# Patient Record
Sex: Male | Born: 1940 | Race: Black or African American | Hispanic: No | Marital: Married | Smoking: Former smoker
Health system: Southern US, Community
[De-identification: ages and names within clinical notes are randomized; demographics above are authoritative.]

## PROBLEM LIST (undated history)

## (undated) DIAGNOSIS — M5137 Other intervertebral disc degeneration, lumbosacral region: Secondary | ICD-10-CM

## (undated) DIAGNOSIS — I509 Heart failure, unspecified: Secondary | ICD-10-CM

## (undated) DIAGNOSIS — Z8711 Personal history of peptic ulcer disease: Secondary | ICD-10-CM

## (undated) DIAGNOSIS — K5730 Diverticulosis of large intestine without perforation or abscess without bleeding: Secondary | ICD-10-CM

## (undated) DIAGNOSIS — R011 Cardiac murmur, unspecified: Secondary | ICD-10-CM

## (undated) DIAGNOSIS — Z8719 Personal history of other diseases of the digestive system: Secondary | ICD-10-CM

## (undated) DIAGNOSIS — C61 Malignant neoplasm of prostate: Secondary | ICD-10-CM

## (undated) DIAGNOSIS — D696 Thrombocytopenia, unspecified: Secondary | ICD-10-CM

## (undated) DIAGNOSIS — F411 Generalized anxiety disorder: Secondary | ICD-10-CM

## (undated) DIAGNOSIS — R0609 Other forms of dyspnea: Secondary | ICD-10-CM

## (undated) DIAGNOSIS — M1990 Unspecified osteoarthritis, unspecified site: Secondary | ICD-10-CM

## (undated) DIAGNOSIS — B1710 Acute hepatitis C without hepatic coma: Secondary | ICD-10-CM

## (undated) DIAGNOSIS — F329 Major depressive disorder, single episode, unspecified: Secondary | ICD-10-CM

## (undated) DIAGNOSIS — R5381 Other malaise: Secondary | ICD-10-CM

## (undated) DIAGNOSIS — R5383 Other fatigue: Secondary | ICD-10-CM

## (undated) DIAGNOSIS — J189 Pneumonia, unspecified organism: Secondary | ICD-10-CM

## (undated) DIAGNOSIS — D649 Anemia, unspecified: Secondary | ICD-10-CM

## (undated) DIAGNOSIS — F1021 Alcohol dependence, in remission: Secondary | ICD-10-CM

## (undated) DIAGNOSIS — I10 Essential (primary) hypertension: Secondary | ICD-10-CM

## (undated) DIAGNOSIS — I739 Peripheral vascular disease, unspecified: Secondary | ICD-10-CM

## (undated) DIAGNOSIS — R062 Wheezing: Secondary | ICD-10-CM

## (undated) DIAGNOSIS — J309 Allergic rhinitis, unspecified: Secondary | ICD-10-CM

## (undated) DIAGNOSIS — E785 Hyperlipidemia, unspecified: Secondary | ICD-10-CM

## (undated) DIAGNOSIS — K8020 Calculus of gallbladder without cholecystitis without obstruction: Secondary | ICD-10-CM

## (undated) DIAGNOSIS — M545 Low back pain: Secondary | ICD-10-CM

## (undated) DIAGNOSIS — N529 Male erectile dysfunction, unspecified: Secondary | ICD-10-CM

## (undated) DIAGNOSIS — R7302 Impaired glucose tolerance (oral): Principal | ICD-10-CM

## (undated) DIAGNOSIS — N183 Chronic kidney disease, stage 3 unspecified: Secondary | ICD-10-CM

## (undated) HISTORY — DX: Impaired glucose tolerance (oral): R73.02

## (undated) HISTORY — DX: Unspecified osteoarthritis, unspecified site: M19.90

## (undated) HISTORY — PX: CATARACT EXTRACTION W/ INTRAOCULAR LENS  IMPLANT, BILATERAL: SHX1307

## (undated) HISTORY — DX: Allergic rhinitis, unspecified: J30.9

## (undated) HISTORY — DX: Hyperlipidemia, unspecified: E78.5

## (undated) HISTORY — DX: Acute hepatitis C without hepatic coma: B17.10

## (undated) HISTORY — DX: Male erectile dysfunction, unspecified: N52.9

## (undated) HISTORY — DX: Generalized anxiety disorder: F41.1

## (undated) HISTORY — DX: Low back pain: M54.5

## (undated) HISTORY — PX: PROSTATE BIOPSY: SHX241

## (undated) HISTORY — DX: Other fatigue: R53.83

## (undated) HISTORY — DX: Peripheral vascular disease, unspecified: I73.9

## (undated) HISTORY — DX: Major depressive disorder, single episode, unspecified: F32.9

## (undated) HISTORY — DX: Calculus of gallbladder without cholecystitis without obstruction: K80.20

## (undated) HISTORY — DX: Thrombocytopenia, unspecified: D69.6

## (undated) HISTORY — DX: Essential (primary) hypertension: I10

## (undated) HISTORY — DX: Other malaise: R53.81

## (undated) HISTORY — DX: Alcohol dependence, in remission: F10.21

## (undated) HISTORY — DX: Diverticulosis of large intestine without perforation or abscess without bleeding: K57.30

## (undated) HISTORY — DX: Other intervertebral disc degeneration, lumbosacral region: M51.37

## (undated) HISTORY — DX: Wheezing: R06.2

---

## 2001-03-26 HISTORY — PX: SHOULDER ARTHROSCOPY W/ ROTATOR CUFF REPAIR: SHX2400

## 2001-05-19 ENCOUNTER — Ambulatory Visit (HOSPITAL_BASED_OUTPATIENT_CLINIC_OR_DEPARTMENT_OTHER): Admission: RE | Admit: 2001-05-19 | Discharge: 2001-05-19 | Payer: Self-pay | Admitting: Orthopedic Surgery

## 2002-04-29 ENCOUNTER — Observation Stay (HOSPITAL_COMMUNITY): Admission: EM | Admit: 2002-04-29 | Discharge: 2002-04-30 | Payer: Self-pay | Admitting: Emergency Medicine

## 2002-04-30 ENCOUNTER — Encounter: Payer: Self-pay | Admitting: Cardiology

## 2004-01-27 ENCOUNTER — Ambulatory Visit: Payer: Self-pay | Admitting: Gastroenterology

## 2004-01-31 ENCOUNTER — Ambulatory Visit: Payer: Self-pay | Admitting: Gastroenterology

## 2004-02-04 ENCOUNTER — Encounter: Admission: RE | Admit: 2004-02-04 | Discharge: 2004-02-04 | Payer: Self-pay | Admitting: Gastroenterology

## 2004-02-10 ENCOUNTER — Ambulatory Visit: Payer: Self-pay | Admitting: Gastroenterology

## 2004-05-16 ENCOUNTER — Ambulatory Visit: Payer: Self-pay | Admitting: Internal Medicine

## 2004-08-18 ENCOUNTER — Ambulatory Visit: Payer: Self-pay | Admitting: Internal Medicine

## 2008-11-15 ENCOUNTER — Ambulatory Visit: Payer: Self-pay | Admitting: Oncology

## 2008-11-23 LAB — CBC WITH DIFFERENTIAL/PLATELET
BASO%: 0.2 % (ref 0.0–2.0)
Basophils Absolute: 0 10*3/uL (ref 0.0–0.1)
EOS%: 2.9 % (ref 0.0–7.0)
Eosinophils Absolute: 0.2 10*3/uL (ref 0.0–0.5)
HCT: 43.1 % (ref 38.4–49.9)
HGB: 14.4 g/dL (ref 13.0–17.1)
LYMPH%: 47.5 % (ref 14.0–49.0)
MCH: 28.5 pg (ref 27.2–33.4)
MCHC: 33.4 g/dL (ref 32.0–36.0)
MCV: 85.3 fL (ref 79.3–98.0)
MONO#: 0.6 10*3/uL (ref 0.1–0.9)
MONO%: 12.2 % (ref 0.0–14.0)
NEUT#: 1.9 10*3/uL (ref 1.5–6.5)
NEUT%: 37.2 % — ABNORMAL LOW (ref 39.0–75.0)
Platelets: 73 10*3/uL — ABNORMAL LOW (ref 140–400)
RBC: 5.05 10*6/uL (ref 4.20–5.82)
RDW: 13.2 % (ref 11.0–14.6)
WBC: 5.1 10*3/uL (ref 4.0–10.3)
lymph#: 2.4 10*3/uL (ref 0.9–3.3)
nRBC: 0 % (ref 0–0)

## 2008-11-23 LAB — COMPREHENSIVE METABOLIC PANEL
ALT: 57 U/L — ABNORMAL HIGH (ref 0–53)
AST: 51 U/L — ABNORMAL HIGH (ref 0–37)
Albumin: 3.8 g/dL (ref 3.5–5.2)
Alkaline Phosphatase: 57 U/L (ref 39–117)
BUN: 26 mg/dL — ABNORMAL HIGH (ref 6–23)
CO2: 23 mEq/L (ref 19–32)
Calcium: 9.9 mg/dL (ref 8.4–10.5)
Chloride: 104 mEq/L (ref 96–112)
Creatinine, Ser: 1.62 mg/dL — ABNORMAL HIGH (ref 0.40–1.50)
Glucose, Bld: 107 mg/dL — ABNORMAL HIGH (ref 70–99)
Potassium: 3.9 mEq/L (ref 3.5–5.3)
Sodium: 137 mEq/L (ref 135–145)
Total Bilirubin: 0.4 mg/dL (ref 0.3–1.2)
Total Protein: 8 g/dL (ref 6.0–8.3)

## 2008-11-23 LAB — CHCC SMEAR

## 2008-11-23 LAB — LACTATE DEHYDROGENASE: LDH: 138 U/L (ref 94–250)

## 2008-12-02 ENCOUNTER — Ambulatory Visit (HOSPITAL_COMMUNITY): Admission: RE | Admit: 2008-12-02 | Discharge: 2008-12-02 | Payer: Self-pay | Admitting: Oncology

## 2008-12-22 ENCOUNTER — Ambulatory Visit: Payer: Self-pay | Admitting: Oncology

## 2008-12-24 LAB — PROTHROMBIN TIME
INR: 1.1 (ref 0.0–1.5)
Prothrombin Time: 14.2 seconds (ref 11.6–15.2)

## 2008-12-24 LAB — FIBRINOGEN: Fibrinogen: 338 mg/dL (ref 204–475)

## 2008-12-24 LAB — APTT: aPTT: 31 seconds (ref 24–37)

## 2009-01-13 LAB — HM COLONOSCOPY: HM Colonoscopy: ABNORMAL

## 2009-01-25 ENCOUNTER — Emergency Department (HOSPITAL_COMMUNITY): Admission: EM | Admit: 2009-01-25 | Discharge: 2009-01-25 | Payer: Self-pay | Admitting: Emergency Medicine

## 2009-03-24 ENCOUNTER — Ambulatory Visit: Payer: Self-pay | Admitting: Oncology

## 2009-03-28 ENCOUNTER — Ambulatory Visit: Payer: Self-pay | Admitting: Internal Medicine

## 2009-03-28 DIAGNOSIS — M1990 Unspecified osteoarthritis, unspecified site: Secondary | ICD-10-CM | POA: Insufficient documentation

## 2009-03-28 DIAGNOSIS — M5137 Other intervertebral disc degeneration, lumbosacral region: Secondary | ICD-10-CM

## 2009-03-28 DIAGNOSIS — F329 Major depressive disorder, single episode, unspecified: Secondary | ICD-10-CM

## 2009-03-28 DIAGNOSIS — B1710 Acute hepatitis C without hepatic coma: Secondary | ICD-10-CM

## 2009-03-28 DIAGNOSIS — M51379 Other intervertebral disc degeneration, lumbosacral region without mention of lumbar back pain or lower extremity pain: Secondary | ICD-10-CM

## 2009-03-28 DIAGNOSIS — E785 Hyperlipidemia, unspecified: Secondary | ICD-10-CM

## 2009-03-28 DIAGNOSIS — F1021 Alcohol dependence, in remission: Secondary | ICD-10-CM

## 2009-03-28 DIAGNOSIS — N183 Chronic kidney disease, stage 3 (moderate): Secondary | ICD-10-CM

## 2009-03-28 DIAGNOSIS — K5730 Diverticulosis of large intestine without perforation or abscess without bleeding: Secondary | ICD-10-CM | POA: Insufficient documentation

## 2009-03-28 DIAGNOSIS — R5381 Other malaise: Secondary | ICD-10-CM

## 2009-03-28 DIAGNOSIS — K8020 Calculus of gallbladder without cholecystitis without obstruction: Secondary | ICD-10-CM | POA: Insufficient documentation

## 2009-03-28 DIAGNOSIS — J309 Allergic rhinitis, unspecified: Secondary | ICD-10-CM

## 2009-03-28 DIAGNOSIS — I110 Hypertensive heart disease with heart failure: Secondary | ICD-10-CM

## 2009-03-28 DIAGNOSIS — M545 Low back pain, unspecified: Secondary | ICD-10-CM

## 2009-03-28 DIAGNOSIS — I739 Peripheral vascular disease, unspecified: Secondary | ICD-10-CM

## 2009-03-28 DIAGNOSIS — D696 Thrombocytopenia, unspecified: Secondary | ICD-10-CM | POA: Insufficient documentation

## 2009-03-28 DIAGNOSIS — F32A Depression, unspecified: Secondary | ICD-10-CM | POA: Insufficient documentation

## 2009-03-28 DIAGNOSIS — R5383 Other fatigue: Secondary | ICD-10-CM

## 2009-03-28 DIAGNOSIS — B1920 Unspecified viral hepatitis C without hepatic coma: Secondary | ICD-10-CM | POA: Insufficient documentation

## 2009-03-28 DIAGNOSIS — F411 Generalized anxiety disorder: Secondary | ICD-10-CM | POA: Insufficient documentation

## 2009-03-28 DIAGNOSIS — I10 Essential (primary) hypertension: Secondary | ICD-10-CM

## 2009-03-28 DIAGNOSIS — F3289 Other specified depressive episodes: Secondary | ICD-10-CM

## 2009-03-28 HISTORY — DX: Other intervertebral disc degeneration, lumbosacral region without mention of lumbar back pain or lower extremity pain: M51.379

## 2009-03-28 HISTORY — DX: Allergic rhinitis, unspecified: J30.9

## 2009-03-28 HISTORY — DX: Thrombocytopenia, unspecified: D69.6

## 2009-03-28 HISTORY — DX: Essential (primary) hypertension: I10

## 2009-03-28 HISTORY — DX: Acute hepatitis C without hepatic coma: B17.10

## 2009-03-28 HISTORY — DX: Peripheral vascular disease, unspecified: I73.9

## 2009-03-28 HISTORY — DX: Other specified depressive episodes: F32.89

## 2009-03-28 HISTORY — DX: Low back pain, unspecified: M54.50

## 2009-03-28 HISTORY — DX: Unspecified osteoarthritis, unspecified site: M19.90

## 2009-03-28 HISTORY — DX: Alcohol dependence, in remission: F10.21

## 2009-03-28 HISTORY — DX: Diverticulosis of large intestine without perforation or abscess without bleeding: K57.30

## 2009-03-28 HISTORY — DX: Major depressive disorder, single episode, unspecified: F32.9

## 2009-03-28 HISTORY — DX: Hyperlipidemia, unspecified: E78.5

## 2009-03-28 HISTORY — DX: Calculus of gallbladder without cholecystitis without obstruction: K80.20

## 2009-03-28 HISTORY — DX: Generalized anxiety disorder: F41.1

## 2009-03-28 HISTORY — DX: Other intervertebral disc degeneration, lumbosacral region: M51.37

## 2009-03-28 HISTORY — DX: Other malaise: R53.81

## 2009-03-28 LAB — CONVERTED CEMR LAB
ALT: 66 units/L — ABNORMAL HIGH (ref 0–53)
AST: 68 units/L — ABNORMAL HIGH (ref 0–37)
Albumin: 3.6 g/dL (ref 3.5–5.2)
Alkaline Phosphatase: 58 units/L (ref 39–117)
BUN: 30 mg/dL — ABNORMAL HIGH (ref 6–23)
Basophils Absolute: 0 10*3/uL (ref 0.0–0.1)
Basophils Relative: 0.1 % (ref 0.0–3.0)
Bilirubin Urine: NEGATIVE
Bilirubin, Direct: 0.2 mg/dL (ref 0.0–0.3)
CO2: 29 meq/L (ref 19–32)
Calcium: 10.5 mg/dL (ref 8.4–10.5)
Chloride: 102 meq/L (ref 96–112)
Cholesterol: 124 mg/dL (ref 0–200)
Creatinine, Ser: 1.9 mg/dL — ABNORMAL HIGH (ref 0.4–1.5)
Direct LDL: 54.1 mg/dL
Eosinophils Absolute: 0.2 10*3/uL (ref 0.0–0.7)
Eosinophils Relative: 2.6 % (ref 0.0–5.0)
Folate: 17.9 ng/mL
GFR calc non Af Amer: 45.33 mL/min (ref >60)
Glucose, Bld: 98 mg/dL (ref 70–99)
HCT: 43.9 % (ref 39.0–52.0)
HDL: 22.9 mg/dL — ABNORMAL LOW (ref >39.00)
Hemoglobin: 14.4 g/dL (ref 13.0–17.0)
Iron: 161 ug/dL (ref 42–165)
Ketones, ur: NEGATIVE mg/dL
Leukocytes, UA: NEGATIVE
Lymphocytes Relative: 38.3 % (ref 12.0–46.0)
Lymphs Abs: 2.3 10*3/uL (ref 0.7–4.0)
MCHC: 32.7 g/dL (ref 30.0–36.0)
MCV: 88.1 fL (ref 78.0–100.0)
Monocytes Absolute: 0.7 10*3/uL (ref 0.1–1.0)
Monocytes Relative: 11.7 % (ref 3.0–12.0)
Neutro Abs: 2.9 10*3/uL (ref 1.4–7.7)
Neutrophils Relative %: 47.3 % (ref 43.0–77.0)
Nitrite: NEGATIVE
Platelets: 99 10*3/uL — ABNORMAL LOW (ref 150.0–400.0)
Potassium: 4.1 meq/L (ref 3.5–5.1)
RBC: 4.99 M/uL (ref 4.22–5.81)
RDW: 13.1 % (ref 11.5–14.6)
Saturation Ratios: 37.9 % (ref 20.0–50.0)
Sed Rate: 43 mm/hr — ABNORMAL HIGH (ref 0–22)
Sodium: 139 meq/L (ref 135–145)
Specific Gravity, Urine: 1.025 (ref 1.000–1.030)
TSH: 1.3 microintl units/mL (ref 0.35–5.50)
Total Bilirubin: 0.7 mg/dL (ref 0.3–1.2)
Total CHOL/HDL Ratio: 5
Total Protein, Urine: NEGATIVE mg/dL
Total Protein: 8.9 g/dL — ABNORMAL HIGH (ref 6.0–8.3)
Transferrin: 303.3 mg/dL (ref 212.0–360.0)
Triglycerides: 217 mg/dL — ABNORMAL HIGH (ref 0.0–149.0)
Urine Glucose: NEGATIVE mg/dL
Urobilinogen, UA: 0.2 (ref 0.0–1.0)
VLDL: 43.4 mg/dL — ABNORMAL HIGH (ref 0.0–40.0)
Vitamin B-12: 635 pg/mL (ref 211–911)
WBC: 6.1 10*3/uL (ref 4.5–10.5)
pH: 5.5 (ref 5.0–8.0)

## 2009-03-29 LAB — CONVERTED CEMR LAB: Vit D, 25-Hydroxy: 32 ng/mL (ref 30–89)

## 2009-04-04 ENCOUNTER — Telehealth: Payer: Self-pay | Admitting: Internal Medicine

## 2009-04-08 ENCOUNTER — Telehealth: Payer: Self-pay | Admitting: Internal Medicine

## 2009-04-25 ENCOUNTER — Telehealth: Payer: Self-pay | Admitting: Internal Medicine

## 2009-07-18 ENCOUNTER — Telehealth: Payer: Self-pay | Admitting: Internal Medicine

## 2009-07-25 ENCOUNTER — Telehealth: Payer: Self-pay | Admitting: Internal Medicine

## 2009-07-25 ENCOUNTER — Encounter: Payer: Self-pay | Admitting: Internal Medicine

## 2009-09-28 ENCOUNTER — Ambulatory Visit: Payer: Self-pay | Admitting: Internal Medicine

## 2009-09-28 DIAGNOSIS — N529 Male erectile dysfunction, unspecified: Secondary | ICD-10-CM

## 2009-09-28 HISTORY — DX: Male erectile dysfunction, unspecified: N52.9

## 2009-09-28 LAB — CONVERTED CEMR LAB
BUN: 33 mg/dL — ABNORMAL HIGH (ref 6–23)
Basophils Absolute: 0 10*3/uL (ref 0.0–0.1)
Basophils Relative: 0.6 % (ref 0.0–3.0)
CO2: 29 meq/L (ref 19–32)
Calcium: 9.6 mg/dL (ref 8.4–10.5)
Chloride: 106 meq/L (ref 96–112)
Creatinine, Ser: 1.9 mg/dL — ABNORMAL HIGH (ref 0.4–1.5)
Eosinophils Absolute: 0.2 10*3/uL (ref 0.0–0.7)
Eosinophils Relative: 2.9 % (ref 0.0–5.0)
GFR calc non Af Amer: 46.39 mL/min (ref >60)
Glucose, Bld: 96 mg/dL (ref 70–99)
HCT: 44.3 % (ref 39.0–52.0)
Hemoglobin: 15.1 g/dL (ref 13.0–17.0)
Lymphocytes Relative: 46.9 % — ABNORMAL HIGH (ref 12.0–46.0)
Lymphs Abs: 2.5 10*3/uL (ref 0.7–4.0)
MCHC: 34 g/dL (ref 30.0–36.0)
MCV: 87.8 fL (ref 78.0–100.0)
Monocytes Absolute: 0.6 10*3/uL (ref 0.1–1.0)
Monocytes Relative: 11.9 % (ref 3.0–12.0)
Neutro Abs: 2 10*3/uL (ref 1.4–7.7)
Neutrophils Relative %: 37.7 % — ABNORMAL LOW (ref 43.0–77.0)
Platelets: 82 10*3/uL — ABNORMAL LOW (ref 150.0–400.0)
Potassium: 4.3 meq/L (ref 3.5–5.1)
RBC: 5.05 M/uL (ref 4.22–5.81)
RDW: 14.1 % (ref 11.5–14.6)
Sodium: 142 meq/L (ref 135–145)
WBC: 5.4 10*3/uL (ref 4.5–10.5)

## 2010-01-16 ENCOUNTER — Telehealth: Payer: Self-pay | Admitting: Internal Medicine

## 2010-02-06 ENCOUNTER — Telehealth: Payer: Self-pay | Admitting: Internal Medicine

## 2010-02-07 ENCOUNTER — Ambulatory Visit: Payer: Self-pay | Admitting: Internal Medicine

## 2010-02-07 DIAGNOSIS — R062 Wheezing: Secondary | ICD-10-CM

## 2010-02-07 HISTORY — DX: Wheezing: R06.2

## 2010-03-23 ENCOUNTER — Telehealth: Payer: Self-pay | Admitting: Internal Medicine

## 2010-04-04 ENCOUNTER — Ambulatory Visit
Admission: RE | Admit: 2010-04-04 | Discharge: 2010-04-04 | Payer: Self-pay | Source: Home / Self Care | Attending: Internal Medicine | Admitting: Internal Medicine

## 2010-04-04 ENCOUNTER — Encounter: Payer: Self-pay | Admitting: Internal Medicine

## 2010-04-04 ENCOUNTER — Other Ambulatory Visit: Payer: Self-pay | Admitting: Internal Medicine

## 2010-04-04 LAB — CBC WITH DIFFERENTIAL/PLATELET
Basophils Absolute: 0 10*3/uL (ref 0.0–0.1)
Basophils Relative: 0.5 % (ref 0.0–3.0)
Eosinophils Absolute: 0.2 10*3/uL (ref 0.0–0.7)
Eosinophils Relative: 4.3 % (ref 0.0–5.0)
HCT: 43.9 % (ref 39.0–52.0)
Hemoglobin: 14.6 g/dL (ref 13.0–17.0)
Lymphocytes Relative: 56.2 % — ABNORMAL HIGH (ref 12.0–46.0)
Lymphs Abs: 2.9 10*3/uL (ref 0.7–4.0)
MCHC: 33.3 g/dL (ref 30.0–36.0)
MCV: 89.3 fl (ref 78.0–100.0)
Monocytes Absolute: 0.7 10*3/uL (ref 0.1–1.0)
Monocytes Relative: 13.4 % — ABNORMAL HIGH (ref 3.0–12.0)
Neutro Abs: 1.3 10*3/uL — ABNORMAL LOW (ref 1.4–7.7)
Neutrophils Relative %: 25.6 % — ABNORMAL LOW (ref 43.0–77.0)
Platelets: 120 10*3/uL — ABNORMAL LOW (ref 150.0–400.0)
RBC: 4.91 Mil/uL (ref 4.22–5.81)
RDW: 14.2 % (ref 11.5–14.6)
WBC: 5.2 10*3/uL (ref 4.5–10.5)

## 2010-04-04 LAB — HEPATIC FUNCTION PANEL
ALT: 80 U/L — ABNORMAL HIGH (ref 0–53)
AST: 91 U/L — ABNORMAL HIGH (ref 0–37)
Albumin: 3.6 g/dL (ref 3.5–5.2)
Alkaline Phosphatase: 64 U/L (ref 39–117)
Bilirubin, Direct: 0.2 mg/dL (ref 0.0–0.3)
Total Bilirubin: 0.8 mg/dL (ref 0.3–1.2)
Total Protein: 8.2 g/dL (ref 6.0–8.3)

## 2010-04-04 LAB — URINALYSIS
Bilirubin Urine: NEGATIVE
Ketones, ur: NEGATIVE
Leukocytes, UA: NEGATIVE
Nitrite: NEGATIVE
Specific Gravity, Urine: 1.02 (ref 1.000–1.030)
Total Protein, Urine: NEGATIVE
Urine Glucose: NEGATIVE
Urobilinogen, UA: 0.2 (ref 0.0–1.0)
pH: 5.5 (ref 5.0–8.0)

## 2010-04-04 LAB — BASIC METABOLIC PANEL
BUN: 32 mg/dL — ABNORMAL HIGH (ref 6–23)
CO2: 33 mEq/L — ABNORMAL HIGH (ref 19–32)
Calcium: 10 mg/dL (ref 8.4–10.5)
Chloride: 104 mEq/L (ref 96–112)
Creatinine, Ser: 1.9 mg/dL — ABNORMAL HIGH (ref 0.4–1.5)
GFR: 45.75 mL/min — ABNORMAL LOW (ref >60.00)
Glucose, Bld: 88 mg/dL (ref 70–99)
Potassium: 4.2 mEq/L (ref 3.5–5.1)
Sodium: 143 mEq/L (ref 135–145)

## 2010-04-04 LAB — LIPID PANEL
Cholesterol: 117 mg/dL (ref 0–200)
HDL: 21.9 mg/dL — ABNORMAL LOW (ref >39.00)
Total CHOL/HDL Ratio: 5
Triglycerides: 240 mg/dL — ABNORMAL HIGH (ref 0.0–149.0)
VLDL: 48 mg/dL — ABNORMAL HIGH (ref 0.0–40.0)

## 2010-04-04 LAB — TSH: TSH: 2.52 u[IU]/mL (ref 0.35–5.50)

## 2010-04-04 LAB — PSA: PSA: 2.46 ng/mL (ref 0.10–4.00)

## 2010-04-04 LAB — LDL CHOLESTEROL, DIRECT: Direct LDL: 48.5 mg/dL

## 2010-04-25 NOTE — Progress Notes (Signed)
  Phone Note Refill Request  on Jul 25, 2009 10:39 AM  Refills Requested: Medication #1:  METOPROLOL SUCCINATE 100 MG XR24H-TAB 1/2 by mouth two times a day   Dosage confirmed as above?Dosage Confirmed   Notes: [facility] Initial call taken by: Scharlene Gloss,  Jul 25, 2009 10:39 AM    Prescriptions: METOPROLOL SUCCINATE 100 MG XR24H-TAB (METOPROLOL SUCCINATE) 1/2 by mouth two times a day  #100 x 3   Entered by:   Scharlene Gloss   Authorized by:   Corwin Levins MD   Signed by:   Scharlene Gloss on 07/25/2009   Method used:   Faxed to ...       [facility] (retail)       [address]       [place], [place]  [id number]       Ph: [phone]       Fax: [phone]   RxID:   [id number]

## 2010-04-25 NOTE — Medication Information (Signed)
Summary: Metoprolol/[facility]  Metoprolol/[facility]   Imported By: Sherian Rein 07/27/2009 10:30:27  _____________________________________________________________________  External Attachment:    Type:   Image     Comment:   External Document

## 2010-04-25 NOTE — Assessment & Plan Note (Signed)
Summary: COUGH   STC   Vital Signs:  Patient profile:   70 year old male Height:      72 inches Weight:      202.50 pounds BMI:     27.56 O2 Sat:      96 % on Room air Temp:     98.2 degrees F oral Pulse rate:   57 / minute BP sitting:   144 / 82  (left arm) Cuff size:   large  Vitals Entered By: Zella Ball Ewing CMA Duncan Dull) (February 07, 2010 1:57 PM)  O2 Flow:  Room air CC: Cough and congestion, flu shot/RE   CC:  Cough and congestion and flu shot/RE.  History of Present Illness: here with acute osnet 3 day fever, ST, headache, general weakness and malasie, wtih prod cough greenish sputum, as well as onset today with midl wheezing as well;  Pt denies CP, orthopnea, pnd, worsening LE edema, palps, dizziness or syncope Pt denies new neuro symptoms such as headache, facial or extremity weakness  Pt denies polydipsia, polyuria.    Overall good compliance with meds, trying to follow low chol diet, wt stable, little excercise however Due for flu shot today.  No recent wt loss, night sweats, loss of appetite or other constitutional symptoms .  Overall good compliance with meds, and good tolerability.   Preventive Screening-Counseling & Management      Drug Use:  no.    Problems Prior to Update: 1)  Wheezing  (ICD-786.07) 2)  Bronchitis-acute  (ICD-466.0) 3)  Erectile Dysfunction, Organic  (ICD-607.84) 4)  Fatigue  (ICD-780.79) 5)  Thrombocytopenia  (ICD-287.5) 6)  Peripheral Vascular Disease  (ICD-443.9) 7)  Diverticulosis, Colon  (ICD-562.10) 8)  Renal Insufficiency  (ICD-588.9) 9)  Cholelithiasis  (ICD-574.20) 10)  Hypertension  (ICD-401.9) 11)  Hyperlipidemia  (ICD-272.4) 12)  Hepatitis C  (ICD-070.51) 13)  Depression  (ICD-311) 14)  Degenerative Joint Disease  (ICD-715.90) 15)  Allergic Rhinitis  (ICD-477.9) 16)  Personal History of Alcoholism  (ICD-V11.3) 17)  Anxiety  (ICD-300.00) 18)  Low Back Pain, Chronic  (ICD-724.2) 19)  Disc Disease, Lumbar   (ICD-722.52)  Medications Prior to Update: 1)  Hydrochlorothiazide 25 Mg Tabs (Hydrochlorothiazide) .Marland Kitchen.. 1 By Mouth Once Daily 2)  Clorazepate Dipotassium 7.5 Mg Tabs (Clorazepate Dipotassium) .Marland Kitchen.. 1 By Mouth Three Times A Day As Needed 3)  Lisinopril 10 Mg Tabs (Lisinopril) .Marland Kitchen.. 1 By Mouth Once Daily 4)  Metoprolol Tartrate 100 Mg Tabs (Metoprolol Tartrate) .... 1/2 By Mouth Two Times A Day 5)  Ibuprofen 800 Mg Tabs (Ibuprofen) .... 2 By Mouth Daily As Needed 6)  Trazodone Hcl 50 Mg Tabs (Trazodone Hcl) .Marland Kitchen.. 1 By Mouth At Bedtime 7)  Tramadol Hcl 50 Mg Tabs (Tramadol Hcl) .Marland Kitchen.. 1 By Mouth Q 6 Hrs As Needed 8)  Cialis 20 Mg Tabs (Tadalafil) .Marland Kitchen.. 1 By Mouth Every Other Day As Needed 9)  Fexofenadine Hcl 180 Mg Tabs (Fexofenadine Hcl) .Marland Kitchen.. 1 By Mouth Once Daily As Needed 10)  Oxycodone-Acetaminophen 5-325 Mg Tabs (Oxycodone-Acetaminophen) .Marland Kitchen.. 1 By Mouth Once Daily As Needed Breakthrough Pain  Current Medications (verified): 1)  Hydrochlorothiazide 25 Mg Tabs (Hydrochlorothiazide) .Marland Kitchen.. 1 By Mouth Once Daily 2)  Clorazepate Dipotassium 7.5 Mg Tabs (Clorazepate Dipotassium) .Marland Kitchen.. 1 By Mouth Three Times A Day As Needed 3)  Lisinopril 10 Mg Tabs (Lisinopril) .Marland Kitchen.. 1 By Mouth Once Daily 4)  Metoprolol Tartrate 100 Mg Tabs (Metoprolol Tartrate) .... 1/2 By Mouth Two Times A Day 5)  Ibuprofen 800 Mg  Tabs (Ibuprofen) .... 2 By Mouth Daily As Needed 6)  Trazodone Hcl 50 Mg Tabs (Trazodone Hcl) .Marland Kitchen.. 1 By Mouth At Bedtime 7)  Tramadol Hcl 50 Mg Tabs (Tramadol Hcl) .Marland Kitchen.. 1 By Mouth Q 6 Hrs As Needed 8)  Cialis 20 Mg Tabs (Tadalafil) .Marland Kitchen.. 1 By Mouth Every Other Day As Needed 9)  Fexofenadine Hcl 180 Mg Tabs (Fexofenadine Hcl) .Marland Kitchen.. 1 By Mouth Once Daily As Needed 10)  Oxycodone-Acetaminophen 5-325 Mg Tabs (Oxycodone-Acetaminophen) .Marland Kitchen.. 1 By Mouth Once Daily As Needed Breakthrough Pain 11)  Azithromycin 250 Mg Tabs (Azithromycin) .... 2po Qd For 1 Day, Then 1po Qd For 4days, Then Stop 12)  Tessalon Perles 100  Mg Caps (Benzonatate) .Marland Kitchen.. 1-2 Tabs By Mouth Three Times A Day As Needed Cough 13)  Prednisone 10 Mg Tabs (Prednisone) .... 3po Qd For 3days, Then 2po Qd For 3days, Then 1po Qd For 3days, Then Stop  Allergies (verified): No Known Drug Allergies  Past History:  Past Medical History: Last updated: 09/28/2009 HEPATITIS C (ICD-070.51)  - saw [facility] but refused liver bx and declines to return DEPRESSION (ICD-311) DEGENERATIVE JOINT DISEASE (ICD-715.90) - left hip, bilat ankles, right shoudler ALLERGIC RHINITIS (ICD-477.9) PERSONAL HISTORY OF ALCOHOLISM (ICD-V11.3) ANXIETY (ICD-300.00) LOW BACK PAIN, CHRONIC (ICD-724.2) DISC DISEASE, LUMBAR (ICD-722.52)  - DDD Hyperlipidemia Hypertension gallstones Renal insufficiency - chronic Diverticulosis, colon Peripheral vascular disease  - Ca+2 vascular on CT abd 2005 thrombocytopenia - dr Clelia Croft but does not plan to return soon erectile dysfunction  Past Surgical History: Last updated: 09/28/2009 s/p right shoulder surgury 2003 [facility]  Social History: Last updated: 02/07/2010 Married 2 children 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  , mild ill  Head:  normocephalic and atraumatic.   Eyes:  vision grossly intact, pupils equal, and pupils round.   Ears:  bilat tm's red, sinus nontender Nose:  nasal dischargemucosal pallor and mucosal edema.   Mouth:  pharyngeal erythema and fair dentition.   Neck:  supple and no masses.   Lungs:  normal respiratory effort, R decreased breath sounds, R wheezes, L decreased  breath sounds, and L wheezes.   Heart:  normal rate and regular rhythm.   Extremities:  no edema, no erythema    Impression & Recommendations:  Problem # 1:  BRONCHITIS-ACUTE (ICD-466.0)  Orders: T-2 View CXR, Same Day (71020.5TC)  His updated medication list for this problem includes:    Azithromycin 250 Mg Tabs (Azithromycin) .Marland Kitchen... 2po qd for 1 day, then 1po qd for 4days, then stop    Tessalon Perles 100 Mg Caps (Benzonatate) .Marland Kitchen... 1-2 tabs by mouth three times a day as needed cough treat as above, f/u any worsening signs or symptoms , cant r/o pna - for cxr today as well   Problem # 2:  WHEEZING (ICD-786.07) mild , has hx of smoking, no dx of copd, for predpack for home - liekly due to above  Problem # 3:  HYPERTENSION (ICD-401.9)  His updated medication list for this problem includes:  Hydrochlorothiazide 25 Mg Tabs (Hydrochlorothiazide) .Marland Kitchen... 1 by mouth once daily    Lisinopril 10 Mg Tabs (Lisinopril) .Marland Kitchen... 1 by mouth once daily    Metoprolol Tartrate 100 Mg Tabs (Metoprolol tartrate) .Marland Kitchen... 1/2 by mouth two times a day mild elev today, likely situational, ok to follow, continue same treatment   BP today: 144/82 Prior BP: 122/80 (09/28/2009)  Labs Reviewed: K+: 4.3 (09/28/2009) Creat: : 1.9 (09/28/2009)   Chol: 124 (03/28/2009)   HDL: 22.90 (03/28/2009)   TG: 217.0 (03/28/2009)  Complete Medication List: 1)  Hydrochlorothiazide 25 Mg Tabs (Hydrochlorothiazide) .Marland Kitchen.. 1 by mouth once daily 2)  Clorazepate Dipotassium 7.5 Mg Tabs (Clorazepate dipotassium) .Marland Kitchen.. 1 by mouth three times a day as needed 3)  Lisinopril 10 Mg Tabs (Lisinopril) .Marland Kitchen.. 1 by mouth once daily 4)  Metoprolol Tartrate 100 Mg Tabs (Metoprolol tartrate) .... 1/2 by mouth two times a day 5)  Ibuprofen 800 Mg Tabs (Ibuprofen) .... 2 by mouth daily as needed 6)  Trazodone Hcl 50 Mg Tabs (Trazodone hcl) .Marland Kitchen.. 1 by mouth at bedtime 7)  Tramadol Hcl 50 Mg Tabs (Tramadol hcl) .Marland Kitchen.. 1 by mouth q 6 hrs as needed 8)   Cialis 20 Mg Tabs (Tadalafil) .Marland Kitchen.. 1 by mouth every other day as needed 9)  Fexofenadine Hcl 180 Mg Tabs (Fexofenadine hcl) .Marland Kitchen.. 1 by mouth once daily as needed 10)  Oxycodone-acetaminophen 5-325 Mg Tabs (Oxycodone-acetaminophen) .Marland Kitchen.. 1 by mouth once daily as needed breakthrough pain 11)  Azithromycin 250 Mg Tabs (Azithromycin) .... 2po qd for 1 day, then 1po qd for 4days, then stop 12)  Tessalon Perles 100 Mg Caps (Benzonatate) .Marland Kitchen.. 1-2 tabs by mouth three times a day as needed cough 13)  Prednisone 10 Mg Tabs (Prednisone) .... 3po qd for 3days, then 2po qd for 3days, then 1po qd for 3days, then stop  Other Orders: Flu Vaccine 66yrs + MEDICARE PATIENTS (Z6109) Administration Flu vaccine - MCR (U0454)  Patient Instructions: 1)  you had the flu shot today 2)  Please take all new medications as prescribed 3)  Continue all previous medications as before this visit  4)  Please go to Radiology in the basement level for your X-Ray today  5)  Please call the number on the [facility] Card for results of your testing  6)  Please schedule a follow-up appointment in 2 months. Prescriptions: PREDNISONE 10 MG TABS (PREDNISONE) 3po qd for 3days, then 2po qd for 3days, then 1po qd for 3days, then stop  #18 x 0   Entered and Authorized by:   Corwin Levins MD   Signed by:   Corwin Levins MD on 02/07/2010   Method used:   Print then Give to Patient   RxID:   [id number] TESSALON PERLES 100 MG CAPS (BENZONATATE) 1-2 tabs by mouth three times a day as needed cough  #60 x 1   Entered and Authorized by:   Corwin Levins MD   Signed by:   Corwin Levins MD on 02/07/2010   Method used:   Print then Give to Patient   RxID:   [id number] AZITHROMYCIN 250 MG TABS (AZITHROMYCIN) 2po qd for 1 day, then 1po qd for 4days, then stop  #6 x 1   Entered and Authorized by:   Corwin Levins MD   Signed by:   Corwin Levins MD on 02/07/2010   Method used:   Print then Give to Patient   RxID:   [id number]    Orders  Added: 1)  Flu Vaccine 56yrs + MEDICARE PATIENTS [Q2039] 2)  Administration Flu vaccine - MCR [G0008] 3)  T-2 View CXR, Same Day [71020.5TC] 4)  Est. Patient Level IV [04540]   Flu Vaccine Consent Questions     Do you have a history of severe allergic reactions to this vaccine? no    Any prior history of allergic reactions to egg and/or gelatin? no    Do you have a sensitivity to the preservative Thimersol? no    Do you have a past history of Guillan-Barre Syndrome? no    Do you currently have an acute febrile illness? no    Have you ever had a severe reaction to latex? no    Vaccine information given and explained to patient? yes    Are you currently pregnant? no    Lot Number:[id number]   Exp Date:09/23/2010   Site Given  Left Deltoid [id number]

## 2010-04-25 NOTE — Progress Notes (Signed)
  Phone Note Refill Request  on April 08, 2009 4:50 PM  Refills Requested: Medication #1:  HYDROCHLOROTHIAZIDE 25 MG TABS 1 by mouth once daily   Dosage confirmed as above?Dosage Confirmed   Notes: [facility] Initial call taken by: Scharlene Gloss,  April 08, 2009 4:50 PM    Prescriptions: HYDROCHLOROTHIAZIDE 25 MG TABS (HYDROCHLOROTHIAZIDE) 1 by mouth once daily  #100 x 2   Entered by:   Scharlene Gloss   Authorized by:   Corwin Levins MD   Signed by:   Scharlene Gloss on 04/08/2009   Method used:   Faxed to ...       [facility] (retail)       [address]       [place], [place]  [address]       Ph: [phone]       Fax: [phone]   RxID:   [id number]

## 2010-04-25 NOTE — Progress Notes (Signed)
  Phone Note Refill Request Message from:  Fax from Pharmacy on February 06, 2010 11:03 AM  Refills Requested: Medication #1:  HYDROCHLOROTHIAZIDE 25 MG TABS 1 by mouth once daily   Dosage confirmed as above?Dosage Confirmed   Last Refilled: 04/08/2009   Notes: [facility] Initial call taken by: [name] Ewing CMA (AAMA),  February 06, 2010 11:03 AM    Prescriptions: HYDROCHLOROTHIAZIDE 25 MG TABS (HYDROCHLOROTHIAZIDE) 1 by mouth once daily  #100 x 3   Entered by:   Scharlene Gloss CMA (AAMA)   Authorized by:   Corwin Levins MD   Signed by:   Scharlene Gloss CMA (AAMA) on 02/06/2010   Method used:   Faxed to ...       [facility] (retail)       [address]       [place], [place]  [id number]       Ph: [phone]       Fax: [phone]   RxID:   [id number]

## 2010-04-25 NOTE — Progress Notes (Signed)
Summary: med refill  Phone Note Refill Request  on April 08, 2009 4:54 PM  Refills Requested: Medication #1:  TRAZODONE HCL 50 MG TABS 1 by mouth at bedtime   Dosage confirmed as above?Dosage Confirmed   Notes: [facility] [phone] Initial call taken by: Scharlene Gloss,  April 08, 2009 4:55 PM  Follow-up for Phone Call        done hardcopy to [facility] side B - dahlia  Follow-up by: Corwin Levins MD,  April 08, 2009 5:47 PM  Additional Follow-up for Phone Call Additional follow up Details #1::        faxed to pharmacy Additional Follow-up by: Margaret Pyle, CMA,  April 11, 2009 7:58 AM    Prescriptions: TRAZODONE HCL 50 MG TABS (TRAZODONE HCL) 1 by mouth at bedtime  #90 x 1   Entered and Authorized by:   Corwin Levins MD   Signed by:   Corwin Levins MD on 04/08/2009   Method used:   Print then Give to Patient   RxID:   [id number]

## 2010-04-25 NOTE — Assessment & Plan Note (Signed)
Summary: NEW/ MEDICARE/ [employer] /NWS #   Vital Signs:  Patient profile:   70 year old male Height:      71 inches Weight:      191 pounds BMI:     26.74 O2 Sat:      97 % on Room air Temp:     97.9 degrees F oral Pulse rate:   69 / minute BP sitting:   134 / 90  (left arm) Cuff size:   regular  Vitals Entered ByZella Ball Ewing (March 28, 2009 1:15 PM)  O2 Flow:  Room air  Preventive Care Screening  Colonoscopy:    Date:  01/13/2009    Next Due:  01/2014    Results:  abnormal   Last Flu Shot:    Date:  12/24/2008    Results:  given   CC: New Patient/RE   CC:  New Patient/RE.  History of Present Illness: here to establish;  no overt bleeding, last plt check at the cancer center last fall 2010 and does not intend at this time to return since we can check it here and cant affort the copay;  also c/o constiopation he thinks from percoet 5/325 which did help;  does not want further narcotic at this time if can help it but ibuprofen does not help as much;  no change in lower bakc pain  - has chronic pain but no other bowel or bladder changes, fever, wt loss, night sweats,  and no LE pain, weakness or numbness;   Pt denies CP, sob, doe, wheezing, orthopnea, pnd, worsening LE edema, palps, dizziness or syncope    Pt denies new neuro symptoms such as headache, facial or extremity weakness  also states he had PSA done 2010 in the fall most likely but he is not sure; does not want checked at this time;     Preventive Screening-Counseling & Management  Alcohol-Tobacco     Smoking Status: current  Problems Prior to Update: None  Medications Prior to Update: 1)  None  Current Medications (verified): 1)  Hydrochlorothiazide 25 Mg Tabs (Hydrochlorothiazide) .Marland Kitchen.. 1 By Mouth Once Daily 2)  Clorazepate Dipotassium 7.5 Mg Tabs (Clorazepate Dipotassium) .... Two To Three By Mouth As Needed For Pain 3)  Lisinopril 10 Mg Tabs (Lisinopril) .Marland Kitchen.. 1 By Mouth Once Daily 4)  Metoprolol  Succinate 100 Mg Xr24h-Tab (Metoprolol Succinate) .... 1/2 By Mouth Two Times A Day 5)  Ibuprofen 800 Mg Tabs (Ibuprofen) .... 2 By Mouth Daily As Needed 6)  Trazodone Hcl 50 Mg Tabs (Trazodone Hcl) .Marland Kitchen.. 1 By Mouth At Bedtime 7)  Tramadol Hcl 50 Mg Tabs (Tramadol Hcl) .Marland Kitchen.. 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 - 1 -2 cig's per day Alcohol use-no  Risk Factors: Smoking Status: current (03/28/2009)  Past Medical History: HEPATITIS C (ICD-070.51)  - saw [facility] but refused liver bx and declines to return DEPRESSION (ICD-311) DEGENERATIVE JOINT DISEASE (ICD-715.90) ALLERGIC RHINITIS (ICD-477.9) PERSONAL HISTORY OF ALCOHOLISM (ICD-V11.3) ANXIETY (ICD-300.00) LOW BACK PAIN, CHRONIC (ICD-724.2) DISC DISEASE, LUMBAR (ICD-722.52) Hyperlipidemia Hypertension gallstones Renal insufficiency - chronic Diverticulosis, colon Peripheral vascular disease  - Ca+2 vascular on CT abd 2005 thrombocytopenia - dr Clelia Croft but does not plan to return  soon  Past Surgical History: s/p right shoudler surgury 2003  Family History: Reviewed history and no changes required. father with lung cancer daughter with breast cancer brother with stroke  Social History: Reviewed history and no changes required. 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   Head:  normocephalic and atraumatic.   Eyes:  vision grossly intact, pupils equal, and pupils round.     Ears:  R ear normal and L ear normal.   Nose:  no external deformity and no nasal discharge.   Mouth:  no gingival abnormalities and pharynx pink and moist.   Neck:  supple and no masses.   Lungs:  normal respiratory effort and normal breath sounds.   Heart:  normal rate and regular rhythm.   Abdomen:  soft, non-tender, and normal bowel sounds.   Msk:  no joint tenderness and no joint swelling.   Extremities:  no edema, no erythema  Neurologic:  cranial nerves II-XII intact and strength normal in all extremities.     Impression & Recommendations:  Problem # 1:  HYPERTENSION (ICD-401.9)  His updated medication list for this problem includes:    Hydrochlorothiazide 25 Mg Tabs (Hydrochlorothiazide) .Marland Kitchen... 1 by mouth once daily    Lisinopril 10 Mg Tabs (Lisinopril) .Marland Kitchen... 1 by mouth once daily    Metoprolol Succinate 100 Mg Xr24h-tab (Metoprolol succinate) .Marland Kitchen... 1/2 by mouth two times a day  BP today: 134/90 stable overall by hx and exam, ok to continue meds/tx as is   Problem # 2:  THROMBOCYTOPENIA (ICD-287.5) to re-check , no overt bleeding  Problem # 3:  HYPERLIPIDEMIA (ICD-272.4)  to check lipids - goal ldl less than 70 but likely should avoid statins due to unknown liver status  Orders: TLB-Lipid Panel (80061-LIPID)  Problem # 4:  FATIGUE (ICD-780.79)  most likely multifactorial - exam benign, to check labs below; follow with expectant management   Orders: TLB-BMP (Basic Metabolic Panel-BMET) (80048-METABOL) TLB-CBC Platelet - w/Differential (85025-CBCD) TLB-Hepatic/Liver Function Pnl (80076-HEPATIC) TLB-TSH (Thyroid Stimulating Hormone) (84443-TSH) TLB-Sedimentation Rate (ESR) (85652-ESR) TLB-IBC Pnl (Iron/FE;Transferrin) (83550-IBC) TLB-B12 + Folate Pnl (82746_82607-B12/FOL) T-Vitamin D (25-Hydroxy) ([address])  Complete Medication List: 1)  Hydrochlorothiazide 25 Mg Tabs (Hydrochlorothiazide) .Marland Kitchen.. 1 by mouth once daily 2)  Clorazepate Dipotassium 7.5 Mg Tabs  (Clorazepate dipotassium) .... Two to three by mouth as needed for pain 3)  Lisinopril 10 Mg Tabs (Lisinopril) .Marland Kitchen.. 1 by mouth once daily 4)  Metoprolol Succinate 100 Mg Xr24h-tab (Metoprolol succinate) .... 1/2 by mouth two times a day 5)  Ibuprofen 800 Mg Tabs (Ibuprofen) .... 2 by mouth daily as needed 6)  Trazodone Hcl 50 Mg Tabs (Trazodone hcl) .Marland Kitchen.. 1 by mouth at bedtime 7)  Tramadol Hcl 50 Mg Tabs (Tramadol hcl) .Marland Kitchen.. 1 by mouth q 6 hrs as needed  Other Orders: TLB-Udip ONLY (81003-UDIP)  Patient Instructions: 1)  you had the tetanus and pneumonia shots today 2)  Please go to the Lab in the basement for your blood and/or urine tests today 3)  Continue all previous medications as before this visit  4)  Please schedule a follow-up appointment in 6 months or sooner if needed Prescriptions: IBUPROFEN 800 MG TABS (IBUPROFEN) 2 by mouth daily as  needed  #180 x 3   Entered and Authorized by:   Corwin Levins MD   Signed by:   Corwin Levins MD on 03/28/2009   Method used:   Print then Give to Patient   RxID:   [id number]   Appended Document: Immunization Entry     Immunization History:  Tetanus/Td Immunization History:    Tetanus/Td:  td (03/28/2009)  Pneumovax Immunization History:    Pneumovax:  pneumovax (03/28/2009)

## 2010-04-25 NOTE — Assessment & Plan Note (Signed)
Summary: 6 mo rov /nws #   Vital Signs:  Patient profile:   70 year old male Height:      72 inches Weight:      195.50 pounds BMI:     26.61 O2 Sat:      97 % on Room air Temp:     98.1 degrees F oral Pulse rate:   69 / minute BP sitting:   122 / 80  (left arm) Cuff size:   regular  Vitals Entered By: Zella Ball Ewing CMA Duncan Dull) (September 28, 2009 1:37 PM)  O2 Flow:  Room air CC: 6 month ROV/RE   CC:  6 month ROV/RE.  History of Present Illness: here to f/u;  has "steady running out" of the nose mostly clearish fluid, worse outside and occasinal "dust in the eyes"  ;  has not tried OTC meds;  due soon for right cataract surg but not scheduled;  left getting close per pt;  Pt denies CP, sob, doe, wheezing, orthopnea, pnd, worsening LE edema, palps, dizziness or syncope  Pt denies new neuro symptoms such as headache, facial or extremity weakness  No overt bleeding or bruising. Overall goo dcomplaicne with meds, good tolerance.  c/o pain persistent to known left hip DJD and ankle DJD - still has 30 percocet left and tries to minimize taking;  does not want to see ortho at this time as he needs his cataract first, then some oral surgury with out of pocket cost, then would consider the ortho eval for left hip "bone on bone" per pt;  Also has right shoulder pain worse with cold weather and lower back and left ankle.    Problems Prior to Update: 1)  Fatigue  (ICD-780.79) 2)  Thrombocytopenia  (ICD-287.5) 3)  Peripheral Vascular Disease  (ICD-443.9) 4)  Diverticulosis, Colon  (ICD-562.10) 5)  Renal Insufficiency  (ICD-588.9) 6)  Cholelithiasis  (ICD-574.20) 7)  Hypertension  (ICD-401.9) 8)  Hyperlipidemia  (ICD-272.4) 9)  Hepatitis C  (ICD-070.51) 10)  Depression  (ICD-311) 11)  Degenerative Joint Disease  (ICD-715.90) 12)  Allergic Rhinitis  (ICD-477.9) 13)  Personal History of Alcoholism  (ICD-V11.3) 14)  Anxiety  (ICD-300.00) 15)  Low Back Pain, Chronic  (ICD-724.2) 16)  Disc Disease,  Lumbar  (ICD-722.52)  Medications Prior to Update: 1)  Hydrochlorothiazide 25 Mg Tabs (Hydrochlorothiazide) .Marland Kitchen.. 1 By Mouth Once Daily 2)  Clorazepate Dipotassium 7.5 Mg Tabs (Clorazepate Dipotassium) .Marland Kitchen.. 1 By Mouth Three Times A Day As Needed 3)  Lisinopril 10 Mg Tabs (Lisinopril) .Marland Kitchen.. 1 By Mouth Once Daily 4)  Metoprolol Tartrate 100 Mg Tabs (Metoprolol Tartrate) .... 1/2 By Mouth Two Times A Day 5)  Ibuprofen 800 Mg Tabs (Ibuprofen) .... 2 By Mouth Daily As Needed 6)  Trazodone Hcl 50 Mg Tabs (Trazodone Hcl) .Marland Kitchen.. 1 By Mouth At Bedtime 7)  Tramadol Hcl 50 Mg Tabs (Tramadol Hcl) .Marland Kitchen.. 1 By Mouth Q 6 Hrs As Needed  Current Medications (verified): 1)  Hydrochlorothiazide 25 Mg Tabs (Hydrochlorothiazide) .Marland Kitchen.. 1 By Mouth Once Daily 2)  Clorazepate Dipotassium 7.5 Mg Tabs (Clorazepate Dipotassium) .Marland Kitchen.. 1 By Mouth Three Times A Day As Needed 3)  Lisinopril 10 Mg Tabs (Lisinopril) .Marland Kitchen.. 1 By Mouth Once Daily 4)  Metoprolol Tartrate 100 Mg Tabs (Metoprolol Tartrate) .... 1/2 By Mouth Two Times A Day 5)  Ibuprofen 800 Mg Tabs (Ibuprofen) .... 2 By Mouth Daily As Needed 6)  Trazodone Hcl 50 Mg Tabs (Trazodone Hcl) .Marland Kitchen.. 1 By Mouth At Bedtime 7)  Tramadol Hcl 50 Mg Tabs (Tramadol Hcl) .Marland Kitchen.. 1 By Mouth Q 6 Hrs As Needed 8)  Cialis 20 Mg Tabs (Tadalafil) .Marland Kitchen.. 1 By Mouth Every Other Day As Needed 9)  Fexofenadine Hcl 180 Mg Tabs (Fexofenadine Hcl) .Marland Kitchen.. 1 By Mouth Once Daily As Needed 10)  Oxycodone-Acetaminophen 5-325 Mg Tabs (Oxycodone-Acetaminophen) .Marland Kitchen.. 1 By Mouth Once Daily As Needed Breakthrough Pain  Allergies (verified): No Known Drug Allergies  Past History:  Social History: Last updated: 03/28/2009 Married 2 children retired - Furniture conservator/restorer - truck Hospital doctor Current Smoker - 1 -2 cig's per day Alcohol use-no  Risk Factors: Smoking Status: current (03/28/2009)  Past Medical History: HEPATITIS C (ICD-070.51)  - saw [facility] but refused liver bx and declines to return DEPRESSION  (ICD-311) DEGENERATIVE JOINT DISEASE (ICD-715.90) - left hip, bilat ankles, right shoudler ALLERGIC RHINITIS (ICD-477.9) PERSONAL HISTORY OF ALCOHOLISM (ICD-V11.3) ANXIETY (ICD-300.00) LOW BACK PAIN, CHRONIC (ICD-724.2) DISC DISEASE, LUMBAR (ICD-722.52)  - DDD Hyperlipidemia Hypertension gallstones Renal insufficiency - chronic Diverticulosis, colon Peripheral vascular disease  - Ca+2 vascular on CT abd 2005 thrombocytopenia - dr Clelia Croft but does not plan to return soon erectile dysfunction  Past Surgical History: s/p right [facility] surgury 2003 [facility]  Review of Systems       all otherwise negative per pt -    Physical Exam  General:  alert and well-developed.   Head:  normocephalic and atraumatic.   Eyes:  vision grossly intact, pupils equal, and pupils round.   Ears:  R ear normal and L ear normal.   Nose:  no external deformity and no nasal discharge.   Mouth:  no gingival abnormalities and pharynx pink and moist.   Neck:  supple and no masses.   Lungs:  normal respiratory effort and normal breath sounds.   Heart:  normal rate and regular rhythm.   Msk:  no joint tenderness and no joint swelling.   Extremities:  no edema, no erythema  Skin:  no bruising Psych:  not depressed appearing and slightly anxious.     Impression & Recommendations:  Problem # 1:  ALLERGIC RHINITIS (ICD-477.9)  mild, for allegra as needed   His updated medication list for this problem includes:    Fexofenadine Hcl 180 Mg Tabs (Fexofenadine hcl) .Marland Kitchen... 1 by mouth once daily as needed  Problem # 2:  ERECTILE DYSFUNCTION, ORGANIC (ICD-607.84)  His updated medication list for this problem includes:    Cialis 20 Mg Tabs (Tadalafil) .Marland Kitchen... 1 by mouth every other day as needed treat as above, f/u any worsening signs or symptoms   Problem # 3:  DEGENERATIVE JOINT DISEASE (ICD-715.90)  His updated medication list for this problem includes:    Ibuprofen 800 Mg Tabs  (Ibuprofen) .Marland Kitchen... 2 by mouth daily as needed    Tramadol Hcl 50 Mg Tabs (Tramadol hcl) .Marland Kitchen... 1 by mouth q 6 hrs as needed    Oxycodone-acetaminophen 5-325 Mg Tabs (Oxycodone-acetaminophen) .Marland Kitchen... 1 by mouth once daily as needed breakthrough pain stable overall by hx and exam, ok to continue meds/tx as is , declines ortho referral at this time  Problem # 4:  RENAL INSUFFICIENCY (ICD-588.9)  for f/u labs today  Orders: TLB-BMP (Basic Metabolic Panel-BMET) (80048-METABOL)  Complete Medication List: 1)  Hydrochlorothiazide 25 Mg Tabs (Hydrochlorothiazide) .Marland Kitchen.. 1 by mouth once daily 2)  Clorazepate Dipotassium 7.5 Mg Tabs (Clorazepate dipotassium) .Marland Kitchen.. 1 by mouth three times a day as needed 3)  Lisinopril 10 Mg  Tabs (Lisinopril) .Marland Kitchen.. 1 by mouth once daily 4)  Metoprolol Tartrate 100 Mg Tabs (Metoprolol tartrate) .... 1/2 by mouth two times a day 5)  Ibuprofen 800 Mg Tabs (Ibuprofen) .... 2 by mouth daily as needed 6)  Trazodone Hcl 50 Mg Tabs (Trazodone hcl) .Marland Kitchen.. 1 by mouth at bedtime 7)  Tramadol Hcl 50 Mg Tabs (Tramadol hcl) .Marland Kitchen.. 1 by mouth q 6 hrs as needed 8)  Cialis 20 Mg Tabs (Tadalafil) .Marland Kitchen.. 1 by mouth every other day as needed 9)  Fexofenadine Hcl 180 Mg Tabs (Fexofenadine hcl) .Marland Kitchen.. 1 by mouth once daily as needed 10)  Oxycodone-acetaminophen 5-325 Mg Tabs (Oxycodone-acetaminophen) .Marland Kitchen.. 1 by mouth once daily as needed breakthrough pain  Other Orders: TLB-CBC Platelet - w/Differential (85025-CBCD)  Patient Instructions: 1)  Please take all new medications as prescribed 2)  Continue all previous medications as before this visit  3)  Please go to the Lab in the basement for your blood and/or urine tests today 4)  Please schedule a follow-up appointment in 6 months in Jan 2012 with for "yearly medicare exam" Prescriptions: FEXOFENADINE HCL 180 MG TABS (FEXOFENADINE HCL) 1 by mouth once daily as needed  #30 x 11   Entered and Authorized by:   Corwin Levins MD   Signed by:   Corwin Levins MD on 09/28/2009   Method used:   Print then Give to Patient   RxID:   [id number] CIALIS 20 MG TABS (TADALAFIL) 1 by mouth every other day as needed  #10 x 11   Entered and Authorized by:   Corwin Levins MD   Signed by:   Corwin Levins MD on 09/28/2009   Method used:   Print then Give to Patient   RxID:   [id number] TRAZODONE HCL 50 MG TABS (TRAZODONE HCL) 1 by mouth at bedtime  #100 x 3   Entered and Authorized by:   Corwin Levins MD   Signed by:   Corwin Levins MD on 09/28/2009   Method used:   Electronically to        [facility] (retail)       [address]       [place], [place]  [id number]       Ph: [phone]       Fax: [phone]   RxID:   [id number]

## 2010-04-25 NOTE — Progress Notes (Signed)
Summary: medication refill  Phone Note Refill Request Message from:  Fax from Pharmacy on January 16, 2010 8:46 AM  Refills Requested: Medication #1:  CLORAZEPATE DIPOTASSIUM 7.5 MG TABS 1 by mouth three times a day as needed   Dosage confirmed as above?Dosage Confirmed   Last Refilled: 04/25/2009   Notes: [facility], [phone] Initial call taken by: Zella Ball Ewing CMA Duncan Dull),  January 16, 2010 8:47 AM  Follow-up for Phone Call        faxed hardcopy to pharmacy Follow-up by: [name] Ewing CMA Duncan Dull),  January 16, 2010 9:34 AM    New/Updated Medications: CLORAZEPATE DIPOTASSIUM 7.5 MG TABS (CLORAZEPATE DIPOTASSIUM) 1 by mouth three times a day as needed Prescriptions: CLORAZEPATE DIPOTASSIUM 7.5 MG TABS (CLORAZEPATE DIPOTASSIUM) 1 by mouth three times a day as needed  #90 x 2   Entered and Authorized by:   Corwin Levins MD   Signed by:   Corwin Levins MD on 01/16/2010   Method used:   Print then Give to Patient   RxID:   [id number]  done hardcopy to [facility] side B - dahlia Corwin Levins MD  January 16, 2010 9:29 AM

## 2010-04-25 NOTE — Progress Notes (Signed)
Summary: med refill  Phone Note Refill Request  on April 25, 2009 11:47 AM  Refills Requested: Medication #1:  CLORAZEPATE DIPOTASSIUM 7.5 MG TABS two to three by mouth as needed for pain   Dosage confirmed as above?Dosage Confirmed   Notes: [facility] [phone] Initial call taken by: Scharlene Gloss,  April 25, 2009 11:47 AM  Follow-up for Phone Call        rx faxed to pharmacy Follow-up by: Margaret Pyle, CMA,  April 25, 2009 1:02 PM    New/Updated Medications: CLORAZEPATE DIPOTASSIUM 7.5 MG TABS (CLORAZEPATE DIPOTASSIUM) 1 by mouth three times a day as needed Prescriptions: CLORAZEPATE DIPOTASSIUM 7.5 MG TABS (CLORAZEPATE DIPOTASSIUM) 1 by mouth three times a day as needed  #90 x 2   Entered and Authorized by:   Corwin Levins MD   Signed by:   Corwin Levins MD on 04/25/2009   Method used:   Print then Give to Patient   RxID:   [id number]  done hardcopy to [facility] side B - dahlia  Corwin Levins MD  April 25, 2009 12:57 PM

## 2010-04-25 NOTE — Progress Notes (Signed)
  Phone Note Refill Request  on July 18, 2009 1:06 PM  Refills Requested: Medication #1:  LISINOPRIL 10 MG TABS 1 by mouth once daily   Dosage confirmed as above?Dosage Confirmed   Notes: [facility] Initial call taken by: Scharlene Gloss,  July 18, 2009 1:06 PM    Prescriptions: LISINOPRIL 10 MG TABS (LISINOPRIL) 1 by mouth once daily  #30 x 10   Entered by:   Scharlene Gloss   Authorized by:   Corwin Levins MD   Signed by:   Scharlene Gloss on 07/18/2009   Method used:   Faxed to ...       [facility] (retail)       [address]       [place], [place]  [id number]       Ph: [phone]       Fax: [phone]   RxID:   [id number]

## 2010-04-25 NOTE — Progress Notes (Signed)
Summary: lab results  Phone Note Call from Patient Call back at Home Phone [phone]   Caller: Patient Call For: Corwin Levins MD Summary of Call: Pt would like a call with lab results, she states results are not on phone tree. Initial call taken by: Verdell Face,  April 04, 2009 9:29 AM  Follow-up for Phone Call        labs normal, pt informed Follow-up by: Margaret Pyle, CMA,  April 04, 2009 9:37 AM

## 2010-04-27 NOTE — Progress Notes (Signed)
  Phone Note Refill Request Message from:  Fax from Pharmacy on March 23, 2010 8:16 AM  Refills Requested: Medication #1:  IBUPROFEN 800 MG TABS 2 by mouth daily as needed   Dosage confirmed as above?Dosage Confirmed   Notes: [facility] Initial call taken by: [name] Ewing CMA (AAMA),  March 23, 2010 8:16 AM    Prescriptions: IBUPROFEN 800 MG TABS (IBUPROFEN) 2 by mouth daily as needed  #180 x 3   Entered by:   Scharlene Gloss CMA (AAMA)   Authorized by:   Corwin Levins MD   Signed by:   Scharlene Gloss CMA (AAMA) on 03/23/2010   Method used:   Faxed to ...       [facility] (retail)       [address]       [place], [place]  [id number]       Ph: [phone]       Fax: [phone]   RxID:   [id number]

## 2010-04-27 NOTE — Assessment & Plan Note (Signed)
Summary: YEARLY MEDICARE FU/ LABS AFTER/NWS  #   Vital Signs:  Patient profile:   70 year old male Height:      72 inches Weight:      198.50 pounds BMI:     27.02 O2 Sat:      97 % on Room air Temp:     99.1 degrees F oral Pulse rate:   73 / minute BP sitting:   150 / 82  (left arm) Cuff size:   regular  Vitals Entered By: Zella Ball Ewing CMA (AAMA) (April 04, 2010 8:40 AM)  O2 Flow:  Room air  CC: yearly/RE   CC:  yearly/RE.  History of Present Illness: here for f/u; overall doing ok, but has ongoing fatigue without OSA symtpoms but has increased depressive symtpoms with fatigue, low mood , anhedonia, but no suicidal ideation, has mult social stressors mostly involving his daughter who takes crack adn leaves him with her 4 children;  Pt denies CP, worsening sob, doe, wheezing, orthopnea, pnd, worsening LE edema, palps, dizziness or syncope  Pt denies new neuro symptoms such as headache, facial or extremity weakness  Pt denies polydipsia, polyuria    Overall good compliance with meds, sometimes  trying to follow low chol diet, wt stable, little excercise however .   Has ongoing left sicatica symtpoms - needs pain med refills.  No worsening bowel or bladder changes, gait change or fall.    Problems Prior to Update: 1)  Special Screening Malig Neoplasms Other Sites  (ICD-V76.49) 2)  Fatigue  (ICD-780.79) 3)  Preventive Health Care  (ICD-V70.0) 4)  Wheezing  (ICD-786.07) 5)  Erectile Dysfunction, Organic  (ICD-607.84) 6)  Fatigue  (ICD-780.79) 7)  Thrombocytopenia  (ICD-287.5) 8)  Peripheral Vascular Disease  (ICD-443.9) 9)  Diverticulosis, Colon  (ICD-562.10) 10)  Renal Insufficiency  (ICD-588.9) 11)  Cholelithiasis  (ICD-574.20) 12)  Hypertension  (ICD-401.9) 13)  Hyperlipidemia  (ICD-272.4) 14)  Hepatitis C  (ICD-070.51) 15)  Depression  (ICD-311) 16)  Degenerative Joint Disease  (ICD-715.90) 17)  Allergic Rhinitis  (ICD-477.9) 18)  Personal History of Alcoholism   (ICD-V11.3) 19)  Anxiety  (ICD-300.00) 20)  Low Back Pain, Chronic  (ICD-724.2) 21)  Disc Disease, Lumbar  (ICD-722.52)  Medications Prior to Update: 1)  Hydrochlorothiazide 25 Mg Tabs (Hydrochlorothiazide) .Marland Kitchen.. 1 By Mouth Once Daily 2)  Clorazepate Dipotassium 7.5 Mg Tabs (Clorazepate Dipotassium) .Marland Kitchen.. 1 By Mouth Three Times A Day As Needed 3)  Lisinopril 10 Mg Tabs (Lisinopril) .Marland Kitchen.. 1 By Mouth Once Daily 4)  Metoprolol Tartrate 100 Mg Tabs (Metoprolol Tartrate) .... 1/2 By Mouth Two Times A Day 5)  Ibuprofen 800 Mg Tabs (Ibuprofen) .... 2 By Mouth Daily As Needed 6)  Trazodone Hcl 50 Mg Tabs (Trazodone Hcl) .Marland Kitchen.. 1 By Mouth At Bedtime 7)  Tramadol Hcl 50 Mg Tabs (Tramadol Hcl) .Marland Kitchen.. 1 By Mouth Q 6 Hrs As Needed 8)  Cialis 20 Mg Tabs (Tadalafil) .Marland Kitchen.. 1 By Mouth Every Other Day As Needed 9)  Fexofenadine Hcl 180 Mg Tabs (Fexofenadine Hcl) .Marland Kitchen.. 1 By Mouth Once Daily As Needed 10)  Oxycodone-Acetaminophen 5-325 Mg Tabs (Oxycodone-Acetaminophen) .Marland Kitchen.. 1 By Mouth Once Daily As Needed Breakthrough Pain 11)  Azithromycin 250 Mg Tabs (Azithromycin) .... 2po Qd For 1 Day, Then 1po Qd For 4days, Then Stop 12)  Tessalon Perles 100 Mg Caps (Benzonatate) .Marland Kitchen.. 1-2 Tabs By Mouth Three Times A Day As Needed Cough 13)  Prednisone 10 Mg Tabs (Prednisone) .... 3po Qd For 3days, Then 2po  Qd For 3days, Then 1po Qd For 3days, Then Stop  Current Medications (verified): 1)  Clorazepate Dipotassium 7.5 Mg Tabs (Clorazepate Dipotassium) .Marland Kitchen.. 1 By Mouth Three Times A Day As Needed 2)  Lisinopril-Hydrochlorothiazide 20-12.5 Mg Tabs (Lisinopril-Hydrochlorothiazide) .Marland Kitchen.. 1po Once Daily 3)  Metoprolol Tartrate 100 Mg Tabs (Metoprolol Tartrate) .... 1/2 By Mouth Two Times A Day 4)  Ibuprofen 800 Mg Tabs (Ibuprofen) .... 2 By Mouth Daily As Needed 5)  Trazodone Hcl 50 Mg Tabs (Trazodone Hcl) .Marland Kitchen.. 1 By Mouth At Bedtime 6)  Tramadol Hcl 50 Mg Tabs (Tramadol Hcl) .Marland Kitchen.. 1 By Mouth Q 6 Hrs As Needed 7)  Fexofenadine Hcl 180 Mg  Tabs (Fexofenadine Hcl) .Marland Kitchen.. 1 By Mouth Once Daily As Needed  Allergies (verified): No Known Drug Allergies  Past History:  Past Medical History: Last updated: 09/28/2009 HEPATITIS C (ICD-070.51)  - saw [facility] but refused liver bx and declines to return DEPRESSION (ICD-311) DEGENERATIVE JOINT DISEASE (ICD-715.90) - left hip, bilat ankles, right shoudler ALLERGIC RHINITIS (ICD-477.9) PERSONAL HISTORY OF ALCOHOLISM (ICD-V11.3) ANXIETY (ICD-300.00) LOW BACK PAIN, CHRONIC (ICD-724.2) DISC DISEASE, LUMBAR (ICD-722.52)  - DDD Hyperlipidemia Hypertension gallstones Renal insufficiency - chronic Diverticulosis, colon Peripheral vascular disease  - Ca+2 vascular on CT abd 2005 thrombocytopenia - dr Clelia Croft but does not plan to return soon erectile dysfunction  Past Surgical History: Last updated: 09/28/2009 s/p right shoulder surgury 2003 [facility]  Family History: Last updated: 03/28/2009 father with lung cancer daughter with breast cancer brother with stroke  Social History: Last updated: 02/07/2010 Married 2 children retired - Furniture conservator/restorer - truck Hospital doctor Current Smoker - 1 -2 cig's per day Alcohol use-no Drug use-no  Risk Factors: Smoking Status: current (03/28/2009)  Review of Systems       all otherwise negative per pt -    Physical Exam  General:  alert and overweight-appearing.   Head:  normocephalic and atraumatic.   Eyes:  vision grossly intact, pupils equal, and pupils round.   Ears:  R ear normal and L ear normal.   Nose:  no external deformity and no nasal discharge.   Mouth:  no gingival abnormalities and pharynx pink and moist.   Neck:  supple and no masses.   Lungs:  normal respiratory effort and normal breath sounds.   Heart:  normal rate and regular rhythm.   Abdomen:  soft, non-tender, and normal bowel sounds.   Msk:  no joint tenderness and no joint swelling.   Extremities:  no edema, no erythema    Neurologic:  walks with cane, strength normal in all extremities and gait normal.   Skin:  no rashes.   Psych:  depressed affect and slightly anxious.     Impression & Recommendations:  Problem # 1:  FATIGUE (ICD-780.79) exam benign, to check labs below; follow with expectant management  Orders: TLB-BMP (Basic Metabolic Panel-BMET) (80048-METABOL) TLB-CBC Platelet - w/Differential (85025-CBCD) TLB-Hepatic/Liver Function Pnl (80076-HEPATIC) TLB-TSH (Thyroid Stimulating Hormone) (84443-TSH)  Problem # 2:  HYPERTENSION (ICD-401.9)  The following medications were removed from the medication list:    Hydrochlorothiazide 25 Mg Tabs (Hydrochlorothiazide) .Marland Kitchen... 1 by mouth once daily His updated medication list for this problem includes:    Lisinopril-hydrochlorothiazide 20-12.5 Mg Tabs (Lisinopril-hydrochlorothiazide) .Marland Kitchen... 1po once daily    Metoprolol Tartrate 100 Mg Tabs (Metoprolol tartrate) .Marland Kitchen... 1/2 by mouth two times a day ok to change to lis/HCT with incr of the ACE today for uncontrolled BP  Orders: TLB-Udip ONLY (81003-UDIP)  BP today: 150/82 Prior BP: 144/82 (02/07/2010)  Labs Reviewed: K+: 4.3 (09/28/2009) Creat: : 1.9 (09/28/2009)   Chol: 124 (03/28/2009)   HDL: 22.90 (03/28/2009)   TG: 217.0 (03/28/2009)  Problem # 3:  HYPERLIPIDEMIA (ICD-272.4)  Orders: TLB-Lipid Panel (80061-LIPID)  Labs Reviewed: SGOT: 68 (03/28/2009)   SGPT: 66 (03/28/2009)   HDL:22.90 (03/28/2009)  Chol:124 (03/28/2009)  Trig:217.0 (03/28/2009) stable overall by hx and exam, ok to continue meds/tx as is  - consider statin though he has limited funds and is not inclined at this time  Problem # 4:  DEPRESSION (ICD-311)  His updated medication list for this problem includes:    Clorazepate Dipotassium 7.5 Mg Tabs (Clorazepate dipotassium) .Marland Kitchen... 1 by mouth three times a day as needed    Trazodone Hcl 50 Mg Tabs (Trazodone hcl) .Marland Kitchen... 1 by mouth at bedtime  - I suggested to pt to add citalopram  10 mg, but he defers due to $10/mo cost and had HA with "one of those pills" in the past  Discussed treatment options, including trial of antidpressant medication.Patient agrees to call if any worsening of symptoms or thoughts of doing harm arise. Verified that the patient has no suicidal ideation at this time.   Problem # 5:  DISC DISEASE, LUMBAR (ICD-722.52) with left sciatica symptoms, chronic recurrent  - for refill tramadol today. can also consider tylenol arthritis three times a day as needed   Complete Medication List: 1)  Clorazepate Dipotassium 7.5 Mg Tabs (Clorazepate dipotassium) .Marland Kitchen.. 1 by mouth three times a day as needed 2)  Lisinopril-hydrochlorothiazide 20-12.5 Mg Tabs (Lisinopril-hydrochlorothiazide) .Marland Kitchen.. 1po once daily 3)  Metoprolol Tartrate 100 Mg Tabs (Metoprolol tartrate) .... 1/2 by mouth two times a day 4)  Ibuprofen 800 Mg Tabs (Ibuprofen) .... 2 by mouth daily as needed 5)  Trazodone Hcl 50 Mg Tabs (Trazodone hcl) .Marland Kitchen.. 1 by mouth at bedtime 6)  Tramadol Hcl 50 Mg Tabs (Tramadol hcl) .Marland Kitchen.. 1 by mouth q 6 hrs as needed 7)  Fexofenadine Hcl 180 Mg Tabs (Fexofenadine hcl) .Marland Kitchen.. 1 by mouth once daily as needed  Other Orders: EKG w/ Interpretation (93000) TLB-PSA (Prostate Specific Antigen) (84153-PSA)  Patient Instructions: 1)  Please go to the Lab in the basement for your blood and/or urine tests today  2)  Please call the number on the [facility] Card for results of your testing 3)  stop the lisinopril 10 mg and the Hydrochlorothiazide (fluid pill) for blood pressure when the current bottles are done 4)  after that, start the combination pill called Lisinopril HCT as prescribed 5)  Check your Blood Pressure regularly. If it is above 140/90: you should make an appointment .  6)  Continue all previous medications as before this visit 7)  Please call if you would like to try Citalopram 10 mg per day , for irrtability, nerves and low mood 8)  Please schedule a follow-up  appointment in 6 months, or sooner if needed Prescriptions: TRAMADOL HCL 50 MG TABS (TRAMADOL HCL) 1 by mouth q 6 hrs as needed  #120 x 2   Entered and Authorized by:   Corwin Levins MD   Signed by:   Corwin Levins MD on 04/04/2010   Method used:   Print then Give to Patient   RxID:   [id number] TRAZODONE HCL 50 MG TABS (TRAZODONE HCL) 1 by mouth at bedtime  #100 x 1   Entered and Authorized by:   Corwin Levins MD   Signed by:  Corwin Levins MD on 04/04/2010   Method used:   Print then Give to Patient   RxID:   [id number] METOPROLOL TARTRATE 100 MG TABS (METOPROLOL TARTRATE) 1/2 by mouth two times a day  #100 x 3   Entered and Authorized by:   Corwin Levins MD   Signed by:   Corwin Levins MD on 04/04/2010   Method used:   Print then Give to Patient   RxID:   [id number] CLORAZEPATE DIPOTASSIUM 7.5 MG TABS (CLORAZEPATE DIPOTASSIUM) 1 by mouth three times a day as needed  #90 x 2   Entered and Authorized by:   Corwin Levins MD   Signed by:   Corwin Levins MD on 04/04/2010   Method used:   Print then Give to Patient   RxID:   [id number] LISINOPRIL-HYDROCHLOROTHIAZIDE 20-12.5 MG TABS (LISINOPRIL-HYDROCHLOROTHIAZIDE) 1po once daily  #90 x 3   Entered and Authorized by:   Corwin Levins MD   Signed by:   Corwin Levins MD on 04/04/2010   Method used:   Print then Give to Patient   RxID:   [id number]    Orders Added: 1)  EKG w/ Interpretation [93000] 2)  TLB-BMP (Basic Metabolic Panel-BMET) [80048-METABOL] 3)  TLB-CBC Platelet - w/Differential [85025-CBCD] 4)  TLB-Hepatic/Liver Function Pnl [80076-HEPATIC] 5)  TLB-TSH (Thyroid Stimulating Hormone) [84443-TSH] 6)  TLB-PSA (Prostate Specific Antigen) [84153-PSA] 7)  TLB-Lipid Panel [80061-LIPID] 8)  TLB-Udip ONLY [81003-UDIP] 9)  Est. Patient Level IV [33295]

## 2010-08-11 NOTE — Discharge Summary (Signed)
NAME:  Gerald, Gomez                          ACCOUNT NO.:  [id number]   MEDICAL RECORD NO.:  [id number]                   PATIENT TYPE:  INP   LOCATION:  3736                                 FACILITY:  [facility]   PHYSICIAN:  Casimiro Needle B. Sherene Sires, M.D. [facility]           DATE OF BIRTH:  11/26/1940   DATE OF ADMISSION:  04/29/2002  DATE OF DISCHARGE:                                 DISCHARGE SUMMARY   FINAL DIAGNOSES:  1. Syncope, likely adverse drug effect.  2. Chronic low back pain.  3. History of alcoholism and chronic anxiety.   HISTORY OF PRESENT ILLNESS:  Please see history and physical. This is a 70-  year-old black male who presented to my  office yesterday to establish for  primary care purposes because his wife is a longstanding patient of mine  with severe asthma. We had just begun the interview while he was  in a  sitting position and he appeared to have a syncopal spell. Initially it was  not immediately clear what was happening, but he became unresponsive and his  eyes rolled up and he began moving his mouth in a chewing maneuver and this  all resolved when he laid flat. When I asked him what happened he said  nothing happened. I knew what was happening the entire time. I just did not  feel like responding to you.   Thinking that he might have some kind of a psychiatric  issue, I sat him  back up and reproduced the exact same symptoms sitting. However, sitting I  could not obtain a blood pressure and put him back in the supine position.  Even in the supine position his blood pressure remained in the low 80s and I  had the paramedics therefore transport him to [facility] where he  remained relatively hypotensive and bradycardic until he was given over 1  liter of fluid.   His workup has included an acute EKG at the time of his episode which showed  nonspecific changes, an echocardiogram showing normal left ventricular  function and a normal laboratory profile except a  BUN of 25 and a creatinine  of 1.5, ALT 97, AST of 81 and an albumin of 3.2. His platelet count was  61,000 on admission and repeated was 54,000 today with a normal differential  and normal white blood cells and hematocrit. I had ordered a liver spleen  scan to work up the low platelets and inadvertently a CT scan of the abdomen  and pelvis was done instead and indicated normal liver function as reviewed  with Dr. Gerrianne Scale, so the liver spleen scan was cancelled.   After a liter of IV fluid, he had no more episodes of unusual mental status,  either sitting, standing or walking  with adequate orthostatic blood  pressures documented today. At no time did he have any chest pain, nausea or  dyspnea, and  therefore I suspect the problem was simply that he came to the  office having taken his medications(beta blockers) without eating anything  that morning.   What I recommended for now was for him to stop the beta blockers all  together and to return to the office in a week for a repeat CBC and BMET to  follow up the slight elevation of creatinine and BUN as well as the platelet  problem. I have asked him to avoid all nonsteroidals and aspirin, except for  Vioxx which he can take 25 mg 1 to 2 q.d. p.r.n. and Tranxene 1/2 to 1 q.8h.  p.r.n.   His condition at the time of discharge therefore is completely improved with  no further syncopal episodes or arrhythmias documented by EKG. He will be  seen in the office for a followup in one week.                                                 Gerald Gomez. Sherene Sires, M.D. [facility]    MBW/MEDQ  D:  04/30/2002  T:  05/01/2002  Job:  [id number]

## 2010-08-11 NOTE — Op Note (Signed)
[place]. [facility]  Patient:    Gerald Gomez, Gerald Gomez Visit Number: [id number] MRN: [id number]          Service Type: DSU Location: [facility] Attending Physician:  Milly Jakob Dictated by:   Harvie Junior, M.D. Proc. Date: 05/19/01 Admit Date:  05/19/2001                             Operative Report  PREOPERATIVE DIAGNOSIS:  Impingement with acromioclavicular joint arthritis and questionable rotator cuff tear.  POSTOPERATIVE DIAGNOSES: 1. Anterolateral impingement. 2. Acromioclavicular joint arthritis. 3. Significant labral and biceps tendon tear and partial thickness rotator    cuff tear.  PRINCIPAL PROCEDURE: 1. Shoulder arthroscopy with acromioplasty anterolateral. 2. Arthroscopic distal clavicle excision. 3. Significant debridement of the biceps tendon, anterior and    posterior labrum and partial thickness rotator cuff tear from within the    glenohumeral joint.  SURGEON:  Harvie Junior, M.D.  ASSISTANT:  Currie Paris. Thedore Mins.  ANESTHESIA:  General.  BRIEF HISTORY:  Mr. Hyneman is a 70 year old male with a long history of having significant rotator cuff and shoulder pain.  The patient ultimately had been followed long term and noted to have some cystic changes in the humeral head. He ultimately had an MRI obtained which showed no frank rotator cuff tearing but did show some rotator cuff thickening and the was some question about biceps tendon abnormality.  It was my feeling that the patient probably did have a rotator cuff and with a significant impingement and AC joint arthritis, it was felt that he needed surgery.  At any rate, I felt that we could examine these things at the time of his surgery.  He was brought to the operating room for this procedure.  DESCRIPTION OF PROCEDURE:  The patient was brought to the operating room and after adequate general endotracheal anesthesia was attained, the patient was placed supine on the operating  table.  He was then moved into the beach chair position.  All bony prominences were well padded and the right shoulder was then prepped and draped in the usual sterile fashion.  Following this, the right shoulder was addressed in the normal arthroscopic situation.  The glenohumeral joint was addressed.  There was noted to be quite significant fray and high-grade tearing of the biceps tendon.  In cleaning this up, it was clear that the biceps tendon had been completely torn and this was just a remaining stump.  The biceps tendon was then debrided from within the glenohumeral joint.  The anterior and posterior labrums were also torn and these were debrided from within the glenohumeral joint.  Attention was then turned up to the rotator cuff where some debridement was undertaken of the musculotendinous junction of the cuff and then following the cuff out to its insertion.  It was clear that there was an opening where the biceps tendon at one point existed and now more clearly, the supraspinatus had a high-grade partial thickness tearing at this insertion.  This was debrided from within the glenohumeral joint back to a smooth rim and then certainly it was my feeling that it was in fact very close to a full-thickness tear.  At this time attention was turned up into the subacromial space.  In the subacromial space, there was noted to be dramatic spur formation and impingement of the rotator cuff from both the distal clavicle as well as from the angled  acromion.  At this point an acromioplasty was performed from the lateral portal.  The bur was then moved into the posterior portal and with the camera from the lateral portal, the completion of the acromioplasty was undertaken.  Attention at that time was turned to the distal clavicle which was clearly impinging the cuff and the distal clavicle was excised for a length of about 2 cm.  At that point the wound was copiously irrigated and a  prolonged probing and evaluation of the rotator cuff was undertaken because it was certainly my opinion that there was some, at least very high-grade, partial-thickness tearing.  At no area could a breach of the cuff be determined completely and the cuff above looked to be in reasonable shape.  This was probed thoroughly and very far out laterally with a camera from both the posterior and the lateral area.  It was my feeling at this point that clearly the patient has a high-grade partial thickness tear, that it is certainly possible this could go on to a full-thickness tear but I did not think that putting him through an elevation and tack-down of the rotator cuff at this point seemed appropriate.  At this point the wound was copiously irrigated with normal saline irrigation and suctioned dry.  A final check was made of the distal clavicle excision and of the acromioplasty and debridement was undertaken of the rotator cuff as well as the distal clavicle area.  At this point the arthroscopic portals were closed with a sterile and compressive dressing.  A bandage was applied and the patient was taken to the recovery room and was noted to be in satisfactory condition.   Estimated blood loss for the procedure was none. Dictated by:   Harvie Junior, M.D. Attending Physician:  Milly Jakob DD:  05/19/01 TD:  05/19/01 Job: [id number]

## 2010-09-28 ENCOUNTER — Encounter: Payer: Self-pay | Admitting: Internal Medicine

## 2010-10-01 ENCOUNTER — Encounter: Payer: Self-pay | Admitting: Internal Medicine

## 2010-10-01 DIAGNOSIS — Z0000 Encounter for general adult medical examination without abnormal findings: Secondary | ICD-10-CM | POA: Insufficient documentation

## 2010-10-03 ENCOUNTER — Encounter: Payer: Self-pay | Admitting: Internal Medicine

## 2010-10-03 ENCOUNTER — Other Ambulatory Visit (INDEPENDENT_AMBULATORY_CARE_PROVIDER_SITE_OTHER): Payer: Medicare Other

## 2010-10-03 ENCOUNTER — Ambulatory Visit (INDEPENDENT_AMBULATORY_CARE_PROVIDER_SITE_OTHER): Payer: Medicare Other | Admitting: Internal Medicine

## 2010-10-03 VITALS — BP 132/70 | HR 76 | Temp 98.5°F | Ht 72.0 in | Wt 205.0 lb

## 2010-10-03 DIAGNOSIS — N259 Disorder resulting from impaired renal tubular function, unspecified: Secondary | ICD-10-CM

## 2010-10-03 DIAGNOSIS — M545 Low back pain, unspecified: Secondary | ICD-10-CM

## 2010-10-03 DIAGNOSIS — J019 Acute sinusitis, unspecified: Secondary | ICD-10-CM

## 2010-10-03 DIAGNOSIS — F329 Major depressive disorder, single episode, unspecified: Secondary | ICD-10-CM

## 2010-10-03 DIAGNOSIS — F3289 Other specified depressive episodes: Secondary | ICD-10-CM

## 2010-10-03 DIAGNOSIS — J309 Allergic rhinitis, unspecified: Secondary | ICD-10-CM

## 2010-10-03 DIAGNOSIS — Z0000 Encounter for general adult medical examination without abnormal findings: Secondary | ICD-10-CM

## 2010-10-03 LAB — BASIC METABOLIC PANEL
BUN: 36 mg/dL — ABNORMAL HIGH (ref 6–23)
Calcium: 9.8 mg/dL (ref 8.4–10.5)
Chloride: 106 mEq/L (ref 96–112)
Creatinine, Ser: 2.1 mg/dL — ABNORMAL HIGH (ref 0.4–1.5)
Glucose, Bld: 76 mg/dL (ref 70–99)
Potassium: 4.1 mEq/L (ref 3.5–5.1)
Sodium: 142 mEq/L (ref 135–145)

## 2010-10-03 MED ORDER — AZITHROMYCIN 250 MG PO TABS: ORAL_TABLET | ORAL | Status: AC

## 2010-10-03 NOTE — Patient Instructions (Signed)
Take all new medications as prescribed Continue all other medications as before Please go to LAB in the Basement for the blood and/or urine tests to be done today Please call the phone number [phone] (the [employer] System) for results of testing in 2-3 days;  When calling, simply dial the number, and when prompted enter the MRN number above (the Medical Record Number) and the # key, then the message should start. Please call if you would like the medication (citalopram 10 mg) for depression Please return in 6 mo with Lab testing done 3-5 days before

## 2010-10-03 NOTE — Assessment & Plan Note (Signed)
Mild to mod, for antibx course,  to f/u any worsening symptoms or concerns 

## 2010-10-03 NOTE — Assessment & Plan Note (Signed)
Mild , pt prefers to re-start the allegra otc prn

## 2010-10-03 NOTE — Assessment & Plan Note (Signed)
stable overall by hx and exam, , and pt to continue medical treatment as before   

## 2010-10-03 NOTE — Assessment & Plan Note (Signed)
stable overall by hx and exam, most recent data reviewed with pt, and pt to continue medical treatment as before  For f/u bmp today  Lab Results  Component Value Date   WBC 5.2 04/04/2010   HGB 14.6 04/04/2010   HCT 43.9 04/04/2010   PLT 120.0* 04/04/2010   CHOL 117 04/04/2010   TRIG 240.0* 04/04/2010   HDL 21.90* 04/04/2010   LDLDIRECT 48.5 04/04/2010   ALT 80* 04/04/2010   AST 91* 04/04/2010   NA 143 04/04/2010   K 4.2 04/04/2010   CL 104 04/04/2010   CREATININE 1.9* 04/04/2010   BUN 32* 04/04/2010   CO2 33* 04/04/2010   TSH 2.52 04/04/2010   PSA 2.46 04/04/2010

## 2010-10-03 NOTE — Progress Notes (Signed)
Subjective:    Patient ID: Gerald Gomez, male    DOB: January 13, 1941, 70 y.o.   MRN: [id number]  HPI Pt continues to have recurring LBP without change in severity, bowel or bladder change, fever, wt loss,  worsening LE pain/numbness/weakness, gait change or falls, though can only walk about from the exam room to the parking lot most days, no change recent. Has had mild worsening depressive symptoms, but no suicidal ideation, or panic, though has ongoing anxiety, not increased recently.  overall o/w doing ok,  Pt denies chest pain, increased sob or doe, wheezing, orthopnea, PND, increased LE swelling, palpitations, dizziness or syncope.  Pt denies new neurological symptoms such as new headache, or facial or extremity weakness or numbness   Pt denies polydipsia, polyuria, or low sugar symptoms such as weakness or confusion improved with po intake.  Pt states overall good compliance with meds, trying to follow lower cholesterol,  wt overall stable but little exercise however. Does have several wks ongoing nasal allergy symptoms with clear congestion, itch and sneeze, without fever, pain, ST, cough or wheezing.   Also Here with 3 days acute onset fever, facial pain, pressure, general weakness and malaise, and greenish d/c, with slight ST, but little to no cough.  Past Medical History  Diagnosis Date  . ALLERGIC RHINITIS 03/28/2009  . ANXIETY 03/28/2009  . CHOLELITHIASIS 03/28/2009  . DEGENERATIVE JOINT DISEASE 03/28/2009  . DEPRESSION 03/28/2009  . 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 DIVERTICULOSIS, COLON 03/28/2009  . 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 FATIGUE 03/28/2009  . HEPATITIS C 03/28/2009  . HYPERLIPIDEMIA 03/28/2009  . HYPERTENSION 03/28/2009  . LOW BACK PAIN, CHRONIC 03/28/2009  . PERIPHERAL VASCULAR DISEASE 03/28/2009  . Personal history of alcoholism 03/28/2009  . RENAL INSUFFICIENCY 03/28/2009  . THROMBOCYTOPENIA 03/28/2009  . Wheezing 02/07/2010  . Gallstones    Past Surgical History  Procedure Date  .  Right shoulder surgery 2003    reports that he has been smoking Cigarettes.  He has been smoking about .25 packs per day. He does not have any smokeless tobacco history on file. He reports that he does not drink alcohol or use illicit drugs. family history includes Breast cancer in his daughter; Lung cancer in his father; and Stroke in his brother. No Known Allergies Current Outpatient Prescriptions on File Prior to Visit  Medication Sig Dispense Refill  . clorazepate (TRANXENE) 7.5 MG tablet Take 7.5 mg by mouth 3 (three) times daily.        . fexofenadine (ALLEGRA) 180 MG tablet Take 180 mg by mouth daily as needed.        Marland Kitchen ibuprofen (ADVIL,MOTRIN) 800 MG tablet Take 800 mg by mouth daily as needed.        Marland Kitchen lisinopril-hydrochlorothiazide (PRINZIDE,ZESTORETIC) 20-12.5 MG per tablet Take 1 tablet by mouth daily.        . metoprolol (LOPRESSOR) 100 MG tablet Take 100 mg by mouth 2 (two) times daily. Takes 1/2 tab twice a day       . traMADol (ULTRAM) 50 MG tablet Take 50 mg by mouth every 6 (six) hours as needed.        . traZODone (DESYREL) 50 MG tablet Take 50 mg by mouth at bedtime.         Review of Systems Review of Systems  Constitutional: Negative for diaphoresis and unexpected weight change.  HENT: Negative for drooling and tinnitus.   Eyes: Negative for photophobia and visual disturbance.  Respiratory: Negative for choking  and stridor.   Gastrointestinal: Negative for vomiting and blood in stool.  Genitourinary: Negative for hematuria and decreased urine volume.  Musculoskeletal: Negative for gait problem.        Objective:   Physical Exam BP 132/70  Pulse 76  Temp(Src) 98.5 F (36.9 C) (Oral)  Ht 6' (1.829 m)  Wt 205 lb (92.987 kg)  BMI 27.80 kg/m2  SpO2 97% Physical Exam  VS noted Constitutional: Pt appears well-developed and well-nourished.  HENT: Head: Normocephalic.  Right Ear: External ear normal.  Left Ear: External ear normal.  Bilat tm's mild erythema.   Sinus tender.  Pharynx mild erythema Eyes: Conjunctivae and EOM are normal. Pupils are equal, round, and reactive to light.  Neck: Normal range of motion. Neck supple.  Cardiovascular: Normal rate and regular rhythm.   Pulmonary/Chest: Effort normal and breath sounds normal.  Abd:  Soft, NT, non-distended, + BS Neurological: Pt is alert. No cranial nerve deficit.  Skin: Skin is warm. No erythema.  Psychiatric: Pt behavior is normal. Thought content normal. depressed affect, not nervous appearing       Assessment & Plan:

## 2010-10-03 NOTE — Assessment & Plan Note (Signed)
Mild worsneing, declines "another mediacaiton" , verified nonsuicidal,  to f/u any worsening symptoms or concerns

## 2010-10-10 ENCOUNTER — Other Ambulatory Visit: Payer: Self-pay

## 2010-10-10 MED ORDER — CLORAZEPATE DIPOTASSIUM 7.5 MG PO TABS: 7.5000 mg | ORAL_TABLET | Freq: Three times a day (TID) | ORAL | Status: DC

## 2010-10-10 NOTE — Telephone Encounter (Signed)
Faxed to patient's pharmacy.  

## 2010-10-18 ENCOUNTER — Telehealth: Payer: Self-pay | Admitting: *Deleted

## 2010-10-18 NOTE — Telephone Encounter (Signed)
Pt was seen on 10/03/2010 and had mentioned back and leg pain to Dr Jonny Ruiz. Pt was prescribed tramadol and states he cannot take this medication. He states when he take the medication it makes him sick and nauseated. What do you advise for pt ?

## 2010-10-18 NOTE — Telephone Encounter (Signed)
Kathleen informed.

## 2010-10-18 NOTE — Telephone Encounter (Signed)
Stop tramadol - use ibuprofen 800mg  as needed (on med list - ?if taking every 8 hours, ok to refill #30 if needed) - if taking ibuprofen without relief, may need to see JWJ again

## 2011-03-21 ENCOUNTER — Other Ambulatory Visit: Payer: Self-pay

## 2011-03-21 MED ORDER — IBUPROFEN 800 MG PO TABS: 800.0000 mg | ORAL_TABLET | Freq: Every day | ORAL | Status: DC | PRN

## 2011-04-03 ENCOUNTER — Encounter (HOSPITAL_COMMUNITY): Payer: Self-pay | Admitting: *Deleted

## 2011-04-03 ENCOUNTER — Emergency Department (HOSPITAL_COMMUNITY)
Admission: EM | Admit: 2011-04-03 | Discharge: 2011-04-03 | Disposition: A | Discharge status: Home / Self Care | Payer: Medicare Other | Attending: Emergency Medicine | Admitting: Emergency Medicine

## 2011-04-03 DIAGNOSIS — I10 Essential (primary) hypertension: Secondary | ICD-10-CM | POA: Insufficient documentation

## 2011-04-03 DIAGNOSIS — IMO0002 Reserved for concepts with insufficient information to code with codable children: Secondary | ICD-10-CM | POA: Insufficient documentation

## 2011-04-03 DIAGNOSIS — M129 Arthropathy, unspecified: Secondary | ICD-10-CM | POA: Insufficient documentation

## 2011-04-03 DIAGNOSIS — F17200 Nicotine dependence, unspecified, uncomplicated: Secondary | ICD-10-CM | POA: Insufficient documentation

## 2011-04-03 DIAGNOSIS — M545 Low back pain, unspecified: Secondary | ICD-10-CM | POA: Insufficient documentation

## 2011-04-03 DIAGNOSIS — M549 Dorsalgia, unspecified: Secondary | ICD-10-CM

## 2011-04-03 DIAGNOSIS — Z79899 Other long term (current) drug therapy: Secondary | ICD-10-CM | POA: Insufficient documentation

## 2011-04-03 DIAGNOSIS — I739 Peripheral vascular disease, unspecified: Secondary | ICD-10-CM | POA: Insufficient documentation

## 2011-04-03 DIAGNOSIS — F341 Dysthymic disorder: Secondary | ICD-10-CM | POA: Insufficient documentation

## 2011-04-03 DIAGNOSIS — M79609 Pain in unspecified limb: Secondary | ICD-10-CM | POA: Insufficient documentation

## 2011-04-03 DIAGNOSIS — M1990 Unspecified osteoarthritis, unspecified site: Secondary | ICD-10-CM

## 2011-04-03 MED ORDER — HYDROCODONE-ACETAMINOPHEN 5-325 MG PO TABS: 1.0000 | ORAL_TABLET | Freq: Four times a day (QID) | ORAL | Status: DC | PRN

## 2011-04-03 MED ORDER — MORPHINE SULFATE 4 MG/ML IJ SOLN: 4.0000 mg | Freq: Once | INTRAMUSCULAR | Status: DC

## 2011-04-03 MED ORDER — MORPHINE SULFATE 4 MG/ML IJ SOLN
4.0000 mg | Freq: Once | INTRAMUSCULAR | Status: AC
  Administered 2011-04-03: 4 mg via INTRAVENOUS
  Filled 2011-04-03: qty 1

## 2011-04-03 NOTE — ED Notes (Signed)
Per EMS, pt from home, reports L leg pain, has hx of arthritis.  Pt is hypertensive.  Pt is A&Ox 4.

## 2011-04-03 NOTE — ED Provider Notes (Signed)
History     CSN: [id number]  Arrival date & time 04/03/11  1406   First MD Initiated Contact with Patient 04/03/11 1523      Chief Complaint  Patient presents with  . Leg Pain    (Consider location/radiation/quality/duration/timing/severity/associated sxs/prior treatment) HPI Patient presents to the emergency department for left leg pain for the last 5 days. He states he's had pain like this is that in the past with his arthritis.  Patient denies falling, headache, numbness, weakness, abdominal pain, back pain or nausea/vomiting.  Patient states that he said problems with his lower back that causes pain in his leg as well.  Patient states that he has had issues like this in the past with his leg.         Past Medical History  Diagnosis Date  . ALLERGIC RHINITIS 03/28/2009  . ANXIETY 03/28/2009  . CHOLELITHIASIS 03/28/2009  . DEGENERATIVE JOINT DISEASE 03/28/2009  . DEPRESSION 03/28/2009  . 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 DIVERTICULOSIS, COLON 03/28/2009  . 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 FATIGUE 03/28/2009  . HEPATITIS C 03/28/2009  . HYPERLIPIDEMIA 03/28/2009  . HYPERTENSION 03/28/2009  . LOW BACK PAIN, CHRONIC 03/28/2009  . PERIPHERAL VASCULAR DISEASE 03/28/2009  . Personal history of alcoholism 03/28/2009  . RENAL INSUFFICIENCY 03/28/2009  . THROMBOCYTOPENIA 03/28/2009  . Wheezing 02/07/2010  . Gallstones     Past Surgical History  Procedure Date  . Right shoulder surgery 2003    Family History  Problem Relation Age of Onset  . Lung cancer Father   . Stroke Brother   . Breast cancer Daughter     History  Substance Use Topics  . Smoking status: Current Everyday Smoker -- 0.2 packs/day    Types: Cigarettes  . Smokeless tobacco: Not on file  . Alcohol Use: No      Review of Systems All pertinent positives and negatives reviewed in the history of present illness  Allergies  Review of patient's allergies indicates no known allergies.  Home Medications   Current  Outpatient Rx  Name Route Sig Dispense Refill  . CETIRIZINE HCL 10 MG PO TABS Oral Take 10 mg by mouth daily.      Marland Kitchen CLORAZEPATE DIPOTASSIUM 7.5 MG PO TABS Oral Take 7.5 mg by mouth 3 (three) times daily as needed. For anxiety.     . IBUPROFEN 800 MG PO TABS Oral Take 800 mg by mouth daily as needed. For pain.     Marland Kitchen LISINOPRIL-HYDROCHLOROTHIAZIDE 20-12.5 MG PO TABS Oral Take 1 tablet by mouth daily.      Marland Kitchen METOPROLOL TARTRATE 100 MG PO TABS Oral Take 50 mg by mouth 2 (two) times daily.     . TRAZODONE HCL 50 MG PO TABS Oral Take 50 mg by mouth at bedtime.      Marland Kitchen FEXOFENADINE HCL 180 MG PO TABS Oral Take 180 mg by mouth daily as needed.      Marland Kitchen TRAMADOL HCL 50 MG PO TABS Oral Take 50 mg by mouth every 6 (six) hours as needed.        BP 176/93  Pulse 67  Temp(Src) 98.6 F (37 C) (Oral)  Resp 18  SpO2 96%  Physical Exam  Constitutional: He is oriented to person, place, and time. He appears well-developed and well-nourished.  HENT:  Head: Normocephalic and atraumatic.  Eyes: Pupils are equal, round, and reactive to light.  Neck: Normal range of motion. Neck supple.  Cardiovascular: Normal rate, regular rhythm and normal heart  sounds.  Exam reveals no gallop and no friction rub.   No murmur heard. Pulmonary/Chest: Effort normal and breath sounds normal.  Musculoskeletal:       Lumbar back: He exhibits normal range of motion, no tenderness, no bony tenderness, no deformity, no pain and no spasm.       Patient has generalized pain of his left leg but no swelling or redness noted.  His pain is mostly in the anterior thigh on palpation  Neurological: He is alert and oriented to person, place, and time. He has normal strength and normal reflexes. He displays normal reflexes. Coordination and gait normal.  Skin: Skin is warm and dry.    ED Course  Procedures (including critical care time)    Patient has been stable here in the emergency department during his visit.  I have seen him  ambulate he is showing no signs of difficulty better abnormal for him he does use a cane.     Patient has no signs or neuro deficits on exam.  Patient will be treated for his chronic pain that is related to arthritis and back problems.  Patient is asked to follow up with his primary care Dr.  Catalina Pizza to return here for any worsening in his condition.   MDM   MDM Reviewed: nursing note and vitals Reviewed previous: x-ray           Carlyle Dolly, PA-C 04/03/11 1621

## 2011-04-03 NOTE — ED Provider Notes (Signed)
Medical screening examination/treatment/procedure(s) were conducted as a shared visit with non-physician practitioner(s) and myself.  I personally evaluated the patient during the encounter  Patient with no new neuro deficits on exam.  No vascular abnormalities on exam.  Patient is able to ambulate at his baseline with his cane.  Patient likely has a combination of arthritis pain and pain from his degenerative disc disease is been chronic over the last few years.  Patient to followup with his primary care physician for further evaluation of this especially since the patient does not desire any surgical intervention on his back if it was needed.  Nat Christen, MD 04/03/11 [phone]

## 2011-04-03 NOTE — ED Notes (Signed)
[id number]<AV> Expected date:04/03/11<BR> Expected time: 2:04 PM<BR> Means of arrival:Ambulance<BR> Comments:<BR> M31 - 71 yoM Leg pain, arthritis

## 2011-04-03 NOTE — ED Notes (Signed)
Pt wanted Morphine IM and given. IV site was d/c that was started by  EMS.

## 2011-04-10 ENCOUNTER — Ambulatory Visit (INDEPENDENT_AMBULATORY_CARE_PROVIDER_SITE_OTHER): Payer: Medicare Other | Admitting: Internal Medicine

## 2011-04-10 ENCOUNTER — Telehealth: Payer: Self-pay | Admitting: Internal Medicine

## 2011-04-10 ENCOUNTER — Encounter: Payer: Self-pay | Admitting: Internal Medicine

## 2011-04-10 ENCOUNTER — Other Ambulatory Visit (INDEPENDENT_AMBULATORY_CARE_PROVIDER_SITE_OTHER): Payer: Medicare Other

## 2011-04-10 VITALS — BP 130/86 | HR 70 | Temp 97.0°F | Ht 72.0 in | Wt 215.0 lb

## 2011-04-10 DIAGNOSIS — R5383 Other fatigue: Secondary | ICD-10-CM

## 2011-04-10 DIAGNOSIS — E785 Hyperlipidemia, unspecified: Secondary | ICD-10-CM

## 2011-04-10 DIAGNOSIS — F329 Major depressive disorder, single episode, unspecified: Secondary | ICD-10-CM

## 2011-04-10 DIAGNOSIS — R5381 Other malaise: Secondary | ICD-10-CM

## 2011-04-10 DIAGNOSIS — M25569 Pain in unspecified knee: Secondary | ICD-10-CM

## 2011-04-10 DIAGNOSIS — N320 Bladder-neck obstruction: Secondary | ICD-10-CM | POA: Insufficient documentation

## 2011-04-10 DIAGNOSIS — M545 Low back pain: Secondary | ICD-10-CM

## 2011-04-10 DIAGNOSIS — M25562 Pain in left knee: Secondary | ICD-10-CM | POA: Insufficient documentation

## 2011-04-10 LAB — CBC WITH DIFFERENTIAL/PLATELET
Basophils Relative: 1 % (ref 0.0–3.0)
Eosinophils Absolute: 0.1 10*3/uL (ref 0.0–0.7)
Hemoglobin: 14.6 g/dL (ref 13.0–17.0)
MCHC: 33.4 g/dL (ref 30.0–36.0)
MCV: 88.1 fl (ref 78.0–100.0)
Monocytes Absolute: 0.5 10*3/uL (ref 0.1–1.0)
Neutro Abs: 1.8 10*3/uL (ref 1.4–7.7)
Neutrophils Relative %: 39.1 % — ABNORMAL LOW (ref 43.0–77.0)
RBC: 4.94 Mil/uL (ref 4.22–5.81)

## 2011-04-10 LAB — PSA: PSA: 2.55 ng/mL (ref 0.10–4.00)

## 2011-04-10 LAB — HEPATIC FUNCTION PANEL
ALT: 76 U/L — ABNORMAL HIGH (ref 0–53)
Alkaline Phosphatase: 84 U/L (ref 39–117)
Bilirubin, Direct: 0.1 mg/dL (ref 0.0–0.3)
Total Bilirubin: 0.4 mg/dL (ref 0.3–1.2)

## 2011-04-10 LAB — BASIC METABOLIC PANEL
Chloride: 103 mEq/L (ref 96–112)
Creatinine, Ser: 1.9 mg/dL — ABNORMAL HIGH (ref 0.4–1.5)
Potassium: 4.5 mEq/L (ref 3.5–5.1)
Sodium: 142 mEq/L (ref 135–145)

## 2011-04-10 LAB — LDL CHOLESTEROL, DIRECT: Direct LDL: 23.3 mg/dL

## 2011-04-10 LAB — LIPID PANEL: Total CHOL/HDL Ratio: 4

## 2011-04-10 MED ORDER — IBUPROFEN 800 MG PO TABS: 800.0000 mg | ORAL_TABLET | Freq: Two times a day (BID) | ORAL | Status: DC | PRN

## 2011-04-10 MED ORDER — HYDROCODONE-ACETAMINOPHEN 5-325 MG PO TABS: 1.0000 | ORAL_TABLET | Freq: Four times a day (QID) | ORAL | Status: AC | PRN

## 2011-04-10 MED ORDER — CITALOPRAM HYDROBROMIDE 10 MG PO TABS: 10.0000 mg | ORAL_TABLET | Freq: Every day | ORAL | Status: DC

## 2011-04-10 NOTE — Telephone Encounter (Signed)
rx correcte - done escript

## 2011-04-10 NOTE — Telephone Encounter (Signed)
Message copied by Corwin Levins on Tue Apr 10, 2011  5:22 PM ------      Message from: Scharlene Gloss B      Created: Tue Apr 10, 2011  2:47 PM       Patient had been previously prescribed ibuprofen 800 mg bid. Most recent ibuprofen 800 mg was once daily, please advise

## 2011-04-10 NOTE — Progress Notes (Signed)
Subjective:    Patient ID: Gerald Gomez, male    DOB: 07-05-1940, 71 y.o.   MRN: [id number]  HPI  Here to f/u back pain, has chronic pain to the back, better and worse with the weather, also with just over 2 wks worsening pain to LLE now severe and mostly localized to the left knee, assoc with now leg weakness as well, tends to drag the left leg as he walks due to pain; seen in the ER - tx with "shot" and hydrocodone, ran out last night and pain this am worse than ever.  No bowel or bladder change, fever, wt loss,   or falls. No LE numbness. ER record shows ambulation ok and no neuro defecit. Pt difficult hostorian today.  Tramadol makes him sick.  States last MRI "25 yrs ago".  Last plain film for back per ortho about 2010.  Pt not wanting surgury if can be avoided.  No prior hx of knee problem  Did not take the citalopram from last visit. Does have sense of ongoing fatigue, but denies signficant hypersomnolence. Past Medical History  Diagnosis Date  . ALLERGIC RHINITIS 03/28/2009  . ANXIETY 03/28/2009  . CHOLELITHIASIS 03/28/2009  . DEGENERATIVE JOINT DISEASE 03/28/2009  . DEPRESSION 03/28/2009  . 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 DIVERTICULOSIS, COLON 03/28/2009  . 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 FATIGUE 03/28/2009  . HEPATITIS C 03/28/2009  . HYPERLIPIDEMIA 03/28/2009  . HYPERTENSION 03/28/2009  . LOW BACK PAIN, CHRONIC 03/28/2009  . PERIPHERAL VASCULAR DISEASE 03/28/2009  . Personal history of alcoholism 03/28/2009  . RENAL INSUFFICIENCY 03/28/2009  . THROMBOCYTOPENIA 03/28/2009  . Wheezing 02/07/2010  . Gallstones    Past Surgical History  Procedure Date  . Right shoulder surgery 2003    reports that he has been smoking Cigarettes.  He has been smoking about .25 packs per day. He does not have any smokeless tobacco history on file. He reports that he does not drink alcohol or use illicit drugs. family history includes Breast cancer in his daughter; Lung cancer in his father; and Stroke in his  brother. No Known Allergies Current Outpatient Prescriptions on File Prior to Visit  Medication Sig Dispense Refill  . cetirizine (ZYRTEC) 10 MG tablet Take 10 mg by mouth daily.        . clorazepate (TRANXENE) 7.5 MG tablet Take 7.5 mg by mouth 3 (three) times daily as needed. For anxiety.       . fexofenadine (ALLEGRA) 180 MG tablet Take 180 mg by mouth daily as needed.        Marland Kitchen HYDROcodone-acetaminophen (NORCO) 5-325 MG per tablet Take 1 tablet by mouth every 6 (six) hours as needed for pain.  15 tablet  0  . ibuprofen (ADVIL,MOTRIN) 800 MG tablet Take 800 mg by mouth daily as needed. For pain.       Marland Kitchen lisinopril-hydrochlorothiazide (PRINZIDE,ZESTORETIC) 20-12.5 MG per tablet Take 1 tablet by mouth daily.        . metoprolol (LOPRESSOR) 100 MG tablet Take 50 mg by mouth 2 (two) times daily.       . traZODone (DESYREL) 50 MG tablet Take 50 mg by mouth at bedtime.        . traMADol (ULTRAM) 50 MG tablet Take 50 mg by mouth every 6 (six) hours as needed.         Review of Systems Review of Systems  Constitutional: Negative for diaphoresis and unexpected weight change.  HENT: Negative for drooling and tinnitus.  Eyes: Negative for photophobia and visual disturbance.  Respiratory: Negative for choking and stridor.   Gastrointestinal: Negative for vomiting and blood in stool.  Genitourinary: Negative for hematuria and decreased urine volume.    Objective:   Physical Exam BP 130/86  Pulse 70  Temp(Src) 97 F (36.1 C) (Oral)  Ht 6' (1.829 m)  Wt 215 lb (97.523 kg)  BMI 29.16 kg/m2  SpO2 95% Physical Exam  VS noted Constitutional: Pt appears well-developed and well-nourished.  HENT: Head: Normocephalic.  Right Ear: External ear normal.  Left Ear: External ear normal.  Eyes: Conjunctivae and EOM are normal. Pupils are equal, round, and reactive to light.  Neck: Normal range of motion. Neck supple.  Cardiovascular: Normal rate and regular rhythm.   Pulmonary/Chest: Effort normal and  breath sounds normal.  Abd:  Soft, NT, non-distended, + BS Neurological: Pt is alert. No cranial nerve deficit.  Skin: Skin is warm. No erythema.  Psychiatric:  Thought content slow to respond, does not answer specific questions sometimes, somewhat tangential Left knee with small effusion, tender over medial joint line, no click or crepitus, has FROM Spine with diffuse tender lower lumbar, and most notably is simply not able to lie flat on the exam table due to lbp d   Assessment & Plan:

## 2011-04-10 NOTE — Assessment & Plan Note (Addendum)
Etiology unclear, doubt trauma or gout, but exam o/w not clear, will refill pain med, refer to ortho for further eval and tx  Note;  Time for pt history, exam, determination of diagnosis and plan for further eval and tx, review of record with wife and husband in room is > 40 min, with documentation> 50 min

## 2011-04-10 NOTE — Patient Instructions (Addendum)
Take all new medications as prescribed - the citalopram for depression Continue all other medications as before - your pain medication was refilled today You will be contacted regarding the referral for: orthopedic for your left knee and lower back pain Please go to LAB in the Basement for the blood and/or urine tests to be done today Please call the phone number [phone] (the [employer] System) for results of testing in 2-3 days;  When calling, simply dial the number, and when prompted enter the MRN number above (the Medical Record Number) and the # key, then the message should start. Please return in 6 months, or sooner if needed

## 2011-04-11 ENCOUNTER — Other Ambulatory Visit: Payer: Self-pay

## 2011-04-11 LAB — URINALYSIS, ROUTINE W REFLEX MICROSCOPIC
Ketones, ur: NEGATIVE
Leukocytes, UA: NEGATIVE
Specific Gravity, Urine: 1.015 (ref 1.000–1.030)
Total Protein, Urine: NEGATIVE
Urine Glucose: NEGATIVE
pH: 6.5 (ref 5.0–8.0)

## 2011-04-11 MED ORDER — TRAZODONE HCL 50 MG PO TABS: 50.0000 mg | ORAL_TABLET | Freq: Every day | ORAL | Status: DC

## 2011-04-11 NOTE — Telephone Encounter (Signed)
Done escript 

## 2011-04-13 ENCOUNTER — Encounter: Payer: Self-pay | Admitting: Internal Medicine

## 2011-04-13 NOTE — Assessment & Plan Note (Signed)
Ongoing severe, for pain med as above, should ideally be addressed long term as well but left knee is more pressing issue at this time, will try to have ortho eval but think left knee issue more likely to need attention as this may be more amenable to improvement

## 2011-04-13 NOTE — Assessment & Plan Note (Signed)
Etiology unclear, Exam otherwise benign, to check labs as documented, follow with expectant management  

## 2011-04-13 NOTE — Assessment & Plan Note (Signed)
Also for psa today as he is due, asympt,  to f/u any worsening symptoms or concerns

## 2011-04-13 NOTE — Assessment & Plan Note (Addendum)
stable overall by hx and exam, , and pt to continue medical treatment as before, goal ldl < 100, for f/u lipids today

## 2011-04-13 NOTE — Assessment & Plan Note (Signed)
Pt is very poor historian, I suspect underlying depression, will add citalopram 10 qd, declines counseling, for f/u next visit, verified nonsuicidal

## 2011-04-23 ENCOUNTER — Ambulatory Visit: Payer: PRIVATE HEALTH INSURANCE | Admitting: Internal Medicine

## 2011-05-07 ENCOUNTER — Other Ambulatory Visit: Payer: Self-pay

## 2011-05-07 MED ORDER — LISINOPRIL-HYDROCHLOROTHIAZIDE 20-12.5 MG PO TABS: 1.0000 | ORAL_TABLET | Freq: Every day | ORAL | Status: DC

## 2011-05-10 ENCOUNTER — Telehealth: Payer: Self-pay | Admitting: Internal Medicine

## 2011-05-10 MED ORDER — HYDROCODONE-ACETAMINOPHEN 5-325 MG PO TABS: 1.0000 | ORAL_TABLET | Freq: Four times a day (QID) | ORAL | Status: AC | PRN

## 2011-05-10 NOTE — Telephone Encounter (Signed)
Faxed hardcopy to [facility] per patient request.

## 2011-05-10 NOTE — Telephone Encounter (Signed)
Done hardcopy to [facility]  

## 2011-05-10 NOTE — Telephone Encounter (Signed)
The pt called and is requesting a refill of Hydrocodone-acetaminophen 5-325mg , sent to [facility].   Thanks!

## 2011-06-18 ENCOUNTER — Telehealth: Payer: Self-pay | Admitting: Internal Medicine

## 2011-06-18 NOTE — Telephone Encounter (Signed)
Per wife pt need clarification on taking ibuprofen---pt [phone]

## 2011-06-18 NOTE — Telephone Encounter (Signed)
Ok for ibuprofen 800 bid prn, but at last visit pt promised to NOT take on a regular basis

## 2011-06-18 NOTE — Telephone Encounter (Signed)
Patient informed. 

## 2011-06-25 ENCOUNTER — Other Ambulatory Visit: Payer: Self-pay

## 2011-06-25 MED ORDER — METOPROLOL TARTRATE 100 MG PO TABS: 50.0000 mg | ORAL_TABLET | Freq: Two times a day (BID) | ORAL | Status: DC

## 2011-07-05 ENCOUNTER — Other Ambulatory Visit: Payer: Self-pay

## 2011-07-05 MED ORDER — CLORAZEPATE DIPOTASSIUM 7.5 MG PO TABS: 7.5000 mg | ORAL_TABLET | Freq: Three times a day (TID) | ORAL | Status: DC | PRN

## 2011-07-05 NOTE — Telephone Encounter (Signed)
Faxed hardcopy to pharmacy. 

## 2011-07-05 NOTE — Telephone Encounter (Signed)
Done hardcopy to [facility]  

## 2011-10-04 ENCOUNTER — Other Ambulatory Visit: Payer: Self-pay | Admitting: Internal Medicine

## 2011-10-05 NOTE — Telephone Encounter (Signed)
Faxed hardcopy to pharmacy. 

## 2011-10-05 NOTE — Telephone Encounter (Signed)
Done hardcopy to [facility]  

## 2011-10-09 ENCOUNTER — Ambulatory Visit (INDEPENDENT_AMBULATORY_CARE_PROVIDER_SITE_OTHER): Payer: Medicare Other | Admitting: Internal Medicine

## 2011-10-09 ENCOUNTER — Encounter: Payer: Self-pay | Admitting: Internal Medicine

## 2011-10-09 VITALS — BP 104/68 | HR 70 | Temp 97.7°F | Ht 72.0 in | Wt 210.2 lb

## 2011-10-09 DIAGNOSIS — D696 Thrombocytopenia, unspecified: Secondary | ICD-10-CM

## 2011-10-09 DIAGNOSIS — I10 Essential (primary) hypertension: Secondary | ICD-10-CM

## 2011-10-09 DIAGNOSIS — N259 Disorder resulting from impaired renal tubular function, unspecified: Secondary | ICD-10-CM

## 2011-10-09 DIAGNOSIS — E785 Hyperlipidemia, unspecified: Secondary | ICD-10-CM

## 2011-10-09 MED ORDER — TADALAFIL 20 MG PO TABS: 20.0000 mg | ORAL_TABLET | Freq: Every day | ORAL | Status: DC | PRN

## 2011-10-09 NOTE — Progress Notes (Signed)
Subjective:    Patient ID: Gerald Gomez, male    DOB: [birth year], 71 y.o.   MRN: [id number]  HPI  Here to f/u; overall doing ok,  Pt denies chest pain, increased sob or doe, wheezing, orthopnea, PND, increased LE swelling, palpitations, dizziness or syncope.  Pt denies new neurological symptoms such as new headache, or facial or extremity weakness or numbness   Pt denies polydipsia, polyuria  Pt states overall good compliance with meds, trying to follow lower cholesterol diet, wt overall stable but little exercise however.  Left leg pain improved after PT and med tx.  Not following lower fat diet.  Has known lower plts, no recent bruising or bleeding.   Pt denies fever, wt loss, night sweats, loss of appetite, or other constitutional symptoms  Denies worsening depressive symptoms, suicidal ideation, or panic. Past Medical History  Diagnosis Date  . ALLERGIC RHINITIS 03/28/2009  . ANXIETY 03/28/2009  . CHOLELITHIASIS 03/28/2009  . DEGENERATIVE JOINT DISEASE 03/28/2009  . DEPRESSION 03/28/2009  . 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 DIVERTICULOSIS, COLON 03/28/2009  . 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 FATIGUE 03/28/2009  . HEPATITIS C 03/28/2009  . HYPERLIPIDEMIA 03/28/2009  . HYPERTENSION 03/28/2009  . LOW BACK PAIN, CHRONIC 03/28/2009  . PERIPHERAL VASCULAR DISEASE 03/28/2009  . Personal history of alcoholism 03/28/2009  . RENAL INSUFFICIENCY 03/28/2009  . THROMBOCYTOPENIA 03/28/2009  . Wheezing 02/07/2010  . Gallstones    Past Surgical History  Procedure Date  . Right shoulder surgery 2003    reports that he has been smoking Cigarettes.  He has been smoking about .25 packs per day. He does not have any smokeless tobacco history on file. He reports that he does not drink alcohol or use illicit drugs. family history includes Breast cancer in his daughter; Lung cancer in his father; and Stroke in his brother. Allergies  Allergen Reactions  . Tramadol Nausea Only   Current Outpatient Prescriptions on File  Prior to Visit  Medication Sig Dispense Refill  . cetirizine (ZYRTEC) 10 MG tablet Take 10 mg by mouth daily.        . citalopram (CELEXA) 10 MG tablet Take 1 tablet (10 mg total) by mouth daily.  30 tablet  11  . clorazepate (TRANXENE) 7.5 MG tablet Take 1 tablet (7.5 mg total) by mouth 3 (three) times daily as needed. For anxiety.  30 tablet  1  . fexofenadine (ALLEGRA) 180 MG tablet Take 180 mg by mouth daily as needed.        Marland Kitchen HYDROcodone-acetaminophen (NORCO) 5-325 MG per tablet TAKE 1 TABLET EVERY 6 HR AS NEEDED FOR PAIN  60 tablet  1  . ibuprofen (ADVIL,MOTRIN) 800 MG tablet Take 1 tablet (800 mg total) by mouth 2 (two) times daily as needed. For pain.  60 tablet  3  . lisinopril-hydrochlorothiazide (PRINZIDE,ZESTORETIC) 20-12.5 MG per tablet Take 1 tablet by mouth daily.  90 tablet  3  . metoprolol (LOPRESSOR) 100 MG tablet Take 0.5 tablets (50 mg total) by mouth 2 (two) times daily.  100 tablet  3  . traZODone (DESYREL) 50 MG tablet Take 1 tablet (50 mg total) by mouth at bedtime.  90 tablet  1  . tadalafil (CIALIS) 20 MG tablet Take 1 tablet (20 mg total) by mouth daily as needed for erectile dysfunction.  10 tablet  11   Review of Systems Constitutional: Negative for diaphoresis and unexpected weight change.  HENT: Negative for tinnitus.   Eyes: Negative for photophobia and visual  disturbance.  Respiratory: Negative for choking and stridor.   Gastrointestinal: Negative for vomiting and blood in stool.  Genitourinary: Negative for hematuria and decreased urine volume.  Musculoskeletal: Negative for gait problem.  Skin: Negative for color change and wound.  Neurological: Negative for tremors and numbness.  Objective:   Physical Exam BP 104/68  Pulse 70  Temp 97.7 F (36.5 C) (Oral)  Ht 6' (1.829 m)  Wt 210 lb 4 oz (95.369 kg)  BMI 28.52 kg/m2  SpO2 96% Physical Exam  VS noted Constitutional: Pt appears well-developed and well-nourished.  HENT: Head: Normocephalic.  Right  Ear: External ear normal.  Left Ear: External ear normal.  Eyes: Conjunctivae and EOM are normal. Pupils are equal, round, and reactive to light.  Neck: Normal range of motion. Neck supple.  Cardiovascular: Normal rate and regular rhythm.   Pulmonary/Chest: Effort normal and breath sounds normal.  Abd:  Soft, NT, non-distended, + BS Neurological: Pt is alert. Not confused Skin: Skin is warm. No erythema.  Psychiatric: Pt behavior is normal. Thought content normal. not nervous or depressed affect    Assessment & Plan:

## 2011-10-09 NOTE — Patient Instructions (Addendum)
Take all new medications as prescribed - the cialis Continue all other medications as before Please go to LAB in the Basement for the blood and/or urine tests to be done today You will be contacted by phone if any changes need to be made immediately.  Otherwise, you will receive a letter about your results with an explanation. Please return in 6 months, or sooner if needed

## 2011-10-21 ENCOUNTER — Encounter: Payer: Self-pay | Admitting: Internal Medicine

## 2011-10-21 NOTE — Assessment & Plan Note (Signed)
stable overall by hx and exam, most recent data reviewed with pt, and pt to continue medical treatment as before Lab Results  Component Value Date   PLT 88.0* 04/10/2011

## 2011-10-21 NOTE — Assessment & Plan Note (Signed)
stable overall by hx and exam, most recent data reviewed with pt, and pt to continue medical treatment as before  Last LDL  23 - jan 2013

## 2011-10-21 NOTE — Assessment & Plan Note (Signed)
stable overall by hx and exam, most recent data reviewed with pt, and pt to continue medical treatment as before Lab Results  Component Value Date   WBC 4.6 04/10/2011   HGB 14.6 04/10/2011   HCT 43.5 04/10/2011   PLT 88.0* 04/10/2011   GLUCOSE 90 04/10/2011   CHOL 110 04/10/2011   TRIG 470.0 Triglyceride is over 400; calculations on Lipids are invalid.* 04/10/2011   HDL 24.60* 04/10/2011   LDLDIRECT 23.3 04/10/2011   ALT 76* 04/10/2011   AST 90* 04/10/2011   NA 142 04/10/2011   K 4.5 04/10/2011   CL 103 04/10/2011   CREATININE 1.9* 04/10/2011   BUN 31* 04/10/2011   CO2 31 04/10/2011   TSH 1.37 04/10/2011   PSA 2.55 04/10/2011   INR 1.1 12/24/2008    

## 2011-10-21 NOTE — Assessment & Plan Note (Signed)
stable overall by hx and exam, most recent data reviewed with pt, and pt to continue medical treatment as before BP Readings from Last 3 Encounters:  10/09/11 104/68  04/10/11 130/86  04/03/11 168/82

## 2011-11-11 ENCOUNTER — Other Ambulatory Visit: Payer: Self-pay | Admitting: Internal Medicine

## 2011-11-12 NOTE — Telephone Encounter (Signed)
Faxed hardcopy to pharmacy. 

## 2011-11-12 NOTE — Telephone Encounter (Signed)
Done hardcopy to [facility]  

## 2011-11-23 ENCOUNTER — Other Ambulatory Visit (INDEPENDENT_AMBULATORY_CARE_PROVIDER_SITE_OTHER): Payer: Medicare Other

## 2011-11-23 ENCOUNTER — Encounter: Payer: Self-pay | Admitting: Internal Medicine

## 2011-11-23 ENCOUNTER — Ambulatory Visit (INDEPENDENT_AMBULATORY_CARE_PROVIDER_SITE_OTHER): Payer: Medicare Other | Admitting: Internal Medicine

## 2011-11-23 VITALS — BP 132/70 | HR 73 | Temp 98.1°F | Ht 72.0 in | Wt 210.4 lb

## 2011-11-23 DIAGNOSIS — I10 Essential (primary) hypertension: Secondary | ICD-10-CM

## 2011-11-23 DIAGNOSIS — N3281 Overactive bladder: Secondary | ICD-10-CM | POA: Insufficient documentation

## 2011-11-23 DIAGNOSIS — R350 Frequency of micturition: Secondary | ICD-10-CM

## 2011-11-23 DIAGNOSIS — D696 Thrombocytopenia, unspecified: Secondary | ICD-10-CM

## 2011-11-23 DIAGNOSIS — E785 Hyperlipidemia, unspecified: Secondary | ICD-10-CM

## 2011-11-23 DIAGNOSIS — N259 Disorder resulting from impaired renal tubular function, unspecified: Secondary | ICD-10-CM

## 2011-11-23 DIAGNOSIS — F329 Major depressive disorder, single episode, unspecified: Secondary | ICD-10-CM

## 2011-11-23 LAB — URINALYSIS, ROUTINE W REFLEX MICROSCOPIC
Ketones, ur: NEGATIVE
Urine Glucose: NEGATIVE

## 2011-11-23 LAB — LIPID PANEL
Cholesterol: 111 mg/dL (ref 0–200)
HDL: 29.6 mg/dL — ABNORMAL LOW (ref >39.00)
VLDL: 66 mg/dL — ABNORMAL HIGH (ref 0.0–40.0)

## 2011-11-23 LAB — BASIC METABOLIC PANEL
Calcium: 9.3 mg/dL (ref 8.4–10.5)
GFR: 39.01 mL/min — ABNORMAL LOW (ref >60.00)
Glucose, Bld: 84 mg/dL (ref 70–99)
Potassium: 4.2 mEq/L (ref 3.5–5.1)
Sodium: 141 mEq/L (ref 135–145)

## 2011-11-23 LAB — CBC WITH DIFFERENTIAL/PLATELET
Basophils Absolute: 0 10*3/uL (ref 0.0–0.1)
Eosinophils Absolute: 0.1 10*3/uL (ref 0.0–0.7)
Eosinophils Relative: 2.5 % (ref 0.0–5.0)
HCT: 39.9 % (ref 39.0–52.0)
Lymphs Abs: 2.4 10*3/uL (ref 0.7–4.0)
MCHC: 32.2 g/dL (ref 30.0–36.0)
MCV: 89.2 fl (ref 78.0–100.0)
Monocytes Absolute: 0.8 10*3/uL (ref 0.1–1.0)
Neutrophils Relative %: 42 % — ABNORMAL LOW (ref 43.0–77.0)
Platelets: 91 10*3/uL — ABNORMAL LOW (ref 150.0–400.0)
RDW: 13.6 % (ref 11.5–14.6)

## 2011-11-23 LAB — LDL CHOLESTEROL, DIRECT: Direct LDL: 35.1 mg/dL

## 2011-11-23 MED ORDER — TADALAFIL 20 MG PO TABS: 20.0000 mg | ORAL_TABLET | Freq: Every day | ORAL | Status: DC | PRN

## 2011-11-23 MED ORDER — TAMSULOSIN HCL 0.4 MG PO CAPS: 0.4000 mg | ORAL_CAPSULE | Freq: Every day | ORAL | Status: DC

## 2011-11-23 NOTE — Assessment & Plan Note (Signed)
With slow stream, likely prostate related/BPH - for flomax asd, check UA and refer urology

## 2011-11-23 NOTE — Assessment & Plan Note (Signed)
stable overall by hx and exam, most recent data reviewed with pt, and pt to continue medical treatment as before Lab Results  Component Value Date   WBC 4.6 04/10/2011   HGB 14.6 04/10/2011   HCT 43.5 04/10/2011   PLT 88.0* 04/10/2011   GLUCOSE 90 04/10/2011   CHOL 110 04/10/2011   TRIG 470.0 Triglyceride is over 400; calculations on Lipids are invalid.* 04/10/2011   HDL 24.60* 04/10/2011   LDLDIRECT 23.3 04/10/2011   ALT 76* 04/10/2011   AST 90* 04/10/2011   NA 142 04/10/2011   K 4.5 04/10/2011   CL 103 04/10/2011   CREATININE 1.9* 04/10/2011   BUN 31* 04/10/2011   CO2 31 04/10/2011   TSH 1.37 04/10/2011   PSA 2.55 04/10/2011   INR 1.1 12/24/2008

## 2011-11-23 NOTE — Progress Notes (Signed)
Subjective:    Patient ID: Gerald Gomez, male    DOB: Aug 02, 1940, 71 y.o.   MRN: [id number]  HPI  Here with urinary freq, worse now for 3-4 months, gradually getting worse, getting up now at night especially bothersome 3-4 times per night;  Now fever, does not feel ill, has weak stream but not hard to get started; has some urgency, no frank blood, no dysuria except for ? Stinging//tingling at end urination; no back or flank pain except for Pt continues to have recurring LBP without change in severity, bowel or bladder change, fever, wt loss,  worsening LE pain/numbness/weakness, gait change or falls.  Last PSA 2.5 in jan 2013, with prior 2.4 the yr before.  No recnet med changes incluidng fluid pills, has not increase his fluid intake, not more caffeine.  Not seen urology before.  No hx of DM, or OAB or malignancy.  Pt denies chest pain, increased sob or doe, wheezing, orthopnea, PND, increased LE swelling, palpitations, dizziness or syncope.  Pt denies new neurological symptoms such as new headache, or facial or extremity weakness or numbness   Pt denies polydipsia, polyuria.  Denies worsening depressive symptoms, suicidal ideation, or panic.  No worsening overall anxiety Past Medical History  Diagnosis Date  . ALLERGIC RHINITIS 03/28/2009  . ANXIETY 03/28/2009  . CHOLELITHIASIS 03/28/2009  . DEGENERATIVE JOINT DISEASE 03/28/2009  . DEPRESSION 03/28/2009  . 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 DIVERTICULOSIS, COLON 03/28/2009  . 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 FATIGUE 03/28/2009  . HEPATITIS C 03/28/2009  . HYPERLIPIDEMIA 03/28/2009  . HYPERTENSION 03/28/2009  . LOW BACK PAIN, CHRONIC 03/28/2009  . PERIPHERAL VASCULAR DISEASE 03/28/2009  . Personal history of alcoholism 03/28/2009  . RENAL INSUFFICIENCY 03/28/2009  . THROMBOCYTOPENIA 03/28/2009  . Wheezing 02/07/2010  . Gallstones    Past Surgical History  Procedure Date  . Right shoulder surgery 2003    reports that he has been smoking Cigarettes.  He has  been smoking about .25 packs per day. He does not have any smokeless tobacco history on file. He reports that he does not drink alcohol or use illicit drugs. family history includes Breast cancer in his daughter; Lung cancer in his father; and Stroke in his brother. Allergies  Allergen Reactions  . Tramadol Nausea Only   Current Outpatient Prescriptions on File Prior to Visit  Medication Sig Dispense Refill  . cetirizine (ZYRTEC) 10 MG tablet Take 10 mg by mouth daily.        . citalopram (CELEXA) 10 MG tablet Take 1 tablet (10 mg total) by mouth daily.  30 tablet  11  . clorazepate (TRANXENE) 7.5 MG tablet TAKE 1 TABLET 3 TIMES    DAILY AS NEEDED  90 tablet  2  . fexofenadine (ALLEGRA) 180 MG tablet Take 180 mg by mouth daily as needed.        Marland Kitchen HYDROcodone-acetaminophen (NORCO) 5-325 MG per tablet TAKE 1 TABLET EVERY 6 HR AS NEEDED FOR PAIN  60 tablet  1  . ibuprofen (ADVIL,MOTRIN) 800 MG tablet TAKE 1 TABLET TWICE DAILYAS NEEDED FOR PAIN  60 tablet  1  . lisinopril-hydrochlorothiazide (PRINZIDE,ZESTORETIC) 20-12.5 MG per tablet Take 1 tablet by mouth daily.  90 tablet  3  . metoprolol (LOPRESSOR) 100 MG tablet Take 0.5 tablets (50 mg total) by mouth 2 (two) times daily.  100 tablet  3  . traZODone (DESYREL) 50 MG tablet Take 1 tablet (50 mg total) by mouth at bedtime.  90 tablet  1  . tadalafil (CIALIS) 20 MG tablet Take 1 tablet (20 mg total) by mouth daily as needed for erectile dysfunction.  10 tablet  11   Review of Systems Constitutional: Negative for diaphoresis and unexpected weight change.  HENT: Negative for tinnitus.   Eyes: Negative for photophobia and visual disturbance.  Respiratory: Negative for choking and stridor.   Gastrointestinal: Negative for vomiting and blood in stool.  Genitourinary: Negative for hematuria and decreased urine volume.  Musculoskeletal: Negative for gait problem.  Skin: Negative for color change and wound.  Objective:   Physical Exam BP 132/70   Pulse 73  Temp 98.1 F (36.7 C) (Oral)  Ht 6' (1.829 m)  Wt 210 lb 6 oz (95.425 kg)  BMI 28.53 kg/m2  SpO2 97% Physical Exam  VS noted, not ill appearing Constitutional: Pt appears well-developed and well-nourished.  HENT: Head: Normocephalic.  Right Ear: External ear normal.  Left Ear: External ear normal.  Eyes: Conjunctivae and EOM are normal. Pupils are equal, round, and reactive to light.  Neck: Normal range of motion. Neck supple.  Cardiovascular: Normal rate and regular rhythm.   Pulmonary/Chest: Effort normal and breath sounds normal.  Abd:  Soft, NT, non-distended, + BS, no flank pain Neurological: Pt is alert. Not confused Skin: Skin is warm. No erythema. No rash Psychiatric: Pt behavior is normal. Thought content normal. not depressed affect    Assessment & Plan:

## 2011-11-23 NOTE — Assessment & Plan Note (Signed)
stable overall by hx and exam, most recent data reviewed with pt, and pt to continue medical treatment as before BP Readings from Last 3 Encounters:  11/23/11 132/70  10/09/11 104/68  04/10/11 130/86

## 2011-11-23 NOTE — Patient Instructions (Addendum)
Take all new medications as prescribed  - the new flomax once per day for the prostate (generic, sent to your pharmacy) Continue all other medications as before Please go to LAB in the Basement for the urine tests to be done today to make sure there is no infection or sugar involved You will be contacted regarding the referral for: urology to further check the prostate and urinary system

## 2011-11-27 ENCOUNTER — Encounter: Payer: Self-pay | Admitting: Internal Medicine

## 2011-12-20 ENCOUNTER — Other Ambulatory Visit: Payer: Self-pay | Admitting: Internal Medicine

## 2011-12-21 ENCOUNTER — Other Ambulatory Visit: Payer: Self-pay | Admitting: Internal Medicine

## 2011-12-21 NOTE — Telephone Encounter (Signed)
Done hardcopy to [facility]  

## 2011-12-21 NOTE — Telephone Encounter (Signed)
Faxed hardcopy to pharmacy. 

## 2012-01-08 ENCOUNTER — Other Ambulatory Visit: Payer: Self-pay | Admitting: Internal Medicine

## 2012-03-10 ENCOUNTER — Other Ambulatory Visit: Payer: Self-pay | Admitting: Internal Medicine

## 2012-03-21 ENCOUNTER — Other Ambulatory Visit: Payer: Self-pay | Admitting: Internal Medicine

## 2012-03-21 NOTE — Telephone Encounter (Signed)
Gerald Gomez, OK to refill. I have printed it out and placed it on your desk [facility]

## 2012-03-21 NOTE — Telephone Encounter (Signed)
Faxed back to [facility]...lmb

## 2012-03-21 NOTE — Telephone Encounter (Signed)
MD out of office. Is this ok to refill.../lmb 

## 2012-04-08 ENCOUNTER — Encounter: Payer: Self-pay | Admitting: Internal Medicine

## 2012-04-08 ENCOUNTER — Other Ambulatory Visit (INDEPENDENT_AMBULATORY_CARE_PROVIDER_SITE_OTHER): Payer: Medicare Other

## 2012-04-08 ENCOUNTER — Other Ambulatory Visit: Payer: Self-pay | Admitting: Internal Medicine

## 2012-04-08 ENCOUNTER — Ambulatory Visit (INDEPENDENT_AMBULATORY_CARE_PROVIDER_SITE_OTHER): Payer: Medicare Other | Admitting: Internal Medicine

## 2012-04-08 VITALS — BP 140/90 | HR 77 | Temp 98.0°F | Ht 72.0 in | Wt 212.4 lb

## 2012-04-08 DIAGNOSIS — J309 Allergic rhinitis, unspecified: Secondary | ICD-10-CM

## 2012-04-08 DIAGNOSIS — M545 Low back pain: Secondary | ICD-10-CM

## 2012-04-08 DIAGNOSIS — I10 Essential (primary) hypertension: Secondary | ICD-10-CM

## 2012-04-08 DIAGNOSIS — N189 Chronic kidney disease, unspecified: Secondary | ICD-10-CM

## 2012-04-08 DIAGNOSIS — N259 Disorder resulting from impaired renal tubular function, unspecified: Secondary | ICD-10-CM

## 2012-04-08 DIAGNOSIS — R062 Wheezing: Secondary | ICD-10-CM | POA: Insufficient documentation

## 2012-04-08 LAB — CBC WITH DIFFERENTIAL/PLATELET
Basophils Relative: 0.7 % (ref 0.0–3.0)
Eosinophils Absolute: 0.1 10*3/uL (ref 0.0–0.7)
Eosinophils Relative: 2.8 % (ref 0.0–5.0)
HCT: 37 % — ABNORMAL LOW (ref 39.0–52.0)
Hemoglobin: 12.1 g/dL — ABNORMAL LOW (ref 13.0–17.0)
Lymphs Abs: 2.1 10*3/uL (ref 0.7–4.0)
MCHC: 32.6 g/dL (ref 30.0–36.0)
MCV: 87.7 fl (ref 78.0–100.0)
Monocytes Absolute: 0.6 10*3/uL (ref 0.1–1.0)
Neutro Abs: 2.3 10*3/uL (ref 1.4–7.7)
RBC: 4.22 Mil/uL (ref 4.22–5.81)

## 2012-04-08 LAB — BASIC METABOLIC PANEL
BUN: 51 mg/dL — ABNORMAL HIGH (ref 6–23)
Creatinine, Ser: 2.3 mg/dL — ABNORMAL HIGH (ref 0.4–1.5)
GFR: 36.23 mL/min — ABNORMAL LOW (ref >60.00)
Glucose, Bld: 107 mg/dL — ABNORMAL HIGH (ref 70–99)

## 2012-04-08 MED ORDER — METOPROLOL TARTRATE 100 MG PO TABS: 50.0000 mg | ORAL_TABLET | Freq: Two times a day (BID) | ORAL | Status: DC

## 2012-04-08 MED ORDER — LISINOPRIL-HYDROCHLOROTHIAZIDE 20-12.5 MG PO TABS: 1.0000 | ORAL_TABLET | Freq: Every day | ORAL | Status: DC

## 2012-04-08 MED ORDER — HYDROCODONE-ACETAMINOPHEN 5-325 MG PO TABS: ORAL_TABLET | ORAL | Status: DC

## 2012-04-08 MED ORDER — CITALOPRAM HYDROBROMIDE 10 MG PO TABS: 10.0000 mg | ORAL_TABLET | Freq: Every day | ORAL | Status: DC

## 2012-04-08 MED ORDER — FLUTICASONE PROPIONATE 50 MCG/ACT NA SUSP: 2.0000 | Freq: Every day | NASAL | Status: DC

## 2012-04-08 MED ORDER — ALBUTEROL SULFATE HFA 108 (90 BASE) MCG/ACT IN AERS: 2.0000 | INHALATION_SPRAY | Freq: Four times a day (QID) | RESPIRATORY_TRACT | Status: DC | PRN

## 2012-04-08 NOTE — Assessment & Plan Note (Signed)
stable overall by history and exam,  and pt to continue medical treatment as before,  to f/u any worsening symptoms or concerns, for med refill 

## 2012-04-08 NOTE — Assessment & Plan Note (Signed)
Mild to mod, for add flonase asd,  to f/u any worsening symptoms or concerns 

## 2012-04-08 NOTE — Patient Instructions (Addendum)
Please take all new medication as prescribed - the inhaler if needed for lungs (remember to get the spacer at the drugstore as well to help the inhalation), and the flonase generic nasal spray for the allergies OK to stop the zyrtec Please continue all other medications as before, and refills have been done if requested (including the pain medication, and the metoprolol was corrected) Please go to the LAB in the Basement (turn left off the elevator) for the tests to be done today You will be contacted by phone if any changes need to be made immediately.  Otherwise, you will receive a letter about your results with an explanation Please remember to sign up for My Chart if you have not done so, as this will be important to you in the future with finding out test results, communicating by private email, and scheduling acute appointments online when needed. Please return in 6 months, or sooner if needed, with Lab testing done 3-5 days before Remember, if the kidney function is worsening, we may need to refer to a kidney specialist

## 2012-04-08 NOTE — Assessment & Plan Note (Signed)
ECG reviewed as per emr, stable overall by history and exam, recent data reviewed with pt, and pt to continue medical treatment as before,  to f/u any worsening symptoms or concerns  

## 2012-04-08 NOTE — Assessment & Plan Note (Signed)
Exam benign, ? Intermittent mild asthma - ok for alb mdi prn

## 2012-04-09 ENCOUNTER — Encounter: Payer: Self-pay | Admitting: Internal Medicine

## 2012-04-09 NOTE — Assessment & Plan Note (Signed)
For f/u bmet, volume ok today

## 2012-04-09 NOTE — Progress Notes (Signed)
Subjective:    Patient ID: Gerald Gomez, male    DOB: 02-21-1941, 72 y.o.   MRN: [id number]  HPI  Here with family to f/u;  Does have several wks ongoing nasal allergy symptoms with clearish congestion, itch and sneezing, without fever, pain, ST, but has had cough, mild wheeze worse with talk and laughing.  Zyrtec not working.  Pt denies chest pain, increased sob or doe, wheezing, orthopnea, PND, increased LE swelling, palpitations, dizziness or syncope.  Pt denies polydipsia, polyuria, Pt denies new neurological symptoms such as new headache, or facial or extremity weakness or numbness.   Pt states overall good compliance with meds, has been trying to follow lower cholesterol, diabetic diet, with overall stable,  but little exercise however.   Pt denies fever, wt loss, night sweats, loss of appetite, or other constitutional symptoms  Pt continues to have recurring LBP without change in severity, bowel or bladder change, fever, wt loss,  worsening LE pain/numbness/weakness, gait change or falls. Past Medical History  Diagnosis Date  . ALLERGIC RHINITIS 03/28/2009  . ANXIETY 03/28/2009  . CHOLELITHIASIS 03/28/2009  . DEGENERATIVE JOINT DISEASE 03/28/2009  . DEPRESSION 03/28/2009  . 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 DIVERTICULOSIS, COLON 03/28/2009  . 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 FATIGUE 03/28/2009  . HEPATITIS C 03/28/2009  . HYPERLIPIDEMIA 03/28/2009  . HYPERTENSION 03/28/2009  . LOW BACK PAIN, CHRONIC 03/28/2009  . PERIPHERAL VASCULAR DISEASE 03/28/2009  . Personal history of alcoholism 03/28/2009  . RENAL INSUFFICIENCY 03/28/2009  . THROMBOCYTOPENIA 03/28/2009  . Wheezing 02/07/2010  . Gallstones    Past Surgical History  Procedure Date  . Right shoulder surgery 2003    reports that he has been smoking Cigarettes.  He has been smoking about .25 packs per day. He does not have any smokeless tobacco history on file. He reports that he does not drink alcohol or use illicit drugs. family history  includes Breast cancer in his daughter; Lung cancer in his father; and Stroke in his brother. Allergies  Allergen Reactions  . Tramadol Nausea Only   Current Outpatient Prescriptions on File Prior to Visit  Medication Sig Dispense Refill  . citalopram (CELEXA) 10 MG tablet Take 1 tablet (10 mg total) by mouth daily.  90 tablet  3  . clorazepate (TRANXENE) 7.5 MG tablet TAKE 1 TABLET 3 TIMES    DAILY AS NEEDED  90 tablet  2  . fexofenadine (ALLEGRA) 180 MG tablet Take 180 mg by mouth daily as needed.        Marland Kitchen ibuprofen (ADVIL,MOTRIN) 800 MG tablet take 1 tablet by mouth twice a day if needed for pain  60 tablet  1  . lisinopril-hydrochlorothiazide (PRINZIDE,ZESTORETIC) 20-12.5 MG per tablet Take 1 tablet by mouth daily.  90 tablet  3  . metoprolol (LOPRESSOR) 100 MG tablet Take 0.5 tablets (50 mg total) by mouth 2 (two) times daily.  90 tablet  3  . Tamsulosin HCl (FLOMAX) 0.4 MG CAPS Take 1 capsule (0.4 mg total) by mouth daily.  90 capsule  3  . traZODone (DESYREL) 50 MG tablet Take 1 tablet (50 mg total) by mouth at bedtime.  90 tablet  1  . albuterol (PROVENTIL HFA;VENTOLIN HFA) 108 (90 BASE) MCG/ACT inhaler Inhale 2 puffs into the lungs every 6 (six) hours as needed for wheezing or shortness of breath.  1 Inhaler  5  . fluticasone (FLONASE) 50 MCG/ACT nasal spray Place 2 sprays into the nose daily.  16 g  4  .  tadalafil (CIALIS) 20 MG tablet Take 1 tablet (20 mg total) by mouth daily as needed for erectile dysfunction.  10 tablet  11    Review of Systems  Constitutional: Negative for unexpected weight change, or unusual diaphoresis  HENT: Negative for tinnitus.   Eyes: Negative for photophobia and visual disturbance.  Respiratory: Negative for choking and stridor.   Gastrointestinal: Negative for vomiting and blood in stool.  Genitourinary: Negative for hematuria and decreased urine volume.  Musculoskeletal: Negative for acute joint swelling Skin: Negative for color change and wound.    Neurological: Negative for tremors and numbness other than noted  Psychiatric/Behavioral: Negative for decreased concentration or  hyperactivity.       Objective:   Physical Exam BP 140/90  Pulse 77  Temp 98 F (36.7 C) (Oral)  Ht 6' (1.829 m)  Wt 212 lb 6 oz (96.333 kg)  BMI 28.80 kg/m2  SpO2 95% VS noted,  Constitutional: Pt appears well-developed and well-nourished.  HENT: Head: NCAT.  Right Ear: External ear normal.  Left Ear: External ear normal.  Bilat tm's with mild erythema.  Max sinus areas mild tender.  Pharynx with mild erythema, no exudate Eyes: Conjunctivae and EOM are normal. Pupils are equal, round, and reactive to light.  Neck: Normal range of motion. Neck supple.  Cardiovascular: Normal rate and regular rhythm.   Pulmonary/Chest: Effort normal and breath sounds normal.  Neurological: Pt is alert. Not confused  Skin: Skin is warm. No erythema.  Psychiatric: Pt behavior is normal. Thought content normal.     Assessment & Plan:

## 2012-05-09 ENCOUNTER — Other Ambulatory Visit: Payer: Self-pay | Admitting: Internal Medicine

## 2012-07-07 ENCOUNTER — Other Ambulatory Visit: Payer: Self-pay | Admitting: Internal Medicine

## 2012-07-21 ENCOUNTER — Other Ambulatory Visit: Payer: Self-pay | Admitting: Internal Medicine

## 2012-07-21 NOTE — Telephone Encounter (Signed)
Done hardcopy to [facility]  

## 2012-07-21 NOTE — Telephone Encounter (Signed)
Faxed hardcopy to pharmacy. 

## 2012-08-06 ENCOUNTER — Other Ambulatory Visit: Payer: Self-pay | Admitting: Internal Medicine

## 2012-08-18 ENCOUNTER — Other Ambulatory Visit: Payer: Self-pay | Admitting: Internal Medicine

## 2012-08-19 NOTE — Telephone Encounter (Signed)
Done hardcopy to [facility]  

## 2012-08-19 NOTE — Telephone Encounter (Signed)
Faxed hardcopy to [facility] [employer]

## 2012-10-07 ENCOUNTER — Ambulatory Visit (INDEPENDENT_AMBULATORY_CARE_PROVIDER_SITE_OTHER): Payer: Medicare Other | Admitting: Internal Medicine

## 2012-10-07 ENCOUNTER — Encounter: Payer: Self-pay | Admitting: Internal Medicine

## 2012-10-07 VITALS — BP 140/82 | HR 70 | Temp 98.2°F | Ht 72.0 in | Wt 210.1 lb

## 2012-10-07 DIAGNOSIS — I10 Essential (primary) hypertension: Secondary | ICD-10-CM

## 2012-10-07 DIAGNOSIS — R062 Wheezing: Secondary | ICD-10-CM

## 2012-10-07 DIAGNOSIS — F329 Major depressive disorder, single episode, unspecified: Secondary | ICD-10-CM

## 2012-10-07 MED ORDER — TADALAFIL 20 MG PO TABS: 20.0000 mg | ORAL_TABLET | Freq: Every day | ORAL | Status: DC | PRN

## 2012-10-07 NOTE — Assessment & Plan Note (Signed)
I suspect mild intermittent asthma, ok to cont inhlaer prn

## 2012-10-07 NOTE — Progress Notes (Signed)
Subjective:    Patient ID: Gerald Gomez, male    DOB: Sep 08, 1940, 72 y.o.   MRN: [id number]  HPI  Here to f/u; overall doing ok,  Pt denies chest pain, increased sob or doe, wheezing, orthopnea, PND, increased LE swelling, palpitations, dizziness or syncope.  Pt denies polydipsia, polyuria, or low sugar symptoms such as weakness or confusion improved with po intake.  Pt denies new neurological symptoms such as new headache, or facial or extremity weakness or numbness.   Pt states overall good compliance with meds, has been trying to follow lower cholesterol, diabetic diet, with wt overall stable,  but little exercise however. Did have some left sided CP sharp after wrestling with the lawnmowe a few days ago.  Now off the lisionpril HCT per renal. Overall pain ok, using the trazodone less recently for sleep.  Did have episode with mowing the grass with mild sob/wheezing, better with inhaler. Denies worsening depressive symptoms, suicidal ideation, or panic; has ongoing anxiety, not increased recently.  Past Medical History  Diagnosis Date  . ALLERGIC RHINITIS 03/28/2009  . ANXIETY 03/28/2009  . CHOLELITHIASIS 03/28/2009  . DEGENERATIVE JOINT DISEASE 03/28/2009  . DEPRESSION 03/28/2009  . 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 DIVERTICULOSIS, COLON 03/28/2009  . 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 FATIGUE 03/28/2009  . HEPATITIS C 03/28/2009  . HYPERLIPIDEMIA 03/28/2009  . HYPERTENSION 03/28/2009  . LOW BACK PAIN, CHRONIC 03/28/2009  . PERIPHERAL VASCULAR DISEASE 03/28/2009  . Personal history of alcoholism 03/28/2009  . RENAL INSUFFICIENCY 03/28/2009  . THROMBOCYTOPENIA 03/28/2009  . Wheezing 02/07/2010  . Gallstones    Past Surgical History  Procedure Laterality Date  . Right shoulder surgery  2003    reports that he has been smoking Cigarettes.  He has been smoking about 0.25 packs per day. He does not have any smokeless tobacco history on file. He reports that he does not drink alcohol or use illicit  drugs. family history includes Breast cancer in his daughter; Lung cancer in his father; and Stroke in his brother. Allergies  Allergen Reactions  . Tramadol Nausea Only   Current Outpatient Prescriptions on File Prior to Visit  Medication Sig Dispense Refill  . albuterol (PROVENTIL HFA;VENTOLIN HFA) 108 (90 BASE) MCG/ACT inhaler Inhale 2 puffs into the lungs every 6 (six) hours as needed for wheezing or shortness of breath.  1 Inhaler  5  . citalopram (CELEXA) 10 MG tablet Take 1 tablet (10 mg total) by mouth daily.  90 tablet  3  . clorazepate (TRANXENE) 7.5 MG tablet take 1 tablet by mouth three times a day if needed  90 tablet  2  . fesoterodine (TOVIAZ) 4 MG TB24 Take 4 mg by mouth daily.      . fexofenadine (ALLEGRA) 180 MG tablet Take 180 mg by mouth daily as needed.        . fluticasone (FLONASE) 50 MCG/ACT nasal spray Place 2 sprays into the nose daily.  16 g  4  . HYDROcodone-acetaminophen (NORCO/VICODIN) 5-325 MG per tablet TAKE 1 TABLET BY MOUTH EVERY 6 HOURS AS NEEDED FOR PAIN  60 tablet  3  . ibuprofen (ADVIL,MOTRIN) 800 MG tablet take 1 tablet by mouth twice a day if needed for pain  60 tablet  6  . metoprolol (LOPRESSOR) 100 MG tablet Take 0.5 tablets (50 mg total) by mouth 2 (two) times daily.  90 tablet  3  . traZODone (DESYREL) 50 MG tablet Take 1 tablet (50 mg total) by mouth at  bedtime.  90 tablet  1  . tadalafil (CIALIS) 20 MG tablet Take 1 tablet (20 mg total) by mouth daily as needed for erectile dysfunction.  10 tablet  11   No current facility-administered medications on file prior to visit.   Review of Systems  Constitutional: Negative for unexpected weight change, or unusual diaphoresis  HENT: Negative for tinnitus.   Eyes: Negative for photophobia and visual disturbance.  Respiratory: Negative for choking and stridor.   Gastrointestinal: Negative for vomiting and blood in stool.  Genitourinary: Negative for hematuria and decreased urine volume.   Musculoskeletal: Negative for acute joint swelling Skin: Negative for color change and wound.  Neurological: Negative for tremors and numbness other than noted  Psychiatric/Behavioral: Negative for decreased concentration or  hyperactivity.       Objective:   Physical Exam BP 140/82  Pulse 70  Temp(Src) 98.2 F (36.8 C) (Oral)  Ht 6' (1.829 m)  Wt 210 lb 2 oz (95.312 kg)  BMI 28.49 kg/m2  SpO2 96% VS noted,  Constitutional: Pt appears well-developed and well-nourished. Lavella Lemons HENT: Head: NCAT.  Right Ear: External ear normal.  Left Ear: External ear normal.  Eyes: Conjunctivae and EOM are normal. Pupils are equal, round, and reactive to light.  Neck: Normal range of motion. Neck supple.  Cardiovascular: Normal rate and regular rhythm.   Pulmonary/Chest: Effort normal and breath sounds normal.  Abd:  Soft, NT, non-distended, + BS Neurological: Pt is alert. Not confused  Skin: Skin is warm. No erythema.  Psychiatric: Pt behavior is normal. Thought content normal. somewhat depressed affect, not nervous No LE edema     Assessment & Plan:

## 2012-10-07 NOTE — Assessment & Plan Note (Signed)
stable overall by history and exam, recent data reviewed with pt, and pt to continue medical treatment as before,  to f/u any worsening symptoms or concerns BP Readings from Last 3 Encounters:  10/07/12 140/82  04/08/12 140/90  11/23/11 132/70

## 2012-10-07 NOTE — Assessment & Plan Note (Signed)
stable overall by history and exam, recent data reviewed with pt, and pt to continue medical treatment as before,  to f/u any worsening symptoms or concerns Lab Results  Component Value Date   WBC 5.1 04/08/2012   HGB 12.1* 04/08/2012   HCT 37.0* 04/08/2012   PLT 74.0* 04/08/2012   GLUCOSE 107* 04/08/2012   CHOL 111 11/23/2011   TRIG 330.0* 11/23/2011   HDL 29.60* 11/23/2011   LDLDIRECT 35.1 11/23/2011   ALT 76* 04/10/2011   AST 90* 04/10/2011   NA 138 04/08/2012   K 4.4 04/08/2012   CL 105 04/08/2012   CREATININE 2.3* 04/08/2012   BUN 51* 04/08/2012   CO2 26 04/08/2012   TSH 1.37 04/10/2011   PSA 2.55 04/10/2011   INR 1.1 12/24/2008

## 2012-10-07 NOTE — Patient Instructions (Signed)
Please continue all other medications as before, and refills have been done if requested. Please have the pharmacy call with any other refills you may need. Please keep your appointments with your specialists as you have planned No need for further lab work today  Please remember to sign up for My Chart if you have not done so, as this will be important to you in the future with finding out test results, communicating by private email, and scheduling acute appointments online when needed.  Please return in 6 months, or sooner if needed

## 2012-11-17 ENCOUNTER — Other Ambulatory Visit: Payer: Self-pay | Admitting: Internal Medicine

## 2012-12-17 ENCOUNTER — Other Ambulatory Visit: Payer: Self-pay | Admitting: Internal Medicine

## 2012-12-17 NOTE — Telephone Encounter (Signed)
Done hardcopy to [facility]  

## 2012-12-17 NOTE — Telephone Encounter (Signed)
Faxed hardcopy to [facility]. 

## 2013-01-14 ENCOUNTER — Telehealth: Payer: Self-pay | Admitting: Internal Medicine

## 2013-01-14 MED ORDER — HYDROCODONE-ACETAMINOPHEN 5-325 MG PO TABS: ORAL_TABLET | ORAL | Status: DC

## 2013-01-14 NOTE — Telephone Encounter (Signed)
Done hardcopy to [facility]  

## 2013-01-14 NOTE — Telephone Encounter (Signed)
Called the patient informed to pickup hardcopy at the front desk. 

## 2013-01-14 NOTE — Telephone Encounter (Signed)
01/14/2013  Pt needs refill for hydrocodone.  Thanks.

## 2013-02-09 ENCOUNTER — Other Ambulatory Visit: Payer: Self-pay | Admitting: Internal Medicine

## 2013-02-09 NOTE — Telephone Encounter (Signed)
Ok to refill? Last OV 7.15.14 Last filled 4.28.14

## 2013-02-09 NOTE — Telephone Encounter (Signed)
Done hardcopy to [facility]  

## 2013-02-10 NOTE — Telephone Encounter (Signed)
Faxed hardcopy to [facility] [place]

## 2013-02-16 ENCOUNTER — Telehealth: Payer: Self-pay | Admitting: Internal Medicine

## 2013-02-16 MED ORDER — HYDROCODONE-ACETAMINOPHEN 5-325 MG PO TABS: ORAL_TABLET | ORAL | Status: DC

## 2013-02-16 NOTE — Telephone Encounter (Signed)
Pt call request written rx for hydrocodone. Please advise.

## 2013-02-16 NOTE — Telephone Encounter (Signed)
Done hardcopy to Gerald Gomez, also to let pt know:  You are given the letter today explaining the transitional pain medication refill policy due to recent changes in [place] law and [place] [facility] [employer] Regulations  Please be aware that I will no longer be able to offer monthly refills of any Schedule II or higher medication starting Apr 26, 2013

## 2013-02-17 NOTE — Telephone Encounter (Signed)
Called the patient informed hardcopy's are ready for pickup at the front desk.  Also explained enclosed letter regarding refill policy change.

## 2013-03-03 ENCOUNTER — Other Ambulatory Visit: Payer: Self-pay | Admitting: Internal Medicine

## 2013-03-25 ENCOUNTER — Inpatient Hospital Stay (HOSPITAL_COMMUNITY): Payer: Medicare Other

## 2013-03-25 ENCOUNTER — Encounter (HOSPITAL_COMMUNITY): Payer: Self-pay | Admitting: Emergency Medicine

## 2013-03-25 ENCOUNTER — Emergency Department (HOSPITAL_COMMUNITY): Payer: Medicare Other

## 2013-03-25 ENCOUNTER — Inpatient Hospital Stay (HOSPITAL_COMMUNITY)
Admission: EM | Admit: 2013-03-25 | Discharge: 2013-03-26 | DRG: 293 | Disposition: A | Discharge status: Home / Self Care | Payer: Medicare Other | Attending: Internal Medicine | Admitting: Internal Medicine

## 2013-03-25 DIAGNOSIS — I739 Peripheral vascular disease, unspecified: Secondary | ICD-10-CM | POA: Diagnosis present

## 2013-03-25 DIAGNOSIS — D696 Thrombocytopenia, unspecified: Secondary | ICD-10-CM

## 2013-03-25 DIAGNOSIS — G8929 Other chronic pain: Secondary | ICD-10-CM | POA: Diagnosis present

## 2013-03-25 DIAGNOSIS — D649 Anemia, unspecified: Secondary | ICD-10-CM | POA: Diagnosis present

## 2013-03-25 DIAGNOSIS — F3289 Other specified depressive episodes: Secondary | ICD-10-CM | POA: Diagnosis present

## 2013-03-25 DIAGNOSIS — K8020 Calculus of gallbladder without cholecystitis without obstruction: Secondary | ICD-10-CM

## 2013-03-25 DIAGNOSIS — I10 Essential (primary) hypertension: Secondary | ICD-10-CM

## 2013-03-25 DIAGNOSIS — N183 Chronic kidney disease, stage 3 unspecified: Secondary | ICD-10-CM | POA: Diagnosis present

## 2013-03-25 DIAGNOSIS — D631 Anemia in chronic kidney disease: Secondary | ICD-10-CM

## 2013-03-25 DIAGNOSIS — I5032 Chronic diastolic (congestive) heart failure: Secondary | ICD-10-CM | POA: Diagnosis present

## 2013-03-25 DIAGNOSIS — E785 Hyperlipidemia, unspecified: Secondary | ICD-10-CM | POA: Diagnosis present

## 2013-03-25 DIAGNOSIS — R000 Tachycardia, unspecified: Secondary | ICD-10-CM

## 2013-03-25 DIAGNOSIS — R0600 Dyspnea, unspecified: Secondary | ICD-10-CM

## 2013-03-25 DIAGNOSIS — Z79899 Other long term (current) drug therapy: Secondary | ICD-10-CM

## 2013-03-25 DIAGNOSIS — I129 Hypertensive chronic kidney disease with stage 1 through stage 4 chronic kidney disease, or unspecified chronic kidney disease: Secondary | ICD-10-CM | POA: Diagnosis present

## 2013-03-25 DIAGNOSIS — I110 Hypertensive heart disease with heart failure: Secondary | ICD-10-CM | POA: Diagnosis present

## 2013-03-25 DIAGNOSIS — R0609 Other forms of dyspnea: Secondary | ICD-10-CM

## 2013-03-25 DIAGNOSIS — I517 Cardiomegaly: Secondary | ICD-10-CM

## 2013-03-25 DIAGNOSIS — B1920 Unspecified viral hepatitis C without hepatic coma: Secondary | ICD-10-CM | POA: Diagnosis present

## 2013-03-25 DIAGNOSIS — F411 Generalized anxiety disorder: Secondary | ICD-10-CM | POA: Diagnosis present

## 2013-03-25 DIAGNOSIS — F329 Major depressive disorder, single episode, unspecified: Secondary | ICD-10-CM

## 2013-03-25 DIAGNOSIS — M5137 Other intervertebral disc degeneration, lumbosacral region: Secondary | ICD-10-CM

## 2013-03-25 DIAGNOSIS — M545 Low back pain, unspecified: Secondary | ICD-10-CM | POA: Diagnosis present

## 2013-03-25 DIAGNOSIS — N289 Disorder of kidney and ureter, unspecified: Secondary | ICD-10-CM

## 2013-03-25 DIAGNOSIS — B1710 Acute hepatitis C without hepatic coma: Secondary | ICD-10-CM

## 2013-03-25 DIAGNOSIS — F17200 Nicotine dependence, unspecified, uncomplicated: Secondary | ICD-10-CM | POA: Diagnosis present

## 2013-03-25 DIAGNOSIS — I509 Heart failure, unspecified: Principal | ICD-10-CM | POA: Diagnosis present

## 2013-03-25 HISTORY — DX: Heart failure, unspecified: I50.9

## 2013-03-25 LAB — BASIC METABOLIC PANEL
BUN: 33 mg/dL — ABNORMAL HIGH (ref 6–23)
CO2: 20 mEq/L (ref 19–32)
Calcium: 8.6 mg/dL (ref 8.4–10.5)
Creatinine, Ser: 1.78 mg/dL — ABNORMAL HIGH (ref 0.50–1.35)
GFR calc Af Amer: 42 mL/min — ABNORMAL LOW (ref >90)
GFR calc non Af Amer: 36 mL/min — ABNORMAL LOW (ref >90)
Glucose, Bld: 138 mg/dL — ABNORMAL HIGH (ref 70–99)
Potassium: 4 mEq/L (ref 3.7–5.3)

## 2013-03-25 LAB — CBC
MCH: 28.5 pg (ref 26.0–34.0)
MCHC: 32 g/dL (ref 30.0–36.0)
Platelets: 64 10*3/uL — ABNORMAL LOW (ref 150–400)
RDW: 13.9 % (ref 11.5–15.5)

## 2013-03-25 LAB — TROPONIN I: Troponin I: <0.3 ng/mL (ref <0.30)

## 2013-03-25 LAB — TSH: TSH: 0.691 u[IU]/mL (ref 0.350–4.500)

## 2013-03-25 LAB — HEPATIC FUNCTION PANEL
ALT: 35 U/L (ref 0–53)
Alkaline Phosphatase: 72 U/L (ref 39–117)
Bilirubin, Direct: <0.2 mg/dL (ref 0.0–0.3)

## 2013-03-25 LAB — PRO B NATRIURETIC PEPTIDE: Pro B Natriuretic peptide (BNP): 7935 pg/mL — ABNORMAL HIGH (ref 0–125)

## 2013-03-25 MED ORDER — FUROSEMIDE 10 MG/ML IJ SOLN
40.0000 mg | Freq: Two times a day (BID) | INTRAMUSCULAR | Status: DC
  Administered 2013-03-25 – 2013-03-26 (×3): 40 mg via INTRAVENOUS
  Filled 2013-03-25 (×4): qty 4

## 2013-03-25 MED ORDER — HYDROCODONE-ACETAMINOPHEN 5-325 MG PO TABS
1.0000 | ORAL_TABLET | Freq: Four times a day (QID) | ORAL | Status: DC | PRN
  Administered 2013-03-26 (×2): 1 via ORAL
  Filled 2013-03-25: qty 1

## 2013-03-25 MED ORDER — TAMSULOSIN HCL 0.4 MG PO CAPS
0.4000 mg | ORAL_CAPSULE | Freq: Every day | ORAL | Status: DC
  Administered 2013-03-25 – 2013-03-26 (×2): 0.4 mg via ORAL
  Filled 2013-03-25 (×2): qty 1

## 2013-03-25 MED ORDER — FUROSEMIDE 10 MG/ML IJ SOLN
40.0000 mg | Freq: Once | INTRAMUSCULAR | Status: AC
  Administered 2013-03-25: 40 mg via INTRAVENOUS
  Filled 2013-03-25: qty 4

## 2013-03-25 MED ORDER — FESOTERODINE FUMARATE ER 4 MG PO TB24
4.0000 mg | ORAL_TABLET | Freq: Every day | ORAL | Status: DC
  Administered 2013-03-25 – 2013-03-26 (×2): 4 mg via ORAL
  Filled 2013-03-25 (×2): qty 1

## 2013-03-25 MED ORDER — ONDANSETRON HCL 4 MG PO TABS: 4.0000 mg | ORAL_TABLET | Freq: Four times a day (QID) | ORAL | Status: DC | PRN

## 2013-03-25 MED ORDER — ONDANSETRON HCL 4 MG/2ML IJ SOLN: 4.0000 mg | Freq: Four times a day (QID) | INTRAMUSCULAR | Status: DC | PRN

## 2013-03-25 MED ORDER — METOPROLOL TARTRATE 50 MG PO TABS
50.0000 mg | ORAL_TABLET | Freq: Two times a day (BID) | ORAL | Status: DC
  Administered 2013-03-25 – 2013-03-26 (×3): 50 mg via ORAL
  Filled 2013-03-25 (×4): qty 1

## 2013-03-25 MED ORDER — FLUTICASONE PROPIONATE 50 MCG/ACT NA SUSP
2.0000 | Freq: Every day | NASAL | Status: DC
Start: 2013-03-25 — End: 2013-03-26
  Administered 2013-03-25: 2 via NASAL
  Filled 2013-03-25: qty 16

## 2013-03-25 MED ORDER — SODIUM CHLORIDE 0.9 % IJ SOLN
3.0000 mL | Freq: Two times a day (BID) | INTRAMUSCULAR | Status: DC
  Administered 2013-03-25 (×2): 3 mL via INTRAVENOUS

## 2013-03-25 MED ORDER — ALBUTEROL SULFATE HFA 108 (90 BASE) MCG/ACT IN AERS
2.0000 | INHALATION_SPRAY | RESPIRATORY_TRACT | Status: DC | PRN
  Filled 2013-03-25: qty 6.7

## 2013-03-25 MED ORDER — CLORAZEPATE DIPOTASSIUM 3.75 MG PO TABS: 7.5000 mg | ORAL_TABLET | Freq: Two times a day (BID) | ORAL | Status: DC | PRN

## 2013-03-25 MED ORDER — ALUM & MAG HYDROXIDE-SIMETH 200-200-20 MG/5ML PO SUSP: 30.0000 mL | Freq: Four times a day (QID) | ORAL | Status: DC | PRN

## 2013-03-25 MED ORDER — CYCLOSPORINE 0.05 % OP EMUL
1.0000 [drp] | Freq: Two times a day (BID) | OPHTHALMIC | Status: DC
  Filled 2013-03-25 (×4): qty 1

## 2013-03-25 MED ORDER — SENNOSIDES-DOCUSATE SODIUM 8.6-50 MG PO TABS
1.0000 | ORAL_TABLET | Freq: Every evening | ORAL | Status: DC | PRN
  Filled 2013-03-25: qty 1

## 2013-03-25 MED ORDER — POTASSIUM CHLORIDE CRYS ER 10 MEQ PO TBCR
10.0000 meq | EXTENDED_RELEASE_TABLET | Freq: Two times a day (BID) | ORAL | Status: DC
  Administered 2013-03-25 – 2013-03-26 (×3): 10 meq via ORAL
  Filled 2013-03-25 (×4): qty 1

## 2013-03-25 MED ORDER — CITALOPRAM HYDROBROMIDE 10 MG PO TABS
10.0000 mg | ORAL_TABLET | Freq: Every day | ORAL | Status: DC
  Administered 2013-03-25 – 2013-03-26 (×2): 10 mg via ORAL
  Filled 2013-03-25 (×2): qty 1

## 2013-03-25 NOTE — Progress Notes (Signed)
  Echocardiogram 2D Echocardiogram has been performed.  Cathie Beams 03/25/2013, 3:49 PM

## 2013-03-25 NOTE — Progress Notes (Signed)
Utilization Review Completed.Gerald Gomez T12/31/2014  

## 2013-03-25 NOTE — ED Provider Notes (Signed)
CSN: [id number]     Arrival date & time 03/25/13  4098 History   First MD Initiated Contact with Patient 03/25/13 0407     Chief Complaint  Patient presents with  . Shortness of Breath   (Consider location/radiation/quality/duration/timing/severity/associated sxs/prior Treatment) Patient is a 72 y.o. male presenting with shortness of breath. The history is provided by the patient.  Shortness of Breath He has noted some dyspnea over the last 2 weeks which has been getting progressively worse. He states the dyspnea is worse with exertion but he also tells me that he is not able to walk because of chronic back pain. His wife has noted that he is more dyspneic if he tries to lay flat. He denies chest pain, heaviness, tightness, pressure. He denies any nausea or vomiting. He did have black stools 3 days ago but has had subsequent stools which were normal color. He had taken some Pepto-Bismol. He denies cough, fever, chills, sweats.  Past Medical History  Diagnosis Date  . ALLERGIC RHINITIS 03/28/2009  . ANXIETY 03/28/2009  . CHOLELITHIASIS 03/28/2009  . DEGENERATIVE JOINT DISEASE 03/28/2009  . DEPRESSION 03/28/2009  . 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 DIVERTICULOSIS, COLON 03/28/2009  . 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 FATIGUE 03/28/2009  . HEPATITIS C 03/28/2009  . HYPERLIPIDEMIA 03/28/2009  . HYPERTENSION 03/28/2009  . LOW BACK PAIN, CHRONIC 03/28/2009  . PERIPHERAL VASCULAR DISEASE 03/28/2009  . Personal history of alcoholism 03/28/2009  . RENAL INSUFFICIENCY 03/28/2009  . THROMBOCYTOPENIA 03/28/2009  . Wheezing 02/07/2010  . Gallstones    Past Surgical History  Procedure Laterality Date  . Right shoulder surgery  2003   Family History  Problem Relation Age of Onset  . Lung cancer Father   . Stroke Brother   . Breast cancer Daughter    History  Substance Use Topics  . Smoking status: Current Every Day Smoker -- 0.25 packs/day    Types: Cigarettes  . Smokeless tobacco: Not on file  . Alcohol  Use: No    Review of Systems  Respiratory: Positive for shortness of breath.   All other systems reviewed and are negative.    Allergies  Tramadol  Home Medications   Current Outpatient Rx  Name  Route  Sig  Dispense  Refill  . albuterol (PROVENTIL HFA;VENTOLIN HFA) 108 (90 BASE) MCG/ACT inhaler   Inhalation   Inhale 2 puffs into the lungs every 6 (six) hours as needed for wheezing or shortness of breath.   1 Inhaler   5   . citalopram (CELEXA) 10 MG tablet   Oral   Take 1 tablet (10 mg total) by mouth daily.   90 tablet   3   . clorazepate (TRANXENE) 7.5 MG tablet      TAKE 1 TABLET BY MOUTH 3 TIMES DAILY AS NEEDED   90 tablet   2   . cycloSPORINE (RESTASIS) 0.05 % ophthalmic emulsion   Both Eyes   Place 1 drop into both eyes 2 (two) times daily.         Marland Kitchen docusate sodium (COLACE) 100 MG capsule   Oral   Take 100 mg by mouth daily as needed for mild constipation.         . fesoterodine (TOVIAZ) 4 MG TB24   Oral   Take 4 mg by mouth daily.         . fluticasone (FLONASE) 50 MCG/ACT nasal spray   Nasal   Place 2 sprays into the nose daily.  16 g   4   . HYDROcodone-acetaminophen (NORCO/VICODIN) 5-325 MG per tablet      take 1 tablet by mouth every 6-12 hours if needed for pain, limit #60/month - to fill Apr 17, 2013   60 tablet   0   . ibuprofen (ADVIL,MOTRIN) 800 MG tablet      take 1 tablet by mouth twice a day if needed for pain   60 tablet   6   . metoprolol (LOPRESSOR) 100 MG tablet   Oral   Take 0.5 tablets (50 mg total) by mouth 2 (two) times daily.   90 tablet   3   . tamsulosin (FLOMAX) 0.4 MG CAPS capsule      take 1 capsule by mouth once daily   90 capsule   3   . EXPIRED: tadalafil (CIALIS) 20 MG tablet   Oral   Take 1 tablet (20 mg total) by mouth daily as needed for erectile dysfunction.   2 tablet   11    BP 187/97  Pulse 96  Temp(Src) 98.7 F (37.1 C) (Oral)  Resp 20  SpO2 97% Physical Exam  Nursing note  and vitals reviewed.  72 year old male, resting comfortably and in no acute distress. Vital signs are significant for hypertension with blood pressure 187/97. Oxygen saturation is 97%, which is normal. Head is normocephalic and atraumatic. PERRLA, EOMI. Oropharynx is clear. Neck is nontender and supple without adenopath. JVD is present at 45. Back is nontender and there is no CVA tenderness. Lungs have rales at the right base. Chest is nontender. Heart has regular rate and rhythm without murmur. Abdomen is soft, flat, nontender without masses or hepatosplenomegaly and peristalsis is normoactive. Extremities have no cyanosis or pretibial or pedal edema, full range of motion is present. There is 1+ presacral edema. Skin is warm and dry without rash. Neurologic: Mental status is normal, cranial nerves are intact, there are no motor or sensory deficits.   ED Course  Procedures (including critical care time) Labs Review Results for orders placed during the hospital encounter of 03/25/13  BASIC METABOLIC PANEL      Result Value Range   Sodium 144  137 - 147 mEq/L   Potassium 4.0  3.7 - 5.3 mEq/L   Chloride 111  96 - 112 mEq/L   CO2 20  19 - 32 mEq/L   Glucose, Bld 138 (*) 70 - 99 mg/dL   BUN 33 (*) 6 - 23 mg/dL   Creatinine, Ser 1.61 (*) 0.50 - 1.35 mg/dL   Calcium 8.6  8.4 - 09.6 mg/dL   GFR calc non Af Amer 36 (*) >90 mL/min   GFR calc Af Amer 42 (*) >90 mL/min  CBC      Result Value Range   WBC 7.8  4.0 - 10.5 K/uL   RBC 3.79 (*) 4.22 - 5.81 MIL/uL   Hemoglobin 10.8 (*) 13.0 - 17.0 g/dL   HCT 04.5 (*) 40.9 - 81.1 %   MCV 89.2  78.0 - 100.0 fL   MCH 28.5  26.0 - 34.0 pg   MCHC 32.0  30.0 - 36.0 g/dL   RDW 91.4  78.2 - 95.6 %   Platelets 64 (*) 150 - 400 K/uL  PRO B NATRIURETIC PEPTIDE      Result Value Range   Pro B Natriuretic peptide (BNP) 7935.0 (*) 0 - 125 pg/mL  TROPONIN I      Result Value Range   Troponin I <0.30  <  0.30 ng/mL  OCCULT BLOOD, POC DEVICE      Result  Value Range   Fecal Occult Bld NEGATIVE  NEGATIVE   Imaging Review Dg Chest 2 View (if Patient Has Fever And/or Copd)  03/25/2013   CLINICAL DATA:  Shortness of breath and cough.  EXAM: CHEST  2 VIEW  COMPARISON:  02/07/2010  FINDINGS: Shallow inspiration with elevation of left hemidiaphragm. Heart size and pulmonary vascularity are normal. Lungs appear clear without focal infiltration or consolidation. No blunting of costophrenic angles. No significant change.  IMPRESSION: No evidence of active pulmonary disease.   Electronically Signed   By: Burman Nieves M.D.   On: 03/25/2013 04:22   Images viewed by me.  EKG Interpretation    Date/Time:  Wednesday March 25 2013 03:21:13 EST Ventricular Rate:  91 PR Interval:  183 QRS Duration: 76 QT Interval:  374 QTC Calculation: 460 R Axis:   75 Text Interpretation:  Sinus tachycardia Multiform ventricular premature complexes Biatrial enlargement LVH with secondary repolarization abnormality When compared with ECG of 04/29/2002,  T wave inversion is now Present in the Anterolateral leads - probably secondary to LVH Confirmed by [facility]  MD, Tabbetha Kutscher ([id number]) on 03/25/2013 3:58:11 AM            MDM   1. CHF (congestive heart failure)   2. Tachyarrhythmia   3. Anemia   4. Renal insufficiency   5. Thrombocytopenia    Progressive dyspnea for some for possible congestive heart failure. He has our renal insufficiency which is actually improved over baseline. There has been a drop in hemoglobin over the last year he and stool has been sent for Hemoccult testing. He had a stool sample so rectal exam was not necessary did send stool for Hemoccult. BNP and troponin will be checked.  BNP has come back significantly elevated. Hemoccult was negative and troponin is normal. He is given a dose of furosemide and while being observed, it had transient episodes of tachyarrhythmia with heart rate going up to 140-150. It was elected to admit the patient at this  point. Case has been discussed with Dr. Lendell Caprice of [facility] who agrees to admit the patient.  Dione Booze, MD 03/25/13 [phone]

## 2013-03-25 NOTE — ED Notes (Addendum)
Pt comes from home VIA ems.  Upon arrival at was using assecry muscle to breath.  Pt states SOB started around on hour prior.  Given two Duoneb breathing treatments in route and 125 solu-medol IV by EMS.  Pt states he has been "sick all week."  Pt states that he was having BM's that were "black as cole" and "smelled awful."  Pt states that he is having normal BM's currently

## 2013-03-25 NOTE — H&P (Addendum)
[facility] History and Physical  EWARD RUTIGLIANO HYQ:[id number] DOB: [birth year] DOA: 03/25/2013  Referring physician: Preston Fleeting PCP: Oliver Barre, MD   Chief Complaint: shortness of breath  HPI: Gerald Gomez is a 72 y.o. male presents to ED with worsening dyspnea. No cough, no CP, palpitations, F/C.  No edema, but has been gaining weight, and pants don't fit.  CXR ok, but proBNP 7900.  No h/o CHF.  Given lasix in ED.  H/o hep C and previous heavy alcohol use.  Also c/o black stool, but was taking pepto bismol. Stool heme negative  Review of Systems:  Systems reviewed. As above, otherwise negative.  Past Medical History  Diagnosis Date  . ALLERGIC RHINITIS 03/28/2009  . ANXIETY 03/28/2009  . CHOLELITHIASIS 03/28/2009  . DEGENERATIVE JOINT DISEASE 03/28/2009  . DEPRESSION 03/28/2009  . 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 DIVERTICULOSIS, COLON 03/28/2009  . 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 FATIGUE 03/28/2009  . HEPATITIS C 03/28/2009  . HYPERLIPIDEMIA 03/28/2009  . HYPERTENSION 03/28/2009  . LOW BACK PAIN, CHRONIC 03/28/2009  . PERIPHERAL VASCULAR DISEASE 03/28/2009  . Personal history of alcoholism 03/28/2009  . RENAL INSUFFICIENCY 03/28/2009  . THROMBOCYTOPENIA 03/28/2009  . Wheezing 02/07/2010  . Gallstones   . CHF (congestive heart failure)    Past Surgical History  Procedure Laterality Date  . Right shoulder surgery  2003   Social History:  reports that he has been smoking Cigarettes.  He has been smoking about 0.25 packs per day. He does not have any smokeless tobacco history on file. He reports that he does not drink alcohol or use illicit drugs.  Allergies  Allergen Reactions  . Tramadol Nausea Only    Family History  Problem Relation Age of Onset  . Lung cancer Father   . Stroke Brother   . Breast cancer Daughter      Prior to Admission medications   Medication Sig Start Date End Date Taking? Authorizing Provider  albuterol (PROVENTIL HFA;VENTOLIN HFA) 108 (90 BASE) MCG/ACT  inhaler Inhale 2 puffs into the lungs every 6 (six) hours as needed for wheezing or shortness of breath. 04/08/12  Yes Corwin Levins, MD  citalopram (CELEXA) 10 MG tablet Take 1 tablet (10 mg total) by mouth daily. 04/08/12 04/08/13 Yes Corwin Levins, MD  clorazepate (TRANXENE) 7.5 MG tablet TAKE 1 TABLET BY MOUTH 3 TIMES DAILY AS NEEDED 02/09/13  Yes Corwin Levins, MD  cycloSPORINE (RESTASIS) 0.05 % ophthalmic emulsion Place 1 drop into both eyes 2 (two) times daily.   Yes Historical Provider, MD  docusate sodium (COLACE) 100 MG capsule Take 100 mg by mouth daily as needed for mild constipation.   Yes Historical Provider, MD  fesoterodine (TOVIAZ) 4 MG TB24 Take 4 mg by mouth daily.   Yes Historical Provider, MD  fluticasone (FLONASE) 50 MCG/ACT nasal spray Place 2 sprays into the nose daily. 04/08/12  Yes Corwin Levins, MD  HYDROcodone-acetaminophen (NORCO/VICODIN) 5-325 MG per tablet take 1 tablet by mouth every 6-12 hours if needed for pain, limit #60/month - to fill Apr 17, 2013 02/16/13  Yes Corwin Levins, MD  ibuprofen (ADVIL,MOTRIN) 800 MG tablet take 1 tablet by mouth twice a day if needed for pain 03/03/13  Yes Corwin Levins, MD  metoprolol (LOPRESSOR) 100 MG tablet Take 0.5 tablets (50 mg total) by mouth 2 (two) times daily. 04/08/12  Yes Corwin Levins, MD  tamsulosin (FLOMAX) 0.4 MG CAPS capsule take 1 capsule by mouth once  daily 11/17/12  Yes Corwin Levins, MD  tadalafil (CIALIS) 20 MG tablet Take 1 tablet (20 mg total) by mouth daily as needed for erectile dysfunction. 10/07/12 11/06/12  Corwin Levins, MD   Physical Exam: Filed Vitals:   03/25/13 0921  BP: 196/96  Pulse: 91  Temp: 97.4 F (36.3 C)  Resp: 18    BP 196/96  Pulse 91  Temp(Src) 97.4 F (36.3 C) (Oral)  Resp 18  SpO2 92% BP 196/96  Pulse 91  Temp(Src) 97.4 F (36.3 C) (Oral)  Resp 18  SpO2 92%  General Appearance:    Alert, cooperative, no distress, appears stated age.  Head:    Normocephalic, without obvious abnormality,  atraumatic  Eyes:    PERRL, conjunctiva/corneas clear, EOM's intact, fundi    benign, both eyes          Nose:   Nares normal, septum midline, mucosa normal, no drainage   or sinus tenderness  Throat:   Lips, mucosa, and tongue normal; teeth and gums normal  Neck:   Supple, symmetrical, trachea midline, no adenopathy;       thyroid:  No enlargement/tenderness/nodules; no carotid   bruit or JVD  Back:     Symmetric, no curvature, ROM normal, no CVA tenderness  Lungs:     Clear to auscultation bilaterally, respirations unlabored  Chest wall:    No tenderness or deformity  Heart:    Regular rate and rhythm, S1 and S2 normal, no murmur, rub   or gallop  Abdomen:     Soft, non-tender, bowel sounds active all four quadrants,    no masses, no organomegaly  Genitalia:    deferred  Rectal:    deferred  Extremities:   Extremities normal, atraumatic, no cyanosis or edema  Pulses:   2+ and symmetric all extremities  Skin:   Skin color, texture, turgor normal, no rashes or lesions  Lymph nodes:   Cervical, supraclavicular, and axillary nodes normal  Neurologic:   CNII-XII intact. Normal strength, sensation and reflexes      Throughout. Gait slow and hunched over    Psych: normal affect        Labs on Admission:  Basic Metabolic Panel:  Recent Labs Lab 03/25/13 0324  NA 144  K 4.0  CL 111  CO2 20  GLUCOSE 138*  BUN 33*  CREATININE 1.78*  CALCIUM 8.6   Liver Function Tests: No results found for this basename: AST, ALT, ALKPHOS, BILITOT, PROT, ALBUMIN,  in the last 168 hours No results found for this basename: LIPASE, AMYLASE,  in the last 168 hours No results found for this basename: AMMONIA,  in the last 168 hours CBC:  Recent Labs Lab 03/25/13 0324  WBC 7.8  HGB 10.8*  HCT 33.8*  MCV 89.2  PLT 64*   Cardiac Enzymes:  Recent Labs Lab 03/25/13 0434  TROPONINI <0.30    BNP (last 3 results)  Recent Labs  03/25/13 0434  PROBNP 7935.0*   CBG: No results found  for this basename: GLUCAP,  in the last 168 hours  Radiological Exams on Admission: Dg Chest 2 View (if Patient Has Fever And/or Copd)  03/25/2013   CLINICAL DATA:  Shortness of breath and cough.  EXAM: CHEST  2 VIEW  COMPARISON:  02/07/2010  FINDINGS: Shallow inspiration with elevation of left hemidiaphragm. Heart size and pulmonary vascularity are normal. Lungs appear clear without focal infiltration or consolidation. No blunting of costophrenic angles. No significant change.  IMPRESSION: No  evidence of active pulmonary disease.   Electronically Signed   By: Burman Nieves M.D.   On: 03/25/2013 04:22    EKG: NSR with PVC, LVH with strain, biatrial enlargement  Assessment/Plan Principal Problem:  dyspnea: likely CHF (congestive heart failure) based on pBNP. Lasix. Low salt diet echo, tele Active Problems:   HEPATITIS C: no recent US. Will order as thrombocytopenia worsening. May be cirrhosis. Last [place] 2010 showed fatty liver   THROMBOCYTOPENIA, chronic, but worse. See above.    ANXIETY   DEPRESSION   HYPERTENSION  Code Status: full Family Communication: *none available Disposition Plan: home  Time spent: 60 min  Augusten Lipkin L [facility] Pager [phone]

## 2013-03-26 DIAGNOSIS — B1710 Acute hepatitis C without hepatic coma: Secondary | ICD-10-CM

## 2013-03-26 DIAGNOSIS — M5137 Other intervertebral disc degeneration, lumbosacral region: Secondary | ICD-10-CM

## 2013-03-26 DIAGNOSIS — K8020 Calculus of gallbladder without cholecystitis without obstruction: Secondary | ICD-10-CM

## 2013-03-26 LAB — CBC
HCT: 34.8 % — ABNORMAL LOW (ref 39.0–52.0)
Hemoglobin: 11.4 g/dL — ABNORMAL LOW (ref 13.0–17.0)
MCH: 28.4 pg (ref 26.0–34.0)
MCHC: 32.8 g/dL (ref 30.0–36.0)
MCV: 86.6 fL (ref 78.0–100.0)
Platelets: 76 10*3/uL — ABNORMAL LOW (ref 150–400)
RBC: 4.02 MIL/uL — ABNORMAL LOW (ref 4.22–5.81)
RDW: 13.8 % (ref 11.5–15.5)
WBC: 8.7 10*3/uL (ref 4.0–10.5)

## 2013-03-26 LAB — PROTIME-INR
INR: 1.11 (ref 0.00–1.49)
Prothrombin Time: 14.1 seconds (ref 11.6–15.2)

## 2013-03-26 LAB — APTT: aPTT: 33 seconds (ref 24–37)

## 2013-03-26 MED ORDER — FUROSEMIDE 40 MG PO TABS: 40.0000 mg | ORAL_TABLET | Freq: Every day | ORAL | Status: DC

## 2013-03-26 MED ORDER — LISINOPRIL 10 MG PO TABS: 10.0000 mg | ORAL_TABLET | Freq: Every day | ORAL | Status: DC

## 2013-03-26 MED ORDER — POTASSIUM CHLORIDE CRYS ER 10 MEQ PO TBCR: 10.0000 meq | EXTENDED_RELEASE_TABLET | Freq: Two times a day (BID) | ORAL | Status: DC

## 2013-03-26 NOTE — Progress Notes (Signed)
Discharge instructions reviewed with patient and wife.  Instructions reviewed re:  Diet, medications, weight, and when to call the MD also reviewed.  Both patient and wife appear to have a good understanding of instructions AEB use of "Teach-Back" technique.  Labwork reviewed with both patient and wife and explained.  Exercise regimen reviewed as well as importance of use of "My Chart" system.  Patient has declined due to "computer failure".  Patient discharged to home with wife.  Escorted to exit via wheelchair by nurse tech.

## 2013-03-26 NOTE — Discharge Summary (Signed)
Physician Discharge Summary  SACRAMENTO MONDS AJG:[id number] DOB: 16-Feb-1941 DOA: 03/25/2013  PCP: [name], MD  Admit date: 03/25/2013 Discharge date: 03/26/2013  Time spent: 35 minutes  Recommendations for Outpatient Follow-up:  Follow up with PCP in 1-2 weeks Repeat basic metabolic panel in 1-2 weeks  Discharge Diagnoses:  Principal Problem:   CHF (congestive heart failure) Active Problems:   HEPATITIS C   THROMBOCYTOPENIA   ANXIETY   DEPRESSION   HYPERTENSION   CKD (chronic kidney disease) stage 3, GFR 30-59 ml/min   DISC DISEASE, LUMBAR   LOW BACK PAIN, CHRONIC   Dyspnea   Anemia   Discharge Condition: Stable  Diet recommendation: Low sodium  Filed Weights   03/26/13 0535  Weight: 92.942 kg (204 lb 14.4 oz)    History of present illness:  Gerald Gomez is a 73 y.o. male presents to ED with worsening dyspnea. No cough, no CP, palpitations, F/C. No edema, but has been gaining weight, and pants don't fit. CXR ok, but proBNP 7900. No h/o CHF. Given lasix in ED. H/o hep C and previous heavy alcohol use.  Hospital Course:  The patient was admitted to the floor. He was continued on bid lasix with net -1700cc. TSH was normal. Initial cardiac biomarker was unremarkable and the pt did not complain of CP. Renal function remained stable. The patient had a 2D echo which revealed grade 2 diastolic dysfunction with a normal EF. Given a hx of hepC, a liver US was done, which revealed evidence of gallstones w/o obstruction. The patient otherwise remained medically stable for close outpatient follow up.  Procedures:  2D echo  Liver US  Discharge Exam: Filed Vitals:   03/25/13 0921 03/25/13 2040 03/26/13 0535 03/26/13 1345  BP: 196/96 159/97 167/92 155/92  Pulse: 91 87 72 69  Temp: 97.4 F (36.3 C) 98.1 F (36.7 C) 98.5 F (36.9 C) 97.8 F (36.6 C)  TempSrc: Oral Oral Oral Oral  Resp: 18 20 18 20   Height:   6' (1.829 m)   Weight:   92.942 kg (204 lb 14.4 oz)   SpO2:  92% 98% 99% 95%    General: Awake, in nad Cardiovascular: regular, s1, s2 Respiratory: normal resp effort, no wheezing  Discharge Instructions       Future Appointments Provider Department Dept Phone   04/21/2013 1:30 PM [name], MD [facility] [phone]       Medication List         albuterol 108 (90 BASE) MCG/ACT inhaler  Commonly known as:  PROVENTIL HFA;VENTOLIN HFA  Inhale 2 puffs into the lungs every 6 (six) hours as needed for wheezing or shortness of breath.     citalopram 10 MG tablet  Commonly known as:  CELEXA  Take 1 tablet (10 mg total) by mouth daily.     clorazepate 7.5 MG tablet  Commonly known as:  TRANXENE  TAKE 1 TABLET BY MOUTH 3 TIMES DAILY AS NEEDED     cycloSPORINE 0.05 % ophthalmic emulsion  Commonly known as:  RESTASIS  Place 1 drop into both eyes 2 (two) times daily.     docusate sodium 100 MG capsule  Commonly known as:  COLACE  Take 100 mg by mouth daily as needed for mild constipation.     fluticasone 50 MCG/ACT nasal spray  Commonly known as:  FLONASE  Place 2 sprays into the nose daily.     furosemide 40 MG tablet  Commonly known as:  LASIX  Take 1 tablet (40 mg total) by mouth daily.     HYDROcodone-acetaminophen 5-325 MG per tablet  Commonly known as:  NORCO/VICODIN  take 1 tablet by mouth every 6-12 hours if needed for pain, limit #60/month - to fill Apr 17, 2013     ibuprofen 800 MG tablet  Commonly known as:  ADVIL,MOTRIN  take 1 tablet by mouth twice a day if needed for pain     lisinopril 10 MG tablet  Commonly known as:  PRINIVIL,ZESTRIL  Take 1 tablet (10 mg total) by mouth daily.     metoprolol 100 MG tablet  Commonly known as:  LOPRESSOR  Take 0.5 tablets (50 mg total) by mouth 2 (two) times daily.     potassium chloride 10 MEQ tablet  Commonly known as:  K-DUR,KLOR-CON  Take 1 tablet (10 mEq total) by mouth 2 (two) times daily.     tadalafil 20 MG tablet  Commonly known as:   CIALIS  Take 1 tablet (20 mg total) by mouth daily as needed for erectile dysfunction.     tamsulosin 0.4 MG Caps capsule  Commonly known as:  FLOMAX  take 1 capsule by mouth once daily     TOVIAZ 4 MG Tb24 tablet  Generic drug:  fesoterodine  Take 4 mg by mouth daily.       Allergies  Allergen Reactions  . Tramadol Nausea Only      The results of significant diagnostics from this hospitalization (including imaging, microbiology, ancillary and laboratory) are listed below for reference.    Significant Diagnostic Studies: Dg Chest 2 View (if Patient Has Fever And/or Copd)  03/25/2013   CLINICAL DATA:  Shortness of breath and cough.  EXAM: CHEST  2 VIEW  COMPARISON:  02/07/2010  FINDINGS: Shallow inspiration with elevation of left hemidiaphragm. Heart size and pulmonary vascularity are normal. Lungs appear clear without focal infiltration or consolidation. No blunting of costophrenic angles. No significant change.  IMPRESSION: No evidence of active pulmonary disease.   Electronically Signed   By: Lucienne Capers M.D.   On: 03/25/2013 04:22   US Abdomen Complete  03/25/2013   CLINICAL DATA:  Hep C, thrombocytopenia  EXAM: ULTRASOUND ABDOMEN COMPLETE  COMPARISON:  12/02/2008  FINDINGS: Gallbladder:  Multiple small layering gallstones. No gallbladder wall thickening or pericholecystic fluid. Negative sonographic Murphy's sign.  Common bile duct:  Diameter: 5 mm.  Liver:  No focal lesion identified. Within normal limits in parenchymal echogenicity.  IVC:  Poorly visualized.  Pancreas:  Poorly visualized due to overlying bowel gas.  Spleen:  Measures 7.9 cm.  Right Kidney:  Length: 10.4 cm. Echogenic renal parenchyma, suggesting medical renal disease. No hydronephrosis.  Left Kidney:  Length: 12.5 cm. Echogenic renal parenchyma, suggesting medical renal disease. Poorly visualized. No hydronephrosis.  Abdominal aorta:  No aneurysm visualized.  Other findings:  None.  IMPRESSION: Cholelithiasis,  without associated sonographic findings to suggest acute cholecystitis.  Echogenic renal parenchyma, suggesting medical renal disease.   Electronically Signed   By: Julian Hy M.D.   On: 03/25/2013 16:28    Microbiology: No results found for this or any previous visit (from the past 240 hour(s)).   Labs: Basic Metabolic Panel:  Recent Labs Lab 03/25/13 0324  NA 144  K 4.0  CL 111  CO2 20  GLUCOSE 138*  BUN 33*  CREATININE 1.78*  CALCIUM 8.6   Liver Function Tests:  Recent Labs Lab 03/25/13 1139  AST 49*  ALT 35  ALKPHOS  72  BILITOT 0.4  PROT 8.1  ALBUMIN 3.1*   No results found for this basename: LIPASE, AMYLASE,  in the last 168 hours No results found for this basename: AMMONIA,  in the last 168 hours CBC:  Recent Labs Lab 03/25/13 0324 03/26/13 0513  WBC 7.8 8.7  HGB 10.8* 11.4*  HCT 33.8* 34.8*  MCV 89.2 86.6  PLT 64* 76*   Cardiac Enzymes:  Recent Labs Lab 03/25/13 0434  TROPONINI <0.30   BNP: BNP (last 3 results)  Recent Labs  03/25/13 0434  PROBNP 7935.0*   CBG: No results found for this basename: GLUCAP,  in the last 168 hours   Signed:  CHIU, STEPHEN K  [facility] 03/26/2013, 4:20 PM

## 2013-04-03 ENCOUNTER — Ambulatory Visit (INDEPENDENT_AMBULATORY_CARE_PROVIDER_SITE_OTHER): Payer: Medicare Other | Admitting: Internal Medicine

## 2013-04-03 ENCOUNTER — Encounter: Payer: Self-pay | Admitting: Internal Medicine

## 2013-04-03 ENCOUNTER — Other Ambulatory Visit (INDEPENDENT_AMBULATORY_CARE_PROVIDER_SITE_OTHER): Payer: Medicare Other

## 2013-04-03 VITALS — BP 160/90 | HR 66 | Temp 98.2°F | Wt 207.0 lb

## 2013-04-03 DIAGNOSIS — Z23 Encounter for immunization: Secondary | ICD-10-CM

## 2013-04-03 DIAGNOSIS — F329 Major depressive disorder, single episode, unspecified: Secondary | ICD-10-CM

## 2013-04-03 DIAGNOSIS — N183 Chronic kidney disease, stage 3 unspecified: Secondary | ICD-10-CM

## 2013-04-03 DIAGNOSIS — I509 Heart failure, unspecified: Secondary | ICD-10-CM

## 2013-04-03 DIAGNOSIS — F3289 Other specified depressive episodes: Secondary | ICD-10-CM

## 2013-04-03 DIAGNOSIS — M545 Low back pain, unspecified: Secondary | ICD-10-CM

## 2013-04-03 LAB — BASIC METABOLIC PANEL
BUN: 71 mg/dL — ABNORMAL HIGH (ref 6–23)
CO2: 19 mEq/L (ref 19–32)
Calcium: 9.5 mg/dL (ref 8.4–10.5)
Chloride: 110 mEq/L (ref 96–112)
Creatinine, Ser: 2.8 mg/dL — ABNORMAL HIGH (ref 0.4–1.5)
GFR: 28.89 mL/min — ABNORMAL LOW (ref >60.00)
Glucose, Bld: 95 mg/dL (ref 70–99)
Potassium: 5.1 mEq/L (ref 3.5–5.1)
Sodium: 137 mEq/L (ref 135–145)

## 2013-04-03 MED ORDER — HYDROCODONE-ACETAMINOPHEN 5-325 MG PO TABS: ORAL_TABLET | ORAL | Status: DC

## 2013-04-03 NOTE — Assessment & Plan Note (Signed)
Wt steady, good med compliance, overall stable overall by history and exam, recent data reviewed with pt, and pt to continue medical treatment as before,  to f/u any worsening symptoms or concerns For bmet today

## 2013-04-03 NOTE — Progress Notes (Signed)
Pre visit review using our clinic review tool, if applicable. No additional management support is needed unless otherwise documented below in the visit note. 

## 2013-04-03 NOTE — Patient Instructions (Signed)
Please continue all other medications as before, and refills have been done if requested. Please have the pharmacy call with any other refills you may need. Please continue your efforts at being more active, low cholesterol diet, and weight control.  Please keep your appointments with your specialists as you have planned - Dr Merrily Brittle will be contacted regarding the referral for: pain management  Please go to the LAB in the Basement (turn left off the elevator) for the tests to be done today You will be contacted by phone if any changes need to be made immediately.  Otherwise, you will receive a letter about your results with an explanation, but please check with MyChart first.  Please remember to sign up for My Chart if you have not done so, as this will be important to you in the future with finding out test results, communicating by private email, and scheduling acute appointments online when needed.

## 2013-04-03 NOTE — Progress Notes (Signed)
Subjective:    Patient ID: Gerald Gomez, male    DOB: [birth year], 73 y.o.   MRN: [id number]  HPI  Here to f/u; overall doing ok,  Pt denies chest pain, increased sob or doe, wheezing, orthopnea, PND, increased LE swelling, palpitations, dizziness or syncope.  Pt denies polydipsia, polyuria, or low sugar symptoms such as weakness or confusion improved with po intake.  Pt denies new neurological symptoms such as new headache, or facial or extremity weakness or numbness.   Pt states overall good compliance with meds, has been trying to follow lower cholesterol diet, with wt overall stable - approx 204 at home/steady in the wk.,  OVerall pain controlled, needs long term pain management.  Has f/u with Dr Louie Boston soon as well. Past Medical History  Diagnosis Date  . ALLERGIC RHINITIS 03/28/2009  . ANXIETY 03/28/2009  . CHOLELITHIASIS 03/28/2009  . DEGENERATIVE JOINT DISEASE 03/28/2009  . DEPRESSION 03/28/2009  . [place] DISEASE, LUMBAR 03/28/2009  . DIVERTICULOSIS, COLON 03/28/2009  . 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 FATIGUE 03/28/2009  . HEPATITIS C 03/28/2009  . HYPERLIPIDEMIA 03/28/2009  . HYPERTENSION 03/28/2009  . LOW BACK PAIN, CHRONIC 03/28/2009  . PERIPHERAL VASCULAR DISEASE 03/28/2009  . Personal history of alcoholism 03/28/2009  . RENAL INSUFFICIENCY 03/28/2009  . THROMBOCYTOPENIA 03/28/2009  . Wheezing 02/07/2010  . Gallstones   . CHF (congestive heart failure)    Past Surgical History  Procedure Laterality Date  . Right shoulder surgery  2003    reports that he has been smoking Cigarettes.  He has been smoking about 0.25 packs per day. He does not have any smokeless tobacco history on file. He reports that he does not drink alcohol or use illicit drugs. family history includes Breast cancer in his daughter; Lung cancer in his father; Stroke in his brother. Allergies  Allergen Reactions  . Tramadol Nausea Only   Current Outpatient Prescriptions on File Prior to Visit  Medication Sig  Dispense Refill  . albuterol (PROVENTIL HFA;VENTOLIN HFA) 108 (90 BASE) MCG/ACT inhaler Inhale 2 puffs into the lungs every 6 (six) hours as needed for wheezing or shortness of breath.  1 Inhaler  5  . citalopram (CELEXA) 10 MG tablet Take 1 tablet (10 mg total) by mouth daily.  90 tablet  3  . clorazepate (TRANXENE) 7.5 MG tablet TAKE 1 TABLET BY MOUTH 3 TIMES DAILY AS NEEDED  90 tablet  2  . cycloSPORINE (RESTASIS) 0.05 % ophthalmic emulsion Place 1 drop into both eyes 2 (two) times daily.      Marland Kitchen docusate sodium (COLACE) 100 MG capsule Take 100 mg by mouth daily as needed for mild constipation.      . fesoterodine (TOVIAZ) 4 MG TB24 Take 4 mg by mouth daily.      . fluticasone (FLONASE) 50 MCG/ACT nasal spray Place 2 sprays into the nose daily.  16 g  4  . furosemide (LASIX) 40 MG tablet Take 1 tablet (40 mg total) by mouth daily.  30 tablet  0  . ibuprofen (ADVIL,MOTRIN) 800 MG tablet take 1 tablet by mouth twice a day if needed for pain  60 tablet  6  . lisinopril (PRINIVIL,ZESTRIL) 10 MG tablet Take 1 tablet (10 mg total) by mouth daily.  30 tablet  0  . metoprolol (LOPRESSOR) 100 MG tablet Take 0.5 tablets (50 mg total) by mouth 2 (two) times daily.  90 tablet  3  . potassium chloride (K-DUR,KLOR-CON) 10 MEQ tablet Take 1 tablet (10  mEq total) by mouth 2 (two) times daily.      . tamsulosin (FLOMAX) 0.4 MG CAPS capsule take 1 capsule by mouth once daily  90 capsule  3  . tadalafil (CIALIS) 20 MG tablet Take 1 tablet (20 mg total) by mouth daily as needed for erectile dysfunction.  2 tablet  11   No current facility-administered medications on file prior to visit.   Review of Systems  Constitutional: Negative for unexpected weight change, or unusual diaphoresis  HENT: Negative for tinnitus.   Eyes: Negative for photophobia and visual disturbance.  Respiratory: Negative for choking and stridor.   Gastrointestinal: Negative for vomiting and blood in stool.  Genitourinary: Negative for  hematuria and decreased urine volume.  Musculoskeletal: Negative for acute joint swelling Skin: Negative for color change and wound.  Neurological: Negative for tremors and numbness other than noted  Psychiatric/Behavioral: Negative for decreased concentration or  hyperactivity.       Objective:   Physical Exam BP 160/90  Pulse 66  Temp(Src) 98.2 F (36.8 C) (Oral)  Wt 207 lb (93.895 kg)  SpO2 97% VS noted, not ill appearing Constitutional: Pt appears well-developed and well-nourished.  HENT: Head: NCAT.  Right Ear: External ear normal.  Left Ear: External ear normal.  Eyes: Conjunctivae and EOM are normal. Pupils are equal, round, and reactive to light.  Neck: Normal range of motion. Neck supple.  Cardiovascular: Normal rate and regular rhythm.   Pulmonary/Chest: Effort normal and breath sounds normal.  Abd:  Soft, NT, non-distended, + BS Neurological: Pt is alert. Not confused  Skin: Skin is warm. No erythema. trace LE edema left with varicosities Psychiatric: Pt behavior is normal. Thought content normal. not depressed affect, mild nervous    Assessment & Plan:

## 2013-04-03 NOTE — Assessment & Plan Note (Signed)
stable overall by history and exam, and pt to continue medical treatment as before,  to f/u any worsening symptoms or concerns, for med refills 

## 2013-04-03 NOTE — Assessment & Plan Note (Signed)
.  stable overall by history and exam, recent data reviewed with pt, and pt to continue medical treatment as before,  to f/u any worsening symptoms or concerns Lab Results  Component Value Date   WBC 8.7 03/26/2013   HGB 11.4* 03/26/2013   HCT 34.8* 03/26/2013   PLT 76* 03/26/2013   GLUCOSE 138* 03/25/2013   CHOL 111 11/23/2011   TRIG 330.0* 11/23/2011   HDL 29.60* 11/23/2011   LDLDIRECT 35.1 11/23/2011   ALT 35 03/25/2013   AST 49* 03/25/2013   NA 144 03/25/2013   K 4.0 03/25/2013   CL 111 03/25/2013   CREATININE 1.78* 03/25/2013   BUN 33* 03/25/2013   CO2 20 03/25/2013   TSH 0.691 03/25/2013   PSA 2.55 04/10/2011   INR 1.11 03/26/2013

## 2013-04-05 NOTE — Assessment & Plan Note (Signed)
For bmet as above, f/u with renal as planned

## 2013-04-14 ENCOUNTER — Other Ambulatory Visit: Payer: Self-pay | Admitting: Internal Medicine

## 2013-04-15 ENCOUNTER — Ambulatory Visit: Payer: Medicare Other | Admitting: Internal Medicine

## 2013-04-18 ENCOUNTER — Inpatient Hospital Stay (HOSPITAL_COMMUNITY)
Admission: EM | Admit: 2013-04-18 | Discharge: 2013-04-21 | DRG: 683 | Disposition: A | Discharge status: Home / Self Care | Payer: Medicare Other | Attending: Internal Medicine | Admitting: Internal Medicine

## 2013-04-18 ENCOUNTER — Emergency Department (HOSPITAL_COMMUNITY): Payer: Medicare Other

## 2013-04-18 ENCOUNTER — Encounter (HOSPITAL_COMMUNITY): Payer: Self-pay | Admitting: Emergency Medicine

## 2013-04-18 DIAGNOSIS — I509 Heart failure, unspecified: Secondary | ICD-10-CM | POA: Diagnosis present

## 2013-04-18 DIAGNOSIS — M1990 Unspecified osteoarthritis, unspecified site: Secondary | ICD-10-CM

## 2013-04-18 DIAGNOSIS — E785 Hyperlipidemia, unspecified: Secondary | ICD-10-CM | POA: Diagnosis present

## 2013-04-18 DIAGNOSIS — M545 Low back pain, unspecified: Secondary | ICD-10-CM | POA: Diagnosis present

## 2013-04-18 DIAGNOSIS — B1920 Unspecified viral hepatitis C without hepatic coma: Secondary | ICD-10-CM | POA: Diagnosis present

## 2013-04-18 DIAGNOSIS — M25562 Pain in left knee: Secondary | ICD-10-CM

## 2013-04-18 DIAGNOSIS — R0600 Dyspnea, unspecified: Secondary | ICD-10-CM

## 2013-04-18 DIAGNOSIS — Z0000 Encounter for general adult medical examination without abnormal findings: Secondary | ICD-10-CM

## 2013-04-18 DIAGNOSIS — E860 Dehydration: Secondary | ICD-10-CM

## 2013-04-18 DIAGNOSIS — F1021 Alcohol dependence, in remission: Secondary | ICD-10-CM

## 2013-04-18 DIAGNOSIS — I110 Hypertensive heart disease with heart failure: Secondary | ICD-10-CM | POA: Diagnosis present

## 2013-04-18 DIAGNOSIS — D696 Thrombocytopenia, unspecified: Secondary | ICD-10-CM | POA: Diagnosis present

## 2013-04-18 DIAGNOSIS — N183 Chronic kidney disease, stage 3 unspecified: Secondary | ICD-10-CM | POA: Diagnosis present

## 2013-04-18 DIAGNOSIS — G8929 Other chronic pain: Secondary | ICD-10-CM | POA: Diagnosis present

## 2013-04-18 DIAGNOSIS — R5381 Other malaise: Secondary | ICD-10-CM

## 2013-04-18 DIAGNOSIS — K8020 Calculus of gallbladder without cholecystitis without obstruction: Secondary | ICD-10-CM

## 2013-04-18 DIAGNOSIS — E870 Hyperosmolality and hypernatremia: Secondary | ICD-10-CM | POA: Diagnosis present

## 2013-04-18 DIAGNOSIS — F411 Generalized anxiety disorder: Secondary | ICD-10-CM

## 2013-04-18 DIAGNOSIS — R5383 Other fatigue: Secondary | ICD-10-CM

## 2013-04-18 DIAGNOSIS — F329 Major depressive disorder, single episode, unspecified: Secondary | ICD-10-CM

## 2013-04-18 DIAGNOSIS — T502X5A Adverse effect of carbonic-anhydrase inhibitors, benzothiadiazides and other diuretics, initial encounter: Secondary | ICD-10-CM | POA: Diagnosis present

## 2013-04-18 DIAGNOSIS — Z803 Family history of malignant neoplasm of breast: Secondary | ICD-10-CM

## 2013-04-18 DIAGNOSIS — E875 Hyperkalemia: Secondary | ICD-10-CM | POA: Diagnosis present

## 2013-04-18 DIAGNOSIS — N3281 Overactive bladder: Secondary | ICD-10-CM

## 2013-04-18 DIAGNOSIS — B1710 Acute hepatitis C without hepatic coma: Secondary | ICD-10-CM

## 2013-04-18 DIAGNOSIS — I5032 Chronic diastolic (congestive) heart failure: Secondary | ICD-10-CM | POA: Diagnosis present

## 2013-04-18 DIAGNOSIS — N320 Bladder-neck obstruction: Secondary | ICD-10-CM

## 2013-04-18 DIAGNOSIS — I10 Essential (primary) hypertension: Secondary | ICD-10-CM

## 2013-04-18 DIAGNOSIS — Z801 Family history of malignant neoplasm of trachea, bronchus and lung: Secondary | ICD-10-CM

## 2013-04-18 DIAGNOSIS — N179 Acute kidney failure, unspecified: Principal | ICD-10-CM | POA: Diagnosis present

## 2013-04-18 DIAGNOSIS — J309 Allergic rhinitis, unspecified: Secondary | ICD-10-CM

## 2013-04-18 DIAGNOSIS — E876 Hypokalemia: Secondary | ICD-10-CM

## 2013-04-18 DIAGNOSIS — N529 Male erectile dysfunction, unspecified: Secondary | ICD-10-CM

## 2013-04-18 DIAGNOSIS — I739 Peripheral vascular disease, unspecified: Secondary | ICD-10-CM | POA: Diagnosis present

## 2013-04-18 DIAGNOSIS — M5137 Other intervertebral disc degeneration, lumbosacral region: Secondary | ICD-10-CM

## 2013-04-18 DIAGNOSIS — N189 Chronic kidney disease, unspecified: Secondary | ICD-10-CM

## 2013-04-18 DIAGNOSIS — Z823 Family history of stroke: Secondary | ICD-10-CM

## 2013-04-18 DIAGNOSIS — F17200 Nicotine dependence, unspecified, uncomplicated: Secondary | ICD-10-CM | POA: Diagnosis present

## 2013-04-18 DIAGNOSIS — F3289 Other specified depressive episodes: Secondary | ICD-10-CM | POA: Diagnosis present

## 2013-04-18 DIAGNOSIS — D649 Anemia, unspecified: Secondary | ICD-10-CM

## 2013-04-18 DIAGNOSIS — K5730 Diverticulosis of large intestine without perforation or abscess without bleeding: Secondary | ICD-10-CM

## 2013-04-18 DIAGNOSIS — I129 Hypertensive chronic kidney disease with stage 1 through stage 4 chronic kidney disease, or unspecified chronic kidney disease: Secondary | ICD-10-CM | POA: Diagnosis present

## 2013-04-18 LAB — CBC WITH DIFFERENTIAL/PLATELET
BASOS ABS: 0 10*3/uL (ref 0.0–0.1)
Basophils Relative: 0 % (ref 0–1)
EOS ABS: 0.2 10*3/uL (ref 0.0–0.7)
Eosinophils Relative: 5 % (ref 0–5)
HCT: 33.6 % — ABNORMAL LOW (ref 39.0–52.0)
Hemoglobin: 11 g/dL — ABNORMAL LOW (ref 13.0–17.0)
LYMPHS ABS: 1.7 10*3/uL (ref 0.7–4.0)
Lymphocytes Relative: 35 % (ref 12–46)
MCH: 28.5 pg (ref 26.0–34.0)
MCHC: 32.7 g/dL (ref 30.0–36.0)
MCV: 87 fL (ref 78.0–100.0)
Monocytes Absolute: 0.5 10*3/uL (ref 0.1–1.0)
Monocytes Relative: 10 % (ref 3–12)
NEUTROS PCT: 50 % (ref 43–77)
Neutro Abs: 2.4 10*3/uL (ref 1.7–7.7)
Platelets: 95 10*3/uL — ABNORMAL LOW (ref 150–400)
RBC: 3.86 MIL/uL — AB (ref 4.22–5.81)
RDW: 13.8 % (ref 11.5–15.5)
WBC: 4.7 10*3/uL (ref 4.0–10.5)

## 2013-04-18 LAB — COMPREHENSIVE METABOLIC PANEL
ALK PHOS: 94 U/L (ref 39–117)
ALT: 37 U/L (ref 0–53)
AST: 42 U/L — AB (ref 0–37)
Albumin: 3.2 g/dL — ABNORMAL LOW (ref 3.5–5.2)
BUN: 76 mg/dL — ABNORMAL HIGH (ref 6–23)
CALCIUM: 9.5 mg/dL (ref 8.4–10.5)
CHLORIDE: 111 meq/L (ref 96–112)
CO2: 20 mEq/L (ref 19–32)
Creatinine, Ser: 3.28 mg/dL — ABNORMAL HIGH (ref 0.50–1.35)
GFR calc Af Amer: 20 mL/min — ABNORMAL LOW (ref >90)
GFR, EST NON AFRICAN AMERICAN: 17 mL/min — AB (ref >90)
GLUCOSE: 148 mg/dL — AB (ref 70–99)
POTASSIUM: 5.6 meq/L — AB (ref 3.7–5.3)
SODIUM: 145 meq/L (ref 137–147)
Total Bilirubin: 0.2 mg/dL — ABNORMAL LOW (ref 0.3–1.2)
Total Protein: 8.5 g/dL — ABNORMAL HIGH (ref 6.0–8.3)

## 2013-04-18 LAB — PRO B NATRIURETIC PEPTIDE: Pro B Natriuretic peptide (BNP): 383 pg/mL — ABNORMAL HIGH (ref 0–125)

## 2013-04-18 LAB — POCT I-STAT TROPONIN I: TROPONIN I, POC: 0 ng/mL (ref 0.00–0.08)

## 2013-04-18 LAB — GLUCOSE, CAPILLARY: Glucose-Capillary: 151 mg/dL — ABNORMAL HIGH (ref 70–99)

## 2013-04-18 MED ORDER — SODIUM CHLORIDE 0.9 % IV BOLUS (SEPSIS)
500.0000 mL | Freq: Once | INTRAVENOUS | Status: AC
  Administered 2013-04-18: 500 mL via INTRAVENOUS

## 2013-04-18 MED ORDER — ACETAMINOPHEN 650 MG RE SUPP: 650.0000 mg | Freq: Four times a day (QID) | RECTAL | Status: DC | PRN

## 2013-04-18 MED ORDER — ONDANSETRON HCL 4 MG PO TABS
4.0000 mg | ORAL_TABLET | Freq: Four times a day (QID) | ORAL | Status: DC | PRN
Start: 2013-04-18 — End: 2013-04-21

## 2013-04-18 MED ORDER — AEROCHAMBER PLUS W/MASK MISC
Status: AC
Start: 2013-04-18 — End: 2013-04-19
  Filled 2013-04-18: qty 1

## 2013-04-18 MED ORDER — METOPROLOL TARTRATE 50 MG PO TABS
50.0000 mg | ORAL_TABLET | Freq: Two times a day (BID) | ORAL | Status: DC
  Administered 2013-04-18 – 2013-04-21 (×6): 50 mg via ORAL
  Filled 2013-04-18 (×6): qty 1
  Filled 2013-04-18: qty 2

## 2013-04-18 MED ORDER — SODIUM CHLORIDE 0.9 % IV SOLN
INTRAVENOUS | Status: DC
  Administered 2013-04-19: 1000 mL via INTRAVENOUS

## 2013-04-18 MED ORDER — ENOXAPARIN SODIUM 30 MG/0.3ML ~~LOC~~ SOLN: 30.0000 mg | SUBCUTANEOUS | Status: DC

## 2013-04-18 MED ORDER — ONDANSETRON HCL 4 MG/2ML IJ SOLN: 4.0000 mg | Freq: Four times a day (QID) | INTRAMUSCULAR | Status: DC | PRN

## 2013-04-18 MED ORDER — ZOLPIDEM TARTRATE 5 MG PO TABS
5.0000 mg | ORAL_TABLET | Freq: Every evening | ORAL | Status: DC | PRN
  Administered 2013-04-19: 5 mg via ORAL
  Filled 2013-04-18: qty 1

## 2013-04-18 MED ORDER — TAMSULOSIN HCL 0.4 MG PO CAPS
0.4000 mg | ORAL_CAPSULE | Freq: Every day | ORAL | Status: DC
  Administered 2013-04-19 – 2013-04-21 (×4): 0.4 mg via ORAL
  Filled 2013-04-18 (×5): qty 1

## 2013-04-18 MED ORDER — CITALOPRAM HYDROBROMIDE 10 MG PO TABS
10.0000 mg | ORAL_TABLET | Freq: Every day | ORAL | Status: DC
  Administered 2013-04-18 – 2013-04-21 (×4): 10 mg via ORAL
  Filled 2013-04-18 (×4): qty 1

## 2013-04-18 MED ORDER — ACETAMINOPHEN 325 MG PO TABS: 650.0000 mg | ORAL_TABLET | Freq: Four times a day (QID) | ORAL | Status: DC | PRN

## 2013-04-18 MED ORDER — FLUTICASONE PROPIONATE 50 MCG/ACT NA SUSP
2.0000 | Freq: Every day | NASAL | Status: DC
  Administered 2013-04-19 – 2013-04-21 (×3): 2 via NASAL
  Filled 2013-04-18: qty 16

## 2013-04-18 MED ORDER — SODIUM POLYSTYRENE SULFONATE 15 GM/60ML PO SUSP
15.0000 g | Freq: Once | ORAL | Status: AC
  Administered 2013-04-18: 15 g via ORAL
  Filled 2013-04-18: qty 60

## 2013-04-18 MED ORDER — HYDROMORPHONE HCL PF 1 MG/ML IJ SOLN
0.5000 mg | INTRAMUSCULAR | Status: DC | PRN
  Administered 2013-04-19: 1 mg via INTRAVENOUS
  Filled 2013-04-18: qty 1

## 2013-04-18 MED ORDER — ALBUTEROL SULFATE HFA 108 (90 BASE) MCG/ACT IN AERS
1.0000 | INHALATION_SPRAY | Freq: Four times a day (QID) | RESPIRATORY_TRACT | Status: DC | PRN
  Administered 2013-04-18: 2 via RESPIRATORY_TRACT
  Filled 2013-04-18: qty 13.4

## 2013-04-18 MED ORDER — OXYCODONE HCL 5 MG PO TABS
5.0000 mg | ORAL_TABLET | ORAL | Status: DC | PRN
  Administered 2013-04-19 – 2013-04-20 (×2): 5 mg via ORAL
  Filled 2013-04-18 (×2): qty 1

## 2013-04-18 MED ORDER — ONDANSETRON HCL 4 MG/2ML IJ SOLN
4.0000 mg | Freq: Once | INTRAMUSCULAR | Status: AC
  Administered 2013-04-18: 4 mg via INTRAVENOUS

## 2013-04-18 MED ORDER — CLORAZEPATE DIPOTASSIUM 7.5 MG PO TABS
7.5000 mg | ORAL_TABLET | Freq: Three times a day (TID) | ORAL | Status: DC | PRN
  Filled 2013-04-18: qty 1

## 2013-04-18 MED ORDER — CYCLOSPORINE 0.05 % OP EMUL
1.0000 [drp] | Freq: Two times a day (BID) | OPHTHALMIC | Status: DC
  Administered 2013-04-19 – 2013-04-21 (×5): 1 [drp] via OPHTHALMIC
  Filled 2013-04-18 (×7): qty 1

## 2013-04-18 MED ORDER — FESOTERODINE FUMARATE ER 4 MG PO TB24
4.0000 mg | ORAL_TABLET | Freq: Every day | ORAL | Status: DC
  Administered 2013-04-19 – 2013-04-21 (×3): 4 mg via ORAL
  Filled 2013-04-18 (×3): qty 1

## 2013-04-18 MED ORDER — SODIUM CHLORIDE 0.9 % IJ SOLN
3.0000 mL | Freq: Two times a day (BID) | INTRAMUSCULAR | Status: DC
  Administered 2013-04-19 – 2013-04-21 (×5): 3 mL via INTRAVENOUS

## 2013-04-18 MED ORDER — ALBUTEROL SULFATE (2.5 MG/3ML) 0.083% IN NEBU: 2.5000 mg | INHALATION_SOLUTION | Freq: Four times a day (QID) | RESPIRATORY_TRACT | Status: DC | PRN

## 2013-04-18 NOTE — ED Notes (Signed)
Patient states that he just lost his balance, denies hitting head or loc, per ems, family reports increasing weakness x 2 weeks.  Family on the way

## 2013-04-18 NOTE — ED Provider Notes (Signed)
CSN: [id number]     Arrival date & time 04/18/13  1901 History   First MD Initiated Contact with Patient 04/18/13 1905     Chief Complaint  Patient presents with  . Fall  . Weakness   (Consider location/radiation/quality/duration/timing/severity/associated sxs/prior Treatment) The history is provided by the patient.  KINGSTIN HEIMS is a 73 y.o. male hx of CHF, HL, HTN here with weakness. He was recently admitted for CHF exacerbation. Since discharge about weeks ago he is persistently weak. Weakness are worse today. After eating dinner he went to the bathroom and weak and fell on the bathroom and was unable to get up. Denies chest pain or shortness of breath or fevers. No syncope or head injury.    Past Medical History  Diagnosis Date  . ALLERGIC RHINITIS 03/28/2009  . ANXIETY 03/28/2009  . CHOLELITHIASIS 03/28/2009  . DEGENERATIVE JOINT DISEASE 03/28/2009  . DEPRESSION 03/28/2009  . [place] DISEASE, LUMBAR 03/28/2009  . DIVERTICULOSIS, COLON 03/28/2009  . 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 FATIGUE 03/28/2009  . HEPATITIS C 03/28/2009  . HYPERLIPIDEMIA 03/28/2009  . HYPERTENSION 03/28/2009  . LOW BACK PAIN, CHRONIC 03/28/2009  . PERIPHERAL VASCULAR DISEASE 03/28/2009  . Personal history of alcoholism 03/28/2009  . RENAL INSUFFICIENCY 03/28/2009  . THROMBOCYTOPENIA 03/28/2009  . Wheezing 02/07/2010  . Gallstones   . CHF (congestive heart failure)    Past Surgical History  Procedure Laterality Date  . Right shoulder surgery  2003   Family History  Problem Relation Age of Onset  . Lung cancer Father   . Stroke Brother   . Breast cancer Daughter    History  Substance Use Topics  . Smoking status: Current Every Day Smoker -- 0.25 packs/day    Types: Cigarettes  . Smokeless tobacco: Not on file  . Alcohol Use: No    Review of Systems  Neurological: Positive for weakness.  All other systems reviewed and are negative.    Allergies  Tramadol  Home Medications   Current Outpatient Rx  Name   Route  Sig  Dispense  Refill  . albuterol (PROVENTIL HFA;VENTOLIN HFA) 108 (90 BASE) MCG/ACT inhaler   Inhalation   Inhale 1-2 puffs into the lungs every 6 (six) hours as needed for wheezing or shortness of breath.         . citalopram (CELEXA) 10 MG tablet   Oral   Take 10 mg by mouth daily.         . clorazepate (TRANXENE) 7.5 MG tablet   Oral   Take 7.5 mg by mouth 3 (three) times daily as needed for anxiety.         . cycloSPORINE (RESTASIS) 0.05 % ophthalmic emulsion   Both Eyes   Place 1 drop into both eyes 2 (two) times daily.         . fesoterodine (TOVIAZ) 4 MG TB24   Oral   Take 4 mg by mouth daily.         . fluticasone (FLONASE) 50 MCG/ACT nasal spray   Each Nare   Place 2 sprays into both nostrils daily.         . furosemide (LASIX) 40 MG tablet   Oral   Take 40 mg by mouth daily.         Marland Kitchen HYDROcodone-acetaminophen (NORCO/VICODIN) 5-325 MG per tablet   Oral   Take 1 tablet by mouth every 6 (six) hours as needed for moderate pain.         Marland Kitchen  ibuprofen (ADVIL,MOTRIN) 800 MG tablet   Oral   Take 800 mg by mouth 2 (two) times daily as needed for mild pain.         . metoprolol (LOPRESSOR) 100 MG tablet   Oral   Take 50 mg by mouth 2 (two) times daily.         . potassium chloride (K-DUR) 10 MEQ tablet   Oral   Take 10 mEq by mouth 2 (two) times daily.         . tamsulosin (FLOMAX) 0.4 MG CAPS capsule   Oral   Take 0.4 mg by mouth daily.          BP 139/77  Pulse 77  Temp(Src) 98.5 F (36.9 C) (Oral)  Resp 18  SpO2 96% Physical Exam  Nursing note and vitals reviewed. Constitutional: He is oriented to person, place, and time.  Chronically ill, tired   HENT:  Head: Normocephalic.  Mouth/Throat: Oropharynx is clear and moist.  Eyes: Conjunctivae are normal. Pupils are equal, round, and reactive to light.  Neck: Normal range of motion. Neck supple.  Cardiovascular: Normal rate, regular rhythm and normal heart sounds.    Pulmonary/Chest: Effort normal.  Bibasilar crackles   Abdominal: Soft. Bowel sounds are normal. He exhibits no distension. There is no tenderness. There is no rebound.  Musculoskeletal: Normal range of motion.  1+ edema   Neurological: He is alert and oriented to person, place, and time. No cranial nerve deficit. Coordination normal.  Skin: Skin is warm and dry.  Psychiatric: He has a normal mood and affect. His behavior is normal. Judgment and thought content normal.    ED Course  Procedures (including critical care time) Labs Review Labs Reviewed  GLUCOSE, CAPILLARY - Abnormal; Notable for the following:    Glucose-Capillary 151 (*)    All other components within normal limits  CBC WITH DIFFERENTIAL - Abnormal; Notable for the following:    RBC 3.86 (*)    Hemoglobin 11.0 (*)    HCT 33.6 (*)    Platelets 95 (*)    All other components within normal limits  COMPREHENSIVE METABOLIC PANEL - Abnormal; Notable for the following:    Potassium 5.6 (*)    Glucose, Bld 148 (*)    BUN 76 (*)    Creatinine, Ser 3.28 (*)    Total Protein 8.5 (*)    Albumin 3.2 (*)    AST 42 (*)    Total Bilirubin 0.2 (*)    GFR calc non Af Amer 17 (*)    GFR calc Af Amer 20 (*)    All other components within normal limits  PRO B NATRIURETIC PEPTIDE - Abnormal; Notable for the following:    Pro B Natriuretic peptide (BNP) 383.0 (*)    All other components within normal limits  URINALYSIS, ROUTINE W REFLEX MICROSCOPIC  POCT I-STAT TROPONIN I   Imaging Review Dg Chest 1 View  04/18/2013   CLINICAL DATA:  Fall.  EXAM: CHEST - 1 VIEW  COMPARISON:  03/25/2013.  FINDINGS: Mediastinum and hilar structures normal. Poor inspiration. Lungs are clear. No pleural effusion or pneumothorax. Heart size normal. Normal pulmonary vascularity. No acute bony abnormality.  IMPRESSION: No active disease.   Electronically Signed   By: Marcello Moores  Register   On: 04/18/2013 20:56   Dg Pelvis 1-2 Views  04/18/2013   CLINICAL  DATA:  Fall.  EXAM: PELVIS - 1-2 VIEW  COMPARISON:  None.  FINDINGS: Exam demonstrates mild symmetric degenerative change  of the hips. There is no acute fracture or dislocation. There are degenerative changes over the spot.  IMPRESSION: No acute findings.   Electronically Signed   By: Marin Olp M.D.   On: 04/18/2013 21:00    EKG Interpretation    Date/Time:  Saturday April 18 2013 19:29:48 EST Ventricular Rate:  72 PR Interval:  190 QRS Duration: 79 QT Interval:  384 QTC Calculation: 420 R Axis:   74 Text Interpretation:  Sinus rhythm Probable left atrial enlargement Repol abnrm suggests ischemia, anterolateral ST elevation, consider anterior injury No significant change since last tracing Confirmed by Clark Cuff  MD, Saadia Dewitt [phone]) on 04/18/2013 8:37:09 PM            MDM  No diagnosis found. LEANDRA CHISUM is a 73 y.o. male here with weakness. He is on lasix for diuresis and looks slightly dehydrated. Labs showed acute renal failure likely from over diuresis. K 5.6, no EKG changes. Given kayexelate. cxr unremarkable. Will admit for hyperkalemia, acute renal failure on tele.      Wandra Arthurs, MD 04/18/13 2154

## 2013-04-18 NOTE — H&P (Addendum)
[facility] History and Physical  Gerald Gomez KXF:[id number] DOB: 1940/10/31 DOA: 04/18/2013  Referring physician:  EDP PCP: [name], MD  Specialists:   Chief Complaint:  Increased Weakness and Fall  HPI: Gerald Gomez is a 73 y.o. male with Multiple Medical Problems who presents to the ED with complaints of worsening weakness for the past 3 weeks and has had unsteadiness on his feet due to weakness in both of his legs.  He reports falling today in his bathroom due to weakness, he denies having any syncope or chest pain.   He has also had decreased intake of foods and liquids .  He was discharged from the hospital on 04/03/2013 after a hospitalized for a CHF exacerbation and on 03/25/2013 for the same.  In the ED, He was evaluated and was found to have an increase in his Bun/Cr  Today at 76/2.28 and previously his creatinine had been 1.7.   He was also found to have hyperkalemia at 5.6.  He was given gentle IVFs, and 15 grams of Kayexalate x 1 and referred for medical admission.      Review of Systems:  Constitutional: No Night Sweats, Fevers, Chills, +Fatigue, +Generalized Weakness HEENT: No headaches, Difficulty swallowing,Tooth/dental problems,Sore throat,  No sneezing, itching, ear ache, nasal congestion, post nasal drip,  Cardio-vascular:  No Chest Pain, Orthopnea, PND, Edema in lower extremities, Anasarca, +Dizziness, palpitations  Resp: No shortness of breath, DOE. No excess mucus, no productive cough, No non-productive cough, No hemoptysis, No change in color of mucus.No wheezing.No chest wall deformity GI: No heartburn, indigestion, abdominal pain, nausea, vomiting, diarrhea, change in bowel habits, loss of appetite  GU: no dysuria, change in color of urine, no urgency or frequency. No flank pain.  Musculoskeletal: No joint pain or swelling. No decreased range of motion. No back pain.  Neurologic: No syncope, No Seizures, Muscle Weakness, Paresthesia, Vision disturbance or  Loss, No Diplopia, No Vertigo, +Difficulty Walking,  Skin: no rash or lesions. Psych: No change in mood or affect. No depression or anxiety. No memory loss. No confusion or hallucinations   Past Medical History  Diagnosis Date  . ALLERGIC RHINITIS 03/28/2009  . ANXIETY 03/28/2009  . CHOLELITHIASIS 03/28/2009  . DEGENERATIVE JOINT DISEASE 03/28/2009  . DEPRESSION 03/28/2009  . [place] DISEASE, LUMBAR 03/28/2009  . DIVERTICULOSIS, COLON 03/28/2009  . 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 FATIGUE 03/28/2009  . HEPATITIS C 03/28/2009  . HYPERLIPIDEMIA 03/28/2009  . HYPERTENSION 03/28/2009  . LOW BACK PAIN, CHRONIC 03/28/2009  . PERIPHERAL VASCULAR DISEASE 03/28/2009  . Personal history of alcoholism 03/28/2009  . RENAL INSUFFICIENCY 03/28/2009  . THROMBOCYTOPENIA 03/28/2009  . Wheezing 02/07/2010  . Gallstones   . CHF (congestive heart failure)      Past Surgical History  Procedure Laterality Date  . Right shoulder surgery  2003    Prior to Admission medications   Medication Sig Start Date End Date Taking? Authorizing Provider  albuterol (PROVENTIL HFA;VENTOLIN HFA) 108 (90 BASE) MCG/ACT inhaler Inhale 1-2 puffs into the lungs every 6 (six) hours as needed for wheezing or shortness of breath.   Yes Historical Provider, MD  citalopram (CELEXA) 10 MG tablet Take 10 mg by mouth daily.   Yes Historical Provider, MD  clorazepate (TRANXENE) 7.5 MG tablet Take 7.5 mg by mouth 3 (three) times daily as needed for anxiety.   Yes Historical Provider, MD  cycloSPORINE (RESTASIS) 0.05 % ophthalmic emulsion Place 1 drop into both eyes 2 (two) times daily.  Yes Historical Provider, MD  fesoterodine (TOVIAZ) 4 MG TB24 Take 4 mg by mouth daily.   Yes Historical Provider, MD  fluticasone (FLONASE) 50 MCG/ACT nasal spray Place 2 sprays into both nostrils daily.   Yes Historical Provider, MD  furosemide (LASIX) 40 MG tablet Take 40 mg by mouth daily.   Yes Historical Provider, MD  HYDROcodone-acetaminophen (NORCO/VICODIN)  5-325 MG per tablet Take 1 tablet by mouth every 6 (six) hours as needed for moderate pain.   Yes Historical Provider, MD  ibuprofen (ADVIL,MOTRIN) 800 MG tablet Take 800 mg by mouth 2 (two) times daily as needed for mild pain.   Yes Historical Provider, MD  metoprolol (LOPRESSOR) 100 MG tablet Take 50 mg by mouth 2 (two) times daily.   Yes Historical Provider, MD  potassium chloride (K-DUR) 10 MEQ tablet Take 10 mEq by mouth 2 (two) times daily.   Yes Historical Provider, MD  tamsulosin (FLOMAX) 0.4 MG CAPS capsule Take 0.4 mg by mouth daily.   Yes Historical Provider, MD     Allergies  Allergen Reactions  . Tramadol Nausea Only     Social History:  reports that he has been smoking Cigarettes.  He has been smoking about 0.25 packs per day. He does not have any smokeless tobacco history on file. He reports that he does not drink alcohol or use illicit drugs.     Family History  Problem Relation Age of Onset  . Lung cancer Father   . Stroke Brother   . Breast cancer Daughter        Physical Exam:  GEN:  Pleasant Well Nourished and Well Developed Elderly  73 y.o. African American male examined  and in no acute distress; cooperative with exam Filed Vitals:   04/18/13 2000 04/18/13 2015 04/18/13 2048 04/18/13 2223  BP: 116/75 139/77  148/91  Pulse: 75 77  80  Temp:   98.5 F (36.9 C)   TempSrc:      Resp: 17 18    SpO2: 99% 96%     Blood pressure 148/91, pulse 80, temperature 98.5 F (36.9 C), temperature source Oral, resp. rate 18, SpO2 96.00%. PSYCH: He is alert and oriented x4; does not appear anxious does not appear depressed; affect is normal HEENT: Normocephalic and Atraumatic, Mucous membranes pink; PERRLA; EOM intact; Fundi:  Benign;  No scleral icterus, Nares: Patent, Oropharynx: Clear, Fair Dentition, Neck:  FROM, no cervical lymphadenopathy nor thyromegaly or carotid bruit; no JVD; Breasts:: Not examined CHEST WALL: No tenderness CHEST: Normal respiration, clear to  auscultation bilaterally HEART: Regular rate and rhythm; no murmurs rubs or gallops BACK: No kyphosis or scoliosis; no CVA tenderness ABDOMEN: Positive Bowel Sounds, soft non-tender; no masses, no organomegaly, no pannus; no intertriginous candida. Rectal Exam: Not done EXTREMITIES: No cyanosis, clubbing or edema; no ulcerations. Genitalia: not examined PULSES: 2+ and symmetric SKIN: Normal hydration no rash or ulceration CNS: Neurologic Examination:  Mental Status: Alert and Oriented x 4,  CN II-XII Intact, Sensory and Motor Intact, Cerebellar Fxn Intact, Gait: deferred  Vascular: pulses palpable throughout    Labs on Admission:  Basic Metabolic Panel:  Recent Labs Lab 04/18/13 1932  NA 145  K 5.6*  CL 111  CO2 20  GLUCOSE 148*  BUN 76*  CREATININE 3.28*  CALCIUM 9.5   Liver Function Tests:  Recent Labs Lab 04/18/13 1932  AST 42*  ALT 37  ALKPHOS 94  BILITOT 0.2*  PROT 8.5*  ALBUMIN 3.2*   No results found  for this basename: LIPASE, AMYLASE,  in the last 168 hours No results found for this basename: AMMONIA,  in the last 168 hours CBC:  Recent Labs Lab 04/18/13 1932  WBC 4.7  NEUTROABS 2.4  HGB 11.0*  HCT 33.6*  MCV 87.0  PLT 95*   Cardiac Enzymes: No results found for this basename: CKTOTAL, CKMB, CKMBINDEX, TROPONINI,  in the last 168 hours  BNP (last 3 results)  Recent Labs  03/25/13 0434 04/18/13 1932  PROBNP 7935.0* 383.0*   CBG:  Recent Labs Lab 04/18/13 1917  GLUCAP 151*    Radiological Exams on Admission: Dg Chest 1 View  04/18/2013   CLINICAL DATA:  Fall.  EXAM: CHEST - 1 VIEW  COMPARISON:  03/25/2013.  FINDINGS: Mediastinum and hilar structures normal. Poor inspiration. Lungs are clear. No pleural effusion or pneumothorax. Heart size normal. Normal pulmonary vascularity. No acute bony abnormality.  IMPRESSION: No active disease.   Electronically Signed   By: Marcello Moores  Register   On: 04/18/2013 20:56   Dg Pelvis 1-2  Views  04/18/2013   CLINICAL DATA:  Fall.  EXAM: PELVIS - 1-2 VIEW  COMPARISON:  None.  FINDINGS: Exam demonstrates mild symmetric degenerative change of the hips. There is no acute fracture or dislocation. There are degenerative changes over the spot.  IMPRESSION: No acute findings.   Electronically Signed   By: Marin Olp M.D.   On: 04/18/2013 21:00       Assessment/Plan:   73 y.o. male with  Principal Problem:   Renal failure (ARF), acute on chronic Active Problems:   Hyperkalemia   HYPERTENSION   Dehydration   CKD (chronic kidney disease) stage 3, GFR 30-59 ml/min   CHF (congestive heart failure)   HEPATITIS C   HYPERLIPIDEMIA   THROMBOCYTOPENIA     1.  Acute on Chronic Renal Failure/Dehydration - due to Lasix Rx-  Gently Rehydrate, Hold Lasix and K+ for now, restart after improvement in Cr back to baseline of 1.7.     2.  Hyperkalemia-  Received 1 dose of Kayexalate 15 grams X 1 in ED, Re-Check K+ in 6 hours.   Re-dose if  K+> 5.2.    3.  HTN-   PRN IV Hydralazine,  Continue Metoprolol, and Lasix on Hold for now.     4.  CHF- Monitor Fluid intake and rehydration to prevent fluid overload.  Restart Lasix Rx once Rehydration corrected.    5.  Hyperlipidemia-  Check Lipids.      6.  Thrombocytopenia-   Chronic,  Hx of Hep C,  Monitor.    7.  DVT prophylaxis with SCDs.         Code Status:   FULL CODE   Family Communication:  Wife at Bedside Disposition Plan:    Inpatient  Time spent:  [place] [facility] Pager [phone]  If 7PM-7AM, please contact night-coverage [email or web address] Password [facility] 04/18/2013, 10:46 PM

## 2013-04-18 NOTE — ED Notes (Signed)
Patient transported to CT 

## 2013-04-18 NOTE — ED Notes (Signed)
Patient transported to X-ray 

## 2013-04-18 NOTE — ED Notes (Signed)
Per family, patient has been increasing in weakness for "quite awhile" and falling frequently.  Today after dinner patient fell in bathroom, and did not hit head to her knowledge.

## 2013-04-18 NOTE — ED Notes (Signed)
Per EMS, weakness x 2 weeks.  Patient was eating dinner and went to go to the bathroom, patient then fell in the bathroom accidentally locking himself in.  Unknown mechanism of fall, patient denies head or neck pain.  Actively vomiting at arrival, alert and oriented

## 2013-04-19 LAB — BASIC METABOLIC PANEL
BUN: 74 mg/dL — ABNORMAL HIGH (ref 6–23)
CO2: 19 mEq/L (ref 19–32)
Calcium: 9 mg/dL (ref 8.4–10.5)
Chloride: 115 mEq/L — ABNORMAL HIGH (ref 96–112)
Creatinine, Ser: 3.06 mg/dL — ABNORMAL HIGH (ref 0.50–1.35)
GFR, EST AFRICAN AMERICAN: 22 mL/min — AB (ref >90)
GFR, EST NON AFRICAN AMERICAN: 19 mL/min — AB (ref >90)
Glucose, Bld: 100 mg/dL — ABNORMAL HIGH (ref 70–99)
Potassium: 5.7 mEq/L — ABNORMAL HIGH (ref 3.7–5.3)
SODIUM: 146 meq/L (ref 137–147)

## 2013-04-19 LAB — URINALYSIS, ROUTINE W REFLEX MICROSCOPIC
BILIRUBIN URINE: NEGATIVE
GLUCOSE, UA: NEGATIVE mg/dL
Ketones, ur: NEGATIVE mg/dL
Leukocytes, UA: NEGATIVE
Nitrite: NEGATIVE
Protein, ur: NEGATIVE mg/dL
Specific Gravity, Urine: 1.015 (ref 1.005–1.030)
Urobilinogen, UA: 0.2 mg/dL (ref 0.0–1.0)
pH: 5 (ref 5.0–8.0)

## 2013-04-19 LAB — URINE MICROSCOPIC-ADD ON

## 2013-04-19 LAB — CBC
HCT: 32.1 % — ABNORMAL LOW (ref 39.0–52.0)
Hemoglobin: 10.2 g/dL — ABNORMAL LOW (ref 13.0–17.0)
MCH: 27.9 pg (ref 26.0–34.0)
MCHC: 31.8 g/dL (ref 30.0–36.0)
MCV: 87.9 fL (ref 78.0–100.0)
Platelets: 75 10*3/uL — ABNORMAL LOW (ref 150–400)
RBC: 3.65 MIL/uL — ABNORMAL LOW (ref 4.22–5.81)
RDW: 14.1 % (ref 11.5–15.5)
WBC: 6.4 10*3/uL (ref 4.0–10.5)

## 2013-04-19 MED ORDER — HYDRALAZINE HCL 20 MG/ML IJ SOLN: 10.0000 mg | Freq: Four times a day (QID) | INTRAMUSCULAR | Status: DC | PRN

## 2013-04-19 MED ORDER — SODIUM POLYSTYRENE SULFONATE 15 GM/60ML PO SUSP
30.0000 g | Freq: Once | ORAL | Status: AC
  Administered 2013-04-19: 30 g via ORAL
  Filled 2013-04-19: qty 120

## 2013-04-19 MED ORDER — SODIUM CHLORIDE 0.9 % IV SOLN: INTRAVENOUS | Status: DC

## 2013-04-19 MED ORDER — SODIUM CHLORIDE 0.9 % IV SOLN
INTRAVENOUS | Status: AC
  Administered 2013-04-19 (×2): via INTRAVENOUS

## 2013-04-19 NOTE — Progress Notes (Signed)
[facility] PROGRESS NOTE Interim History: 73 y.o. male with Multiple Medical Problems who presents to the ED with complaints of worsening weakness for the past 3 weeks and has had unsteadiness on his feet due to weakness in both of his legs. He reports falling today in his bathroom due to weakness, he denies having any syncope or chest pain. He has also had decreased intake of foods and liquids . He was discharged from the hospital on 04/03/2013 after a hospitalized for a CHF exacerbation and on 03/25/2013 for the same   Assessment/Plan: Renal failure (ARF), acute on CKD (chronic kidney disease) stage 3, GFR 30-59 ml/min: - baseline Cr. 1.7. -  Improving with IV fluid and holding lasix and NSAID's. - cont strict I and O's. b-met in am.   Hyperkalemia: - Due to AKI. - Give Kayexalate as it it did not improve with hydration   CHF (congestive heart failure) - watch hydration with fluid as he does have HF.  HYPERTENSION - cont metoprolol. - hold all other medications.     Code Status: FULL CODE  Family Communication: Wife at Bedside  Disposition Plan: Inpatient    Consultants:  none  Procedures:  CXR  Antibiotics:  none  HPI/Subjective: Wants to go home  Objective: Filed Vitals:   04/18/13 2300 04/18/13 2336 04/19/13 0425 04/19/13 1057  BP: 145/71 169/91 145/72 137/80  Pulse: 73 72 75 74  Temp:  98 F (36.7 C) 97.7 F (36.5 C) 98.2 F (36.8 C)  TempSrc:  Oral Oral Oral  Resp: _0 Height:  6' (1.829 m)    Weight:  94.2 kg (207 lb 10.8 oz)    SpO2: 96% 100% 99% 97%    Intake/Output Summary (Last 24 hours) at 04/19/13 1217 Last data filed at 04/19/13 0800  Gross per 24 hour  Intake  127.5 ml  Output    200 ml  Net  -72.5 ml   Filed Weights   04/18/13 2336  Weight: 94.2 kg (207 lb 10.8 oz)    Exam:  General: Alert, awake, oriented x3, in no acute distress.  HEENT: No bruits, no goiter. -JVD Heart: Regular rate and rhythm, without  murmurs, rubs, gallops.  Lungs: Good air movement, clear to auscultation Abdomen: Soft, nontender, nondistended, positive bowel sounds.  Neuro: Grossly intact, nonfocal.   Data Reviewed: Basic Metabolic Panel:  Recent Labs Lab 04/18/13 1932 04/19/13 0500  NA 145 146  K 5.6* 5.7*  CL 111 115*  CO2 20 19  GLUCOSE 148* 100*  BUN 76* 74*  CREATININE 3.28* 3.06*  CALCIUM 9.5 9.0   Liver Function Tests:  Recent Labs Lab 04/18/13 1932  AST 42*  ALT 37  ALKPHOS 94  BILITOT 0.2*  PROT 8.5*  ALBUMIN 3.2*   No results found for this basename: LIPASE, AMYLASE,  in the last 168 hours No results found for this basename: AMMONIA,  in the last 168 hours CBC:  Recent Labs Lab 04/18/13 1932 04/19/13 0500  WBC 4.7 6.4  NEUTROABS 2.4  --   HGB 11.0* 10.2*  HCT 33.6* 32.1*  MCV 87.0 87.9  PLT 95* 75*   Cardiac Enzymes: No results found for this basename: CKTOTAL, CKMB, CKMBINDEX, TROPONINI,  in the last 168 hours BNP (last 3 results)  Recent Labs  03/25/13 0434 04/18/13 1932  PROBNP 7935.0* 383.0*   CBG:  Recent Labs Lab 04/18/13 1917  GLUCAP 151*    No results found for this or any previous visit (  from the past 240 hour(s)).   Studies: Dg Chest 1 View  04/18/2013   CLINICAL DATA:  Fall.  EXAM: CHEST - 1 VIEW  COMPARISON:  03/25/2013.  FINDINGS: Mediastinum and hilar structures normal. Poor inspiration. Lungs are clear. No pleural effusion or pneumothorax. Heart size normal. Normal pulmonary vascularity. No acute bony abnormality.  IMPRESSION: No active disease.   Electronically Signed   By: Marcello Moores  Register   On: 04/18/2013 20:56   Dg Pelvis 1-2 Views  04/18/2013   CLINICAL DATA:  Fall.  EXAM: PELVIS - 1-2 VIEW  COMPARISON:  None.  FINDINGS: Exam demonstrates mild symmetric degenerative change of the hips. There is no acute fracture or dislocation. There are degenerative changes over the spot.  IMPRESSION: No acute findings.   Electronically Signed   By: Marin Olp M.D.   On: 04/18/2013 21:00    Scheduled Meds: . citalopram  10 mg Oral Daily  . cycloSPORINE  1 drop Both Eyes BID  . fesoterodine  4 mg Oral Daily  . fluticasone  2 spray Each Nare Daily  . metoprolol  50 mg Oral BID  . sodium chloride  3 mL Intravenous Q12H  . tamsulosin  0.4 mg Oral Daily   Continuous Infusions: . sodium chloride 1,000 mL (04/19/13 0227)     Charlynne Cousins  [facility] Pager [phone]. If 8PM-8AM, please contact night-coverage at [email or web address], password [facility] 04/19/2013, 12:17 PM  LOS: 1 day

## 2013-04-19 NOTE — Progress Notes (Signed)
Utilization Review Completed.   Aadil Sur, RN, BSN Nurse Case Manager  

## 2013-04-20 DIAGNOSIS — I5032 Chronic diastolic (congestive) heart failure: Secondary | ICD-10-CM

## 2013-04-20 LAB — BASIC METABOLIC PANEL
BUN: 65 mg/dL — AB (ref 6–23)
CALCIUM: 8.8 mg/dL (ref 8.4–10.5)
CO2: 20 mEq/L (ref 19–32)
Chloride: 115 mEq/L — ABNORMAL HIGH (ref 96–112)
Creatinine, Ser: 2.5 mg/dL — ABNORMAL HIGH (ref 0.50–1.35)
GFR, EST AFRICAN AMERICAN: 28 mL/min — AB (ref >90)
GFR, EST NON AFRICAN AMERICAN: 24 mL/min — AB (ref >90)
Glucose, Bld: 92 mg/dL (ref 70–99)
Potassium: 4.9 mEq/L (ref 3.7–5.3)
Sodium: 150 mEq/L — ABNORMAL HIGH (ref 137–147)

## 2013-04-20 MED ORDER — DEXTROSE 5 % IV SOLN: INTRAVENOUS | Status: DC

## 2013-04-20 NOTE — Evaluation (Signed)
Physical Therapy Evaluation Patient Details Name: Gerald Gomez MRN: [id number] DOB: 1940-09-01 Today's Date: 04/20/2013 Time: [id number] PT Time Calculation (min): 42 min  PT Assessment / Plan / Recommendation History of Present Illness  73 y.o. male with Multiple Medical Problems who presents to the ED with complaints of worsening weakness for the past 3 weeks and has had unsteadiness on his feet due to weakness in both of his legs. He reports falling today in his bathroom due to weakness, he denies having any syncope or chest pain. He has also had decreased intake of foods and liquids . He was discharged from the hospital on 04/03/2013 after a hospitalized for a CHF exacerbation and on 03/25/2013 for the same  Clinical Impression  Patient presents with decreased safety with mobility due to deficits listed below.  Despite likely close to his baseline would benefit from skilled PT in the acute setting to maximize independence and safety prior to d/c home with wife assist and HHPT.    PT Assessment  Patient needs continued PT services    Follow Up Recommendations  Home health PT;Supervision/Assistance - 24 hour    Does the patient have the potential to tolerate intense rehabilitation    N/A  Barriers to Discharge  None      Equipment Recommendations  None recommended by PT    Recommendations for Other Services   None  Frequency Min 3X/week    Precautions / Restrictions Precautions Precautions: Fall Precaution Comments: recent fall in bathroom and 2 weeks ago; states felt weak   Pertinent Vitals/Pain Pain in back chronic      Mobility  Bed Mobility Overal bed mobility: Needs Assistance Bed Mobility: Supine to Sit;Sit to Supine Supine to sit: Min assist;HOB elevated Sit to supine: HOB elevated;Min assist General bed mobility comments: sleeps in recliner at home; assist to lift trunk to sit and to get feet in bed to supine Transfers Overall transfer level: Modified  independent Equipment used: Rolling walker (2 wheeled) Ambulation/Gait Ambulation/Gait assistance: Min guard;Supervision Ambulation Distance (Feet): 120 Feet Assistive device: Rolling walker (2 wheeled) Gait Pattern/deviations: Step-through pattern;Decreased stride length;Narrow base of support;Shuffle;Trunk flexed Gait velocity interpretation: <1.8 ft/sec, indicative of risk for recurrent falls General Gait Details: trunk extremely flexed at times leans on walker with elbows (reports back DDD due to bak injury in 1961.)  cues for posture, able to walk in room no device supervision for safety; took at least 10 min to walk 120' stopping a lot and talking and slow speed when walking    Exercises     PT Diagnosis: Abnormality of gait;Generalized weakness  PT Problem List: Decreased balance;Decreased mobility;Decreased safety awareness;Decreased strength PT Treatment Interventions: DME instruction;Balance training;Gait training;Stair training;Functional mobility training;Patient/family education;Therapeutic activities;Therapeutic exercise     PT Goals(Current goals can be found in the care plan section) Acute Rehab PT Goals Patient Stated Goal: To go home PT Goal Formulation: With patient Time For Goal Achievement: 05/04/13 Potential to Achieve Goals: Good  Visit Information  Last PT Received On: 04/20/13 Assistance Needed: +1 History of Present Illness: 73 y.o. male with Multiple Medical Problems who presents to the ED with complaints of worsening weakness for the past 3 weeks and has had unsteadiness on his feet due to weakness in both of his legs. He reports falling today in his bathroom due to weakness, he denies having any syncope or chest pain. He has also had decreased intake of foods and liquids . He was discharged from the hospital on 04/03/2013 after  a hospitalized for a CHF exacerbation and on 03/25/2013 for the same       Prior Lindsay expects  to be discharged to:: Private residence Living Arrangements: Spouse/significant other Available Help at Discharge: Family Type of Home: House Home Access: Stairs to enter [employer] of Steps: 3 in front on rail; 2 in back 2 rails Entrance Stairs-Rails: Right;Left;Can reach both [place]: One [place]: [place] - 2 wheels;Cane - single point;Grab bars - tub/shower Prior Function Level of Independence: Independent with assistive device(s) Communication Communication: No difficulties    Cognition  Cognition Arousal/Alertness: Awake/alert Behavior During Therapy: WFL for tasks assessed/performed Overall Cognitive Status: Impaired/Different from baseline Area of Impairment: Safety/judgement Memory: Decreased short-term memory (did not recall admission in dec) Safety/Judgement: Decreased awareness of safety;Decreased awareness of deficits    Extremity/Trunk Assessment Lower Extremity Assessment Lower Extremity Assessment: Generalized weakness Cervical / Trunk Assessment Cervical / Trunk Assessment: Kyphotic   Balance Balance Overall balance assessment: History of Falls;Needs assistance Sitting-balance support: Feet unsupported Sitting balance-Leahy Scale: Fair Standing balance support: No upper extremity supported Standing balance-Leahy Scale: Fair Standing balance comment: able to stand without UE assist; even picked up one leg without UE assist, but seems unsteady without UE support  End of Session PT - End of Session Equipment Utilized During Treatment: Gait belt Activity Tolerance: Patient tolerated treatment well Patient left: in bed;with bed alarm set;with call bell/phone within reach  GP     [facility] 04/20/2013, [place], [place] 04/20/2013

## 2013-04-20 NOTE — Progress Notes (Signed)
[facility] PROGRESS NOTE Interim History: 73 y.o. male with Multiple Medical Problems who presents to the ED with complaints of worsening weakness for the past 3 weeks and has had unsteadiness on his feet due to weakness in both of his legs. He reports falling today in his bathroom due to weakness, he denies having any syncope or chest pain. He has also had decreased intake of foods and liquids . He was discharged from the hospital on 04/03/2013 after a hospitalized for a CHF exacerbation and on 03/25/2013 for the same   Assessment/Plan: Renal failure (ARF), acute on CKD (chronic kidney disease) stage 3, GFR 30-59 ml/min: - Baseline Cr. 1.7. Improved with IV fluids. - KVO IV fluid and holding lasix and NSAID's. - cont strict I and O's. b-met in am.   Hyperkalemia: - Due to AKI. - Give Kayexalate as it it did not improve with hydration  Hypernatremia: - due to NS. D/c   Chronic diastolic  heart failure: - watch hydration with fluid as he does have HF. - metoprolol. Cont to hold lasix.  HYPERTENSION - cont metoprolol. - hold all other medications.   Code Status: FULL CODE  Family Communication: Wife at Bedside  Disposition Plan: Inpatient  Consultants:  none  Procedures:  CXR  Antibiotics:  none  HPI/Subjective: Wants to go home  Objective: Filed Vitals:   04/19/13 2025 04/20/13 0408 04/20/13 0900 04/20/13 1309  BP: 142/79 152/85 154/77 139/55  Pulse: 70 73 66 68  Temp: 97.5 F (36.4 C) 97.7 F (36.5 C) 97.7 F (36.5 C) 98.2 F (36.8 C)  TempSrc: Oral  Oral Oral  Resp: $Remo'20 18 18 18  'xoqaV$ Height: 6' (1.829 m)     Weight: 96.219 kg (212 lb 2 oz)     SpO2: 97% 97% 95% 98%    Intake/Output Summary (Last 24 hours) at 04/20/13 1328 Last data filed at 04/20/13 1300  Gross per 24 hour  Intake   1080 ml  Output   1325 ml  Net   -245 ml   Filed Weights   04/18/13 2336 04/19/13 2025  Weight: 94.2 kg (207 lb 10.8 oz) 96.219 kg (212 lb 2 oz)     Exam:  General: Alert, awake, oriented x3, in no acute distress.  HEENT: No bruits, no goiter. -JVD Heart: Regular rate and rhythm, without murmurs, rubs, gallops.  Lungs: Good air movement, clear to auscultation Abdomen: Soft, nontender, nondistended, positive bowel sounds.  Neuro: Grossly intact, nonfocal.   Data Reviewed: Basic Metabolic Panel:  Recent Labs Lab 04/18/13 1932 04/19/13 0500 04/20/13 0450  NA 145 146 150*  K 5.6* 5.7* 4.9  CL 111 115* 115*  CO2 $Re'20 19 20  'OfC$ GLUCOSE 148* 100* 92  BUN 76* 74* 65*  CREATININE 3.28* 3.06* 2.50*  CALCIUM 9.5 9.0 8.8   Liver Function Tests:  Recent Labs Lab 04/18/13 1932  AST 42*  ALT 37  ALKPHOS 94  BILITOT 0.2*  PROT 8.5*  ALBUMIN 3.2*   No results found for this basename: LIPASE, AMYLASE,  in the last 168 hours No results found for this basename: AMMONIA,  in the last 168 hours CBC:  Recent Labs Lab 04/18/13 1932 04/19/13 0500  WBC 4.7 6.4  NEUTROABS 2.4  --   HGB 11.0* 10.2*  HCT 33.6* 32.1*  MCV 87.0 87.9  PLT 95* 75*   Cardiac Enzymes: No results found for this basename: CKTOTAL, CKMB, CKMBINDEX, TROPONINI,  in the last 168 hours BNP (last 3 results)  Recent  Labs  03/25/13 0434 04/18/13 1932  PROBNP 7935.0* 383.0*   CBG:  Recent Labs Lab 04/18/13 1917  GLUCAP 151*    No results found for this or any previous visit (from the past 240 hour(s)).   Studies: Dg Chest 1 View  04/18/2013   CLINICAL DATA:  Fall.  EXAM: CHEST - 1 VIEW  COMPARISON:  03/25/2013.  FINDINGS: Mediastinum and hilar structures normal. Poor inspiration. Lungs are clear. No pleural effusion or pneumothorax. Heart size normal. Normal pulmonary vascularity. No acute bony abnormality.  IMPRESSION: No active disease.   Electronically Signed   By: Marcello Moores  Register   On: 04/18/2013 20:56   Dg Pelvis 1-2 Views  04/18/2013   CLINICAL DATA:  Fall.  EXAM: PELVIS - 1-2 VIEW  COMPARISON:  None.  FINDINGS: Exam demonstrates mild  symmetric degenerative change of the hips. There is no acute fracture or dislocation. There are degenerative changes over the spot.  IMPRESSION: No acute findings.   Electronically Signed   By: Marin Olp M.D.   On: 04/18/2013 21:00    Scheduled Meds: . citalopram  10 mg Oral Daily  . cycloSPORINE  1 drop Both Eyes BID  . fesoterodine  4 mg Oral Daily  . fluticasone  2 spray Each Nare Daily  . metoprolol  50 mg Oral BID  . sodium chloride  3 mL Intravenous Q12H  . tamsulosin  0.4 mg Oral Daily   Continuous Infusions: . dextrose       Charlynne Cousins  [facility] Pager [phone]. If 8PM-8AM, please contact night-coverage at [email or web address], password [facility] 04/20/2013, 1:28 PM  LOS: 2 days

## 2013-04-21 ENCOUNTER — Ambulatory Visit: Payer: Medicare Other | Admitting: Internal Medicine

## 2013-04-21 DIAGNOSIS — E876 Hypokalemia: Secondary | ICD-10-CM

## 2013-04-21 LAB — BASIC METABOLIC PANEL
BUN: 55 mg/dL — ABNORMAL HIGH (ref 6–23)
CO2: 19 mEq/L (ref 19–32)
Calcium: 9.2 mg/dL (ref 8.4–10.5)
Chloride: 111 mEq/L (ref 96–112)
Creatinine, Ser: 2.2 mg/dL — ABNORMAL HIGH (ref 0.50–1.35)
GFR calc Af Amer: 32 mL/min — ABNORMAL LOW (ref >90)
GFR calc non Af Amer: 28 mL/min — ABNORMAL LOW (ref >90)
Glucose, Bld: 96 mg/dL (ref 70–99)
Potassium: 4.5 mEq/L (ref 3.7–5.3)
Sodium: 145 mEq/L (ref 137–147)

## 2013-04-21 MED ORDER — FUROSEMIDE 40 MG PO TABS
40.0000 mg | ORAL_TABLET | Freq: Every day | ORAL | Status: DC
  Filled 2013-04-21: qty 1

## 2013-04-21 NOTE — Progress Notes (Signed)
Patient discharged.  IV removed.  Telemetry box 20 removed, CCMT notified, telemetry box returned.  Patient belongings gathered.  Patient educated on discharge instructions, discharge medications, and follow-up appointments.  Patient instructed to stop taking ibuprofen and instead take Tylenol for pain.  Patient verbalized understanding.  No prescriptions needed.

## 2013-04-21 NOTE — Discharge Summary (Signed)
Physician Discharge Summary  Gerald Gomez MVE:[id number] DOB: 15-Oct-1940 DOA: 04/18/2013  PCP: Oliver Barre, MD  Admit date: 04/18/2013 Discharge date: 04/21/2013  Time spent: 35 minutes  Recommendations for Outpatient Follow-up:  1. Follow up with PCP in 1 week. 2. Cardiology on 2 weeks.  Discharge Diagnoses:  Principal Problem:   Renal failure (ARF), acute on chronic Active Problems:   HEPATITIS C   HYPERLIPIDEMIA   THROMBOCYTOPENIA   HYPERTENSION   CKD (chronic kidney disease) stage 3, GFR 30-59 ml/min   Chronic diastolic heart failure   Dehydration   Hyperkalemia   ARF (acute renal failure)   Discharge Condition: stable  Diet recommendation: low sodium   Filed Weights   04/18/13 2336 04/19/13 2025 04/20/13 2237  Weight: 94.2 kg (207 lb 10.8 oz) 96.219 kg (212 lb 2 oz) 96.662 kg (213 lb 1.6 oz)    History of present illness:  73 y.o. male with Multiple Medical Problems who presents to the ED with complaints of worsening weakness for the past 3 weeks and has had unsteadiness on his feet due to weakness in both of his legs. He reports falling today in his bathroom due to weakness, he denies having any syncope or chest pain. He has also had decreased intake of foods and liquids . He was discharged from the hospital on 04/03/2013 after a hospitalized for a CHF exacerbation and on 03/25/2013 for the same. In the ED, He was evaluated and was found to have an increase in his Bun/Cr Today at 76/2.28 and previously his creatinine had been 1.7. He was also found to have hyperkalemia at 5.6. He was given gentle IVFs, and 15 grams of Kayexalate x 1 and referred for medical admission   Hospital Course:  Renal failure (ARF), acute on CKD (chronic kidney disease) stage 3, GFR 30-59 ml/min:  - Baseline Cr. 1.7. CR on admission 3.0 started on NS,  Improved with IV fluids.  - held lasix and NSAID's. Avoid NSAID's at home. - due to lasix and NSAID's  Hyperkalemia:  - Due to AKI.  - Given  Kayexalate as it it did not improve with hydration   Hypernatremia:  - due to AKI. - resolved with IV fluids.  Chronic diastolic heart failure:  - watch hydration with fluid as he does have HF.  - Cont metoprolol resume lasix as an outpatient.  HYPERTENSION  - cont metoprolol.  - hold all other medications.   Procedures:  CXR  Consultations:  none  Discharge Exam: Filed Vitals:   04/21/13 0800  BP: 174/99  Pulse: 71  Temp: 98.8 F (37.1 C)  Resp: 18    General: A&O x3 Cardiovascular: RRR Respiratory: good air movement CTA B/L  Discharge Instructions  Discharge Orders   Future Appointments Provider Department Dept Phone   10/06/2013 10:30 AM Corwin Levins, MD [place] [facility] [phone]   Future Orders Complete By Expires   Diet - low sodium heart healthy  As directed    Increase activity slowly  As directed        Medication List    STOP taking these medications       ibuprofen 800 MG tablet  Commonly known as:  ADVIL,MOTRIN      TAKE these medications       albuterol 108 (90 BASE) MCG/ACT inhaler  Commonly known as:  PROVENTIL HFA;VENTOLIN HFA  Inhale 1-2 puffs into the lungs every 6 (six) hours as needed for wheezing or shortness of breath.  citalopram 10 MG tablet  Commonly known as:  CELEXA  Take 10 mg by mouth daily.     clorazepate 7.5 MG tablet  Commonly known as:  TRANXENE  Take 7.5 mg by mouth 3 (three) times daily as needed for anxiety.     cycloSPORINE 0.05 % ophthalmic emulsion  Commonly known as:  RESTASIS  Place 1 drop into both eyes 2 (two) times daily.     fluticasone 50 MCG/ACT nasal spray  Commonly known as:  FLONASE  Place 2 sprays into both nostrils daily.     furosemide 40 MG tablet  Commonly known as:  LASIX  Take 40 mg by mouth daily.     HYDROcodone-acetaminophen 5-325 MG per tablet  Commonly known as:  NORCO/VICODIN  Take 1 tablet by mouth every 6 (six) hours as needed for moderate  pain.     metoprolol 100 MG tablet  Commonly known as:  LOPRESSOR  Take 50 mg by mouth 2 (two) times daily.     potassium chloride 10 MEQ tablet  Commonly known as:  K-DUR  Take 10 mEq by mouth 2 (two) times daily.     tamsulosin 0.4 MG Caps capsule  Commonly known as:  FLOMAX  Take 0.4 mg by mouth daily.     TOVIAZ 4 MG Tb24 tablet  Generic drug:  fesoterodine  Take 4 mg by mouth daily.       Allergies  Allergen Reactions  . Tramadol Nausea Only       Follow-up Information   Follow up with [name], MD In 1 week. (hospital follow up)    Specialties:  Internal Medicine, Radiology   Contact information:   [place] [address] [phone]        The results of significant diagnostics from this hospitalization (including imaging, microbiology, ancillary and laboratory) are listed below for reference.    Significant Diagnostic Studies: Dg Chest 1 View  04/18/2013   CLINICAL DATA:  Fall.  EXAM: CHEST - 1 VIEW  COMPARISON:  03/25/2013.  FINDINGS: Mediastinum and hilar structures normal. Poor inspiration. Lungs are clear. No pleural effusion or pneumothorax. Heart size normal. Normal pulmonary vascularity. No acute bony abnormality.  IMPRESSION: No active disease.   Electronically Signed   By: Marcello Moores  Register   On: 04/18/2013 20:56   Dg Chest 2 View (if Patient Has Fever And/or Copd)  03/25/2013   CLINICAL DATA:  Shortness of breath and cough.  EXAM: CHEST  2 VIEW  COMPARISON:  02/07/2010  FINDINGS: Shallow inspiration with elevation of left hemidiaphragm. Heart size and pulmonary vascularity are normal. Lungs appear clear without focal infiltration or consolidation. No blunting of costophrenic angles. No significant change.  IMPRESSION: No evidence of active pulmonary disease.   Electronically Signed   By: Lucienne Capers M.D.   On: 03/25/2013 04:22   Dg Pelvis 1-2 Views  04/18/2013   CLINICAL DATA:  Fall.  EXAM: PELVIS - 1-2 VIEW  COMPARISON:   None.  FINDINGS: Exam demonstrates mild symmetric degenerative change of the hips. There is no acute fracture or dislocation. There are degenerative changes over the spot.  IMPRESSION: No acute findings.   Electronically Signed   By: Marin Olp M.D.   On: 04/18/2013 21:00   US Abdomen Complete  03/25/2013   CLINICAL DATA:  Hep C, thrombocytopenia  EXAM: ULTRASOUND ABDOMEN COMPLETE  COMPARISON:  12/02/2008  FINDINGS: Gallbladder:  Multiple small layering gallstones. No gallbladder wall thickening or pericholecystic fluid.  Negative sonographic Murphy's sign.  Common bile duct:  Diameter: 5 mm.  Liver:  No focal lesion identified. Within normal limits in parenchymal echogenicity.  IVC:  Poorly visualized.  Pancreas:  Poorly visualized due to overlying bowel gas.  Spleen:  Measures 7.9 cm.  Right Kidney:  Length: 10.4 cm. Echogenic renal parenchyma, suggesting medical renal disease. No hydronephrosis.  Left Kidney:  Length: 12.5 cm. Echogenic renal parenchyma, suggesting medical renal disease. Poorly visualized. No hydronephrosis.  Abdominal aorta:  No aneurysm visualized.  Other findings:  None.  IMPRESSION: Cholelithiasis, without associated sonographic findings to suggest acute cholecystitis.  Echogenic renal parenchyma, suggesting medical renal disease.   Electronically Signed   By: Julian Hy M.D.   On: 03/25/2013 16:28    Microbiology: No results found for this or any previous visit (from the past 240 hour(s)).   Labs: Basic Metabolic Panel:  Recent Labs Lab 04/18/13 1932 04/19/13 0500 04/20/13 0450 04/21/13 0702  NA 145 146 150* 145  K 5.6* 5.7* 4.9 4.5  CL 111 115* 115* 111  CO2 20 19 20 19   GLUCOSE 148* 100* 92 96  BUN 76* 74* 65* 55*  CREATININE 3.28* 3.06* 2.50* 2.20*  CALCIUM 9.5 9.0 8.8 9.2   Liver Function Tests:  Recent Labs Lab 04/18/13 1932  AST 42*  ALT 37  ALKPHOS 94  BILITOT 0.2*  PROT 8.5*  ALBUMIN 3.2*   No results found for this basename: LIPASE,  AMYLASE,  in the last 168 hours No results found for this basename: AMMONIA,  in the last 168 hours CBC:  Recent Labs Lab 04/18/13 1932 04/19/13 0500  WBC 4.7 6.4  NEUTROABS 2.4  --   HGB 11.0* 10.2*  HCT 33.6* 32.1*  MCV 87.0 87.9  PLT 95* 75*   Cardiac Enzymes: No results found for this basename: CKTOTAL, CKMB, CKMBINDEX, TROPONINI,  in the last 168 hours BNP: BNP (last 3 results)  Recent Labs  03/25/13 0434 04/18/13 1932  PROBNP 7935.0* 383.0*   CBG:  Recent Labs Lab 04/18/13 1917  GLUCAP 151*       Signed:  Charlynne Cousins  [facility] 04/21/2013, 11:16 AM

## 2013-04-22 ENCOUNTER — Other Ambulatory Visit: Payer: Self-pay | Admitting: Internal Medicine

## 2013-04-28 ENCOUNTER — Ambulatory Visit (INDEPENDENT_AMBULATORY_CARE_PROVIDER_SITE_OTHER): Payer: Medicare Other | Admitting: Internal Medicine

## 2013-04-28 ENCOUNTER — Encounter: Payer: Self-pay | Admitting: Internal Medicine

## 2013-04-28 ENCOUNTER — Other Ambulatory Visit (INDEPENDENT_AMBULATORY_CARE_PROVIDER_SITE_OTHER): Payer: Medicare Other

## 2013-04-28 VITALS — BP 112/84 | HR 81 | Temp 98.5°F | Wt 212.2 lb

## 2013-04-28 DIAGNOSIS — I10 Essential (primary) hypertension: Secondary | ICD-10-CM

## 2013-04-28 DIAGNOSIS — N179 Acute kidney failure, unspecified: Secondary | ICD-10-CM

## 2013-04-28 DIAGNOSIS — E875 Hyperkalemia: Secondary | ICD-10-CM

## 2013-04-28 DIAGNOSIS — I5032 Chronic diastolic (congestive) heart failure: Secondary | ICD-10-CM

## 2013-04-28 DIAGNOSIS — E860 Dehydration: Secondary | ICD-10-CM

## 2013-04-28 LAB — BASIC METABOLIC PANEL
BUN: 37 mg/dL — ABNORMAL HIGH (ref 6–23)
CO2: 23 mEq/L (ref 19–32)
CREATININE: 2.2 mg/dL — AB (ref 0.4–1.5)
Calcium: 9.3 mg/dL (ref 8.4–10.5)
Chloride: 108 mEq/L (ref 96–112)
GFR: 37.84 mL/min — AB (ref >60.00)
GLUCOSE: 109 mg/dL — AB (ref 70–99)
Potassium: 4.2 mEq/L (ref 3.5–5.1)
SODIUM: 138 meq/L (ref 135–145)

## 2013-04-28 MED ORDER — SILDENAFIL CITRATE 100 MG PO TABS: 50.0000 mg | ORAL_TABLET | Freq: Every day | ORAL | Status: DC | PRN

## 2013-04-28 NOTE — Patient Instructions (Signed)
Please continue all other medications as before Please continue to follow your weight daily at home, and call for weight increased more than 5 lbs, or the start of swelling to your legs  Please go to the LAB in the Basement (turn left off the elevator) for the tests to be done today You will be contacted by phone if any changes need to be made immediately.  Otherwise, you will receive a letter about your results with an explanation, but please check with MyChart first.  We will try to forward the results to your kidney doctor as well  Please return in 3 months, or sooner if needed

## 2013-04-28 NOTE — Assessment & Plan Note (Signed)
For f/u lab today off ace and nsaid

## 2013-04-28 NOTE — Progress Notes (Signed)
Subjective:    Patient ID: Gerald Gomez, male    DOB: Mar 14, 1941, 73 y.o.   MRN: [id number]  HPI  Here to f/u after recent brief hospn with episode of n/v, weakness fall and AKI with cr over 3, then better to 2.2 with gentle IVF's at d/c.  Lisinopril and ibuprofen also stopped at d/c.  Has been in contact with renal office, but no appt that he or wife know of.  Also mentioned to see card at 2 wks but neither pt or wife was aware of this.  This is all after recent prior hospn with diast chf exacerbation, now on chronic lasix, no change with more recent hospn as above.  Pt feels overall impoved. Wt up a couple of lbs since last d/c on current meds, statees good compliance. Asks for viagra refill. Past Medical History  Diagnosis Date  . ALLERGIC RHINITIS 03/28/2009  . ANXIETY 03/28/2009  . CHOLELITHIASIS 03/28/2009  . DEGENERATIVE JOINT DISEASE 03/28/2009  . DEPRESSION 03/28/2009  . [place] DISEASE, LUMBAR 03/28/2009  . DIVERTICULOSIS, COLON 03/28/2009  . 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 FATIGUE 03/28/2009  . HEPATITIS C 03/28/2009  . HYPERLIPIDEMIA 03/28/2009  . HYPERTENSION 03/28/2009  . LOW BACK PAIN, CHRONIC 03/28/2009  . PERIPHERAL VASCULAR DISEASE 03/28/2009  . Personal history of alcoholism 03/28/2009  . RENAL INSUFFICIENCY 03/28/2009  . THROMBOCYTOPENIA 03/28/2009  . Wheezing 02/07/2010  . Gallstones   . CHF (congestive heart failure)    Past Surgical History  Procedure Laterality Date  . Right shoulder surgery  2003    reports that he quit smoking about 18 years ago. His smoking use included Cigarettes. He smoked 0.25 packs per day. He does not have any smokeless tobacco history on file. He reports that he does not drink alcohol or use illicit drugs. family history includes Breast cancer in his daughter; Lung cancer in his father; Stroke in his brother. Allergies  Allergen Reactions  . Tramadol Nausea Only   Current Outpatient Prescriptions on File Prior to Visit  Medication Sig Dispense  Refill  . albuterol (PROVENTIL HFA;VENTOLIN HFA) 108 (90 BASE) MCG/ACT inhaler Inhale 1-2 puffs into the lungs every 6 (six) hours as needed for wheezing or shortness of breath.      . citalopram (CELEXA) 10 MG tablet take 1 tablet by mouth once daily  90 tablet  3  . clorazepate (TRANXENE) 7.5 MG tablet Take 7.5 mg by mouth 3 (three) times daily as needed for anxiety.      . cycloSPORINE (RESTASIS) 0.05 % ophthalmic emulsion Place 1 drop into both eyes 2 (two) times daily.      . fesoterodine (TOVIAZ) 4 MG TB24 Take 4 mg by mouth daily.      . fluticasone (FLONASE) 50 MCG/ACT nasal spray Place 2 sprays into both nostrils daily.      . furosemide (LASIX) 40 MG tablet Take 40 mg by mouth daily.      Marland Kitchen HYDROcodone-acetaminophen (NORCO/VICODIN) 5-325 MG per tablet Take 1 tablet by mouth every 6 (six) hours as needed for moderate pain.      . metoprolol (LOPRESSOR) 100 MG tablet Take 50 mg by mouth 2 (two) times daily.      . potassium chloride (K-DUR) 10 MEQ tablet Take 10 mEq by mouth 2 (two) times daily.      . tamsulosin (FLOMAX) 0.4 MG CAPS capsule Take 0.4 mg by mouth daily.       No current facility-administered medications on file prior to  visit.   Review of Systems  Constitutional: Negative for unexpected weight change, or unusual diaphoresis  HENT: Negative for tinnitus.   Eyes: Negative for photophobia and visual disturbance.  Respiratory: Negative for choking and stridor.   Gastrointestinal: Negative for vomiting and blood in stool.  Genitourinary: Negative for hematuria and decreased urine volume.  Musculoskeletal: Negative for acute joint swelling Skin: Negative for color change and wound.  Neurological: Negative for tremors and numbness other than noted  Psychiatric/Behavioral: Negative for decreased concentration or  hyperactivity.  ;    Objective:   Physical Exam BP 112/84  Pulse 81  Temp(Src) 98.5 F (36.9 C) (Oral)  Wt 212 lb 4 oz (96.276 kg)  SpO2 95% VS noted, not  ill appearing, walks slowly with cane Constitutional: Pt appears well-developed and well-nourished.  HENT: Head: NCAT.  Right Ear: External ear normal.  Left Ear: External ear normal.  Eyes: Conjunctivae and EOM are normal. Pupils are equal, round, and reactive to light.  Neck: Normal range of motion. Neck supple.  Cardiovascular: Normal rate and regular rhythm.   Pulmonary/Chest: Effort normal and breath sounds -without rales or wheezing Abd:  Soft, NT, non-distended, + BS Neurological: Pt is alert. Not confused  Skin: Skin is warm. No erythema. No LE edema Psychiatric: Pt behavior is normal. Thought content normal.     Assessment & Plan:

## 2013-04-28 NOTE — Progress Notes (Signed)
Pre-visit discussion using our clinic review tool. No additional management support is needed unless otherwise documented below in the visit note.  

## 2013-04-28 NOTE — Assessment & Plan Note (Addendum)
For f/u lab today, will try to forward results to renal

## 2013-04-28 NOTE — Assessment & Plan Note (Addendum)
Stable off ACEI, cont current meds

## 2013-04-28 NOTE — Assessment & Plan Note (Signed)
Appears euvolemic currently, suspect only 1-2 lbs increase since last d/c wt, ok to cont meds as is

## 2013-04-28 NOTE — Assessment & Plan Note (Signed)
Resolved,  to f/u any worsening symptoms or concerns  

## 2013-04-30 ENCOUNTER — Other Ambulatory Visit: Payer: Self-pay

## 2013-04-30 MED ORDER — POTASSIUM CHLORIDE ER 10 MEQ PO TBCR: 10.0000 meq | EXTENDED_RELEASE_TABLET | Freq: Two times a day (BID) | ORAL | Status: DC

## 2013-05-27 ENCOUNTER — Telehealth: Payer: Self-pay | Admitting: Internal Medicine

## 2013-05-27 MED ORDER — FUROSEMIDE 40 MG PO TABS: 40.0000 mg | ORAL_TABLET | Freq: Every day | ORAL | Status: DC

## 2013-05-27 NOTE — Telephone Encounter (Signed)
Done erx 

## 2013-05-27 NOTE — Telephone Encounter (Signed)
Pt request refill for Furosemide 40 mg. Please advise pt is out of this med

## 2013-07-01 ENCOUNTER — Other Ambulatory Visit: Payer: Self-pay | Admitting: Internal Medicine

## 2013-07-27 ENCOUNTER — Other Ambulatory Visit: Payer: Self-pay | Admitting: Internal Medicine

## 2013-07-28 ENCOUNTER — Encounter: Payer: Self-pay | Admitting: Internal Medicine

## 2013-07-28 ENCOUNTER — Ambulatory Visit (INDEPENDENT_AMBULATORY_CARE_PROVIDER_SITE_OTHER): Payer: Medicare Other | Admitting: Internal Medicine

## 2013-07-28 ENCOUNTER — Other Ambulatory Visit (INDEPENDENT_AMBULATORY_CARE_PROVIDER_SITE_OTHER): Payer: Medicare Other

## 2013-07-28 VITALS — BP 112/70 | HR 82 | Temp 98.6°F | Wt 211.8 lb

## 2013-07-28 DIAGNOSIS — Z9181 History of falling: Secondary | ICD-10-CM

## 2013-07-28 DIAGNOSIS — R5383 Other fatigue: Secondary | ICD-10-CM

## 2013-07-28 DIAGNOSIS — M545 Low back pain, unspecified: Secondary | ICD-10-CM

## 2013-07-28 DIAGNOSIS — I951 Orthostatic hypotension: Secondary | ICD-10-CM | POA: Insufficient documentation

## 2013-07-28 DIAGNOSIS — I10 Essential (primary) hypertension: Secondary | ICD-10-CM

## 2013-07-28 DIAGNOSIS — R5381 Other malaise: Secondary | ICD-10-CM

## 2013-07-28 DIAGNOSIS — R296 Repeated falls: Secondary | ICD-10-CM

## 2013-07-28 LAB — CBC WITH DIFFERENTIAL/PLATELET
BASOS ABS: 0 10*3/uL (ref 0.0–0.1)
Basophils Relative: 0.5 % (ref 0.0–3.0)
EOS ABS: 0.1 10*3/uL (ref 0.0–0.7)
Eosinophils Relative: 1.3 % (ref 0.0–5.0)
HCT: 33.5 % — ABNORMAL LOW (ref 39.0–52.0)
Hemoglobin: 10.9 g/dL — ABNORMAL LOW (ref 13.0–17.0)
LYMPHS ABS: 2.5 10*3/uL (ref 0.7–4.0)
LYMPHS PCT: 35.6 % (ref 12.0–46.0)
MCHC: 32.6 g/dL (ref 30.0–36.0)
MCV: 89 fl (ref 78.0–100.0)
Monocytes Absolute: 1.1 10*3/uL — ABNORMAL HIGH (ref 0.1–1.0)
Monocytes Relative: 15 % — ABNORMAL HIGH (ref 3.0–12.0)
NEUTROS PCT: 47.6 % (ref 43.0–77.0)
Neutro Abs: 3.4 10*3/uL (ref 1.4–7.7)
Platelets: 100 10*3/uL — ABNORMAL LOW (ref 150.0–400.0)
RBC: 3.77 Mil/uL — ABNORMAL LOW (ref 4.22–5.81)
RDW: 14.9 % (ref 11.5–15.5)
WBC: 7 10*3/uL (ref 4.0–10.5)

## 2013-07-28 LAB — BASIC METABOLIC PANEL
BUN: 51 mg/dL — ABNORMAL HIGH (ref 6–23)
CHLORIDE: 110 meq/L (ref 96–112)
CO2: 22 mEq/L (ref 19–32)
CREATININE: 2.3 mg/dL — AB (ref 0.4–1.5)
Calcium: 9.4 mg/dL (ref 8.4–10.5)
GFR: 36.28 mL/min — AB (ref >60.00)
Glucose, Bld: 101 mg/dL — ABNORMAL HIGH (ref 70–99)
Potassium: 4.8 mEq/L (ref 3.5–5.1)
Sodium: 139 mEq/L (ref 135–145)

## 2013-07-28 MED ORDER — METOPROLOL TARTRATE 25 MG PO TABS: 25.0000 mg | ORAL_TABLET | Freq: Two times a day (BID) | ORAL | Status: DC

## 2013-07-28 MED ORDER — TRAMADOL HCL 50 MG PO TABS: 50.0000 mg | ORAL_TABLET | Freq: Three times a day (TID) | ORAL | Status: DC | PRN

## 2013-07-28 NOTE — Assessment & Plan Note (Addendum)
Suspect possibly volume related, to hold lasix for 1 wk, also reduce metoprolol to 25 mg twice per day, for labs today, and f/u renal as planned  Note:  Total time for pt hx, exam, review of record with pt in the room, determination of diagnoses and plan for further eval and tx is > 40 min, with over 50% spent in coordination and counseling of patient

## 2013-07-28 NOTE — Progress Notes (Signed)
Pre visit review using our clinic review tool, if applicable. No additional management support is needed unless otherwise documented below in the visit note. 

## 2013-07-28 NOTE — Progress Notes (Signed)
Subjective:    Patient ID: Gerald Gomez, male    DOB: 1941-01-14, 73 y.o.   MRN: [id number]  HPI Here to f/u, Pt continues to have recurring LBP without change in severity, bowel or bladder change, fever, wt loss,  worsening LE pain/numbness.  Does have worsening general weakness.  Has f/u appt with Dr [name] may 15.  C/o general weakness, and occas dizziness, fell x 1 last wk he thinks due to dizziness and weakness but has some memory difficulty.  Has cane and walker at home. Fallen total of 3 times in past 6 mo, no injury so far, sent to ER feb 3 with neg xrays but hospn for AKI on CKD - ACEI held, and nsaid stopped. Baseline cr approx 2.2.  Does not normally check Bp at home, BP lower today.  Most recent echo with EF 55-6% dec 2014 with gr 2 diast dysfxn. C/o chronic pain, asks for hydrocodone replacement since I no longer prescribe for chronic pain, has been referred to pain management Past Medical History  Diagnosis Date  . ALLERGIC RHINITIS 03/28/2009  . ANXIETY 03/28/2009  . CHOLELITHIASIS 03/28/2009  . DEGENERATIVE JOINT DISEASE 03/28/2009  . DEPRESSION 03/28/2009  . [place] DISEASE, LUMBAR 03/28/2009  . DIVERTICULOSIS, COLON 03/28/2009  . 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 FATIGUE 03/28/2009  . HEPATITIS C 03/28/2009  . HYPERLIPIDEMIA 03/28/2009  . HYPERTENSION 03/28/2009  . LOW BACK PAIN, CHRONIC 03/28/2009  . PERIPHERAL VASCULAR DISEASE 03/28/2009  . Personal history of alcoholism 03/28/2009  . RENAL INSUFFICIENCY 03/28/2009  . THROMBOCYTOPENIA 03/28/2009  . Wheezing 02/07/2010  . Gallstones   . CHF (congestive heart failure)    Past Surgical History  Procedure Laterality Date  . Right shoulder surgery  2003    reports that he quit smoking about 18 years ago. His smoking use included Cigarettes. He smoked 0.25 packs per day. He does not have any smokeless tobacco history on file. He reports that he does not drink alcohol or use illicit drugs. family history includes Breast cancer in his  daughter; Lung cancer in his father; Stroke in his brother. Allergies  Allergen Reactions  . Tramadol Nausea Only   Current Outpatient Prescriptions on File Prior to Visit  Medication Sig Dispense Refill  . albuterol (PROVENTIL HFA;VENTOLIN HFA) 108 (90 BASE) MCG/ACT inhaler Inhale 1-2 puffs into the lungs every 6 (six) hours as needed for wheezing or shortness of breath.      . citalopram (CELEXA) 10 MG tablet take 1 tablet by mouth once daily  90 tablet  3  . clorazepate (TRANXENE) 7.5 MG tablet Take 7.5 mg by mouth 3 (three) times daily as needed for anxiety.      . cycloSPORINE (RESTASIS) 0.05 % ophthalmic emulsion Place 1 drop into both eyes 2 (two) times daily.      . fesoterodine (TOVIAZ) 4 MG TB24 Take 4 mg by mouth daily.      . fluticasone (FLONASE) 50 MCG/ACT nasal spray Place 2 sprays into both nostrils daily.      . fluticasone (FLONASE) 50 MCG/ACT nasal spray place 2 sprays into each nostril once daily  16 g  11  . furosemide (LASIX) 40 MG tablet Take 1 tablet (40 mg total) by mouth daily.  90 tablet  3  . HYDROcodone-acetaminophen (NORCO/VICODIN) 5-325 MG per tablet Take 1 tablet by mouth every 6 (six) hours as needed for moderate pain.      . metoprolol (LOPRESSOR) 100 MG tablet take 1/2 tablet by  mouth twice a day  90 tablet  3  . potassium chloride (K-DUR) 10 MEQ tablet Take 1 tablet (10 mEq total) by mouth 2 (two) times daily.  60 tablet  11  . sildenafil (VIAGRA) 100 MG tablet Take 0.5-1 tablets (50-100 mg total) by mouth daily as needed for erectile dysfunction.  7 tablet  11  . tamsulosin (FLOMAX) 0.4 MG CAPS capsule Take 0.4 mg by mouth daily.       No current facility-administered medications on file prior to visit.   Review of Systems  Constitutional: Negative for unusual diaphoresis or other sweats  HENT: Negative for ringing in ear Eyes: Negative for double vision or worsening visual disturbance.  Respiratory: Negative for choking and stridor.     Gastrointestinal: Negative for vomiting or other signifcant bowel change Genitourinary: Negative for hematuria or decreased urine volume.  Musculoskeletal: Negative for other MSK pain or swelling Skin: Negative for color change and worsening wound.  Neurological: Negative for tremors and numbness other than noted  Psychiatric/Behavioral: Negative for decreased concentration or agitation other than above       Objective:   Physical Exam BP 104/64  Pulse 82  Temp(Src) 98.6 F (37 C) (Oral)  Wt 211 lb 12 oz (96.049 kg)  SpO2 96% VS noted, Orthostatics reviewed Constitutional: Pt appears well-developed, well-nourished.  HENT: Head: NCAT.  Right Ear: External ear normal.  Left Ear: External ear normal.  Eyes: . Pupils are equal, round, and reactive to light. Conjunctivae and EOM are normal Neck: Normal range of motion. Neck supple.  Mouth not overly dry appearing Cardiovascular: Normal rate and regular rhythm.   Pulmonary/Chest: Effort normal and breath sounds normal.  Abd:  Soft, NT, ND, + BS Neurological: Pt is alert. Somewhat confused- appears at baseline , motor grossly intact but very slow movements, though is able to get up on exam table with small assist only Skin: Skin is warm. No rash Psychiatric: Pt behavior is normal. No agitation.     Assessment & Plan:

## 2013-07-28 NOTE — Assessment & Plan Note (Signed)
O/w lower today, to reduce beta blocker as above, recent data reviewed with pt,,  to f/u any worsening symptoms or concerns

## 2013-07-28 NOTE — Assessment & Plan Note (Signed)
Also for outpatient PT eval

## 2013-07-28 NOTE — Assessment & Plan Note (Signed)
With gait slowness, recent falls - also for cbc

## 2013-07-28 NOTE — Patient Instructions (Addendum)
Your Blood Pressure drops on standing today from 138 to 102  We should hold taking the lasix (AND potassium) for 1 wk, as well as decrease the metoprolol to 25 mg twice per day  You will be contacted regarding the referral for: Physical Therapy  Please keep your appointments with your specialists as you have planned - Dr [name] on may 15 as planned  Please go to the LAB in the Basement (turn left off the elevator) for the tests to be done today  You will be contacted by phone if any changes need to be made immediately.  Otherwise, you will receive a letter about your results with an explanation, but please check with MyChart first.  Please return in 4 months, or sooner if needed

## 2013-07-28 NOTE — Assessment & Plan Note (Addendum)
For pain clinic f/u; allergic to tramadol

## 2013-07-29 ENCOUNTER — Telehealth: Payer: Self-pay | Admitting: Internal Medicine

## 2013-07-29 NOTE — Telephone Encounter (Signed)
Relevant patient education mailed to patient.  

## 2013-08-02 ENCOUNTER — Emergency Department (HOSPITAL_COMMUNITY): Payer: Medicare Other

## 2013-08-02 ENCOUNTER — Emergency Department (HOSPITAL_COMMUNITY)
Admission: EM | Admit: 2013-08-02 | Discharge: 2013-08-02 | Disposition: A | Discharge status: Home / Self Care | Payer: Medicare Other | Attending: Emergency Medicine | Admitting: Emergency Medicine

## 2013-08-02 ENCOUNTER — Encounter (HOSPITAL_COMMUNITY): Payer: Self-pay | Admitting: Emergency Medicine

## 2013-08-02 DIAGNOSIS — Z8619 Personal history of other infectious and parasitic diseases: Secondary | ICD-10-CM | POA: Insufficient documentation

## 2013-08-02 DIAGNOSIS — G8929 Other chronic pain: Secondary | ICD-10-CM | POA: Insufficient documentation

## 2013-08-02 DIAGNOSIS — F3289 Other specified depressive episodes: Secondary | ICD-10-CM | POA: Insufficient documentation

## 2013-08-02 DIAGNOSIS — M47816 Spondylosis without myelopathy or radiculopathy, lumbar region: Secondary | ICD-10-CM

## 2013-08-02 DIAGNOSIS — Z862 Personal history of diseases of the blood and blood-forming organs and certain disorders involving the immune mechanism: Secondary | ICD-10-CM | POA: Insufficient documentation

## 2013-08-02 DIAGNOSIS — F1021 Alcohol dependence, in remission: Secondary | ICD-10-CM | POA: Insufficient documentation

## 2013-08-02 DIAGNOSIS — Z8709 Personal history of other diseases of the respiratory system: Secondary | ICD-10-CM | POA: Insufficient documentation

## 2013-08-02 DIAGNOSIS — IMO0002 Reserved for concepts with insufficient information to code with codable children: Secondary | ICD-10-CM | POA: Insufficient documentation

## 2013-08-02 DIAGNOSIS — F329 Major depressive disorder, single episode, unspecified: Secondary | ICD-10-CM | POA: Insufficient documentation

## 2013-08-02 DIAGNOSIS — Z8639 Personal history of other endocrine, nutritional and metabolic disease: Secondary | ICD-10-CM | POA: Insufficient documentation

## 2013-08-02 DIAGNOSIS — Z87448 Personal history of other diseases of urinary system: Secondary | ICD-10-CM | POA: Insufficient documentation

## 2013-08-02 DIAGNOSIS — Z79899 Other long term (current) drug therapy: Secondary | ICD-10-CM | POA: Insufficient documentation

## 2013-08-02 DIAGNOSIS — I509 Heart failure, unspecified: Secondary | ICD-10-CM | POA: Insufficient documentation

## 2013-08-02 DIAGNOSIS — Z8719 Personal history of other diseases of the digestive system: Secondary | ICD-10-CM | POA: Insufficient documentation

## 2013-08-02 DIAGNOSIS — I10 Essential (primary) hypertension: Secondary | ICD-10-CM | POA: Insufficient documentation

## 2013-08-02 DIAGNOSIS — F411 Generalized anxiety disorder: Secondary | ICD-10-CM | POA: Insufficient documentation

## 2013-08-02 DIAGNOSIS — M549 Dorsalgia, unspecified: Secondary | ICD-10-CM

## 2013-08-02 DIAGNOSIS — Z87891 Personal history of nicotine dependence: Secondary | ICD-10-CM | POA: Insufficient documentation

## 2013-08-02 DIAGNOSIS — M479 Spondylosis, unspecified: Secondary | ICD-10-CM | POA: Insufficient documentation

## 2013-08-02 MED ORDER — PREDNISONE 10 MG PO TABS: 20.0000 mg | ORAL_TABLET | Freq: Two times a day (BID) | ORAL | Status: DC

## 2013-08-02 MED ORDER — PREDNISONE 20 MG PO TABS
60.0000 mg | ORAL_TABLET | Freq: Once | ORAL | Status: AC
  Administered 2013-08-02: 60 mg via ORAL
  Filled 2013-08-02: qty 3

## 2013-08-02 MED ORDER — HYDROCODONE-ACETAMINOPHEN 5-325 MG PO TABS: 2.0000 | ORAL_TABLET | ORAL | Status: DC | PRN

## 2013-08-02 NOTE — Discharge Instructions (Signed)
Back Pain, Adult Low back pain is very common. About 1 in 5 people have back pain.The cause of low back pain is rarely dangerous. The pain often gets better over time.About half of people with a sudden onset of back pain feel better in just 2 weeks. About 8 in 10 people feel better by 6 weeks.  CAUSES Some common causes of back pain include:  Strain of the muscles or ligaments supporting the spine.  Wear and tear (degeneration) of the spinal discs.  Arthritis.  Direct injury to the back. DIAGNOSIS Most of the time, the direct cause of low back pain is not known.However, back pain can be treated effectively even when the exact cause of the pain is unknown.Answering your caregiver's questions about your overall health and symptoms is one of the most accurate ways to make sure the cause of your pain is not dangerous. If your caregiver needs more information, he or she may order lab work or imaging tests (X-rays or MRIs).However, even if imaging tests show changes in your back, this usually does not require surgery. HOME CARE INSTRUCTIONS For many people, back pain returns.Since low back pain is rarely dangerous, it is often a condition that people can learn to manageon their own.   Remain active. It is stressful on the back to sit or stand in one place. Do not sit, drive, or stand in one place for more than 30 minutes at a time. Take short walks on level surfaces as soon as pain allows.Try to increase the length of time you walk each day.  Do not stay in bed.Resting more than 1 or 2 days can delay your recovery.  Do not avoid exercise or work.Your body is made to move.It is not dangerous to be active, even though your back may hurt.Your back will likely heal faster if you return to being active before your pain is gone.  Pay attention to your body when you bend and lift. Many people have less discomfortwhen lifting if they bend their knees, keep the load close to their bodies,and  avoid twisting. Often, the most comfortable positions are those that put less stress on your recovering back.  Find a comfortable position to sleep. Use a firm mattress and lie on your side with your knees slightly bent. If you lie on your back, put a pillow under your knees.  Only take over-the-counter or prescription medicines as directed by your caregiver. Over-the-counter medicines to reduce pain and inflammation are often the most helpful.Your caregiver may prescribe muscle relaxant drugs.These medicines help dull your pain so you can more quickly return to your normal activities and healthy exercise.  Put ice on the injured area.  Put ice in a plastic bag.  Place a towel between your skin and the bag.  Leave the ice on for 15-20 minutes, 03-04 times a day for the first 2 to 3 days. After that, ice and heat may be alternated to reduce pain and spasms.  Ask your caregiver about trying back exercises and gentle massage. This may be of some benefit.  Avoid feeling anxious or stressed.Stress increases muscle tension and can worsen back pain.It is important to recognize when you are anxious or stressed and learn ways to manage it.Exercise is a great option. SEEK MEDICAL CARE IF:  You have pain that is not relieved with rest or medicine.  You have pain that does not improve in 1 week.  You have new symptoms.  You are generally not feeling well. SEEK   IMMEDIATE MEDICAL CARE IF:   You have pain that radiates from your back into your legs.  You develop new bowel or bladder control problems.  You have unusual weakness or numbness in your arms or legs.  You develop nausea or vomiting.  You develop abdominal pain.  You feel faint. Document Released: 03/12/2005 Document Revised: 09/11/2011 Document Reviewed: 07/31/2010 [employer] Patient Information 2014 [employer].  

## 2013-08-02 NOTE — ED Provider Notes (Signed)
CSN: [id number]     Arrival date & time 08/02/13  0511 History   First MD Initiated Contact with Patient 08/02/13 0600     Chief Complaint  Patient presents with  . Back Pain     (Consider location/radiation/quality/duration/timing/severity/associated sxs/prior Treatment) Patient is a 73 y.o. male presenting with back pain. The history is provided by the patient.  Back Pain  He complains of back pain for 2 weeks. It is gradually worse. This morning. The pain caused him to go to his knees, and he was unable to get up. He sees his doctor regularly for medical treatment and has received narcotic pain relievers from him, but the PCP is reluctant to give long-term narcotic medications. He has not had spinal imaging in several years. He denies recent fall. He denies bowel urinary incontinence. No fever, nausea, vomiting, focal weakness, or paresthesia. He was treated by EMS with fentanyl on transport here today, with relief of his pain. There are no other known modifying factors.  Past Medical History  Diagnosis Date  . ALLERGIC RHINITIS 03/28/2009  . ANXIETY 03/28/2009  . CHOLELITHIASIS 03/28/2009  . DEGENERATIVE JOINT DISEASE 03/28/2009  . DEPRESSION 03/28/2009  . [place] DISEASE, LUMBAR 03/28/2009  . DIVERTICULOSIS, COLON 03/28/2009  . 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 FATIGUE 03/28/2009  . HEPATITIS C 03/28/2009  . HYPERLIPIDEMIA 03/28/2009  . HYPERTENSION 03/28/2009  . LOW BACK PAIN, CHRONIC 03/28/2009  . PERIPHERAL VASCULAR DISEASE 03/28/2009  . Personal history of alcoholism 03/28/2009  . RENAL INSUFFICIENCY 03/28/2009  . THROMBOCYTOPENIA 03/28/2009  . Wheezing 02/07/2010  . Gallstones   . CHF (congestive heart failure)    Past Surgical History  Procedure Laterality Date  . Right shoulder surgery  2003   Family History  Problem Relation Age of Onset  . Lung cancer Father   . Stroke Brother   . Breast cancer Daughter    History  Substance Use Topics  . Smoking status: Former Smoker -- 0.25  packs/day    Types: Cigarettes    Quit date: 03/27/1995  . Smokeless tobacco: Not on file  . Alcohol Use: No    Review of Systems  Musculoskeletal: Positive for back pain.  All other systems reviewed and are negative.     Allergies  Tramadol  Home Medications   Prior to Admission medications   Medication Sig Start Date End Date Taking? Authorizing Provider  albuterol (PROVENTIL HFA;VENTOLIN HFA) 108 (90 BASE) MCG/ACT inhaler Inhale 1-2 puffs into the lungs every 6 (six) hours as needed for wheezing or shortness of breath.    Historical Provider, MD  citalopram (CELEXA) 10 MG tablet take 1 tablet by mouth once daily 04/22/13   [name], MD  clorazepate (TRANXENE) 7.5 MG tablet Take 7.5 mg by mouth 3 (three) times daily as needed for anxiety.    Historical Provider, MD  cycloSPORINE (RESTASIS) 0.05 % ophthalmic emulsion Place 1 drop into both eyes 2 (two) times daily.    Historical Provider, MD  fesoterodine (TOVIAZ) 4 MG TB24 Take 4 mg by mouth daily.    Historical Provider, MD  fluticasone (FLONASE) 50 MCG/ACT nasal spray Place 2 sprays into both nostrils daily.    Historical Provider, MD  furosemide (LASIX) 40 MG tablet Take 1 tablet (40 mg total) by mouth daily. 05/27/13   [name], MD  metoprolol tartrate (LOPRESSOR) 25 MG tablet Take 1 tablet (25 mg total) by mouth 2 (two) times daily. 07/28/13   [name], MD  potassium  chloride (K-DUR) 10 MEQ tablet Take 1 tablet (10 mEq total) by mouth 2 (two) times daily. 04/30/13   [name], MD  sildenafil (VIAGRA) 100 MG tablet Take 0.5-1 tablets (50-100 mg total) by mouth daily as needed for erectile dysfunction. 04/28/13   [name], MD  tamsulosin (FLOMAX) 0.4 MG CAPS capsule Take 0.4 mg by mouth daily.    Historical Provider, MD  traMADol (ULTRAM) 50 MG tablet Take 1 tablet (50 mg total) by mouth every 8 (eight) hours as needed. 07/28/13   [name], MD   BP 163/92  Pulse 73  Temp(Src) 98.4 F (36.9 C) (Oral)  Resp 18   SpO2 100% Physical Exam  Nursing note and vitals reviewed. Constitutional: He is oriented to person, place, and time. He appears well-developed and well-nourished.  HENT:  Head: Normocephalic and atraumatic.  Right Ear: External ear normal.  Left Ear: External ear normal.  Eyes: Conjunctivae and EOM are normal. Pupils are equal, round, and reactive to light.  Neck: Normal range of motion and phonation normal. Neck supple.  Cardiovascular: Normal rate, regular rhythm, normal heart sounds and intact distal pulses.   Pulmonary/Chest: Effort normal and breath sounds normal. He exhibits no bony tenderness.  Abdominal: Soft. There is no tenderness.  Musculoskeletal: Normal range of motion.  Back is nontender to palpation. He is able to move from supine to decubitus position, in the bed, without difficulty or evident pain.  Neurological: He is alert and oriented to person, place, and time. No cranial nerve deficit or sensory deficit. He exhibits normal muscle tone. Coordination normal.  Skin: Skin is warm, dry and intact.  Psychiatric: He has a normal mood and affect. His behavior is normal. Judgment and thought content normal.    ED Course  Procedures (including critical care time)  Medications  predniSONE (DELTASONE) tablet 60 mg (60 mg Oral Given 08/02/13 0715)      Labs Review Labs Reviewed - No data to display  Imaging Review No results found.   EKG Interpretation   Date/Time:  Sunday Aug 02 2013 05:27:39 EDT Ventricular Rate:  80 PR Interval:  188 QRS Duration: 74 QT Interval:  359 QTC Calculation: 414 R Axis:   57 Text Interpretation:  Sinus rhythm Abnormal T, consider ischemia, lateral  leads When compared with ECG of 04/18/2013, No significant change was found  Confirmed by [facility]  MD, DAVID ([id number]) on 08/02/2013 6:57:07 AM      MDM   Final diagnoses:  Back pain  DJD (degenerative joint disease), lumbar    Ongoing chronic pain with use of narcotics. There is no  indication for acute fracture, myelopathy, or significant nerve impingement.  Nursing Notes Reviewed/ Care Coordinated Applicable Imaging Reviewed Interpretation of Laboratory Data incorporated into ED treatment  The patient appears reasonably screened and/or stabilized for discharge and I doubt any other medical condition or other [facility] requiring further screening, evaluation, or treatment in the ED at this time prior to discharge.  Plan: Home Medications- Prednisone; Home Treatments- rest; return here if the recommended treatment, does not improve the symptoms; Recommended follow up- PCP prn   Richarda Blade, MD 08/04/13 [phone]

## 2013-08-02 NOTE — ED Notes (Signed)
Pt has hx of Chronic back pain; pain has progressed over passed two weeks; tonight pt's back locked up to the point he could not longer ambulate; pt found lying on the floor; vitals WNL; Pt c/o of 10/10 in route pain down to 0 on arrival; received Fent. 100mg  in route;

## 2013-08-10 ENCOUNTER — Encounter: Payer: Self-pay | Admitting: Physical Medicine & Rehabilitation

## 2013-08-11 ENCOUNTER — Encounter (HOSPITAL_COMMUNITY): Payer: Self-pay | Admitting: Emergency Medicine

## 2013-08-11 ENCOUNTER — Emergency Department (HOSPITAL_COMMUNITY)
Admission: EM | Admit: 2013-08-11 | Discharge: 2013-08-11 | Disposition: A | Discharge status: Home / Self Care | Payer: Medicare Other | Attending: Emergency Medicine | Admitting: Emergency Medicine

## 2013-08-11 ENCOUNTER — Emergency Department (HOSPITAL_COMMUNITY): Payer: Medicare Other

## 2013-08-11 DIAGNOSIS — I10 Essential (primary) hypertension: Secondary | ICD-10-CM | POA: Insufficient documentation

## 2013-08-11 DIAGNOSIS — Z87891 Personal history of nicotine dependence: Secondary | ICD-10-CM | POA: Insufficient documentation

## 2013-08-11 DIAGNOSIS — G8929 Other chronic pain: Secondary | ICD-10-CM | POA: Insufficient documentation

## 2013-08-11 DIAGNOSIS — Y9289 Other specified places as the place of occurrence of the external cause: Secondary | ICD-10-CM | POA: Insufficient documentation

## 2013-08-11 DIAGNOSIS — F411 Generalized anxiety disorder: Secondary | ICD-10-CM | POA: Insufficient documentation

## 2013-08-11 DIAGNOSIS — Z8719 Personal history of other diseases of the digestive system: Secondary | ICD-10-CM | POA: Insufficient documentation

## 2013-08-11 DIAGNOSIS — M549 Dorsalgia, unspecified: Secondary | ICD-10-CM

## 2013-08-11 DIAGNOSIS — Y939 Activity, unspecified: Secondary | ICD-10-CM | POA: Insufficient documentation

## 2013-08-11 DIAGNOSIS — M1990 Unspecified osteoarthritis, unspecified site: Secondary | ICD-10-CM

## 2013-08-11 DIAGNOSIS — E860 Dehydration: Secondary | ICD-10-CM | POA: Insufficient documentation

## 2013-08-11 DIAGNOSIS — I509 Heart failure, unspecified: Secondary | ICD-10-CM | POA: Insufficient documentation

## 2013-08-11 DIAGNOSIS — S99929A Unspecified injury of unspecified foot, initial encounter: Secondary | ICD-10-CM

## 2013-08-11 DIAGNOSIS — S8990XA Unspecified injury of unspecified lower leg, initial encounter: Secondary | ICD-10-CM | POA: Insufficient documentation

## 2013-08-11 DIAGNOSIS — W19XXXA Unspecified fall, initial encounter: Secondary | ICD-10-CM

## 2013-08-11 DIAGNOSIS — F329 Major depressive disorder, single episode, unspecified: Secondary | ICD-10-CM | POA: Insufficient documentation

## 2013-08-11 DIAGNOSIS — Z8619 Personal history of other infectious and parasitic diseases: Secondary | ICD-10-CM | POA: Insufficient documentation

## 2013-08-11 DIAGNOSIS — S99919A Unspecified injury of unspecified ankle, initial encounter: Secondary | ICD-10-CM

## 2013-08-11 DIAGNOSIS — Z79899 Other long term (current) drug therapy: Secondary | ICD-10-CM | POA: Insufficient documentation

## 2013-08-11 DIAGNOSIS — F3289 Other specified depressive episodes: Secondary | ICD-10-CM | POA: Insufficient documentation

## 2013-08-11 DIAGNOSIS — IMO0002 Reserved for concepts with insufficient information to code with codable children: Secondary | ICD-10-CM | POA: Insufficient documentation

## 2013-08-11 DIAGNOSIS — N529 Male erectile dysfunction, unspecified: Secondary | ICD-10-CM | POA: Insufficient documentation

## 2013-08-11 DIAGNOSIS — W010XXA Fall on same level from slipping, tripping and stumbling without subsequent striking against object, initial encounter: Secondary | ICD-10-CM | POA: Insufficient documentation

## 2013-08-11 LAB — COMPREHENSIVE METABOLIC PANEL
ALT: 30 U/L (ref 0–53)
AST: 27 U/L (ref 0–37)
Albumin: 3 g/dL — ABNORMAL LOW (ref 3.5–5.2)
Alkaline Phosphatase: 86 U/L (ref 39–117)
BUN: 61 mg/dL — ABNORMAL HIGH (ref 6–23)
CHLORIDE: 109 meq/L (ref 96–112)
CO2: 16 mEq/L — ABNORMAL LOW (ref 19–32)
Calcium: 9.4 mg/dL (ref 8.4–10.5)
Creatinine, Ser: 2.17 mg/dL — ABNORMAL HIGH (ref 0.50–1.35)
GFR calc Af Amer: 33 mL/min — ABNORMAL LOW (ref >90)
GFR calc non Af Amer: 28 mL/min — ABNORMAL LOW (ref >90)
GLUCOSE: 147 mg/dL — AB (ref 70–99)
Potassium: 4.9 mEq/L (ref 3.7–5.3)
SODIUM: 140 meq/L (ref 137–147)
TOTAL PROTEIN: 8 g/dL (ref 6.0–8.3)
Total Bilirubin: 0.4 mg/dL (ref 0.3–1.2)

## 2013-08-11 LAB — CBC
HCT: 36.5 % — ABNORMAL LOW (ref 39.0–52.0)
HEMOGLOBIN: 11.7 g/dL — AB (ref 13.0–17.0)
MCH: 28.5 pg (ref 26.0–34.0)
MCHC: 32.1 g/dL (ref 30.0–36.0)
MCV: 88.8 fL (ref 78.0–100.0)
Platelets: 129 10*3/uL — ABNORMAL LOW (ref 150–400)
RBC: 4.11 MIL/uL — ABNORMAL LOW (ref 4.22–5.81)
RDW: 14.7 % (ref 11.5–15.5)
WBC: 8 10*3/uL (ref 4.0–10.5)

## 2013-08-11 MED ORDER — PREDNISONE 20 MG PO TABS
60.0000 mg | ORAL_TABLET | Freq: Once | ORAL | Status: AC
  Administered 2013-08-11: 60 mg via ORAL
  Filled 2013-08-11: qty 3

## 2013-08-11 MED ORDER — SODIUM CHLORIDE 0.9 % IV BOLUS (SEPSIS): 1000.0000 mL | Freq: Once | INTRAVENOUS | Status: DC

## 2013-08-11 MED ORDER — ONDANSETRON HCL 4 MG/2ML IJ SOLN
4.0000 mg | Freq: Once | INTRAMUSCULAR | Status: AC
  Administered 2013-08-11: 4 mg via INTRAVENOUS
  Filled 2013-08-11: qty 2

## 2013-08-11 MED ORDER — MORPHINE SULFATE 4 MG/ML IJ SOLN
4.0000 mg | Freq: Once | INTRAMUSCULAR | Status: AC
  Administered 2013-08-11: 4 mg via INTRAVENOUS
  Filled 2013-08-11: qty 1

## 2013-08-11 MED ORDER — SODIUM CHLORIDE 0.9 % IV SOLN
INTRAVENOUS | Status: DC
  Administered 2013-08-11: 12:00:00 via INTRAVENOUS

## 2013-08-11 NOTE — ED Provider Notes (Signed)
CSN: [id number]     Arrival date & time 08/11/13  0617 History   First MD Initiated Contact with Patient 08/11/13 0630     Chief Complaint  Patient presents with  . Fall     (Consider location/radiation/quality/duration/timing/severity/associated sxs/prior Treatment) The history is provided by the patient and the spouse. No language interpreter was used.  Gerald Gomez is a 73 year old male with past medical history of anxiety, cholelithiasis, depression, degenerative joint disease, erectile dysfunction, fatigue, congestive heart failure presenting to the ED with a fall that occurred this morning. As per wife, reported that patient had a fall at approximately 1:30 AM-2:00 AM this morning while he was on the commode-reported that he slid down. Wife reported that she had to get a neighbor in order to help her get the patient up. Wife reported that patient then fell again earlier this morning while getting out of the recliner. Wife reported that she was unable to get the patient up for self and had to call EMS. As per wife's report, reported that patient has been having these sliding down/fall episodes for the past 2 weeks intermittently. Patient reports that he has arthritis in his knees and back-reported that he's been living with the pain. As per wife, reported that patient is been seen and assessed by his primary care physician who referred patient to pain clinic and does not have appointment until 09/01/2013. Wife reported that after these episodes of falling down/finding patient was alert and oriented-denied any mental status change or personality changes. Patient reports he is unable to recall exactly how he landed. Denied anticoagulation therapy, head injury, loss of consciousness, blurred vision, sudden loss of vision, fainting, dizziness, chest pain, shortness of breath, difficulty breathing, disorientation, mental status change. PCP Dr. [name]  Past Medical History  Diagnosis Date  .  ALLERGIC RHINITIS 03/28/2009  . ANXIETY 03/28/2009  . CHOLELITHIASIS 03/28/2009  . DEGENERATIVE JOINT DISEASE 03/28/2009  . DEPRESSION 03/28/2009  . [place] DISEASE, LUMBAR 03/28/2009  . DIVERTICULOSIS, COLON 03/28/2009  . 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 FATIGUE 03/28/2009  . HEPATITIS C 03/28/2009  . HYPERLIPIDEMIA 03/28/2009  . HYPERTENSION 03/28/2009  . LOW BACK PAIN, CHRONIC 03/28/2009  . PERIPHERAL VASCULAR DISEASE 03/28/2009  . Personal history of alcoholism 03/28/2009  . RENAL INSUFFICIENCY 03/28/2009  . THROMBOCYTOPENIA 03/28/2009  . Wheezing 02/07/2010  . Gallstones   . CHF (congestive heart failure)    Past Surgical History  Procedure Laterality Date  . Right shoulder surgery  2003   Family History  Problem Relation Age of Onset  . Lung cancer Father   . Stroke Brother   . Breast cancer Daughter    History  Substance Use Topics  . Smoking status: Former Smoker -- 0.25 packs/day    Types: Cigarettes    Quit date: 03/27/1995  . Smokeless tobacco: Not on file  . Alcohol Use: No    Review of Systems  Constitutional: Negative for fever and chills.  Eyes: Negative for visual disturbance.  Respiratory: Negative for cough, chest tightness and shortness of breath.   Cardiovascular: Negative for chest pain.  Gastrointestinal: Negative for nausea, vomiting and abdominal pain.  Musculoskeletal: Positive for arthralgias (Bilateral knee) and back pain. Negative for neck pain.  Neurological: Negative for syncope and weakness.  All other systems reviewed and are negative.     Allergies  Tramadol  Home Medications   Prior to Admission medications   Medication Sig Start Date End Date Taking? Authorizing Provider  albuterol (PROVENTIL  HFA;VENTOLIN HFA) 108 (90 BASE) MCG/ACT inhaler Inhale 1-2 puffs into the lungs every 6 (six) hours as needed for wheezing or shortness of breath.    Historical Provider, MD  citalopram (CELEXA) 10 MG tablet Take 10 mg by mouth daily.    Historical Provider,  MD  clorazepate (TRANXENE) 7.5 MG tablet Take 7.5 mg by mouth 3 (three) times daily as needed for anxiety.    Historical Provider, MD  cycloSPORINE (RESTASIS) 0.05 % ophthalmic emulsion Place 1 drop into both eyes 2 (two) times daily.    Historical Provider, MD  fesoterodine (TOVIAZ) 4 MG TB24 Take 4 mg by mouth daily.    Historical Provider, MD  fluticasone (FLONASE) 50 MCG/ACT nasal spray Place 2 sprays into both nostrils daily.    Historical Provider, MD  furosemide (LASIX) 40 MG tablet Take 1 tablet (40 mg total) by mouth daily. 05/27/13   [name], MD  HYDROcodone-acetaminophen (NORCO) 5-325 MG per tablet Take 2 tablets by mouth every 4 (four) hours as needed. 08/02/13   Richarda Blade, MD  HYDROcodone-acetaminophen (NORCO/VICODIN) 5-325 MG per tablet Take 1 tablet by mouth every 6 (six) hours as needed for moderate pain.    Historical Provider, MD  metoprolol tartrate (LOPRESSOR) 25 MG tablet Take 1 tablet (25 mg total) by mouth 2 (two) times daily. 07/28/13   [name], MD  potassium chloride (K-DUR) 10 MEQ tablet Take 1 tablet (10 mEq total) by mouth 2 (two) times daily. 04/30/13   [name], MD  predniSONE (DELTASONE) 10 MG tablet Take 2 tablets (20 mg total) by mouth 2 (two) times daily. 08/02/13   Richarda Blade, MD  sildenafil (VIAGRA) 100 MG tablet Take 0.5-1 tablets (50-100 mg total) by mouth daily as needed for erectile dysfunction. 04/28/13   [name], MD  tamsulosin (FLOMAX) 0.4 MG CAPS capsule Take 0.4 mg by mouth daily.    Historical Provider, MD   BP 155/73  Pulse 75  Temp(Src) 97.9 F (36.6 C) (Oral)  Resp 16  Ht 6' (1.829 m)  Wt 210 lb (95.255 kg)  BMI 28.47 kg/m2  SpO2 98% Physical Exam  Nursing note and vitals reviewed. Constitutional: He is oriented to person, place, and time. He appears well-developed and well-nourished. No distress.  HENT:  Head: Normocephalic and atraumatic.  Eyes: Conjunctivae and EOM are normal. Pupils are equal, round, and reactive to  light. Right eye exhibits no discharge. Left eye exhibits no discharge.  Neck: Normal range of motion. Neck supple. No tracheal deviation present.  Negative neck stiffness Negative nuchal rigidity Negative cervical lymphadenopathy  Negative pain upon palpation to the c-spine  Cardiovascular: Normal rate, regular rhythm and normal heart sounds.  Exam reveals no friction rub.   No murmur heard. Pulses:      Radial pulses are 2+ on the right side, and 2+ on the left side.       Dorsalis pedis pulses are 2+ on the right side, and 2+ on the left side.  Pulmonary/Chest: Effort normal and breath sounds normal. No respiratory distress. He has no wheezes. He has no rales.  Musculoskeletal: He exhibits tenderness.       Back:       Legs: Negative swelling, erythema, inflammation, lesions, sores, deformities identified to the left shoulder. Discomfort upon palpation to the anterior aspect of the glenohumeral joint of the left shoulder. Negative crepitus upon motion to the left shoulder. Limited range of motion to left shoulder secondary to pain.  Negative swelling, erythema, formation, lesions, sores, deformities identified to bilateral knees. Discomfort upon palpation to the anterior aspect of bilateral knees. Negative findings effusion. Patellar intact bilaterally. Decreased range of motion, mainly flexion to bilateral knees secondary to pain. Negative swelling, erythema, inflammation, lesions, sores noted to the cervical/thoracic/lumbosacral spine - discomfort upon palpation to the mid-lumbosacral spine.  Full range of motion to legs bilaterally without difficulty or ataxia.  Lymphadenopathy:    He has no cervical adenopathy.  Neurological: He is alert and oriented to person, place, and time. No cranial nerve deficit. He exhibits normal muscle tone. Coordination normal.  Cranial nerves III-XII grossly intact Strength 5+/5+ to upper and lower extremities bilaterally with resistance applied, equal  distribution noted Mildly decreased grip strength localize the left upper extremities secondary to pain in the left shoulder Negative facial droop Negative slurred speech Negative aphasia Sensation intact with differentiation sharp and dull touch  Skin: Skin is warm and dry. No rash noted. He is not diaphoretic. No erythema.  Psychiatric: He has a normal mood and affect. His behavior is normal. Thought content normal.    ED Course  Procedures (including critical care time)  7:20 AM This provider spoke with attending physician, Dr. Haywood Lasso regarding case in great detail - patient to be seen by attending physician.   8:54 AM This provider spoke with Case Management - Case Management to see and assess patient. Believes that patient is a good candidate for PT, nurse, and home health aide.  This provider agreed to plan.   10:01 AM Bedside comode to be ordered. Home health nurse, PT, and social worker in the home.  Results for orders placed during the hospital encounter of 08/11/13  CBC      Result Value Ref Range   WBC 8.0  4.0 - 10.5 K/uL   RBC 4.11 (*) 4.22 - 5.81 MIL/uL   Hemoglobin 11.7 (*) 13.0 - 17.0 g/dL   HCT 06.2 (*) 37.6 - 28.3 %   MCV 88.8  78.0 - 100.0 fL   MCH 28.5  26.0 - 34.0 pg   MCHC 32.1  30.0 - 36.0 g/dL   RDW 15.1  76.1 - 60.7 %   Platelets 129 (*) 150 - 400 K/uL  COMPREHENSIVE METABOLIC PANEL      Result Value Ref Range   Sodium 140  137 - 147 mEq/L   Potassium 4.9  3.7 - 5.3 mEq/L   Chloride 109  96 - 112 mEq/L   CO2 16 (*) 19 - 32 mEq/L   Glucose, Bld 147 (*) 70 - 99 mg/dL   BUN 61 (*) 6 - 23 mg/dL   Creatinine, Ser 3.71 (*) 0.50 - 1.35 mg/dL   Calcium 9.4  8.4 - 06.2 mg/dL   Total Protein 8.0  6.0 - 8.3 g/dL   Albumin 3.0 (*) 3.5 - 5.2 g/dL   AST 27  0 - 37 U/L   ALT 30  0 - 53 U/L   Alkaline Phosphatase 86  39 - 117 U/L   Total Bilirubin 0.4  0.3 - 1.2 mg/dL   GFR calc non Af Amer 28 (*) >90 mL/min   GFR calc Af Amer 33 (*) >90 mL/min    Labs  Review Labs Reviewed  CBC - Abnormal; Notable for the following:    RBC 4.11 (*)    Hemoglobin 11.7 (*)    HCT 36.5 (*)    Platelets 129 (*)    All other components within normal limits  COMPREHENSIVE  METABOLIC PANEL - Abnormal; Notable for the following:    CO2 16 (*)    Glucose, Bld 147 (*)    BUN 61 (*)    Creatinine, Ser 2.17 (*)    Albumin 3.0 (*)    GFR calc non Af Amer 28 (*)    GFR calc Af Amer 33 (*)    All other components within normal limits    Imaging Review Dg Lumbar Spine 2-3 Views  08/11/2013   CLINICAL DATA:  73 year old male with pain. Initial encounter.  EXAM: LUMBAR SPINE - 2-3 VIEW  COMPARISON:  08/02/2013 lumbar series.  FINDINGS: The examination had to be discontinued prior to completion due to patient refusal. Subsequently, an AP and 1 oblique view of the lumbar spine are obtained.  On these two views there is stable lumbar vertebral height and alignment. Chronic moderate to severe L4-L5 and L5-S1 facet hypertrophy again noted. Degenerative endplate spurring at the same levels again noted. Sacral ala and SI joints appear stable.  IMPRESSION: Stable Limited radiographic appearance of the lumbar spine as detailed above.   Electronically Signed   By: Lars Pinks M.D.   On: 08/11/2013 08:51   Ct Head Wo Contrast  08/11/2013   CLINICAL DATA:  Status post fall  EXAM: CT HEAD WITHOUT CONTRAST  CT CERVICAL SPINE WITHOUT CONTRAST  TECHNIQUE: Multidetector CT imaging of the head and cervical spine was performed following the standard protocol without intravenous contrast. Multiplanar CT image reconstructions of the cervical spine were also generated.  COMPARISON:  None.  FINDINGS: CT HEAD FINDINGS  There is mild age appropriate diffuse cerebral atrophy with mild compensatory ventriculomegaly. There is no intracranial hemorrhage nor intracranial mass effect. There are no abnormal intracranial calcifications. The cerebellum and brainstem are normal in density.  At bone window  settings the observed portions of the paranasal sinuses and mastoid air cells are clear. There is no evidence of an acute skull fracture.  CT CERVICAL SPINE FINDINGS  The cervical vertebral bodies are preserved in height. Mild degenerative disc changes noted at all levels with calcification of posterior disc bulges at multiple levels. Small anterior endplate osteophytes are noted at C4-5, C5-6, and at C6-7. There is no evidence of a perched facet nor of a facet nor spinous process fracture. There is mild facet joint degenerative change bilaterally in the mid cervical spine. The odontoid is intact and the lateral masses of C1 align normally with those of C2. The bony ring at each cervical level is intact. The observed portions of the first and second ribs appear normal. The pulmonary apices exhibit mild emphysematous change but no acute abnormalities. The soft tissues of the neck exhibit no acute abnormalities.  IMPRESSION: 1. There is no acute intracranial hemorrhage nor evidence of an evolving ischemic infarction. There is no intracranial mass effect or hydrocephalus. 2. There are mild age appropriate diffuse atrophic changes with mild compensatory ventriculomegaly. 3. There is no evidence of an acute cervical spine fracture nor dislocation. There are degenerative disc and facet joint changes at multiple levels.   Electronically Signed   By: David  [place]   On: 08/11/2013 08:28   Ct Cervical Spine Wo Contrast  08/11/2013   CLINICAL DATA:  Status post fall  EXAM: CT HEAD WITHOUT CONTRAST  CT CERVICAL SPINE WITHOUT CONTRAST  TECHNIQUE: Multidetector CT imaging of the head and cervical spine was performed following the standard protocol without intravenous contrast. Multiplanar CT image reconstructions of the cervical spine were also generated.  COMPARISON:  None.  FINDINGS: CT HEAD FINDINGS  There is mild age appropriate diffuse cerebral atrophy with mild compensatory ventriculomegaly. There is no intracranial  hemorrhage nor intracranial mass effect. There are no abnormal intracranial calcifications. The cerebellum and brainstem are normal in density.  At bone window settings the observed portions of the paranasal sinuses and mastoid air cells are clear. There is no evidence of an acute skull fracture.  CT CERVICAL SPINE FINDINGS  The cervical vertebral bodies are preserved in height. Mild degenerative disc changes noted at all levels with calcification of posterior disc bulges at multiple levels. Small anterior endplate osteophytes are noted at C4-5, C5-6, and at C6-7. There is no evidence of a perched facet nor of a facet nor spinous process fracture. There is mild facet joint degenerative change bilaterally in the mid cervical spine. The odontoid is intact and the lateral masses of C1 align normally with those of C2. The bony ring at each cervical level is intact. The observed portions of the first and second ribs appear normal. The pulmonary apices exhibit mild emphysematous change but no acute abnormalities. The soft tissues of the neck exhibit no acute abnormalities.  IMPRESSION: 1. There is no acute intracranial hemorrhage nor evidence of an evolving ischemic infarction. There is no intracranial mass effect or hydrocephalus. 2. There are mild age appropriate diffuse atrophic changes with mild compensatory ventriculomegaly. 3. There is no evidence of an acute cervical spine fracture nor dislocation. There are degenerative disc and facet joint changes at multiple levels.   Electronically Signed   By: David  [place]   On: 08/11/2013 08:28   Dg Shoulder Left  08/11/2013   CLINICAL DATA:  73 year old male with pain. Initial encounter.  EXAM: LEFT SHOULDER - 2+ VIEW  COMPARISON:  Chest radiograph 04/18/2013.  FINDINGS: The examination had to be discontinued prior to completion due to patient refusal. A single scapular Y-view was obtained.  No glenohumeral joint dislocation. Scapula appears intact. Visible left ribs  intact.  IMPRESSION: Only a single scapular Y-view was obtained before the patient declined additional imaging. No evidence of glenohumeral joint dislocation or scapula fracture.   Electronically Signed   By: Lars Pinks M.D.   On: 08/11/2013 08:55     EKG Interpretation None      MDM   Final diagnoses:  Fall  Chronic back pain  Arthritis   Medications  0.9 %  sodium chloride infusion ( Intravenous New Bag/Given 08/11/13 1224)  predniSONE (DELTASONE) tablet 60 mg (60 mg Oral Given 08/11/13 0902)  morphine 4 MG/ML injection 4 mg (4 mg Intravenous Given 08/11/13 1257)  ondansetron (ZOFRAN) injection 4 mg (4 mg Intravenous Given 08/11/13 1257)   Filed Vitals:   08/11/13 1113 08/11/13 1116 08/11/13 1230 08/11/13 1330  BP: 167/82 141/74 174/74 155/73  Pulse: 91 92 80 75  Temp:      TempSrc:      Resp:      Height:      Weight:      SpO2:   99% 98%    This provider reviewed the patient's chart. On 07/28/2013 patient was seen and assessed by his PCP regarding similar issues of frequent falls - within the past 6 months patient had at least 3 falls. Patient reported experiencing weakness for the past couple of weeks during this visit. Patient may be proper candidate for assisted nursing home.  CBC negative elevated white blood cell count noted. Hemoglobin 11.7, hematocrit 36.5. CMP noted elevated BUN and creatinine-BUN 61, creatinine  2.17. Bicarb noted to be decreased - CO2 16. Negative elevated liver enzymes or bilirubin. CT head no acute intracranial hemorrhage or evidence of an evolving ischemic infarction-no intracranial mass effect or fracture of syphilis. Mild age appropriate diffuse atrophic changes with small cup with very ventriculomegaly. CT cervical spine negative for acute cervical spine fracture or dislocation-degenerative disease and phasic changes are noted. Plain film of lumbar spine noted chronic moderate to severe L4-L5 and L5-S1 base of hypertrophy-degenerative endplate spurring  noted at the same levels. Left shoulder plain film noted no evidence of glenohumeral joint dislocation or scapula fracture. Patient appeared to be mildly dehydrated while in the ED setting - fluids administered via IV. Pain controlled in ED setting. Patient seen and assessed by attending physician, Dr. Marylene Buerger who cleared patient for discharge. This appears to be a chronic issue - patient has been seen and assessed by his PCP regarding frequent falls - patient fits a proper candidate for nursing home facilities. Social worker, PT, and RN have been set-up for the patient for assessment of the home as well as assistance at home. Patient stable, afebrile. Patient neurological intact. Negative focal neurological deficits noted. Patient has known history of AKI - is being followed by PCP. Patient does not appear septic. Discharged patient. Referred to PCP. Discussed with patient to avoid any physical or strenuous activity. Discussed with patient to closely monitor symptoms and if symptoms are to worsen or change to report back to the ED - strict return instructions given.  Patient agreed to plan of care, understood, all questions answered.   Jamse Mead, PA-C 08/11/13 1714

## 2013-08-11 NOTE — Discharge Instructions (Signed)
Please call your doctor for a followup appointment within 24-48 hours. When you talk to your doctor please let them know that you were seen in the emergency department and have them acquire all of your records so that they can discuss the findings with you and formulate a treatment plan to fully care for your new and ongoing problems. Please call and set-up an appointment with your primary care provider to be seen and re-assessed within the next 24-48 hours Please use walker to to get around the house Social Worker has been setup to assess the house for risk factors for fall Physical Therapy and Nurse has been set-up for aid at home Please rest and stay hydrated Please avoid any physical or strenuous activity Please continue to monitor symptoms and if symptoms are to worsen or change (fever greater than 101, chills, sweating, nausea, vomiting, chest pain, shortness of breath, difficulty breathing, numbness, tingling, worsening or changes to pain pattern, head injury, changes to personality, changes to behavior) please report back to the ED immediately   Arthritis, Nonspecific Arthritis is inflammation of a joint. This usually means pain, redness, warmth or swelling are present. One or more joints may be involved. There are a number of types of arthritis. Your caregiver may not be able to tell what type of arthritis you have right away. CAUSES  The most common cause of arthritis is the wear and tear on the joint (osteoarthritis). This causes damage to the cartilage, which can break down over time. The knees, hips, back and neck are most often affected by this type of arthritis. Other types of arthritis and common causes of joint pain include:  Sprains and other injuries near the joint. Sometimes minor sprains and injuries cause pain and swelling that develop hours later.  Rheumatoid arthritis. This affects hands, feet and knees. It usually affects both sides of your body at the same time. It is  often associated with chronic ailments, fever, weight loss and general weakness.  Crystal arthritis. Gout and pseudo gout can cause occasional acute severe pain, redness and swelling in the foot, ankle, or knee.  Infectious arthritis. Bacteria can get into a joint through a break in overlying skin. This can cause infection of the joint. Bacteria and viruses can also spread through the blood and affect your joints.  Drug, infectious and allergy reactions. Sometimes joints can become mildly painful and slightly swollen with these types of illnesses. SYMPTOMS   Pain is the main symptom.  Your joint or joints can also be red, swollen and warm or hot to the touch.  You may have a fever with certain types of arthritis, or even feel overall ill.  The joint with arthritis will hurt with movement. Stiffness is present with some types of arthritis. DIAGNOSIS  Your caregiver will suspect arthritis based on your description of your symptoms and on your exam. Testing may be needed to find the type of arthritis:  Blood and sometimes urine tests.  X-ray tests and sometimes CT or MRI scans.  Removal of fluid from the joint (arthrocentesis) is done to check for bacteria, crystals or other causes. Your caregiver (or a specialist) will numb the area over the joint with a local anesthetic, and use a needle to remove joint fluid for examination. This procedure is only minimally uncomfortable.  Even with these tests, your caregiver may not be able to tell what kind of arthritis you have. Consultation with a specialist (rheumatologist) may be helpful. TREATMENT  Your caregiver will  discuss with you treatment specific to your type of arthritis. If the specific type cannot be determined, then the following general recommendations may apply. Treatment of severe joint pain includes:  Rest.  Elevation.  Anti-inflammatory medication (for example, ibuprofen) may be prescribed. Avoiding activities that cause  increased pain.  Only take over-the-counter or prescription medicines for pain and discomfort as recommended by your caregiver.  Cold packs over an inflamed joint may be used for 10 to 15 minutes every hour. Hot packs sometimes feel better, but do not use overnight. Do not use hot packs if you are diabetic without your caregiver's permission.  A cortisone shot into arthritic joints may help reduce pain and swelling.  Any acute arthritis that gets worse over the next 1 to 2 days needs to be looked at to be sure there is no joint infection. Long-term arthritis treatment involves modifying activities and lifestyle to reduce joint stress jarring. This can include weight loss. Also, exercise is needed to nourish the joint cartilage and remove waste. This helps keep the muscles around the joint strong. HOME CARE INSTRUCTIONS   Do not take aspirin to relieve pain if gout is suspected. This elevates uric acid levels.  Only take over-the-counter or prescription medicines for pain, discomfort or fever as directed by your caregiver.  Rest the joint as much as possible.  If your joint is swollen, keep it elevated.  Use crutches if the painful joint is in your leg.  Drinking plenty of fluids may help for certain types of arthritis.  Follow your caregiver's dietary instructions.  Try low-impact exercise such as:  Swimming.  Water aerobics.  Biking.  Walking.  Morning stiffness is often relieved by a warm shower.  Put your joints through regular range-of-motion. SEEK MEDICAL CARE IF:   You do not feel better in 24 hours or are getting worse.  You have side effects to medications, or are not getting better with treatment. SEEK IMMEDIATE MEDICAL CARE IF:   You have a fever.  You develop severe joint pain, swelling or redness.  Many joints are involved and become painful and swollen.  There is severe back pain and/or leg weakness.  You have loss of bowel or bladder  control. Document Released: 04/19/2004 Document Revised: 06/04/2011 Document Reviewed: 05/05/2008 [facility] Patient Information 2014 [place].

## 2013-08-11 NOTE — ED Notes (Signed)
Case manager at bedside 

## 2013-08-11 NOTE — ED Notes (Signed)
Consulted Case Management RN about speaking with family for home concerns.

## 2013-08-11 NOTE — ED Notes (Signed)
Pt trying to stand up after instructing to sit due to high fall risk

## 2013-08-11 NOTE — Progress Notes (Signed)
CARE MANAGEMENT ED NOTE 08/11/2013  Patient:  Gerald Gomez, Gerald Gomez   Account Number:  [id number]  Date Initiated:  08/11/2013  Documentation initiated by:  Gerald Gomez  Subjective/Objective Assessment:   73 yo male presenting to the ED after falling x2 in the home     Subjective/Objective Assessment Detail:   The history is provided by the patient and the spouse. No language interpreter was used.  Gerald Gomez is a 73 year old male with past medical history of anxiety, cholelithiasis, depression, degenerative joint disease, erectile dysfunction, fatigue, congestive heart failure presenting to the ED with a fall that occurred this morning. As per wife, reported that patient had a fall at approximately 1:30 AM-2:00 AM this morning while he was on the commode-reported that he slid down. Wife reported that she had to get a neighbor in order to help her get the patient up. Wife reported that patient then fell again earlier this morning while getting out of the recliner. Wife reported that she was unable to get the patient up for self and had to call EMS. As per wife's report, reported that patient has been having these sliding down/fall episodes for the past 2 weeks intermittently. Patient reports that he has arthritis in his knees and back-reported that he's been living with the pain. As per wife, reported that patient is been seen and assessed by his primary care physician who referred patient to pain clinic and does not have appointment until 09/01/2013. Wife reported that after these episodes of falling down/finding patient was alert and oriented-denied any mental status change or personality changes. Patient reports he is unable to recall exactly how he landed. Denied anticoagulation therapy, head injury, loss of consciousness, blurred vision, sudden loss of vision, fainting, dizziness, chest pain, shortness of breath, difficulty breathing, disorientation, mental status change.  PCP Dr. [name]      Action/Plan:   3 n1 BSC delivered to patient room, and [facility] RN, PT, SW servcies were initiated with patient and spouse choice of [place]   Action/Plan Detail:   Anticipated DC Date:       Status Recommendation to Physician:   Result of Recommendation:  Agreed    DC Planning Services  Other  CM consult    Choice offered to / List presented to:  C-3 Spouse  DME arranged  3-N-1     DME agency  [place] arranged  HH-1 RN  [place]    Status of service:  Completed, signed off  ED Comments:   ED Comments Detail:  CM consulted for available resources for patient and spouse in the home. CM spoke with the patient and the spouse and they stated that the patient is weak and has difficulty with ambulation due to pain from arthritis. Patient spouse states that the patient is frequently lowering himself to the floor while ambulating with his walker and she calls on the neighbors to help get him back up. When asked the patient stated that is uncertain as to why he falls or why he has to lower himself to the ground. Discussed equipment in the home and patient stated that he uses either a cane or a walker. Recommended a BSC since patient is on Lasix for CHF management and 3 in 1 BSC was ordered. Discussed the benefits of [facility] services and patient and spouse accepted RN, PT, and SW services and the spouse and  patient chose [facility] as the provider. Mary with [facility] was contacted and [facility] services were initiated. Communication was also sent to Dr. [name]. patient PCP, to make aware of services initiated in the home. EDP also updated with plan. No further questions or concerns.

## 2013-08-11 NOTE — ED Notes (Signed)
Pt arrives via [facility]. Wife called EMS. Pt had fallen twice this morning, neighboor picked him up the first time. Wife states pt has chronic back pain and arthritis in his knees. States that the knee weakness has progressively gotten worse. States that he is supposed to see a neurologist on the 3rd for evaluation and possible rehab. Wife states that she cannot get him off the floor and the weakness has gotten too bad. No injuries visible. Pt denies pain at this time. BP 140/90 HR 88 RR 18 CBG 142.

## 2013-08-11 NOTE — ED Notes (Signed)
Bed side commode brought to pts room.

## 2013-08-11 NOTE — ED Notes (Signed)
Attempted orthostatics. Pt tried to sit up on the side of the bed and was in too much pain to sit up or stand. Case manager is currently at bedside.

## 2013-08-12 NOTE — ED Provider Notes (Signed)
Medical screening examination/treatment/procedure(s) were performed by non-physician practitioner and as supervising physician I was immediately available for consultation/collaboration.   EKG Interpretation None            Hoy Morn, MD 08/12/13 [phone]

## 2013-08-19 ENCOUNTER — Ambulatory Visit: Payer: Medicare Other

## 2013-09-01 ENCOUNTER — Ambulatory Visit (HOSPITAL_BASED_OUTPATIENT_CLINIC_OR_DEPARTMENT_OTHER): Payer: Medicare Other | Admitting: Physical Medicine & Rehabilitation

## 2013-09-01 ENCOUNTER — Encounter: Payer: Self-pay | Admitting: Physical Medicine & Rehabilitation

## 2013-09-01 ENCOUNTER — Encounter: Payer: Medicare Other | Attending: Physical Medicine & Rehabilitation

## 2013-09-01 VITALS — BP 128/78 | HR 83 | Resp 14 | Ht 72.0 in | Wt 209.6 lb

## 2013-09-01 DIAGNOSIS — M25569 Pain in unspecified knee: Secondary | ICD-10-CM

## 2013-09-01 DIAGNOSIS — M545 Low back pain, unspecified: Secondary | ICD-10-CM

## 2013-09-01 DIAGNOSIS — Z87891 Personal history of nicotine dependence: Secondary | ICD-10-CM | POA: Insufficient documentation

## 2013-09-01 DIAGNOSIS — R259 Unspecified abnormal involuntary movements: Secondary | ICD-10-CM | POA: Insufficient documentation

## 2013-09-01 DIAGNOSIS — M1990 Unspecified osteoarthritis, unspecified site: Secondary | ICD-10-CM | POA: Insufficient documentation

## 2013-09-01 DIAGNOSIS — G8929 Other chronic pain: Secondary | ICD-10-CM

## 2013-09-01 DIAGNOSIS — E785 Hyperlipidemia, unspecified: Secondary | ICD-10-CM | POA: Insufficient documentation

## 2013-09-01 DIAGNOSIS — I509 Heart failure, unspecified: Secondary | ICD-10-CM | POA: Insufficient documentation

## 2013-09-01 DIAGNOSIS — Z79899 Other long term (current) drug therapy: Secondary | ICD-10-CM

## 2013-09-01 DIAGNOSIS — B1920 Unspecified viral hepatitis C without hepatic coma: Secondary | ICD-10-CM | POA: Insufficient documentation

## 2013-09-01 DIAGNOSIS — M79609 Pain in unspecified limb: Secondary | ICD-10-CM | POA: Insufficient documentation

## 2013-09-01 DIAGNOSIS — R269 Unspecified abnormalities of gait and mobility: Secondary | ICD-10-CM

## 2013-09-01 DIAGNOSIS — Z5181 Encounter for therapeutic drug level monitoring: Secondary | ICD-10-CM

## 2013-09-01 DIAGNOSIS — I10 Essential (primary) hypertension: Secondary | ICD-10-CM | POA: Insufficient documentation

## 2013-09-01 MED ORDER — GABAPENTIN 100 MG PO CAPS
100.0000 mg | ORAL_CAPSULE | Freq: Three times a day (TID) | ORAL | Status: DC
Start: 2013-09-01 — End: 2013-09-14

## 2013-09-01 NOTE — Patient Instructions (Signed)
Will try medicine called gabapentin 100 mg 3 times per day. This is good for pain shooting down from the back toward the legs. I suspect the knee pain is related to the back. Given the history of falls however I would like to check x-rays of the knees I also suspect there may be a neurologic disorder that is causing the walking difficulties in addition to the low back pain and I am recommending a neurologic consultation and have made a referral

## 2013-09-01 NOTE — Progress Notes (Signed)
Subjective:    Patient ID: Gerald Gomez, male    DOB: 12/16/1940, 73 y.o.   MRN: [id number]  HPI CC:  R>L leg Pain >40 yr hx of low back pain with increasing knee pain Has had several falls since summer of 2014, 3 falls in may, 2015 Has been using walker for the last couple wks, no falls with walker Was using cane to ambulating with cane for ~2 yrs prior to that  No hx of lumbar injections Never tried gabapentin On hydrocodone for ~1 yr Stopped ibuprofen due to kidney disease  CT head and c spine 08/11/13 unremarkable  CBC Latest Ref Rng 08/11/2013 07/28/2013 04/19/2013  WBC 4.0 - 10.5 K/uL 8.0 7.0 6.4  Hemoglobin 13.0 - 17.0 g/dL 11.7(L) 10.9(L) 10.2(L)  Hematocrit 39.0 - 52.0 % 36.5(L) 33.5(L) 32.1(L)  Platelets 150 - 400 K/uL 129(L) 100.0(L) 75(L)   BMET    Component Value Date/Time   NA 140 08/11/2013 0750   K 4.9 08/11/2013 0750   CL 109 08/11/2013 0750   CO2 16* 08/11/2013 0750   GLUCOSE 147* 08/11/2013 0750   BUN 61* 08/11/2013 0750   CREATININE 2.17* 08/11/2013 0750   CALCIUM 9.4 08/11/2013 0750   GFRNONAA 28* 08/11/2013 0750   GFRAA 33* 08/11/2013 0750   Sees nephrology   Has had multiple Xrays showing Lumbar DDD and spondylosis  Currently has in home therapy for leg strength and walking Pain Inventory Average Pain 6 Pain Right Now 6 My pain is aching  In the last 24 hours, has pain interfered with the following? General activity 7 Relation with others 6 Enjoyment of life 6 What TIME of day is your pain at its worst? all Sleep (in general) Poor  Pain is worse with: walking, bending and standing Pain improves with: medication Relief from Meds: 5  Mobility walk with assistance use a cane use a walker how many minutes can you walk? 5 ability to climb steps?  yes do you drive?  yes needs help with transfers  Function retired I need assistance with the following:  dressing, meal prep, household duties and shopping  Neuro/Psych bladder control  problems trouble walking depression anxiety  Prior Studies Any changes since last visit?  no FINDINGS:  The examination had to be discontinued prior to completion due to  patient refusal. Subsequently, an AP and 1 oblique view of the  lumbar spine are obtained.  On these two views there is stable lumbar vertebral height and  alignment. Chronic moderate to severe L4-L5 and L5-S1 facet  hypertrophy again noted. Degenerative endplate spurring at the same  levels again noted. Sacral ala and SI joints appear stable.  IMPRESSION:  Stable Limited radiographic appearance of the lumbar spine as  detailed above.  Physicians involved in your care Any changes since last visit?  no   Family History  Problem Relation Age of Onset  . Lung cancer Father   . Stroke Brother   . Breast cancer Daughter    History   Social History  . Marital Status: Married    Spouse Name: N/A    Number of Children: N/A  . Years of Education: N/A   Social History Main Topics  . Smoking status: Former Smoker -- 0.25 packs/day    Types: Cigarettes    Quit date: 03/27/1995  . Smokeless tobacco: None  . Alcohol Use: No  . Drug Use: No  . Sexual Activity: No   Other Topics Concern  . None   Social  History Narrative  . None   Past Surgical History  Procedure Laterality Date  . Right shoulder surgery  2003   Past Medical History  Diagnosis Date  . ALLERGIC RHINITIS 03/28/2009  . ANXIETY 03/28/2009  . CHOLELITHIASIS 03/28/2009  . DEGENERATIVE JOINT DISEASE 03/28/2009  . DEPRESSION 03/28/2009  . [place] DISEASE, LUMBAR 03/28/2009  . DIVERTICULOSIS, COLON 03/28/2009  . 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 FATIGUE 03/28/2009  . HEPATITIS C 03/28/2009  . HYPERLIPIDEMIA 03/28/2009  . HYPERTENSION 03/28/2009  . LOW BACK PAIN, CHRONIC 03/28/2009  . PERIPHERAL VASCULAR DISEASE 03/28/2009  . Personal history of alcoholism 03/28/2009  . RENAL INSUFFICIENCY 03/28/2009  . THROMBOCYTOPENIA 03/28/2009  . Wheezing 02/07/2010  .  Gallstones   . CHF (congestive heart failure)    BP 128/78  Pulse 83  Resp 14  Ht 6' (1.829 m)  Wt 209 lb 9.6 oz (95.074 kg)  BMI 28.42 kg/m2  SpO2 100%  Opioid Risk Score:   Fall Risk Score:    Review of Systems  Constitutional: Positive for unexpected weight change.  Respiratory: Positive for shortness of breath and wheezing.   Genitourinary:       Bladder control problems  Musculoskeletal: Positive for back pain and gait problem.  Psychiatric/Behavioral: Positive for dysphoric mood. The patient is nervous/anxious.   All other systems reviewed and are negative.      Objective:   Physical Exam  Nursing note and vitals reviewed. Constitutional: He appears well-developed and well-nourished.  HENT:  Head: Normocephalic and atraumatic.  Eyes: Conjunctivae and EOM are normal. Pupils are equal, round, and reactive to light.  Neurological: He is alert. He displays no atrophy and no tremor. No sensory deficit. Gait abnormal.  Reflex Scores:      Tricep reflexes are 2+ on the right side and 2+ on the left side.      Bicep reflexes are 1+ on the right side and 1+ on the left side.      Brachioradialis reflexes are 1+ on the right side and 1+ on the left side.      Patellar reflexes are 1+ on the right side and 1+ on the left side.      Achilles reflexes are 0 on the right side and 0 on the left side. Oriented to person, not date, not oriented to street, near Dr. Jenny Reichmann  Forward flexed posture during ambulation Unable to stand erect. Limited by pain  Negative straight leg raising bilateral   Psychiatric: His affect is blunt. His speech is delayed. He is slowed and withdrawn.  Oriented to person, place i.e. doctor's office but could not name street. Not oriented to date or date He is inattentive.   motor strength is 4/5 in bilateral deltoid, bicep, tricep, grip, hip flexor, knee extensors, ankle dorsiflexor and plantar flexor Sensory exam intact to pinprick bilateral C5 C6-C7 C8  as well as bilateral L3-L4 L5-S1 dermatomal distribution Tone no evidence of cog wheel rigidity however has difficulty with relaxation of both upper and lower limbs  Musculoskeletal: No evidence of knee effusion no evidence of erythema around the knees. No lotion B. edema No tenderness along the joint line.       Assessment & Plan:  #1. 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 Needs axial imaging to further assess.Given that the patient is not very interested in surgery, I don't think an MRI of the lumbar spine is needed at this time. Recent lumbar  x-rays demonstrate multilevel lumbar facet arthrosis. May respond to medial branch blocks. Main concern is whether he would tolerate laying in a prone position for about 15 minutes.  2. Bilateral knee pain does have limited range of motion at the knee but no evidence of inflammation. I suspect chronic knee contracture. Several falls in May, check x-rays to evaluate for trauma This pain may be reticular in nature and I will start gabapentin given no relief with Tylenol or hydrocodone.  3. Bradykinesia, masked facies, cognitive deficits, certainly more than can be explained by his lumbar spine issues. Recent CT head showed age related atrophy no recent infarcts. Would like neurology to evaluate for movement disorder

## 2013-09-08 ENCOUNTER — Ambulatory Visit (HOSPITAL_COMMUNITY)
Admission: RE | Admit: 2013-09-08 | Discharge: 2013-09-08 | Disposition: A | Discharge status: Home / Self Care | Payer: Medicare Other | Source: Ambulatory Visit | Attending: Physical Medicine & Rehabilitation | Admitting: Physical Medicine & Rehabilitation

## 2013-09-08 DIAGNOSIS — IMO0002 Reserved for concepts with insufficient information to code with codable children: Secondary | ICD-10-CM | POA: Insufficient documentation

## 2013-09-08 DIAGNOSIS — G8929 Other chronic pain: Secondary | ICD-10-CM

## 2013-09-08 DIAGNOSIS — M1710 Unilateral primary osteoarthritis, unspecified knee: Secondary | ICD-10-CM | POA: Insufficient documentation

## 2013-09-08 DIAGNOSIS — M1120 Other chondrocalcinosis, unspecified site: Secondary | ICD-10-CM | POA: Insufficient documentation

## 2013-09-08 DIAGNOSIS — M25569 Pain in unspecified knee: Secondary | ICD-10-CM

## 2013-09-09 ENCOUNTER — Telehealth: Payer: Self-pay

## 2013-09-09 NOTE — Telephone Encounter (Signed)
Physical therapist from [facility] called and stated that they need an rx for a rolling walker and that it should be faxed to [phone]. Please advise

## 2013-09-09 NOTE — Telephone Encounter (Signed)
Done hardcopy to [facility]  

## 2013-09-10 NOTE — Telephone Encounter (Signed)
Called [facility] left detailed message script for rolling walker is being faxed as requested.

## 2013-09-14 ENCOUNTER — Telehealth: Payer: Self-pay

## 2013-09-14 MED ORDER — GABAPENTIN 100 MG PO CAPS: ORAL_CAPSULE | ORAL | Status: DC

## 2013-09-14 NOTE — Telephone Encounter (Signed)
The patient reported no improvement with hydrocodone at last visit therefore will not restart  Will increase gabapentin to 200 mg 3 times per day

## 2013-09-14 NOTE — Telephone Encounter (Signed)
Patient's wife called on his behalf. She states patient is out of Hydrocodone, and wanted to know if Dr. Letta Pate is going to start prescribing for patient. Please advise.

## 2013-09-14 NOTE — Telephone Encounter (Signed)
Contacted patient's wife to inform her that Dr. Letta Pate is not going to restart patient's Hydrocodone. Also informed her that Dr. Letta Pate increased patient's Gabapentin to 200 mg TID. Reordered Gabapentin 100 mg #180. Advised patient to finish taking the Gabapentin he already has, but increase to two tablets TID, and the refill will be at the pharmacy.

## 2013-09-24 ENCOUNTER — Telehealth: Payer: Self-pay | Admitting: *Deleted

## 2013-09-24 NOTE — Telephone Encounter (Signed)
Ok for verbal 

## 2013-09-24 NOTE — Telephone Encounter (Signed)
Requesting verbal order to continue seeing pt 2 times a week for 2 more weeks for PT.../lmb

## 2013-09-24 NOTE — Telephone Encounter (Signed)
Called [facility] back no answer LMOM with md response...[name]

## 2013-09-28 ENCOUNTER — Telehealth: Payer: Self-pay | Admitting: Neurology

## 2013-09-28 ENCOUNTER — Telehealth: Payer: Self-pay

## 2013-09-28 ENCOUNTER — Ambulatory Visit (INDEPENDENT_AMBULATORY_CARE_PROVIDER_SITE_OTHER): Payer: Medicare Other | Admitting: Neurology

## 2013-09-28 ENCOUNTER — Encounter: Payer: Self-pay | Admitting: Neurology

## 2013-09-28 VITALS — BP 140/82 | HR 80 | Temp 99.2°F | Resp 16 | Ht 72.0 in

## 2013-09-28 DIAGNOSIS — M5137 Other intervertebral disc degeneration, lumbosacral region: Secondary | ICD-10-CM

## 2013-09-28 DIAGNOSIS — G20 Parkinson's disease: Secondary | ICD-10-CM

## 2013-09-28 DIAGNOSIS — G20A1 Parkinson's disease without dyskinesia, without mention of fluctuations: Secondary | ICD-10-CM | POA: Insufficient documentation

## 2013-09-28 MED ORDER — CARBIDOPA-LEVODOPA 25-100 MG PO TABS: 1.0000 | ORAL_TABLET | Freq: Three times a day (TID) | ORAL | Status: DC

## 2013-09-28 MED ORDER — AMBULATORY NON FORMULARY MEDICATION: 1.0000 | Freq: Every day | Status: DC

## 2013-09-28 MED ORDER — GABAPENTIN 300 MG PO CAPS
300.0000 mg | ORAL_CAPSULE | Freq: Three times a day (TID) | ORAL | Status: DC
Start: 2013-09-28 — End: 2013-10-26

## 2013-09-28 NOTE — Telephone Encounter (Signed)
Increase gabapentin to 300mg  TID for now.

## 2013-09-28 NOTE — Telephone Encounter (Signed)
Patient's wife called on his behalf and states that the Gabapentin that Dr. Letta Pate prescribed for him is not working. Patient can hardly get out of bed or walk this morning. Patient has a appt with the Neurologist that Dr. Letta Pate referred him to today if the wife can get him there. Advised the wife to inform the neurologist of patient's problems also. Any other suggestions?

## 2013-09-28 NOTE — Telephone Encounter (Signed)
Referral sent to [facility] for PT and OT with home safety evaluation. Referral faxed to [phone] with confirmation received.

## 2013-09-28 NOTE — Patient Instructions (Signed)
1. Start Carbidopa Levodopa as follows: 1/2 tab three times a day before meals x 1 wk, then 1/2 in am & noon & 1 in evening for a week, then 1/2 in am &1 at noon &one in evening for a week, then 1 tablet three times a day before meals.    

## 2013-09-28 NOTE — Progress Notes (Signed)
Gerald Gomez was seen today in the movement disorders clinic for neurologic consultation at the request of Dr. Letta Pate.  His PCP is [name], MD.  The consultation is for the evaluation of gait instability out of proportion to the degenerative changes in his back.  Pt is accompanied by his wife, who supplements the history.  Pt has had difficulty walking for about 1.5 years.  His wife reports that he began to fall.  Some of the falls were associated with acute CHF.  He was hospitalized in May for falls.  He was had gotten up to go to the kitchen and had fallen on the floor and could not get off the floor.  There was no LOC.  He is now receiving in home PT.  His wife is not sure if he is getting stronger or not.  She is requiring help to get him to stand.    Specific Symptoms:  Tremor: No. (only with transfers when the L leg gets weak) Voice: no change Sleep: back pain disturbs sleep  Vivid Dreams:  No.  Acting out dreams:  No. Wet Pillows: No. Postural symptoms:  Yes.    Falls?  Yes.   Bradykinesia symptoms: slow movements and difficulty getting out of a chair (using walker now) Loss of smell:  No. Loss of taste:  No. Urinary Incontinence:  No. Difficulty Swallowing:  No. Handwriting, micrographia: Yes.   Trouble with ADL's:  Yes.    Trouble buttoning clothing: Yes.   Depression:  Yes.   Memory changes:  Yes.   per wife, but "not bad" per patient; wife does finance and always has; wife prepares meds most of the time but sometimes pt does it.   Hallucinations:  No.  visual distortions: No. N/V:  No. Lightheaded:  No.  Syncope: No. Diplopia:  No. Dyskinesia:  No.   PREVIOUS MEDICATIONS: none to date  ALLERGIES:   Allergies  Allergen Reactions  . Tramadol Nausea Only    CURRENT MEDICATIONS:  Current Outpatient Prescriptions on File Prior to Visit  Medication Sig Dispense Refill  . albuterol (PROVENTIL HFA;VENTOLIN HFA) 108 (90 BASE) MCG/ACT inhaler Inhale 1-2 puffs  into the lungs every 6 (six) hours as needed for wheezing or shortness of breath.      . citalopram (CELEXA) 10 MG tablet Take 10 mg by mouth daily.      . clorazepate (TRANXENE) 7.5 MG tablet Take 7.5 mg by mouth 3 (three) times daily as needed for anxiety.      . cycloSPORINE (RESTASIS) 0.05 % ophthalmic emulsion Place 1 drop into both eyes 2 (two) times daily.      . fesoterodine (TOVIAZ) 4 MG TB24 Take 4 mg by mouth daily.      . fluticasone (FLONASE) 50 MCG/ACT nasal spray Place 2 sprays into both nostrils daily.      . furosemide (LASIX) 40 MG tablet Take 1 tablet (40 mg total) by mouth daily.  90 tablet  3  . gabapentin (NEURONTIN) 100 MG capsule Take 2 tablets (200 mg total) by mouth three times daily.  180 capsule  1  . HYDROcodone-acetaminophen (NORCO/VICODIN) 5-325 MG per tablet Take 1 tablet by mouth every 6 (six) hours as needed for moderate pain.      Marland Kitchen ibuprofen (ADVIL,MOTRIN) 800 MG tablet Take 800 mg by mouth every 8 (eight) hours as needed (takes 1 tab 2-3 x week).      . metoprolol tartrate (LOPRESSOR) 25 MG tablet Take 1 tablet (  25 mg total) by mouth 2 (two) times daily.  180 tablet  3  . potassium chloride (K-DUR) 10 MEQ tablet Take 1 tablet (10 mEq total) by mouth 2 (two) times daily.  60 tablet  11  . sildenafil (VIAGRA) 100 MG tablet Take 0.5-1 tablets (50-100 mg total) by mouth daily as needed for erectile dysfunction.  7 tablet  11  . tamsulosin (FLOMAX) 0.4 MG CAPS capsule Take 0.4 mg by mouth daily.       No current facility-administered medications on file prior to visit.    PAST MEDICAL HISTORY:   Past Medical History  Diagnosis Date  . ALLERGIC RHINITIS 03/28/2009  . ANXIETY 03/28/2009  . CHOLELITHIASIS 03/28/2009  . DEGENERATIVE JOINT DISEASE 03/28/2009  . DEPRESSION 03/28/2009  . [place] DISEASE, LUMBAR 03/28/2009  . DIVERTICULOSIS, COLON 03/28/2009  . 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 FATIGUE 03/28/2009  . HEPATITIS C 03/28/2009  . HYPERLIPIDEMIA 03/28/2009  .  HYPERTENSION 03/28/2009  . LOW BACK PAIN, CHRONIC 03/28/2009  . PERIPHERAL VASCULAR DISEASE 03/28/2009  . Personal history of alcoholism 03/28/2009  . RENAL INSUFFICIENCY 03/28/2009  . THROMBOCYTOPENIA 03/28/2009  . Wheezing 02/07/2010  . CHF (congestive heart failure)     PAST SURGICAL HISTORY:   Past Surgical History  Procedure Laterality Date  . Right shoulder surgery  2003  . Cataract extraction, bilateral      SOCIAL HISTORY:   History   Social History  . Marital Status: Married    Spouse Name: N/A    Number of Children: N/A  . Years of Education: N/A   Occupational History  .      truck driver   Social History Main Topics  . Smoking status: Former Smoker -- 0.25 packs/day    Types: Cigarettes    Quit date: 03/27/1995  . Smokeless tobacco: Not on file  . Alcohol Use: No     Comment: quit drinking 1997  . Drug Use: No  . Sexual Activity: Not Currently    Partners: Female   Other Topics Concern  . Not on file   Social History Narrative  . No narrative on file    FAMILY HISTORY:   Family Status  Relation Status Death Age  . Mother Deceased     GI bleed  . Father Deceased     CA, lung  . Brother Alive     CVA  . Daughter Deceased 42    breast CA  . Daughter Alive     healthy    ROS:  A complete 10 system review of systems was obtained and was unremarkable apart from what is mentioned above.  PHYSICAL EXAMINATION:    VITALS:   Filed Vitals:   09/28/13 1329  BP: 140/82  Pulse: 80  Temp: 99.2 F (37.3 C)  Resp: 16  Height: 6' (1.829 m)    GEN:  The patient appears stated age and is in NAD. HEENT:  Normocephalic, atraumatic.  The mucous membranes are moist. The superficial temporal arteries are without ropiness or tenderness. CV:  RRR Lungs:  CTAB Neck/HEME:  There are no carotid bruits bilaterally.  Neurological examination:  Orientation: The patient is alert and oriented x3.  Relies on his wife for finer details of the history. Cranial nerves:  There is good facial symmetry.  There is facial hypomimia.  Pupils are pinpoint and minimally reactive.  Funduscopic exam is attempted but the disc margins are not well visualized.  There are no square wave jerks.  Extraocular muscles  are intact. The visual fields are full to confrontational testing. The speech is fluent and clear. Soft palate rises symmetrically and there is no tongue deviation. Hearing is intact to conversational tone. Sensation: Sensation is intact to light and pinprick throughout (facial, trunk, extremities). Vibration is intact at the bilateral big toe. There is no extinction with double simultaneous stimulation. There is no sensory dermatomal level identified. Motor: Strength is 5/5 in the right upper extremity.  In the left biceps and left deltoid, strength is at least 5-/5 but is limited by pain.  Grip strength is markedly decreased on the left.  Grip strength is normal in the right.  Manual muscle testing in the lower extremities is limited by back pain, and the patient screams out when this is tested, especially when the left leg as tested.  Strength is at least 4 minus/5 in the left lower extremity and at least 4+/5 in the right lower extremity. Deep tendon reflexes: Deep tendon reflexes are 2-/4 at the bilateral biceps, triceps, brachioradialis, patella and absent at the bilateral achilles. Plantar responses are downgoing bilaterally.  Movement examination: Tone: There is increased tone in the left upper extremity.  Tone appears to be normal in the bilateral lower extremities, but when attempting to test tone in the left lower extremity, the patient screams out in pain, stating that it causes back pain to be moved, and refuses further testing.  Tone is normal in the right upper extremity.  Abnormal movements: There is a very rare tremor that can be felt and rarely seen in the right hand.  Likewise, there is a rare tremor of the left leg. Coordination:  There is  decremation with  RAM's, seen most significantly with rapid alternating movements on the left.  This includes hand opening and closing (which he virtually cannot do), finger taps, heel taps and toe taps on the left.  Heel taps and toe taps are mildly slow on the right as well. Gait and Station: The patient cannot get out of the wheelchair on his own, nor can he get up even with maximal, two-person assistance.  ASSESSMENT/PLAN:  1.  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  -Because his marked pain complicates his examination, including hand pain on the left, and limits the amount that I can do, I really wanted to go ahead and do an MRI of the brain to make sure that I wasn't missing anything.  He refused.  -We discussed the diagnosis as well as pathophysiology of the disease.  We discussed treatment options as well as prognostic indicators.  Patient education was provided.  -Greater than 50% of the 80 minute visit was spent in counseling answering questions and talking about what to expect now as well as in the future.  We talked about medication options as well as potential future surgical options.  We talked about safety in the home.  -We decided to add carbidopa/levodopa 25/100.  1/2 tab tid x 1 wk, then 1/2 in am & noon & 1 at night for a week, then 1/2 in am &1 at noon &night for a week, then 1 po tid.  Risks, benefits, side effects and alternative therapies were discussed.  The opportunity to ask questions was given and they were answered to the best of my ability.  The patient expressed understanding and willingness to follow the outlined treatment protocols.  -He already has [place] for PT.  I want to add OT/home safety eval.  He needs a lift chair.  A  prescription was written for this.  I really cannot imagine that his wife is going to be able to continue to care for him in the home.  I could not get him out of the chair today with another person assisting me.  His wife admits that she has  to go to the neighbor on a daily basis to get him even off of the toilet. 2.  Chronic LBP  -following with Dr. Letta Pate.  This certainly limits mobility. 3.  F/u in next few months, sooner should new neuro issues arise.

## 2013-09-28 NOTE — Telephone Encounter (Signed)
Contacted patient's wife/patient to inform them that Dr. Naaman Plummer increased patient's Gabapentin to 300 mg TID for right now. Advised patient to use the RX he has, and take 3 of the 100 mg tablets 3 times a day. Ordered Gabapentin 300 mg #90 RX sent to pharmacy. Patient is aware.

## 2013-09-29 ENCOUNTER — Encounter (HOSPITAL_COMMUNITY): Payer: Self-pay | Admitting: Emergency Medicine

## 2013-09-29 ENCOUNTER — Emergency Department (HOSPITAL_COMMUNITY)
Admission: EM | Admit: 2013-09-29 | Discharge: 2013-09-29 | Disposition: A | Discharge status: Home / Self Care | Payer: Medicare Other | Attending: Emergency Medicine | Admitting: Emergency Medicine

## 2013-09-29 DIAGNOSIS — M545 Low back pain, unspecified: Secondary | ICD-10-CM | POA: Insufficient documentation

## 2013-09-29 DIAGNOSIS — M5137 Other intervertebral disc degeneration, lumbosacral region: Secondary | ICD-10-CM | POA: Insufficient documentation

## 2013-09-29 DIAGNOSIS — I10 Essential (primary) hypertension: Secondary | ICD-10-CM | POA: Insufficient documentation

## 2013-09-29 DIAGNOSIS — I509 Heart failure, unspecified: Secondary | ICD-10-CM | POA: Insufficient documentation

## 2013-09-29 DIAGNOSIS — Z8719 Personal history of other diseases of the digestive system: Secondary | ICD-10-CM | POA: Insufficient documentation

## 2013-09-29 DIAGNOSIS — G8929 Other chronic pain: Secondary | ICD-10-CM | POA: Insufficient documentation

## 2013-09-29 DIAGNOSIS — N529 Male erectile dysfunction, unspecified: Secondary | ICD-10-CM | POA: Insufficient documentation

## 2013-09-29 DIAGNOSIS — F3289 Other specified depressive episodes: Secondary | ICD-10-CM | POA: Insufficient documentation

## 2013-09-29 DIAGNOSIS — Z79899 Other long term (current) drug therapy: Secondary | ICD-10-CM | POA: Insufficient documentation

## 2013-09-29 DIAGNOSIS — Z8619 Personal history of other infectious and parasitic diseases: Secondary | ICD-10-CM | POA: Insufficient documentation

## 2013-09-29 DIAGNOSIS — Z862 Personal history of diseases of the blood and blood-forming organs and certain disorders involving the immune mechanism: Secondary | ICD-10-CM | POA: Insufficient documentation

## 2013-09-29 DIAGNOSIS — M1990 Unspecified osteoarthritis, unspecified site: Secondary | ICD-10-CM | POA: Insufficient documentation

## 2013-09-29 DIAGNOSIS — M51379 Other intervertebral disc degeneration, lumbosacral region without mention of lumbar back pain or lower extremity pain: Secondary | ICD-10-CM | POA: Insufficient documentation

## 2013-09-29 DIAGNOSIS — Z87891 Personal history of nicotine dependence: Secondary | ICD-10-CM | POA: Insufficient documentation

## 2013-09-29 DIAGNOSIS — IMO0002 Reserved for concepts with insufficient information to code with codable children: Secondary | ICD-10-CM | POA: Insufficient documentation

## 2013-09-29 DIAGNOSIS — F329 Major depressive disorder, single episode, unspecified: Secondary | ICD-10-CM | POA: Insufficient documentation

## 2013-09-29 DIAGNOSIS — Z87448 Personal history of other diseases of urinary system: Secondary | ICD-10-CM | POA: Insufficient documentation

## 2013-09-29 DIAGNOSIS — Z8639 Personal history of other endocrine, nutritional and metabolic disease: Secondary | ICD-10-CM | POA: Insufficient documentation

## 2013-09-29 DIAGNOSIS — F411 Generalized anxiety disorder: Secondary | ICD-10-CM | POA: Insufficient documentation

## 2013-09-29 MED ORDER — HYDROCODONE-ACETAMINOPHEN 5-325 MG PO TABS: 1.0000 | ORAL_TABLET | Freq: Four times a day (QID) | ORAL | Status: DC | PRN

## 2013-09-29 MED ORDER — PREDNISONE 10 MG PO TABS: 20.0000 mg | ORAL_TABLET | Freq: Two times a day (BID) | ORAL | Status: DC

## 2013-09-29 MED ORDER — HYDROMORPHONE HCL PF 1 MG/ML IJ SOLN
2.0000 mg | Freq: Once | INTRAMUSCULAR | Status: AC
  Administered 2013-09-29: 2 mg via INTRAMUSCULAR
  Filled 2013-09-29: qty 2

## 2013-09-29 NOTE — Discharge Instructions (Signed)
Hydrocodone as prescribed.  Followup with your primary Dr. and pain management specialist for further refills of your pain medication.   Back Pain, Adult Low back pain is very common. About 1 in 5 people have back pain.The cause of low back pain is rarely dangerous. The pain often gets better over time.About half of people with a sudden onset of back pain feel better in just 2 weeks. About 8 in 10 people feel better by 6 weeks.  CAUSES Some common causes of back pain include:  Strain of the muscles or ligaments supporting the spine.  Wear and tear (degeneration) of the spinal discs.  Arthritis.  Direct injury to the back. DIAGNOSIS Most of the time, the direct cause of low back pain is not known.However, back pain can be treated effectively even when the exact cause of the pain is unknown.Answering your caregiver's questions about your overall health and symptoms is one of the most accurate ways to make sure the cause of your pain is not dangerous. If your caregiver needs more information, he or she may order lab work or imaging tests (X-rays or MRIs).However, even if imaging tests show changes in your back, this usually does not require surgery. HOME CARE INSTRUCTIONS For many people, back pain returns.Since low back pain is rarely dangerous, it is often a condition that people can learn to [facility] their own.   Remain active. It is stressful on the back to sit or stand in one place. Do not sit, drive, or stand in one place for more than 30 minutes at a time. Take short walks on level surfaces as soon as pain allows.Try to increase the length of time you walk each day.  Do not stay in bed.Resting more than 1 or 2 days can delay your recovery.  Do not avoid exercise or work.Your body is made to move.It is not dangerous to be active, even though your back may hurt.Your back will likely heal faster if you return to being active before your pain is gone.  Pay attention to your  body when you bend and lift. Many people have less discomfortwhen lifting if they bend their knees, keep the load close to their bodies,and avoid twisting. Often, the most comfortable positions are those that put less stress on your recovering back.  Find a comfortable position to sleep. Use a firm mattress and lie on your side with your knees slightly bent. If you lie on your back, put a pillow under your knees.  Only take over-the-counter or prescription medicines as directed by your caregiver. Over-the-counter medicines to reduce pain and inflammation are often the most helpful.Your caregiver may prescribe muscle relaxant drugs.These medicines help dull your pain so you can more quickly return to your normal activities and healthy exercise.  Put ice on the injured area.  Put ice in a plastic bag.  Place a towel between your skin and the bag.  Leave the ice on for 15-20 minutes, 03-04 times a day for the first 2 to 3 days. After that, ice and heat may be alternated to reduce pain and spasms.  Ask your caregiver about trying back exercises and gentle massage. This may be of some benefit.  Avoid feeling anxious or stressed.Stress increases muscle tension and can worsen back pain.It is important to recognize when you are anxious or stressed and learn ways to manage it.Exercise is a great option. SEEK MEDICAL CARE IF:  You have pain that is not relieved with rest or medicine.  You  have pain that does not improve in 1 week.  You have new symptoms.  You are generally not feeling well. SEEK IMMEDIATE MEDICAL CARE IF:   You have pain that radiates from your back into your legs.  You develop new bowel or bladder control problems.  You have unusual weakness or numbness in your arms or legs.  You develop nausea or vomiting.  You develop abdominal pain.  You feel faint. Document Released: 03/12/2005 Document Revised: 09/11/2011 Document Reviewed: 07/31/2010 [facility] Patient  Information 2015 [place], [place]. This information is not intended to replace advice given to you by your health care provider. Make sure you discuss any questions you have with your health care provider.

## 2013-09-29 NOTE — ED Notes (Signed)
Pt placed on monitor upon arrival to room. Pt monitored by blood pressure and pulse ox.  

## 2013-09-29 NOTE — ED Notes (Signed)
Pt arrived from home by [facility] with c/o chronic back pain. Pain has increased in the past month, was prescribed Vicodin and PCP discontinued medication and sent to pain specialist who prescribed Gabapentin which has not been working and pain specialist is on vacation and he is here for pain control. Pt was also taking Ibuprofen but was told if he continued to take it he would need dialysis.

## 2013-09-29 NOTE — ED Provider Notes (Signed)
CSN: [id number]     Arrival date & time 09/29/13  1417 History   First MD Initiated Contact with Patient 09/29/13 1429     Chief Complaint  Patient presents with  . Back Pain     (Consider location/radiation/quality/duration/timing/severity/associated sxs/prior Treatment) HPI Comments: Patient is a 73 year old male with history of chronic low back pain. He has been treated by both his primary Dr. in pain management. His hydrocodone was recently discontinued and he was changed to gabapentin, however this has not helped his pain. He is out of his hydrocodone. He denies any new injury or trauma. He denies any radiation into his legs, weakness, or bowel or bladder complaints.  Patient is a 73 y.o. male presenting with back pain. The history is provided by the patient.  Back Pain Location:  Lumbar spine Quality:  Stabbing Radiates to:  Does not radiate Pain severity:  Severe Pain is:  Same all the time Onset quality:  Gradual Timing:  Constant Chronicity:  Chronic Context: not falling     Past Medical History  Diagnosis Date  . ALLERGIC RHINITIS 03/28/2009  . ANXIETY 03/28/2009  . CHOLELITHIASIS 03/28/2009  . DEGENERATIVE JOINT DISEASE 03/28/2009  . DEPRESSION 03/28/2009  . [place] DISEASE, LUMBAR 03/28/2009  . DIVERTICULOSIS, COLON 03/28/2009  . 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 FATIGUE 03/28/2009  . HEPATITIS C 03/28/2009  . HYPERLIPIDEMIA 03/28/2009  . HYPERTENSION 03/28/2009  . LOW BACK PAIN, CHRONIC 03/28/2009  . PERIPHERAL VASCULAR DISEASE 03/28/2009  . Personal history of alcoholism 03/28/2009  . RENAL INSUFFICIENCY 03/28/2009  . THROMBOCYTOPENIA 03/28/2009  . Wheezing 02/07/2010  . CHF (congestive heart failure)    Past Surgical History  Procedure Laterality Date  . Right shoulder surgery  2003  . Cataract extraction, bilateral     Family History  Problem Relation Age of Onset  . Lung cancer Father   . Stroke Brother   . Breast cancer Daughter    History  Substance Use Topics  .  Smoking status: Former Smoker -- 0.25 packs/day    Types: Cigarettes    Quit date: 03/27/1995  . Smokeless tobacco: Not on file  . Alcohol Use: No     Comment: quit drinking 1997    Review of Systems  Musculoskeletal: Positive for back pain.  All other systems reviewed and are negative.     Allergies  Tramadol  Home Medications   Prior to Admission medications   Medication Sig Start Date End Date Taking? Authorizing Provider  albuterol (PROVENTIL HFA;VENTOLIN HFA) 108 (90 BASE) MCG/ACT inhaler Inhale 1-2 puffs into the lungs every 6 (six) hours as needed for wheezing or shortness of breath.   Yes Historical Provider, MD  citalopram (CELEXA) 10 MG tablet Take 10 mg by mouth daily.   Yes Historical Provider, MD  clorazepate (TRANXENE) 7.5 MG tablet Take 7.5 mg by mouth 3 (three) times daily as needed for anxiety.   Yes Historical Provider, MD  cycloSPORINE (RESTASIS) 0.05 % ophthalmic emulsion Place 1 drop into both eyes 2 (two) times daily.   Yes Historical Provider, MD  fesoterodine (TOVIAZ) 4 MG TB24 Take 4 mg by mouth daily.   Yes Historical Provider, MD  fluticasone (FLONASE) 50 MCG/ACT nasal spray Place 2 sprays into both nostrils daily.   Yes Historical Provider, MD  furosemide (LASIX) 40 MG tablet Take 1 tablet (40 mg total) by mouth daily. 05/27/13  Yes [name], MD  gabapentin (NEURONTIN) 300 MG capsule Take 1 capsule (300 mg total) by mouth  3 (three) times daily. 09/28/13  Yes Meredith Staggers, MD  metoprolol tartrate (LOPRESSOR) 25 MG tablet Take 1 tablet (25 mg total) by mouth 2 (two) times daily. 07/28/13  Yes [name], MD  potassium chloride (K-DUR) 10 MEQ tablet Take 1 tablet (10 mEq total) by mouth 2 (two) times daily. 04/30/13  Yes [name], MD  tamsulosin (FLOMAX) 0.4 MG CAPS capsule Take 0.4 mg by mouth daily.   Yes Historical Provider, MD  carbidopa-levodopa (SINEMET IR) 25-100 MG per tablet Take 1 tablet by mouth 3 (three) times daily. 09/28/13   Rebecca S Tat,  DO   BP 120/67  Pulse 76  Temp(Src) 97.7 F (36.5 C) (Oral)  Resp 18  Ht 6' (1.829 m)  Wt 210 lb (95.255 kg)  BMI 28.47 kg/m2  SpO2 97% Physical Exam  Nursing note and vitals reviewed. Constitutional: He is oriented to person, place, and time. He appears well-developed and well-nourished. No distress.  HENT:  Head: Normocephalic and atraumatic.  Mouth/Throat: Oropharynx is clear and moist.  Neck: Normal range of motion.  Musculoskeletal: Normal range of motion.  There is tenderness to palpation in the soft tissues of lumbar region. There is no bony tenderness and no step-off.  Neurological: He is alert and oriented to person, place, and time.  DTRs are trace and equal in the patellar and Achilles tendons. Strength is 5 out of 5 in bilateral lower extremities.  Skin: Skin is warm and dry. He is not diaphoretic.    ED Course  Procedures (including critical care time) Labs Review Labs Reviewed - No data to display  Imaging Review No results found.   EKG Interpretation None      MDM   Final diagnoses:  None    He was given an IM injection of clotted and is feeling better. He will be discharged with a limited supply of hydrocodone which she can take until his pain management physician returns to town.  There are no red flag that would suggest an emergent cause and I see no indications for emergent imaging.    Veryl Speak, MD 09/29/13 1538

## 2013-09-30 ENCOUNTER — Telehealth: Payer: Self-pay | Admitting: Neurology

## 2013-09-30 NOTE — Telephone Encounter (Signed)
Pt's spouse called requesting to speak to a nurse regarding the instructions on how to take his meds. C/b [phone]

## 2013-09-30 NOTE — Telephone Encounter (Signed)
Instructions given for start of Caribidopa Levodopa (1/2 tab three times a day before meals x 1 wk, then 1/2 in am & noon & 1 in evening for a week, then 1/2 in am &1 at noon &one in evening for a week, then 1 tablet three times a day before meals.) They will call with any questions.

## 2013-09-30 NOTE — Telephone Encounter (Signed)
Pt's spouse called requesting to speak to a nurse regarding his meds instructions.  C/b [phone]

## 2013-10-06 ENCOUNTER — Ambulatory Visit: Payer: Medicare Other | Admitting: Internal Medicine

## 2013-10-06 ENCOUNTER — Telehealth: Payer: Self-pay

## 2013-10-06 NOTE — Telephone Encounter (Signed)
The patient was confused and some: Somnolent-last visit. Based on this am not comfortable in prescribing hydrocodone on a chronic basis. This patient has several issues including Parkinson's disease. He has severe mobility issues. He may need nursing home placement.   we can see him back in clinic perhaps he can get in with me 7/31 or sooner with Zella Ball

## 2013-10-06 NOTE — Telephone Encounter (Signed)
Gerald Gomez called on behalf of patient.  He is having increased pain.  He has gone to the neurologist and ER for this.  He was given hydrocodone and prednisone.  This helped but he is now out of medication.  He would like to know what to do?

## 2013-10-07 NOTE — Telephone Encounter (Signed)
Informed Gerald Gomez and Patient that he needs to be seen before medications can be prescribed.

## 2013-10-13 ENCOUNTER — Ambulatory Visit: Payer: Medicare Other | Admitting: Physical Medicine & Rehabilitation

## 2013-10-15 ENCOUNTER — Telehealth: Payer: Self-pay | Admitting: Neurology

## 2013-10-15 NOTE — Telephone Encounter (Signed)
Spoke with patient. He states he has been having feelings of acid reflux and he states he has woken up about 12 am and couldn't sleep. He was taking the medication at supper time. I tried to explain that it would only be in his system for four hours and probably not contributing to not sleeping. I tried to figure out how long the symptoms have been going on for when patient became very agitated. He did not want to answer anymore questions. He refused medication. I tried to see how else we could help him and he stated that he did not want help from [place] and told me to cancel all future appts.

## 2013-10-15 NOTE — Telephone Encounter (Signed)
The therapist w/ [facility] reports that pt stopped Carbidopa on Tuesday stating it caused GI upset and sleeplessness.  Please call Zigmund Daniel [email or web address]  # [phone] if any questions. / Sherri

## 2013-10-15 NOTE — Telephone Encounter (Signed)
Call patients wife.  I would doubt it would cause sleeplessness as we didn't ask him to take it close to bed and medication doesn't last more than 4 hours in system.  As for GI upset, can he take with toast or cracker?  Or is this something that he just isn't willing to take?  When he was in here, he was far more concerned about pain than PD so I am just trying to figure out what real issue is.

## 2013-10-15 NOTE — Telephone Encounter (Signed)
FYI. Please advise.

## 2013-10-19 ENCOUNTER — Encounter: Payer: Self-pay | Admitting: Physical Medicine & Rehabilitation

## 2013-10-19 ENCOUNTER — Encounter: Payer: Medicare Other | Attending: Physical Medicine & Rehabilitation

## 2013-10-19 ENCOUNTER — Ambulatory Visit (HOSPITAL_BASED_OUTPATIENT_CLINIC_OR_DEPARTMENT_OTHER): Payer: Medicare Other | Admitting: Physical Medicine & Rehabilitation

## 2013-10-19 VITALS — BP 124/61 | HR 71 | Resp 16 | Ht 72.0 in | Wt 209.0 lb

## 2013-10-19 DIAGNOSIS — G8929 Other chronic pain: Secondary | ICD-10-CM | POA: Diagnosis not present

## 2013-10-19 DIAGNOSIS — M24559 Contracture, unspecified hip: Secondary | ICD-10-CM

## 2013-10-19 DIAGNOSIS — M545 Low back pain, unspecified: Secondary | ICD-10-CM | POA: Insufficient documentation

## 2013-10-19 DIAGNOSIS — Z87891 Personal history of nicotine dependence: Secondary | ICD-10-CM | POA: Diagnosis not present

## 2013-10-19 DIAGNOSIS — I509 Heart failure, unspecified: Secondary | ICD-10-CM | POA: Diagnosis not present

## 2013-10-19 DIAGNOSIS — E785 Hyperlipidemia, unspecified: Secondary | ICD-10-CM | POA: Insufficient documentation

## 2013-10-19 DIAGNOSIS — G20A1 Parkinson's disease without dyskinesia, without mention of fluctuations: Secondary | ICD-10-CM

## 2013-10-19 DIAGNOSIS — B1920 Unspecified viral hepatitis C without hepatic coma: Secondary | ICD-10-CM | POA: Diagnosis not present

## 2013-10-19 DIAGNOSIS — R259 Unspecified abnormal involuntary movements: Secondary | ICD-10-CM | POA: Diagnosis not present

## 2013-10-19 DIAGNOSIS — I10 Essential (primary) hypertension: Secondary | ICD-10-CM | POA: Insufficient documentation

## 2013-10-19 DIAGNOSIS — M1990 Unspecified osteoarthritis, unspecified site: Secondary | ICD-10-CM | POA: Diagnosis not present

## 2013-10-19 DIAGNOSIS — M79609 Pain in unspecified limb: Secondary | ICD-10-CM | POA: Insufficient documentation

## 2013-10-19 DIAGNOSIS — G20 Parkinson's disease: Secondary | ICD-10-CM

## 2013-10-19 DIAGNOSIS — IMO0002 Reserved for concepts with insufficient information to code with codable children: Secondary | ICD-10-CM

## 2013-10-19 DIAGNOSIS — M1710 Unilateral primary osteoarthritis, unspecified knee: Secondary | ICD-10-CM

## 2013-10-19 DIAGNOSIS — M1712 Unilateral primary osteoarthritis, left knee: Secondary | ICD-10-CM

## 2013-10-19 NOTE — Progress Notes (Signed)
Subjective:    Patient ID: Gerald Gomez, male    DOB: [birth year], 73 y.o.   MRN: [id number]  HPI CC: R>L leg Pain  >40 yr hx of low back pain with increasing knee pain  Has had several falls since summer of 2014, 3 falls in may, 2015  Has been using walker for the last couple wks, no falls with walker  Was using cane to ambulating with cane for ~2 yrs prior to that  No hx of lumbar injections  Never tried gabapentin  On hydrocodone for ~1 yr  Stopped ibuprofen due to kidney disease  CT head and c spine 08/11/13 unremarkable    Pain Inventory Average Pain 5 Pain Right Now 2 My pain is sharp, stabbing and aching  In the last 24 hours, has pain interfered with the following? General activity 5 Relation with others 5 Enjoyment of life 9 What TIME of day is your pain at its worst? evening Sleep (in general) Poor  Pain is worse with: walking and standing Pain improves with: pacing activities and medication Relief from Meds: 5  Mobility use a walker how many minutes can you walk? 5 ability to climb steps?  yes do you drive?  yes  Function not employed: date last employed 2003 I need assistance with the following:  dressing, bathing and household duties  Neuro/Psych bladder control problems weakness trouble walking  Prior Studies x-rays  Physicians involved in your care Any changes since last visit?  no   Family History  Problem Relation Age of Onset  . Lung cancer Father   . Stroke Brother   . Breast cancer Daughter    History   Social History  . Marital Status: Married    Spouse Name: N/A    Number of Children: N/A  . Years of Education: N/A   Occupational History  .      truck driver   Social History Main Topics  . Smoking status: Former Smoker -- 0.25 packs/day    Types: Cigarettes    Quit date: 03/27/1995  . Smokeless tobacco: None  . Alcohol Use: No     Comment: quit drinking 1997  . Drug Use: No  . Sexual Activity: Not Currently   Partners: Female   Other Topics Concern  . None   Social History Narrative  . None   Past Surgical History  Procedure Laterality Date  . Right shoulder surgery  2003  . Cataract extraction, bilateral     Past Medical History  Diagnosis Date  . ALLERGIC RHINITIS 03/28/2009  . ANXIETY 03/28/2009  . CHOLELITHIASIS 03/28/2009  . DEGENERATIVE JOINT DISEASE 03/28/2009  . DEPRESSION 03/28/2009  . [place] DISEASE, LUMBAR 03/28/2009  . DIVERTICULOSIS, COLON 03/28/2009  . 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 FATIGUE 03/28/2009  . HEPATITIS C 03/28/2009  . HYPERLIPIDEMIA 03/28/2009  . HYPERTENSION 03/28/2009  . LOW BACK PAIN, CHRONIC 03/28/2009  . PERIPHERAL VASCULAR DISEASE 03/28/2009  . Personal history of alcoholism 03/28/2009  . RENAL INSUFFICIENCY 03/28/2009  . THROMBOCYTOPENIA 03/28/2009  . Wheezing 02/07/2010  . CHF (congestive heart failure)    BP 124/61  Pulse 71  Resp 16  Ht 6' (1.829 m)  Wt 209 lb (94.802 kg)  BMI 28.34 kg/m2  SpO2 99%  Opioid Risk Score:   Fall Risk Score: Moderate Fall Risk (6-13 points) (patient educated handout declined)   Review of Systems  Respiratory: Positive for shortness of breath.   Genitourinary: Positive for difficulty urinating.  Musculoskeletal: Positive for  back pain and gait problem.  Neurological: Positive for weakness.  All other systems reviewed and are negative.      Objective:   Physical Exam  Patient with decreased internal rotation bilateral hips Tremor in both legs with standing Masked face ease Decreased arm swing during ambulation Knee right side no evidence of effusion no tenderness palpation Knee left side no effusion no tenderness palpation  Right hip absent internal-rotation mildly reduced external rotation Left hip absent internal rotation mildly reduced external rotation      Assessment & Plan:  1.  lumbar spondylosis severe which accounts for some of his back pain. He has markedly reduced hip range of motion and this may be  contributing to his back pain. I also suspect he is significant underlying hip osteoarthritis. Patient is resistant to the idea of surgery We discussed that because of his kidney function he really cannot take any nonsteroidal anti-inflammatories. Hydrocodone has not been very helpful for him. He tried dilaudid in the emergency department and this caused excessive sedation  2. Hip contracture likely OA check x-rays Consider injection  3. 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 He feels nauseated. He will reschedule this period  4. Parkinson's disease. Appreciate neurology consultation. Patient however does not feel like he has Parkinson's disease although he cannot tell me why. I reenforced my opinion that he does indeed have this and this is contributing to his disability.

## 2013-10-19 NOTE — Progress Notes (Signed)
   Subjective:    Patient ID: Gerald Gomez, male    DOB: Mar 09, 1941, 73 y.o.   MRN: [id number]  HPI    Review of Systems     Objective:   Physical Exam        Assessment & Plan:

## 2013-10-20 ENCOUNTER — Telehealth: Payer: Self-pay | Admitting: Neurology

## 2013-10-20 NOTE — Telephone Encounter (Signed)
They were calling to r/s patient's follow up in October. I spoke with patient directly. He apologized for yelling last week and states he does want to see [place] back. He has restarted the Carbidopa Levodopa and appt r/s for December 29, 2013.

## 2013-10-20 NOTE — Telephone Encounter (Signed)
Wife called regarding a brain scan that the pt cancelled. Wants to know if it is still available. CB# [phone] / Sherri S.

## 2013-10-26 ENCOUNTER — Other Ambulatory Visit: Payer: Self-pay

## 2013-10-26 MED ORDER — GABAPENTIN 300 MG PO CAPS: 300.0000 mg | ORAL_CAPSULE | Freq: Three times a day (TID) | ORAL | Status: DC

## 2013-10-28 ENCOUNTER — Ambulatory Visit (HOSPITAL_COMMUNITY)
Admission: RE | Admit: 2013-10-28 | Discharge: 2013-10-28 | Disposition: A | Discharge status: Home / Self Care | Payer: Medicare Other | Source: Ambulatory Visit | Attending: Physical Medicine & Rehabilitation | Admitting: Physical Medicine & Rehabilitation

## 2013-10-28 DIAGNOSIS — M24559 Contracture, unspecified hip: Secondary | ICD-10-CM | POA: Insufficient documentation

## 2013-11-27 ENCOUNTER — Other Ambulatory Visit: Payer: Self-pay | Admitting: Internal Medicine

## 2013-12-01 ENCOUNTER — Ambulatory Visit (INDEPENDENT_AMBULATORY_CARE_PROVIDER_SITE_OTHER): Payer: Medicare Other | Admitting: Internal Medicine

## 2013-12-01 ENCOUNTER — Encounter: Payer: Self-pay | Admitting: Internal Medicine

## 2013-12-01 ENCOUNTER — Other Ambulatory Visit: Payer: Self-pay | Admitting: Internal Medicine

## 2013-12-01 ENCOUNTER — Other Ambulatory Visit (INDEPENDENT_AMBULATORY_CARE_PROVIDER_SITE_OTHER): Payer: Medicare Other

## 2013-12-01 VITALS — BP 108/62 | HR 85 | Temp 97.9°F | Wt 207.8 lb

## 2013-12-01 DIAGNOSIS — I10 Essential (primary) hypertension: Secondary | ICD-10-CM

## 2013-12-01 DIAGNOSIS — R7309 Other abnormal glucose: Secondary | ICD-10-CM

## 2013-12-01 DIAGNOSIS — N183 Chronic kidney disease, stage 3 unspecified: Secondary | ICD-10-CM

## 2013-12-01 DIAGNOSIS — M25569 Pain in unspecified knee: Secondary | ICD-10-CM

## 2013-12-01 DIAGNOSIS — R7302 Impaired glucose tolerance (oral): Secondary | ICD-10-CM

## 2013-12-01 DIAGNOSIS — N320 Bladder-neck obstruction: Secondary | ICD-10-CM

## 2013-12-01 DIAGNOSIS — Z23 Encounter for immunization: Secondary | ICD-10-CM

## 2013-12-01 DIAGNOSIS — M25562 Pain in left knee: Secondary | ICD-10-CM

## 2013-12-01 DIAGNOSIS — R972 Elevated prostate specific antigen [PSA]: Secondary | ICD-10-CM

## 2013-12-01 HISTORY — DX: Impaired glucose tolerance (oral): R73.02

## 2013-12-01 LAB — CBC WITH DIFFERENTIAL/PLATELET
Basophils Absolute: 0 10*3/uL (ref 0.0–0.1)
Basophils Relative: 0.4 % (ref 0.0–3.0)
EOS ABS: 0.1 10*3/uL (ref 0.0–0.7)
Eosinophils Relative: 2.6 % (ref 0.0–5.0)
HCT: 33.9 % — ABNORMAL LOW (ref 39.0–52.0)
HEMOGLOBIN: 11 g/dL — AB (ref 13.0–17.0)
LYMPHS PCT: 33.5 % (ref 12.0–46.0)
Lymphs Abs: 1.9 10*3/uL (ref 0.7–4.0)
MCHC: 32.4 g/dL (ref 30.0–36.0)
MCV: 86.4 fl (ref 78.0–100.0)
Monocytes Absolute: 0.8 10*3/uL (ref 0.1–1.0)
Monocytes Relative: 14.3 % — ABNORMAL HIGH (ref 3.0–12.0)
NEUTROS ABS: 2.8 10*3/uL (ref 1.4–7.7)
Neutrophils Relative %: 49.2 % (ref 43.0–77.0)
Platelets: 99 10*3/uL — ABNORMAL LOW (ref 150.0–400.0)
RBC: 3.92 Mil/uL — AB (ref 4.22–5.81)
RDW: 15.9 % — ABNORMAL HIGH (ref 11.5–15.5)
WBC: 5.8 10*3/uL (ref 4.0–10.5)

## 2013-12-01 LAB — BASIC METABOLIC PANEL
BUN: 51 mg/dL — ABNORMAL HIGH (ref 6–23)
CALCIUM: 9.3 mg/dL (ref 8.4–10.5)
CO2: 21 mEq/L (ref 19–32)
CREATININE: 2.3 mg/dL — AB (ref 0.4–1.5)
Chloride: 114 mEq/L — ABNORMAL HIGH (ref 96–112)
GFR: 36.25 mL/min — AB (ref >60.00)
Glucose, Bld: 106 mg/dL — ABNORMAL HIGH (ref 70–99)
Potassium: 4.1 mEq/L (ref 3.5–5.1)
Sodium: 143 mEq/L (ref 135–145)

## 2013-12-01 LAB — TSH: TSH: 1.39 u[IU]/mL (ref 0.35–4.50)

## 2013-12-01 LAB — LIPID PANEL
CHOL/HDL RATIO: 7
Cholesterol: 101 mg/dL (ref 0–200)
HDL: 15.3 mg/dL — AB (ref >39.00)
NONHDL: 85.7
Triglycerides: 477 mg/dL — ABNORMAL HIGH (ref 0.0–149.0)
VLDL: 95.4 mg/dL — ABNORMAL HIGH (ref 0.0–40.0)

## 2013-12-01 LAB — URINALYSIS, ROUTINE W REFLEX MICROSCOPIC
Bilirubin Urine: NEGATIVE
Ketones, ur: NEGATIVE
Leukocytes, UA: NEGATIVE
Nitrite: NEGATIVE
Specific Gravity, Urine: 1.015 (ref 1.000–1.030)
Urine Glucose: NEGATIVE
Urobilinogen, UA: 0.2 (ref 0.0–1.0)
pH: 6 (ref 5.0–8.0)

## 2013-12-01 LAB — PSA: PSA: 6.18 ng/mL — ABNORMAL HIGH (ref 0.10–4.00)

## 2013-12-01 LAB — HEPATIC FUNCTION PANEL
ALT: 12 U/L (ref 0–53)
AST: 51 U/L — ABNORMAL HIGH (ref 0–37)
Albumin: 3.3 g/dL — ABNORMAL LOW (ref 3.5–5.2)
Alkaline Phosphatase: 76 U/L (ref 39–117)
BILIRUBIN DIRECT: 0 mg/dL (ref 0.0–0.3)
BILIRUBIN TOTAL: 0.2 mg/dL (ref 0.2–1.2)
Total Protein: 8.1 g/dL (ref 6.0–8.3)

## 2013-12-01 LAB — LDL CHOLESTEROL, DIRECT: Direct LDL: 32.4 mg/dL

## 2013-12-01 LAB — HEMOGLOBIN A1C: Hgb A1c MFr Bld: 6.5 % (ref 4.6–6.5)

## 2013-12-01 NOTE — Assessment & Plan Note (Signed)
Has f/u with Dr Clover Mealy soon, for lab eval, volume stable Lab Results  Component Value Date   CREATININE 2.17* 08/11/2013

## 2013-12-01 NOTE — Progress Notes (Signed)
Pre visit review using our clinic review tool, if applicable. No additional management support is needed unless otherwise documented below in the visit note. 

## 2013-12-01 NOTE — Assessment & Plan Note (Signed)
Declines volt gel prn, advised to take tylneol prn, d/c ibuprofen due to ckd, for Sport med referral as well

## 2013-12-01 NOTE — Progress Notes (Signed)
Subjective:    Patient ID: Gerald Gomez, male    DOB: [birth year], 73 y.o.   MRN: [id number]  HPI  Here to f/u; overall doing ok,  Pt denies chest pain, increased sob or doe, wheezing, orthopnea, PND, increased LE swelling, palpitations, dizziness or syncope.  Pt denies polydipsia, polyuria, or low sugar symptoms such as weakness or confusion improved with po intake.  Pt denies new neurological symptoms such as new headache, or facial or extremity weakness or numbness.   Pt states overall good compliance with meds, has been trying to follow lower cholesterol diet.   Has been seeing Dr Delynn Flavin for pain - shoulder, knee, lower back, Out of wheelchair, now with cane only ., occas walker use. Takes ibuprofen 800 bidwith food only per pt initiative, does not want to change, even despite his CKD. Almost due for f/u renal soon, Dr Clover Mealy.   Gabapentin helping 300 tid for chronic back pain. Also new dx PD now on carb/levodopa. Pt continues to have recurring LBP without change in severity, bowel or bladder change, fever, wt loss,  worsening LE pain/numbness/weakness, gait change or falls. Does not like pain clinic, wants me to take over the gabapentin rx, since last visit the did not see "eye to eye".  Saw urology, no plans to return, due fo rPSA.  Had mild elev glc last visit.   Past Medical History  Diagnosis Date  . ALLERGIC RHINITIS 03/28/2009  . ANXIETY 03/28/2009  . CHOLELITHIASIS 03/28/2009  . DEGENERATIVE JOINT DISEASE 03/28/2009  . DEPRESSION 03/28/2009  . [place] DISEASE, LUMBAR 03/28/2009  . DIVERTICULOSIS, COLON 03/28/2009  . 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 FATIGUE 03/28/2009  . HEPATITIS C 03/28/2009  . HYPERLIPIDEMIA 03/28/2009  . HYPERTENSION 03/28/2009  . LOW BACK PAIN, CHRONIC 03/28/2009  . PERIPHERAL VASCULAR DISEASE 03/28/2009  . Personal history of alcoholism 03/28/2009  . RENAL INSUFFICIENCY 03/28/2009  . THROMBOCYTOPENIA 03/28/2009  . Wheezing 02/07/2010  . CHF (congestive heart failure)     Past Surgical History  Procedure Laterality Date  . Right shoulder surgery  2003  . Cataract extraction, bilateral      reports that he quit smoking about 18 years ago. His smoking use included Cigarettes. He smoked 0.25 packs per day. He does not have any smokeless tobacco history on file. He reports that he does not drink alcohol or use illicit drugs. family history includes Breast cancer in his daughter; Lung cancer in his father; Stroke in his brother. Allergies  Allergen Reactions  . Tramadol Nausea Only   Current Outpatient Prescriptions on File Prior to Visit  Medication Sig Dispense Refill  . albuterol (PROVENTIL HFA;VENTOLIN HFA) 108 (90 BASE) MCG/ACT inhaler Inhale 1-2 puffs into the lungs every 6 (six) hours as needed for wheezing or shortness of breath.      . carbidopa-levodopa (SINEMET IR) 25-100 MG per tablet Take 1 tablet by mouth 3 (three) times daily.  90 tablet  5  . citalopram (CELEXA) 10 MG tablet Take 10 mg by mouth daily.      . clorazepate (TRANXENE) 7.5 MG tablet Take 7.5 mg by mouth 3 (three) times daily as needed for anxiety.      . cycloSPORINE (RESTASIS) 0.05 % ophthalmic emulsion Place 1 drop into both eyes 2 (two) times daily.      . fluticasone (FLONASE) 50 MCG/ACT nasal spray Place 2 sprays into both nostrils daily.      . furosemide (LASIX) 40 MG tablet Take 1 tablet (40 mg  total) by mouth daily.  90 tablet  3  . gabapentin (NEURONTIN) 300 MG capsule Take 1 capsule (300 mg total) by mouth 3 (three) times daily.  90 capsule  3  . HYDROcodone-acetaminophen (NORCO) 5-325 MG per tablet Take 1-2 tablets by mouth every 6 (six) hours as needed.  20 tablet  0  . metoprolol tartrate (LOPRESSOR) 25 MG tablet Take 1 tablet (25 mg total) by mouth 2 (two) times daily.  180 tablet  3  . potassium chloride (K-DUR) 10 MEQ tablet Take 1 tablet (10 mEq total) by mouth 2 (two) times daily.  60 tablet  11  . potassium chloride (K-DUR,KLOR-CON) 10 MEQ tablet       .  tamsulosin (FLOMAX) 0.4 MG CAPS capsule Take 0.4 mg by mouth daily.      . tamsulosin (FLOMAX) 0.4 MG CAPS capsule take 1 capsule by mouth once daily  90 capsule  3   No current facility-administered medications on file prior to visit.    Review of Systems  Constitutional: Negative for unusual diaphoresis or other sweats  HENT: Negative for ringing in ear Eyes: Negative for double vision or worsening visual disturbance.  Respiratory: Negative for choking and stridor.   Gastrointestinal: Negative for vomiting or other signifcant bowel change Genitourinary: Negative for hematuria or decreased urine volume.  Musculoskeletal: Negative for other MSK pain or swelling Skin: Negative for color change and worsening wound.  Neurological: Negative for tremors and numbness other than noted  Psychiatric/Behavioral: Negative for decreased concentration or agitation other than above       Objective:   Physical Exam BP 108/62  Pulse 85  Temp(Src) 97.9 F (36.6 C) (Oral)  Wt 207 lb 12 oz (94.235 kg)  SpO2 96% VS noted,  Constitutional: Pt appears well-developed, well-nourished.  HENT: Head: NCAT.  Right Ear: External ear normal.  Left Ear: External ear normal.  Eyes: . Pupils are equal, round, and reactive to light. Conjunctivae and EOM are normal Neck: Normal range of motion. Neck supple.  Cardiovascular: Normal rate and regular rhythm.   Pulmonary/Chest: Effort normal and breath sounds normal.  Abd:  Soft, NT, ND, + BS Neurological: Pt is alert. Not confused , motor grossly intact Bilat knee warm, small effusion, crepitus Skin: Skin is warm. No rash Psychiatric: Pt behavior is normal. No agitation.     Assessment & Plan:

## 2013-12-01 NOTE — Assessment & Plan Note (Signed)
stable overall by history and exam, recent data reviewed with pt, and pt to continue medical treatment as before,  to f/u any worsening symptoms or concerns BP Readings from Last 3 Encounters:  12/01/13 108/62  10/19/13 124/61  09/29/13 127/69

## 2013-12-01 NOTE — Assessment & Plan Note (Signed)
Also for psa as he is due 

## 2013-12-01 NOTE — Patient Instructions (Addendum)
You had the flu shot today  Please stop the ibuprofen, as this may hurt your kidneys  You will be contacted regarding the referral for: Dr Tamala Julian for your knee pain  Please continue all other medications as before, and refills have been done if requested.  Please have the pharmacy call with any other refills you may need.  Please continue your efforts at being more active, low cholesterol diet, and weight control.  You are otherwise up to date with prevention measures today.  Please keep your appointments with your specialists as you may have planned  Please go to the LAB in the Basement (turn left off the elevator) for the tests to be done today  You will be contacted by phone if any changes need to be made immediately.  Otherwise, you will receive a letter about your results with an explanation, but please check with MyChart first.  Please remember to sign up for MyChart if you have not done so, as this will be important to you in the future with finding out test results, communicating by private email, and scheduling acute appointments online when needed.  Please return in 6 months, or sooner if needed

## 2013-12-03 ENCOUNTER — Telehealth: Payer: Self-pay | Admitting: Family Medicine

## 2013-12-03 ENCOUNTER — Ambulatory Visit: Payer: Medicare Other | Admitting: Physical Medicine & Rehabilitation

## 2013-12-03 NOTE — Telephone Encounter (Signed)
Patient returning phone call 

## 2013-12-21 ENCOUNTER — Ambulatory Visit (INDEPENDENT_AMBULATORY_CARE_PROVIDER_SITE_OTHER): Payer: Medicare Other | Admitting: Family Medicine

## 2013-12-21 ENCOUNTER — Encounter: Payer: Self-pay | Admitting: Family Medicine

## 2013-12-21 VITALS — BP 108/60 | HR 62 | Ht 72.0 in | Wt 206.0 lb

## 2013-12-21 DIAGNOSIS — G8929 Other chronic pain: Secondary | ICD-10-CM

## 2013-12-21 DIAGNOSIS — S63601A Unspecified sprain of right thumb, initial encounter: Secondary | ICD-10-CM | POA: Insufficient documentation

## 2013-12-21 DIAGNOSIS — M2550 Pain in unspecified joint: Secondary | ICD-10-CM

## 2013-12-21 DIAGNOSIS — M545 Low back pain, unspecified: Secondary | ICD-10-CM

## 2013-12-21 DIAGNOSIS — S6390XA Sprain of unspecified part of unspecified wrist and hand, initial encounter: Secondary | ICD-10-CM

## 2013-12-21 NOTE — Assessment & Plan Note (Signed)
Wanted to discuss further with patient. Patient to States that he is has a good relationship with the position was treated him.

## 2013-12-21 NOTE — Progress Notes (Signed)
  [name] [address] [place], [place] [address] Phone: [phone] Subjective:    I'm seeing this patient by the request  of:  [name], MD   CC: Multiple joint pains right thumb pain being the worse  [id number] Gerald Gomez is a 73 y.o. male coming in with complaint of multiple joint pains. Patient states that he was seen for knee pain previously and was referred here for that but states that that has improved. Patient does have a history of degenerative back pain that he see another provider for as well he states. Patient states that he is aches and pains over time as well as the right thumb pain being the worst. Patient has been falling more than usual and he may have braced himself with his. Denies any swelling or numbness. States it hurts more at the tip of his thumb. Denies any fevers or chills or any abnormal weight loss. Denies any numbness. Still able to do activities but states they can be very sore overall. Rate the severity of all his pain is 7/10. Patient states that he has to ambulate with a walker secondary to the pains. Patient has tried many different modalities he states over the years. Unable to work out more religiously secondary to pain.   X-rays were reviewed and interpreted by me today. Patient's bilateral knee x-rays show mild to moderate osteophytic changes. She'll have x-rays of his hip recently as well. Patient's hip x-ray shows some mild osteophytic changes. Patient's lumbar back had x-rays back in May of 2015 showing severe L4-L5 and L5-S1 facet hypertrophy    Past medical history, social, surgical and family history all reviewed in electronic medical record.   Review of Systems: No headache, visual changes, nausea, vomiting, diarrhea, constipation, dizziness, abdominal pain, skin rash, fevers, chills, night sweats, weight loss, swollen lymph nodes, body aches, joint swelling, muscle aches, chest pain, shortness of breath,  mood changes.   Objective Blood pressure 108/60, pulse 62, height 6' (1.829 m), weight 206 lb (93.441 kg), SpO2 94.00%.  General: No apparent distress alert and oriented x3 mood and affect normal, dressed appropriately.  HEENT: Pupils equal, extraocular movements intact  Respiratory: Patient's speak in full sentences and does not appear short of breath  Cardiovascular: No lower extremity edema, non tender, no erythema  Skin: Warm dry intact with no signs of infection or rash on extremities or on axial skeleton.  Abdomen: Soft nontender  Neuro: Cranial nerves II through XII are intact, neurovascularly intact in all extremities with 2+ DTRs and 2+ pulses.  Lymph: No lymphadenopathy of posterior or anterior cervical chain or axillae bilaterally.  Gait and ambulates with a walker with severe kyphosis. MSK:  Non tender with full range of motion and good stability and symmetric strength and tone of shoulders, elbows, wrist, hip, knee and ankles bilaterally. Double osteophytic changes of multiple joints. Distress right thumb has full movement. Maybe some mild laxity of the anterior cruciate ligament but no significant pain with testing. Neurovascularly intact distally with full range of motion. Patient does have some mild cogwheeling at baseline.    Impression and Recommendations:     This case required medical decision making of moderate complexity.

## 2013-12-21 NOTE — Patient Instructions (Signed)
Good to see you Gerald Gomez is your friend. Ice 20 minutes 2 times daily. Usually after activity and before bed. Wear brace at least nightly.  Exercises 3 times a week.  Take tylenol 650 mg three times a day is the best evidence based medicine we have for arthritis.  Glucosamine sulfate 750mg  twice a day is a supplement that has been shown to help moderate to severe arthritis. Vitamin D 2000 IU daily Fish oil 2 grams daily.  Tumeric 500mg  twice daily.  Capsaicin topically up to four times a day may also help with pain. Cortisone injections are an option if these interventions do not seem to make a difference or need more relief.  If cortisone injections do not help, there are different types of shots that may help but they take longer to take effect.  We can discuss this at follow up.  It's important that you continue to stay active.  Water aerobics and cycling with low resistance are the best two types of exercise for arthritis. Come back and see me in 3 weeks.

## 2013-12-21 NOTE — Assessment & Plan Note (Signed)
Patient does have a complaint of numerous different joint pains they can hurt him. We discussed that patient's comorbidities definitely increase the likelihood of him having pain as well as he has degenerative disc disease and chronic kidney disease it is going to advance patient's osteoarthritic changes. Patient will try conservative measures and was given home exercise program, icing protocol, discussed over-the-counter medications he could be beneficial. Patient will followup and see me again in 3-4 weeks for further evaluation and treatment. I think he would respond well to formal physical therapy for based on patient's demeanor he will likely declined.

## 2013-12-21 NOTE — Assessment & Plan Note (Signed)
Patient does have a spurring right thumb. Patient was given a brace was fitted by me today. We discussed an icing regimen I think would be beneficial. We discussed home exercise program and was given a handout. Patient will try these interventions and come back and see me again

## 2013-12-29 ENCOUNTER — Ambulatory Visit (INDEPENDENT_AMBULATORY_CARE_PROVIDER_SITE_OTHER): Payer: Medicare Other | Admitting: Neurology

## 2013-12-29 ENCOUNTER — Encounter: Payer: Self-pay | Admitting: Neurology

## 2013-12-29 ENCOUNTER — Ambulatory Visit: Payer: Medicare Other | Admitting: Neurology

## 2013-12-29 VITALS — BP 120/52 | HR 68 | Ht 72.5 in | Wt 206.0 lb

## 2013-12-29 DIAGNOSIS — N183 Chronic kidney disease, stage 3 unspecified: Secondary | ICD-10-CM

## 2013-12-29 DIAGNOSIS — M545 Low back pain, unspecified: Secondary | ICD-10-CM

## 2013-12-29 DIAGNOSIS — G20 Parkinson's disease: Secondary | ICD-10-CM

## 2013-12-29 DIAGNOSIS — R110 Nausea: Secondary | ICD-10-CM

## 2013-12-29 NOTE — Progress Notes (Signed)
Gerald Gomez was seen today in the movement disorders clinic for neurologic consultation at the request of Dr. Letta Pate.  His PCP is [name], MD.  The consultation is for the evaluation of gait instability out of proportion to the degenerative changes in his back.  Pt is accompanied by his wife, who supplements the history.  Pt has had difficulty walking for about 1.5 years.  His wife reports that he began to fall.  Some of the falls were associated with acute CHF.  He was hospitalized in May for falls.  He was had gotten up to go to the kitchen and had fallen on the floor and could not get off the floor.  There was no LOC.  He is now receiving in home PT.  His wife is not sure if he is getting stronger or not.  She is requiring help to get him to stand.    12/29/13 update:  Pt returning for f/u.  Pt is on carbidopa/levodopa 25/100 bid even though he is to take it tid; he states that it made his stomach upset.  He had started it and then stopped it because he was convinced he didn't have PD and then his wife convinced him to restart it and follow up here.  He is out of the wheel chair; he says that he isn't doing better from the carbidopa/levodopa 25/100 but rather from his ibuprofen which took away "that inflammation from my spinal cord."  He is no longer falling and says that is from the ibuprofen.  He admits that his tremor is gone that he previously had.    PREVIOUS MEDICATIONS: none to date  ALLERGIES:   Allergies  Allergen Reactions  . Tramadol Nausea Only    CURRENT MEDICATIONS:  Current Outpatient Prescriptions on File Prior to Visit  Medication Sig Dispense Refill  . albuterol (PROVENTIL HFA;VENTOLIN HFA) 108 (90 BASE) MCG/ACT inhaler Inhale 1-2 puffs into the lungs every 6 (six) hours as needed for wheezing or shortness of breath.      . carbidopa-levodopa (SINEMET IR) 25-100 MG per tablet Take 1 tablet by mouth 3 (three) times daily.  90 tablet  5  . citalopram (CELEXA) 10 MG  tablet Take 10 mg by mouth daily.      . clorazepate (TRANXENE) 7.5 MG tablet Take 7.5 mg by mouth 3 (three) times daily as needed for anxiety.      . cycloSPORINE (RESTASIS) 0.05 % ophthalmic emulsion Place 1 drop into both eyes 2 (two) times daily.      . fluticasone (FLONASE) 50 MCG/ACT nasal spray Place 2 sprays into both nostrils daily.      . furosemide (LASIX) 40 MG tablet Take 1 tablet (40 mg total) by mouth daily.  90 tablet  3  . gabapentin (NEURONTIN) 300 MG capsule Take 1 capsule (300 mg total) by mouth 3 (three) times daily.  90 capsule  3  . ibuprofen (ADVIL,MOTRIN) 800 MG tablet Take 800 mg by mouth 2 (two) times daily.      . metoprolol tartrate (LOPRESSOR) 25 MG tablet Take 1 tablet (25 mg total) by mouth 2 (two) times daily.  180 tablet  3  . potassium chloride (K-DUR) 10 MEQ tablet Take 1 tablet (10 mEq total) by mouth 2 (two) times daily.  60 tablet  11  . tamsulosin (FLOMAX) 0.4 MG CAPS capsule take 1 capsule by mouth once daily  90 capsule  3   No current facility-administered medications on file  prior to visit.    PAST MEDICAL HISTORY:   Past Medical History  Diagnosis Date  . ALLERGIC RHINITIS 03/28/2009  . ANXIETY 03/28/2009  . CHOLELITHIASIS 03/28/2009  . DEGENERATIVE JOINT DISEASE 03/28/2009  . DEPRESSION 03/28/2009  . 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 DIVERTICULOSIS, COLON 03/28/2009  . 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 FATIGUE 03/28/2009  . HEPATITIS C 03/28/2009  . HYPERLIPIDEMIA 03/28/2009  . HYPERTENSION 03/28/2009  . LOW BACK PAIN, CHRONIC 03/28/2009  . PERIPHERAL VASCULAR DISEASE 03/28/2009  . Personal history of alcoholism 03/28/2009  . RENAL INSUFFICIENCY 03/28/2009  . THROMBOCYTOPENIA 03/28/2009  . Wheezing 02/07/2010  . CHF (congestive heart failure)   . Impaired glucose tolerance 12/01/2013    PAST SURGICAL HISTORY:   Past Surgical History  Procedure Laterality Date  . Right shoulder surgery  2003  . Cataract extraction, bilateral      SOCIAL HISTORY:     History   Social History  . Marital Status: Married    Spouse Name: N/A    Number of Children: N/A  . Years of Education: N/A   Occupational History  .      truck driver   Social History Main Topics  . Smoking status: Former Smoker -- 0.25 packs/day    Types: Cigarettes    Quit date: 03/27/1995  . Smokeless tobacco: Not on file  . Alcohol Use: No     Comment: quit drinking 1997  . Drug Use: No  . Sexual Activity: Not Currently    Partners: Female   Other Topics Concern  . Not on file   Social History Narrative  . No narrative on file    FAMILY HISTORY:   Family Status  Relation Status Death Age  . Mother Deceased     GI bleed  . Father Deceased     CA, lung  . Brother Alive     CVA  . Daughter Deceased 28    breast CA  . Daughter Alive     healthy    ROS:  A complete 10 system review of systems was obtained and was unremarkable apart from what is mentioned above.  PHYSICAL EXAMINATION:    VITALS:   Filed Vitals:   12/29/13 1304  BP: 120/52  Pulse: 68  Height: 6' 0.5" (1.842 m)  Weight: 206 lb (93.441 kg)    GEN:  The patient appears stated age and is in NAD. HEENT:  Normocephalic, atraumatic.  The mucous membranes are moist. The superficial temporal arteries are without ropiness or tenderness. CV:  RRR Lungs:  CTAB Neck/HEME:  There are no carotid bruits bilaterally.  Neurological examination:  Orientation: The patient is alert and oriented x3.  Relies on his wife for finer details of the history. Cranial nerves: There is good facial symmetry.  There is facial hypomimia.  Pupils are pinpoint and minimally reactive.  Funduscopic exam is attempted but the disc margins are not well visualized.  There are no square wave jerks.  Extraocular muscles are intact. The visual fields are full to confrontational testing. The speech is fluent and clear. Soft palate rises symmetrically and there is no tongue deviation. Hearing is intact to conversational  tone. Sensation: Sensation is intact to light touch throughout. Motor: Strength is at 5/5 in the UE/LE but grip strength is reduced bilaterally..   Movement examination: Tone: There is mildly increased on the right upper extremity and normal on the left.  Tone is normal in the bilateral lower extremities.  Abnormal movements: There  is no tremor noted today. Coordination:  There is no significant decremation with rapid alternating movements today. Gait and Station: The patient arises out of the chair by pushing off the chair.  He steps a few short steps on his own, but once she grabs the walker, he ambulates very well.  He is stooped.  ASSESSMENT/PLAN:  1.  Idiopathic Parkinson's disease, newly diagnosed 09/28/13.  -Pt looks markedly better on carbidopa/levodopa 25/100 but he attributes his success (out of the wheelchair and lack of tremor and no longer falls) to ibuprofen.  Explained to patient that while ibuprofen could help his pain it would not cause tremor improvement.    -The patient is having some nausea, and while levodopa certainly could cause this, I also warned him about the ibuprofen causing GI upset and even GI bleeding.  I also talked to him about renal failure associated with this medication, which he has had in the past.  He states that he is having nephrology watch his kidneys closely.  In regards to the levodopa, I told him he could take this with a carbohydrate, but to avoid taking it with a protein.  -I. did encourage safe, cardiovascular exercise. 2.  Chronic LBP  -This is markedly improved. 3.  F/u in next few months, sooner should new neuro issues arise.

## 2014-01-12 ENCOUNTER — Other Ambulatory Visit (INDEPENDENT_AMBULATORY_CARE_PROVIDER_SITE_OTHER): Payer: Medicare Other

## 2014-01-12 ENCOUNTER — Encounter: Payer: Self-pay | Admitting: Family Medicine

## 2014-01-12 ENCOUNTER — Ambulatory Visit (INDEPENDENT_AMBULATORY_CARE_PROVIDER_SITE_OTHER): Payer: Medicare Other | Admitting: Family Medicine

## 2014-01-12 VITALS — BP 118/62 | HR 82 | Ht 72.5 in | Wt 207.0 lb

## 2014-01-12 DIAGNOSIS — S63601D Unspecified sprain of right thumb, subsequent encounter: Secondary | ICD-10-CM

## 2014-01-12 DIAGNOSIS — M79644 Pain in right finger(s): Secondary | ICD-10-CM

## 2014-01-12 DIAGNOSIS — M2550 Pain in unspecified joint: Secondary | ICD-10-CM

## 2014-01-12 NOTE — Assessment & Plan Note (Signed)
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 Patient was given a intra-articular injection under ultrasound guidance today. Am hoping that this will be beneficial. I think a corticosteroid injection and see her then medications secondary to this patient's comorbidities. We discussed patient getting off the oral anti-inflammatories and how this would be better for his kidneys. Patient was given a trial size of topical anti-inflammatory which likely will have decreased risk of having a renal or GI discomfort. Patient will try this and if makes improvement then we will have him start topical and give him a prescription. We discussed the icing protocol and continuing the exercises. Patient will come back and see me again in 4 weeks to make sure he continues to improve.

## 2014-01-12 NOTE — Assessment & Plan Note (Signed)
Continues to have significant arthralgias that likely secondary to the hypertonicity of his Parkinson's. Discussed with patient that this likely will be a chronic issue. We discussed though that the over-the-counter medication can make some improvement in standing active will be crucial. Patient states that he did notice some improvement since starting the vitamin D supplementation but I did not want to go up any higher than we are at this time. Discussed icing regimen. We discussed the possibility of formal physical therapy which patient declined. Patient is ambulating better than last time. We'll see patient will continue to do well and we'll have him follow up with me again in 1-2 months for further evaluation and treatment.  Spent greater than 25 minutes with patient face-to-face and had greater than 50% of counseling including as described above in assessment and plan.

## 2014-01-12 NOTE — Patient Instructions (Signed)
Good to see you as always.  Pennsaid twice daily (pinkie size) where you hurt.  Stop the ibuprofen.  If you like it call me and I will get you a prescription.  Ice is your friend Continue the exercises for you hand.  Wear gloves at night See me again in 4 weeks.

## 2014-01-12 NOTE — Progress Notes (Signed)
[name] [address], [place] [address] Phone: [phone] Subjective:     CC: Multiple joint pains right thumb pain being the worse follow up  [id number] JOHNRYAN SAO is a 73 y.o. male coming in with complaint of multiple joint pains. Patient's past medical history significant for chronic kidney disease as well as Parkinson's disease. Patient has seen neurology on a regular basis. Patient elected do conservative therapy with over-the-counter medications. Patient has been taking ibuprofen fairly often and hasbeen able to ambulate more than he has in quite some time. We have discussed previously trying to avoid this medication secondary to his chronic kidney disease. Patient to states that his low back pain is significantly better than the last exam. Patient did get vitamin D and has been taking his daily. Patient has noticed some mild improvement in his daily aches and pains. Patient is noticing been walking a little bit better overall.  Patient continues to to have right thumb pain. It is worse when he is trying to push himself up from a seated position. States that unfortunately it feels like it is getting worse. States that he notices a grinding sensation as well. Denies any numbness. Patient is right handed and this is affecting his daily activities.   X-rays were reviewed and interpreted by me today. Patient's bilateral knee x-rays show mild to moderate osteophytic changes. She'll have x-rays of his hip recently as well. Patient's hip x-ray shows some mild osteophytic changes. Patient's lumbar back had x-rays back in May of 2015 showing severe L4-L5 and L5-S1 facet hypertrophy    Past medical history, social, surgical and family history all reviewed in electronic medical record.   Review of Systems: No headache, visual changes, nausea, vomiting, diarrhea, constipation, dizziness, abdominal pain, skin rash, fevers, chills, night sweats, weight  loss, swollen lymph nodes, body aches, joint swelling, muscle aches, chest pain, shortness of breath, mood changes.   Objective Blood pressure 118/62, pulse 82, height 6' 0.5" (1.842 m), weight 207 lb (93.895 kg), SpO2 96.00%.  General: No apparent distress alert and oriented x3 mood and affect normal, dressed appropriately.  HEENT: Pupils equal, extraocular movements intact  Respiratory: Patient's speak in full sentences and does not appear short of breath  Cardiovascular: No lower extremity edema, non tender, no erythema  Skin: Warm dry intact with no signs of infection or rash on extremities or on axial skeleton.  Abdomen: Soft nontender  Neuro: Cranial nerves II through XII are intact, neurovascularly intact in all extremities with 2+ DTRs and 2+ pulses.  Lymph: No lymphadenopathy of posterior or anterior cervical chain or axillae bilaterally.  Gait and ambulates with a walker with severe kyphosis. MSK:  Non tender with full range of motion and good stability and symmetric strength and tone of shoulders, elbows, wrist, hip, knee and ankles bilaterally.   right thumb has full movement. Maybe some mild laxity of the ulnar collateral ligament but no significant pain with testing. Positive grind test Neurovascularly intact distally with full range of motion. Patient does have some mild cogwheeling at baseline.  Muscular skeletal ultrasound was performed and interpreted by Hulan Saas, M  Limited ultrasound of the right [facility] joint shows mild to moderate arthritic changes. Impression: CMC arthritis  After verbal consent patient was prepped with alcohol swabs and with a 25-gauge 1 inch needle was injected with 0.5 cc of 0.5% Marcaine and 0.5 cc of Kenalog 40 mg/dL into the [facility] joint under ultrasound guidance. Patient  tolerated procedure well with no blood loss. Post injection instructions given.    Impression and Recommendations:     This case required medical decision making of moderate  complexity.

## 2014-02-09 ENCOUNTER — Ambulatory Visit: Payer: Medicare Other | Admitting: Family Medicine

## 2014-02-15 ENCOUNTER — Encounter: Payer: Self-pay | Admitting: Family Medicine

## 2014-02-15 ENCOUNTER — Ambulatory Visit (INDEPENDENT_AMBULATORY_CARE_PROVIDER_SITE_OTHER): Payer: Medicare Other | Admitting: Family Medicine

## 2014-02-15 VITALS — BP 140/72 | HR 90 | Ht 72.5 in | Wt 208.0 lb

## 2014-02-15 DIAGNOSIS — M19019 Primary osteoarthritis, unspecified shoulder: Secondary | ICD-10-CM | POA: Insufficient documentation

## 2014-02-15 DIAGNOSIS — G20 Parkinson's disease: Secondary | ICD-10-CM

## 2014-02-15 DIAGNOSIS — M129 Arthropathy, unspecified: Secondary | ICD-10-CM

## 2014-02-15 DIAGNOSIS — G20A1 Parkinson's disease without dyskinesia, without mention of fluctuations: Secondary | ICD-10-CM

## 2014-02-15 MED ORDER — HYDROCORTISONE 2.5 % EX OINT: TOPICAL_OINTMENT | CUTANEOUS | Status: DC

## 2014-02-15 NOTE — Patient Instructions (Signed)
Good to see you For your hands try the cream twice daily to help with dry skin Wear the brace at night Home exercises for the finger most night of the week.  For the shoulder pennsaid topically up to 2 times daily.  Ice is your friend Exercises 3 times a week See me again in 6 weeks.

## 2014-02-15 NOTE — Assessment & Plan Note (Signed)
I do believe that patient's finger contraction is unfortunately secondary to his Parkinson's disease. Patient is having a mild resting tremor that can also be potentially worsening of his disease. Patient is following up in the next 2 months with his neurologist. Patient was given a brace to try to wear at night. We will monitor an have patient come back in 3 weeks for further evaluation.

## 2014-02-15 NOTE — Progress Notes (Signed)
[facility] [address], [place] [address] Phone: [phone] Subjective:     CC: Multiple joint pains right thumb pain being the worse follow up  [id number] Gerald Gomez is a 73 y.o. male coming in with complaint of multiple joint pains. Patient's past medical history significant for chronic kidney disease as well as Parkinson's disease. Patient has seen neurology on a regular basis.     Patient continues to to have right thumb pain. Patient though states that he has improved some since he injection.  Patient was having more difficult he with his right index finger. States that it wants to be flexed and has difficulty with actually extending into the day. Patient states his long as he moves it it seems to do well. Denies any clicking no such as a trigger finger. Patient states that this is very uncomfortable.  Patient is also complaining of left shoulder pain. States this seems to be on the top of the shoulder. States it's recently Worse. 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 Patient states that it gives him significant multi-even when he is trying to change his clothes. Denies any radiation of pain or any numbness. An states that hurts to touch sometimes. Denies any fevers or chills.   X-rays were reviewed and interpreted by me today. Patient's bilateral knee x-rays show mild to moderate osteophytic changes. She'll have x-rays of his hip recently as well. Patient's hip x-ray shows some mild osteophytic changes. Patient's lumbar back had x-rays back in May of 2015 showing severe L4-L5 and L5-S1 facet hypertrophy    Past medical history, social, surgical and family history all reviewed in electronic medical record.   Review of Systems: No headache, visual changes, nausea, vomiting, diarrhea, constipation, dizziness, abdominal pain, skin rash, fevers, chills, night sweats, weight loss, swollen lymph nodes, body aches, joint  swelling, muscle aches, chest pain, shortness of breath, mood changes.   Objective Blood pressure 140/72, pulse 90, height 6' 0.5" (1.842 m), weight 208 lb (94.348 kg), SpO2 92 %.  General: No apparent distress alert and oriented x3 mood and affect normal, dressed appropriately. Masked facies HEENT: Pupils equal, extraocular movements intact  Respiratory: Patient's speak in full sentences and does not appear short of breath  Cardiovascular: No lower extremity edema, non tender, no erythema  Skin: Warm dry intact with no signs of infection or rash on extremities or on axial skeleton.  Abdomen: Soft nontender  Neuro: Cranial nerves II through XII are intact, neurovascularly intact in all extremities with 2+ DTRs and 2+ pulses.  Lymph: No lymphadenopathy of posterior or anterior cervical chain or axillae bilaterally.  Gait and ambulates with a walker with severe kyphosis. MSK:  Non tender with full range of motion and good stability and symmetric strength and tone of , elbows, wrist, hip, knee and ankles bilaterally.  Mild resting tremor noted right index finger does seem to be tight compared to the other ones but does have full passive range of motion.  Shoulder: Left Inspection reveals no abnormalities, atrophy or asymmetry. Palpation reveals tenderness over the left AC joint ROM is full in all planes. Rotator cuff strength normal throughout.   signs of impingement with positive Neer and Hawkin's tests, empty can sign. Speeds and Yergason's tests normal. No labral pathology noted with negative Obrien's, negative clunk and good stability. Normal scapular function observed. No painful arc and no drop arm sign. No apprehension sign Contralateral shoulder unremarkable  After verbal consent patient was prepped with alcohol swab and with a 25-gauge 1 inch needle was injected into the before meals joint with 0.5 mL of 0.5% Marcaine and 0.5 mL of Kenalog 40 g/dL. Patient tolerated the procedure  well with minimal benefit.    Impression and Recommendations:     This case required medical decision making of moderate complexity.

## 2014-02-15 NOTE — Assessment & Plan Note (Signed)
Discussed with patient at great length. We discussed different treatment options. I do feel that patient injection was the safest possibility. Patient was given a very small prescription of topical anti-inflammatories to try. Patient will try this as well as home exercises that was given. Patient continues to have difficulty x-rays will be necessary but I do not think anything will be learned from this. He does have degenerative joint disease.

## 2014-02-16 ENCOUNTER — Encounter: Payer: Self-pay | Admitting: Internal Medicine

## 2014-02-16 ENCOUNTER — Ambulatory Visit (INDEPENDENT_AMBULATORY_CARE_PROVIDER_SITE_OTHER): Payer: Medicare Other | Admitting: Internal Medicine

## 2014-02-16 ENCOUNTER — Ambulatory Visit (INDEPENDENT_AMBULATORY_CARE_PROVIDER_SITE_OTHER)
Admission: RE | Admit: 2014-02-16 | Discharge: 2014-02-16 | Disposition: A | Discharge status: Home / Self Care | Payer: Medicare Other | Source: Ambulatory Visit | Attending: Internal Medicine | Admitting: Internal Medicine

## 2014-02-16 VITALS — BP 118/62 | HR 79 | Temp 98.4°F | Ht 72.0 in | Wt 204.5 lb

## 2014-02-16 DIAGNOSIS — R05 Cough: Secondary | ICD-10-CM

## 2014-02-16 DIAGNOSIS — R059 Cough, unspecified: Secondary | ICD-10-CM

## 2014-02-16 DIAGNOSIS — K219 Gastro-esophageal reflux disease without esophagitis: Secondary | ICD-10-CM

## 2014-02-16 DIAGNOSIS — M545 Low back pain: Secondary | ICD-10-CM

## 2014-02-16 MED ORDER — GABAPENTIN 100 MG PO CAPS: ORAL_CAPSULE | ORAL | Status: DC

## 2014-02-16 MED ORDER — PANTOPRAZOLE SODIUM 40 MG PO TBEC: 40.0000 mg | DELAYED_RELEASE_TABLET | Freq: Every day | ORAL | Status: DC

## 2014-02-16 NOTE — Patient Instructions (Signed)
Please take all new medication as prescribed - the protonix (generic)  Please go to the XRAY Department in the Basement (go straight as you get off the elevator) for the x-ray testing  You will be contacted by phone if any changes need to be made immediately.  Otherwise, you will receive a letter about your results with an explanation, but please check with MyChart first.  Please remember to sign up for MyChart if you have not done so, as this will be important to you in the future with finding out test results, communicating by private email, and scheduling acute appointments online when needed.  Please continue all other medications as before, and refills have been done if requested - the gabapentin  Please have the pharmacy call with any other refills you may need.  Please continue your efforts at being more active, low cholesterol diet, and weight control.  Please keep your appointments with your specialists as you may have planned  No other Lab work felt needed today

## 2014-02-16 NOTE — Progress Notes (Signed)
Subjective:    Patient ID: Gerald Gomez, male    DOB: 1940/07/16, 73 y.o.   MRN: [id number]  HPI  Here with persistent but moderate to severe scant prod cough with wife, x 2 mo, very hard at times assoc with nausea and near vomit, clear sputum, no fever, and Pt denies chest pain, increased sob or doe, wheezing, orthopnea, PND, increased LE swelling, palpitations, dizziness or syncope. Not improved with zyrtec/flonase.  Does have recurring GERD symtpoms. bUt Denies worsening abd pain, dysphagia, n/v, bowel change or blood.  Pt continues to have recurring LBP without change in severity, bowel or bladder change, fever, wt loss,  worsening LE pain/numbness/weakness, gait change or falls. Past Medical History  Diagnosis Date  . ALLERGIC RHINITIS 03/28/2009  . ANXIETY 03/28/2009  . CHOLELITHIASIS 03/28/2009  . DEGENERATIVE JOINT DISEASE 03/28/2009  . DEPRESSION 03/28/2009  . [place] DISEASE, LUMBAR 03/28/2009  . DIVERTICULOSIS, COLON 03/28/2009  . 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 FATIGUE 03/28/2009  . HEPATITIS C 03/28/2009  . HYPERLIPIDEMIA 03/28/2009  . HYPERTENSION 03/28/2009  . LOW BACK PAIN, CHRONIC 03/28/2009  . PERIPHERAL VASCULAR DISEASE 03/28/2009  . Personal history of alcoholism 03/28/2009  . RENAL INSUFFICIENCY 03/28/2009  . THROMBOCYTOPENIA 03/28/2009  . Wheezing 02/07/2010  . CHF (congestive heart failure)   . Impaired glucose tolerance 12/01/2013   Past Surgical History  Procedure Laterality Date  . Right shoulder surgery  2003  . Cataract extraction, bilateral      reports that he quit smoking about 18 years ago. His smoking use included Cigarettes. He smoked 0.25 packs per day. He does not have any smokeless tobacco history on file. He reports that he does not drink alcohol or use illicit drugs. family history includes Breast cancer in his daughter; Lung cancer in his father; Stroke in his brother. Allergies  Allergen Reactions  . Tramadol Nausea Only   Current Outpatient Prescriptions on  File Prior to Visit  Medication Sig Dispense Refill  . albuterol (PROVENTIL HFA;VENTOLIN HFA) 108 (90 BASE) MCG/ACT inhaler Inhale 1-2 puffs into the lungs every 6 (six) hours as needed for wheezing or shortness of breath.    . carbidopa-levodopa (SINEMET IR) 25-100 MG per tablet Take 1 tablet by mouth 3 (three) times daily. 90 tablet 5  . citalopram (CELEXA) 10 MG tablet Take 10 mg by mouth daily.    . cycloSPORINE (RESTASIS) 0.05 % ophthalmic emulsion Place 1 drop into both eyes 2 (two) times daily.    . fluticasone (FLONASE) 50 MCG/ACT nasal spray Place 2 sprays into both nostrils daily.    . furosemide (LASIX) 40 MG tablet Take 1 tablet (40 mg total) by mouth daily. 90 tablet 3  . hydrocortisone 2.5 % ointment Apply to affected area BID. 30 g 3  . ibuprofen (ADVIL,MOTRIN) 800 MG tablet Take 800 mg by mouth 2 (two) times daily.    . metoprolol tartrate (LOPRESSOR) 25 MG tablet Take 1 tablet (25 mg total) by mouth 2 (two) times daily. 180 tablet 3  . potassium chloride (K-DUR) 10 MEQ tablet Take 1 tablet (10 mEq total) by mouth 2 (two) times daily. 60 tablet 11  . tamsulosin (FLOMAX) 0.4 MG CAPS capsule take 1 capsule by mouth once daily 90 capsule 3   No current facility-administered medications on file prior to visit.    Review of Systems  Constitutional: Negative for unusual diaphoresis or other sweats  HENT: Negative for ringing in ear Eyes: Negative for double vision or worsening visual disturbance.  Respiratory: Negative for choking and stridor.   Gastrointestinal: Negative for vomiting or other signifcant bowel change Genitourinary: Negative for hematuria or decreased urine volume.  Musculoskeletal: Negative for other MSK pain or swelling Skin: Negative for color change and worsening wound.  Neurological: Negative for tremors and numbness other than noted  Psychiatric/Behavioral: Negative for decreased concentration or agitation other than above       Objective:   Physical  Exam BP 118/62 mmHg  Pulse 79  Temp(Src) 98.4 F (36.9 C) (Oral)  Ht 6' (1.829 m)  Wt 204 lb 8 oz (92.761 kg)  BMI 27.73 kg/m2  SpO2 95% VS noted,  Constitutional: Pt appears well-developed, well-nourished.  HENT: Head: NCAT.  Right Ear: External ear normal.  Left Ear: External ear normal.  Eyes: . Pupils are equal, round, and reactive to light. Conjunctivae and EOM are normal Neck: Normal range of motion. Neck supple.  Cardiovascular: Normal rate and regular rhythm.   Pulmonary/Chest: Effort normal and breath sounds mild decreased bilat no rales or wheezing  Abd:  Soft, NT, ND, + BS Neurological: Pt is alert. Not confused , motor grossly intact Skin: Skin is warm. No rash Psychiatric: Pt behavior is normal. No agitation.     Assessment & Plan:

## 2014-02-16 NOTE — Progress Notes (Signed)
Pre visit review using our clinic review tool, if applicable. No additional management support is needed unless otherwise documented below in the visit note. 

## 2014-02-17 DIAGNOSIS — R059 Cough, unspecified: Secondary | ICD-10-CM | POA: Insufficient documentation

## 2014-02-17 DIAGNOSIS — R05 Cough: Secondary | ICD-10-CM | POA: Insufficient documentation

## 2014-02-17 DIAGNOSIS — K219 Gastro-esophageal reflux disease without esophagitis: Secondary | ICD-10-CM | POA: Insufficient documentation

## 2014-02-17 NOTE — Assessment & Plan Note (Signed)
stable overall by history and exam, recent data reviewed with pt, and pt to continue medical treatment as before,  to f/u any worsening symptoms or concerns, for cont;d gabapentin refill today

## 2014-02-17 NOTE — Assessment & Plan Note (Signed)
?   Related to cough, to start PPI tx, to f/u any worsening symptoms or concerns

## 2014-02-17 NOTE — Assessment & Plan Note (Signed)
?   Etiology, ? Reflux, for cxr f/u as well

## 2014-02-25 ENCOUNTER — Telehealth: Payer: Self-pay | Admitting: Internal Medicine

## 2014-02-25 MED ORDER — DICLOFENAC SODIUM 2 % TD SOLN: TRANSDERMAL | Status: DC

## 2014-02-25 NOTE — Telephone Encounter (Signed)
Wife called and stated the pennsaid ointment really help. Would like a rx sent to [facility]...[name]

## 2014-02-25 NOTE — Telephone Encounter (Signed)
Pt request written rx for Pennsaid to be send to [facility].

## 2014-02-25 NOTE — Telephone Encounter (Signed)
rx sent to pharmacy

## 2014-03-23 ENCOUNTER — Other Ambulatory Visit (HOSPITAL_COMMUNITY): Payer: Self-pay | Admitting: Urology

## 2014-03-23 ENCOUNTER — Other Ambulatory Visit: Payer: Self-pay | Admitting: Internal Medicine

## 2014-03-23 DIAGNOSIS — C61 Malignant neoplasm of prostate: Secondary | ICD-10-CM

## 2014-03-29 ENCOUNTER — Ambulatory Visit: Payer: Medicare Other | Admitting: Family Medicine

## 2014-04-06 ENCOUNTER — Encounter (HOSPITAL_COMMUNITY)
Admission: RE | Admit: 2014-04-06 | Discharge: 2014-04-06 | Disposition: A | Discharge status: Home / Self Care | Payer: Medicare Other | Source: Ambulatory Visit | Attending: Urology | Admitting: Urology

## 2014-04-06 DIAGNOSIS — M25511 Pain in right shoulder: Secondary | ICD-10-CM | POA: Diagnosis present

## 2014-04-06 DIAGNOSIS — C61 Malignant neoplasm of prostate: Secondary | ICD-10-CM | POA: Diagnosis not present

## 2014-04-06 DIAGNOSIS — M899 Disorder of bone, unspecified: Secondary | ICD-10-CM | POA: Insufficient documentation

## 2014-04-06 DIAGNOSIS — M545 Low back pain: Secondary | ICD-10-CM | POA: Insufficient documentation

## 2014-04-06 MED ORDER — TECHNETIUM TC 99M MEDRONATE IV KIT
25.5000 | PACK | Freq: Once | INTRAVENOUS | Status: AC | PRN
  Administered 2014-04-06: 25.5 via INTRAVENOUS

## 2014-04-19 ENCOUNTER — Other Ambulatory Visit: Payer: Self-pay | Admitting: Internal Medicine

## 2014-04-23 ENCOUNTER — Other Ambulatory Visit (HOSPITAL_COMMUNITY): Payer: Self-pay | Admitting: Urology

## 2014-04-23 DIAGNOSIS — C61 Malignant neoplasm of prostate: Secondary | ICD-10-CM

## 2014-05-03 ENCOUNTER — Ambulatory Visit (INDEPENDENT_AMBULATORY_CARE_PROVIDER_SITE_OTHER): Payer: Medicare Other | Admitting: Neurology

## 2014-05-03 ENCOUNTER — Encounter: Payer: Self-pay | Admitting: Neurology

## 2014-05-03 VITALS — BP 126/68 | HR 86 | Ht 72.5 in | Wt 209.0 lb

## 2014-05-03 DIAGNOSIS — G20 Parkinson's disease: Secondary | ICD-10-CM

## 2014-05-03 NOTE — Progress Notes (Signed)
Gerald Gomez was seen today in the movement disorders clinic for neurologic consultation at the request of Dr. Letta Pate.  His PCP is [name], MD.  The consultation is for the evaluation of gait instability out of proportion to the degenerative changes in his back.  Pt is accompanied by his wife, who supplements the history.  Pt has had difficulty walking for about 1.5 years.  His wife reports that he began to fall.  Some of the falls were associated with acute CHF.  He was hospitalized in May for falls.  He was had gotten up to go to the kitchen and had fallen on the floor and could not get off the floor.  There was no LOC.  He is now receiving in home PT.  His wife is not sure if he is getting stronger or not.  She is requiring help to get him to stand.    12/29/13 update:  Pt returning for f/u.  Pt is on carbidopa/levodopa 25/100 bid even though he is to take it tid; he states that it made his stomach upset.  He had started it and then stopped it because he was convinced he didn't have PD and then his wife convinced him to restart it and follow up here.  He is out of the wheel chair; he says that he isn't doing better from the carbidopa/levodopa 25/100 but rather from his ibuprofen which took away "that inflammation from my spinal cord."  He is no longer falling and says that is from the ibuprofen.  He admits that his tremor is gone that he previously had.    05/03/14 update:  Pt is returning for f/u, on carbidopa/levodopa 25/100 but only taking bid (AM and noon) instead of tid.  He is overall "not good" but it is unrelated to PD and related to a new diagnosis of prostate CA.  No falls since last visit.  No hallucinations.  Still with chronic pain but much better than in July of last year.  No further tremor  PREVIOUS MEDICATIONS: none to date  ALLERGIES:   Allergies  Allergen Reactions  . Tramadol Nausea Only    CURRENT MEDICATIONS:  Current Outpatient Prescriptions on File Prior to  Visit  Medication Sig Dispense Refill  . albuterol (PROVENTIL HFA;VENTOLIN HFA) 108 (90 BASE) MCG/ACT inhaler Inhale 1-2 puffs into the lungs every 6 (six) hours as needed for wheezing or shortness of breath.    . carbidopa-levodopa (SINEMET IR) 25-100 MG per tablet Take 1 tablet by mouth 3 (three) times daily. 90 tablet 5  . citalopram (CELEXA) 10 MG tablet Take 10 mg by mouth daily.    . citalopram (CELEXA) 10 MG tablet take 1 tablet by mouth once daily 90 tablet 2  . cycloSPORINE (RESTASIS) 0.05 % ophthalmic emulsion Place 1 drop into both eyes 2 (two) times daily.    . Diclofenac Sodium 2 % SOLN Apply 1 pump twice daily. 112 g 3  . fluticasone (FLONASE) 50 MCG/ACT nasal spray Place 2 sprays into both nostrils daily.    . furosemide (LASIX) 40 MG tablet Take 1 tablet (40 mg total) by mouth daily. 90 tablet 3  . gabapentin (NEURONTIN) 100 MG capsule 2 tabs by mouth three times per day 180 capsule 5  . hydrocortisone 2.5 % ointment Apply to affected area BID. 30 g 3  . ibuprofen (ADVIL,MOTRIN) 800 MG tablet Take 800 mg by mouth 2 (two) times daily.    Marland Kitchen ibuprofen (ADVIL,MOTRIN) 800 MG  tablet take 1 tablet by mouth twice a day if needed for pain 60 tablet 6  . metoprolol tartrate (LOPRESSOR) 25 MG tablet Take 1 tablet (25 mg total) by mouth 2 (two) times daily. 180 tablet 3  . pantoprazole (PROTONIX) 40 MG tablet Take 1 tablet (40 mg total) by mouth daily. 90 tablet 3  . potassium chloride (K-DUR) 10 MEQ tablet Take 1 tablet (10 mEq total) by mouth 2 (two) times daily. 60 tablet 11  . tamsulosin (FLOMAX) 0.4 MG CAPS capsule take 1 capsule by mouth once daily 90 capsule 3   No current facility-administered medications on file prior to visit.    PAST MEDICAL HISTORY:   Past Medical History  Diagnosis Date  . ALLERGIC RHINITIS 03/28/2009  . ANXIETY 03/28/2009  . CHOLELITHIASIS 03/28/2009  . DEGENERATIVE JOINT DISEASE 03/28/2009  . DEPRESSION 03/28/2009  . [place] DISEASE, LUMBAR 03/28/2009  .  DIVERTICULOSIS, COLON 03/28/2009  . 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 FATIGUE 03/28/2009  . HEPATITIS C 03/28/2009  . HYPERLIPIDEMIA 03/28/2009  . HYPERTENSION 03/28/2009  . LOW BACK PAIN, CHRONIC 03/28/2009  . PERIPHERAL VASCULAR DISEASE 03/28/2009  . Personal history of alcoholism 03/28/2009  . RENAL INSUFFICIENCY 03/28/2009  . THROMBOCYTOPENIA 03/28/2009  . Wheezing 02/07/2010  . CHF (congestive heart failure)   . Impaired glucose tolerance 12/01/2013  . Prostate cancer     PAST SURGICAL HISTORY:   Past Surgical History  Procedure Laterality Date  . Right shoulder surgery  2003  . Cataract extraction, bilateral      SOCIAL HISTORY:   History   Social History  . Marital Status: Married    Spouse Name: N/A    Number of Children: N/A  . Years of Education: N/A   Occupational History  .      truck driver   Social History Main Topics  . Smoking status: Former Smoker -- 0.25 packs/day    Types: Cigarettes    Quit date: 03/27/1995  . Smokeless tobacco: Not on file  . Alcohol Use: No     Comment: quit drinking 1997  . Drug Use: No  . Sexual Activity:    Partners: Female   Other Topics Concern  . Not on file   Social History Narrative    FAMILY HISTORY:   Family Status  Relation Status Death Age  . Mother Deceased     GI bleed  . Father Deceased     CA, lung  . Brother Alive     CVA  . Daughter Deceased 46    breast CA  . Daughter Alive     healthy    ROS:  A complete 10 system review of systems was obtained and was unremarkable apart from what is mentioned above.  PHYSICAL EXAMINATION:    VITALS:   Filed Vitals:   05/03/14 1324  BP: 126/68  Pulse: 86  Height: 6' 0.5" (1.842 m)  Weight: 209 lb (94.802 kg)    GEN:  The patient appears stated age and is in NAD. HEENT:  Normocephalic, atraumatic.  The mucous membranes are moist. The superficial temporal arteries are without ropiness or tenderness. CV:  RRR Lungs:  CTAB Neck/HEME:  There are no carotid  bruits bilaterally.  Neurological examination:  Orientation: The patient is alert and oriented x3.  Relies on his wife for finer details of the history. Cranial nerves: There is good facial symmetry.  There is facial hypomimia.  Pupils are pinpoint and minimally reactive.  Funduscopic exam is attempted but  the disc margins are not well visualized.  There are no square wave jerks.  Extraocular muscles are intact. The visual fields are full to confrontational testing. The speech is fluent and clear. Soft palate rises symmetrically and there is no tongue deviation. Hearing is intact to conversational tone. Sensation: Sensation is intact to light touch throughout. Motor: Strength is at 5/5 in the UE/LE but grip strength is reduced bilaterally..   Movement examination: Tone: There is normal tone bilaterally Abnormal movements: There is no tremor noted today. Coordination:  There is no significant decremation with rapid alternating movements today. Gait and Station: The patient arises out of the chair by pushing off the chair.  He is stooped but looks like from arthritis and is not particularly arthritic.    ASSESSMENT/PLAN:  1.  Idiopathic Parkinson's disease, newly diagnosed 09/28/13.  -Pt looks markedly better on carbidopa/levodopa 25/100 but he attributes his success (out of the wheelchair and lack of tremor and no longer falls) to ibuprofen.  Explained to patient that while ibuprofen could help his pain it would not cause tremor improvement.  Only taking bid instead of RX tid dosing. 2.  Chronic LBP  -This is markedly improved. 3.  New dx of prostate CA 3.  F/u in next 6 months, sooner should new neuro issues arise.

## 2014-05-24 ENCOUNTER — Other Ambulatory Visit: Payer: Self-pay | Admitting: Internal Medicine

## 2014-06-01 ENCOUNTER — Ambulatory Visit (INDEPENDENT_AMBULATORY_CARE_PROVIDER_SITE_OTHER): Payer: Medicare Other | Admitting: Internal Medicine

## 2014-06-01 ENCOUNTER — Encounter: Payer: Self-pay | Admitting: Internal Medicine

## 2014-06-01 VITALS — BP 118/68 | HR 81 | Temp 99.1°F | Resp 20 | Ht 73.0 in | Wt 209.1 lb

## 2014-06-01 DIAGNOSIS — R7302 Impaired glucose tolerance (oral): Secondary | ICD-10-CM

## 2014-06-01 DIAGNOSIS — E785 Hyperlipidemia, unspecified: Secondary | ICD-10-CM | POA: Diagnosis not present

## 2014-06-01 DIAGNOSIS — I10 Essential (primary) hypertension: Secondary | ICD-10-CM | POA: Diagnosis not present

## 2014-06-01 DIAGNOSIS — F329 Major depressive disorder, single episode, unspecified: Secondary | ICD-10-CM

## 2014-06-01 DIAGNOSIS — F32A Depression, unspecified: Secondary | ICD-10-CM

## 2014-06-01 MED ORDER — AZITHROMYCIN 250 MG PO TABS: ORAL_TABLET | ORAL | Status: DC

## 2014-06-01 MED ORDER — ALBUTEROL SULFATE HFA 108 (90 BASE) MCG/ACT IN AERS: 2.0000 | INHALATION_SPRAY | Freq: Four times a day (QID) | RESPIRATORY_TRACT | Status: AC | PRN

## 2014-06-01 NOTE — Progress Notes (Signed)
Subjective:    Patient ID: Gerald Gomez, male    DOB: 06/14/1940, 74 y.o.   MRN: [id number]  HPI  Here for yearly f/u;  Overall doing ok;  Pt denies Chest pain, worsening SOB, DOE, wheezing, orthopnea, PND, worsening LE edema, palpitations, dizziness or syncope.  Pt denies neurological change such as new headache, facial or extremity weakness.  Pt denies polydipsia, polyuria, or low sugar symptoms. Pt states overall good compliance with treatment and medications, good tolerability, and has been trying to follow appropriate diet.  Pt denies worsening depressive symptoms, suicidal ideation or panic. No fever, night sweats, wt loss, loss of appetite, or other constitutional symptoms.  Pt states good ability with ADL's, has low fall risk, home safety reviewed and adequate, no other significant changes in hearing or vision, and only occasionally active with exercise.  Has seen dr [name], for repeat bone scan to re-eval abnormal l5 area soon. No falls, cont's to walk with walker. Has labs everu 3-6 mo with renal and urology.  Here with 2-3 days acute onset fever, facial pain, pressure, headache, general weakness and malaise, and greenish d/c, with mod ST and non prod cough. ? Feverish at home Past Medical History  Diagnosis Date  . ALLERGIC RHINITIS 03/28/2009  . ANXIETY 03/28/2009  . CHOLELITHIASIS 03/28/2009  . DEGENERATIVE JOINT DISEASE 03/28/2009  . DEPRESSION 03/28/2009  . [place] DISEASE, LUMBAR 03/28/2009  . DIVERTICULOSIS, COLON 03/28/2009  . 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 FATIGUE 03/28/2009  . HEPATITIS C 03/28/2009  . HYPERLIPIDEMIA 03/28/2009  . HYPERTENSION 03/28/2009  . LOW BACK PAIN, CHRONIC 03/28/2009  . PERIPHERAL VASCULAR DISEASE 03/28/2009  . Personal history of alcoholism 03/28/2009  . RENAL INSUFFICIENCY 03/28/2009  . THROMBOCYTOPENIA 03/28/2009  . Wheezing 02/07/2010  . CHF (congestive heart failure)   . Impaired glucose tolerance 12/01/2013  . Prostate cancer    Past Surgical History    Procedure Laterality Date  . Right shoulder surgery  2003  . Cataract extraction, bilateral      reports that he quit smoking about 19 years ago. His smoking use included Cigarettes. He smoked 0.25 packs per day. He does not have any smokeless tobacco history on file. He reports that he does not drink alcohol or use illicit drugs. family history includes Breast cancer in his daughter; Lung cancer in his father; Stroke in his brother. Allergies  Allergen Reactions  . Tramadol Nausea Only   Current Outpatient Prescriptions on File Prior to Visit  Medication Sig Dispense Refill  . albuterol (PROVENTIL HFA;VENTOLIN HFA) 108 (90 BASE) MCG/ACT inhaler Inhale 1-2 puffs into the lungs every 6 (six) hours as needed for wheezing or shortness of breath.    . carbidopa-levodopa (SINEMET IR) 25-100 MG per tablet Take 1 tablet by mouth 3 (three) times daily. 90 tablet 5  . citalopram (CELEXA) 10 MG tablet take 1 tablet by mouth once daily 90 tablet 2  . cycloSPORINE (RESTASIS) 0.05 % ophthalmic emulsion Place 1 drop into both eyes 2 (two) times daily.    . Diclofenac Sodium 2 % SOLN Apply 1 pump twice daily. 112 g 3  . fluticasone (FLONASE) 50 MCG/ACT nasal spray Place 2 sprays into both nostrils daily.    . furosemide (LASIX) 40 MG tablet take 1 tablet by mouth once daily 90 tablet 3  . gabapentin (NEURONTIN) 100 MG capsule 2 tabs by mouth three times per day 180 capsule 5  . hydrocortisone 2.5 % ointment Apply to affected area BID. 30 g  3  . ibuprofen (ADVIL,MOTRIN) 800 MG tablet Take 800 mg by mouth 2 (two) times daily.    . metoprolol tartrate (LOPRESSOR) 25 MG tablet Take 1 tablet (25 mg total) by mouth 2 (two) times daily. 180 tablet 3  . potassium chloride (K-DUR) 10 MEQ tablet Take 1 tablet (10 mEq total) by mouth 2 (two) times daily. 60 tablet 11  . tamsulosin (FLOMAX) 0.4 MG CAPS capsule take 1 capsule by mouth once daily 90 capsule 3   No current facility-administered medications on file  prior to visit.   Review of Systems  Constitutional: Negative for unusual diaphoresis or night sweats HENT: Negative for ringing in ear or discharge Eyes: Negative for double vision or worsening visual disturbance.  Respiratory: Negative for choking and stridor.   Gastrointestinal: Negative for vomiting or other signifcant bowel change Genitourinary: Negative for hematuria or change in urine volume.  Musculoskeletal: Negative for other MSK pain or swelling Skin: Negative for color change and worsening wound.  Neurological: Negative for tremors and numbness other than noted  Psychiatric/Behavioral: Negative for decreased concentration or agitation other than above       Objective:   Physical Exam BP 118/68 mmHg  Pulse 81  Temp(Src) 99.1 F (37.3 C) (Oral)  Resp 20  Ht 6\' 1"  (1.854 m)  Wt 209 lb 1.9 oz (94.856 kg)  BMI 27.60 kg/m2  SpO2 96% VS noted, mild ill Constitutional: Pt appears in no significant distress HENT: Head: NCAT.  Right Ear: External ear normal.  Left Ear: External ear normal.  Eyes: . Pupils are equal, round, and reactive to light. Conjunctivae and EOM are normal Bilat tm's with mild erythema.  Max sinus areas non tender.  Pharynx with mild erythema, no exudate Neck: Normal range of motion. Neck supple.  Cardiovascular: Normal rate and regular rhythm.   Pulmonary/Chest: Effort normal and breath sounds without rales or wheezing.  Neurological: Pt is alert. Not confused , motor grossly intact Skin: Skin is warm. No rash, no LE edema Psychiatric: Pt behavior is normal. No agitation.     Assessment & Plan:

## 2014-06-01 NOTE — Assessment & Plan Note (Signed)
stable overall by history and exam, recent data reviewed with pt, and pt to continue medical treatment as before,  to f/u any worsening symptoms or concerns Lab Results  Component Value Date   WBC 5.8 12/01/2013   HGB 11.0* 12/01/2013   HCT 33.9* 12/01/2013   PLT 99.0* 12/01/2013   GLUCOSE 106* 12/01/2013   CHOL 101 12/01/2013   TRIG * 12/01/2013    477.0 Triglyceride is over 400; calculations on Lipids are invalid.   HDL 15.30* 12/01/2013   LDLDIRECT 32.4 12/01/2013   ALT 12 12/01/2013   AST 51* 12/01/2013   NA 143 12/01/2013   K 4.1 12/01/2013   CL 114* 12/01/2013   CREATININE 2.3* 12/01/2013   BUN 51* 12/01/2013   CO2 21 12/01/2013   TSH 1.39 12/01/2013   PSA 6.18* 12/01/2013   INR 1.11 03/26/2013   HGBA1C 6.5 12/01/2013

## 2014-06-01 NOTE — Assessment & Plan Note (Signed)
stable overall by history and exam, recent data reviewed with pt, and pt to continue medical treatment as before,  to f/u any worsening symptoms or concerns Lab Results  Component Value Date   HGBA1C 6.5 12/01/2013

## 2014-06-01 NOTE — Assessment & Plan Note (Signed)
stable overall by history and exam, recent data reviewed with pt, and pt to continue medical treatment as before,  to f/u any worsening symptoms or concerns BP Readings from Last 3 Encounters:  06/01/14 118/68  05/03/14 126/68  02/16/14 118/62

## 2014-06-01 NOTE — Patient Instructions (Signed)
Please take all new medication as prescribed - the antibiotic  Please continue all other medications as before, and refills have been done if requested - the Proair changed to Ventolin HFA  Please have the pharmacy call with any other refills you may need.  Please continue your efforts at being more active, low cholesterol diet, and weight control.  You are otherwise up to date with prevention measures today.  Please keep your appointments with your specialists as you may have planned  Please return in 6 months, or sooner if needed

## 2014-06-01 NOTE — Assessment & Plan Note (Addendum)
stable overall by history and exam, recent data reviewed with pt, and pt to continue medical treatment as before,  to f/u any worsening symptoms or concerns Lab Results  Component Value Date   CHOL 101 12/01/2013   HDL 15.30* 12/01/2013   LDLDIRECT 32.4 12/01/2013   TRIG * 12/01/2013    477.0 Triglyceride is over 400; calculations on Lipids are invalid.   CHOLHDL 7 12/01/2013   For low fat diet, declines further med tx

## 2014-06-01 NOTE — Progress Notes (Signed)
Pre visit review using our clinic review tool, if applicable. No additional management support is needed unless otherwise documented below in the visit note. 

## 2014-06-05 ENCOUNTER — Other Ambulatory Visit: Payer: Self-pay | Admitting: Internal Medicine

## 2014-06-14 ENCOUNTER — Encounter (HOSPITAL_COMMUNITY)
Admission: RE | Admit: 2014-06-14 | Discharge: 2014-06-14 | Disposition: A | Discharge status: Home / Self Care | Payer: Medicare Other | Source: Ambulatory Visit | Attending: Urology | Admitting: Urology

## 2014-06-14 DIAGNOSIS — C61 Malignant neoplasm of prostate: Secondary | ICD-10-CM

## 2014-06-14 DIAGNOSIS — M25511 Pain in right shoulder: Secondary | ICD-10-CM | POA: Diagnosis present

## 2014-06-14 DIAGNOSIS — M545 Low back pain: Secondary | ICD-10-CM | POA: Diagnosis not present

## 2014-06-14 DIAGNOSIS — M899 Disorder of bone, unspecified: Secondary | ICD-10-CM | POA: Insufficient documentation

## 2014-06-14 MED ORDER — TECHNETIUM TC 99M MEDRONATE IV KIT
26.5000 | PACK | Freq: Once | INTRAVENOUS | Status: AC | PRN
  Administered 2014-06-14: 26.5 via INTRAVENOUS

## 2014-06-15 ENCOUNTER — Ambulatory Visit (HOSPITAL_COMMUNITY): Payer: Medicare Other

## 2014-06-15 ENCOUNTER — Encounter (HOSPITAL_COMMUNITY): Payer: Medicare Other

## 2014-06-17 ENCOUNTER — Encounter (HOSPITAL_COMMUNITY): Payer: Medicare Other

## 2014-06-29 HISTORY — PX: COLONOSCOPY W/ POLYPECTOMY: SHX1380

## 2014-07-02 ENCOUNTER — Other Ambulatory Visit: Payer: Self-pay | Admitting: Neurology

## 2014-07-02 MED ORDER — CARBIDOPA-LEVODOPA 25-100 MG PO TABS: 1.0000 | ORAL_TABLET | Freq: Three times a day (TID) | ORAL | Status: AC

## 2014-07-02 NOTE — Telephone Encounter (Signed)
Carbidopa Levodopa 25/100 refill requested. Per last office note- patient to remain on medication. Refill approved and sent to patient's pharmacy.   

## 2014-07-13 ENCOUNTER — Encounter: Payer: Self-pay | Admitting: Radiation Oncology

## 2014-07-13 NOTE — Progress Notes (Signed)
GU Location of Tumor / Histology: adenocarcinoma of the prostate  If Prostate Cancer, Gleason Score is (4 + 4) and PSA is (6.18). 38 cc prostate volume.  Gerald Gomez presented 01/31/2012 to his urologist and noted to have very small functional capacity. January 2016 patient reported urinary frequency and nocturia x 4 that had become worse over the past four months.  Biopsies of prostate (if applicable) revealed:    Past/Anticipated interventions by urology, if any: biopsy, bone scan and referral  Past/Anticipated interventions by medical oncology, if any: no  Weight changes, if any: no  Bowel/Bladder complaints, if any: frequency, nocturia, weak stream, and ED   Nausea/Vomiting, if any: no  Pain issues, if any:  no  SAFETY ISSUES:  Prior radiation? no  Pacemaker/ICD? no  Possible current pregnancy? no  Is the patient on methotrexate? no  Current Complaints / other details:  74 year old male. Married. Bone scan revealed uptake on left L3 verteral body degenerative vs metastatic. Received Lupron 04/22/2014. Using Toviaz and Tamsulosin.

## 2014-07-14 ENCOUNTER — Ambulatory Visit: Payer: Medicare Other | Admitting: Radiation Oncology

## 2014-07-15 ENCOUNTER — Ambulatory Visit
Admission: RE | Admit: 2014-07-15 | Discharge: 2014-07-15 | Disposition: A | Discharge status: Home / Self Care | Payer: Medicare Other | Source: Ambulatory Visit | Attending: Radiation Oncology | Admitting: Radiation Oncology

## 2014-07-15 ENCOUNTER — Telehealth: Payer: Self-pay | Admitting: *Deleted

## 2014-07-15 ENCOUNTER — Encounter: Payer: Self-pay | Admitting: Radiation Oncology

## 2014-07-15 VITALS — BP 128/70 | HR 75 | Resp 16 | Ht 72.0 in | Wt 208.1 lb

## 2014-07-15 DIAGNOSIS — C61 Malignant neoplasm of prostate: Secondary | ICD-10-CM

## 2014-07-15 NOTE — Progress Notes (Signed)
Patient here with his wife for consultation with Dr. [name] reference prostate ca. Patient referred by Dr. [name]. Patient ambulates with the aid of a rolling walker. Patient reports he had a colonoscopy two weeks ago and polyps removed. Polyps proved to be non cancerous. Denies hematuria or dysuria. Reports his urine stream varies from a strong to weak pattern but, denies difficulty emptying. Denis urgency or frequency. Denies incontinence or leakage. Reports nocturia x2. Denies pain. Daughter died in 07/17/2005 of breast ca. Vitals stable.

## 2014-07-15 NOTE — Progress Notes (Signed)
Radiation Oncology         [phone] ________________________________  Initial outpatient Consultation  Name: YOUSAF SAINATO MRN: [id number]  Date: 07/15/2014  DOB: 1940-04-13  [id number] Jenny Reichmann, MD  Kathie Rhodes, MD   REFERRING PHYSICIAN: Kathie Rhodes, MD  DIAGNOSIS: 74 y.o. gentleman with stage T1c adenocarcinoma of the prostate with a Gleason's score of 4+4 and a PSA of 6.18    ICD-9-CM ICD-10-CM   1. Malignant neoplasm of prostate Morven R Copenhaver is a 74 y.o. gentleman.  He was noted to have an elevated PSA of 6.18 in Sept 2015 by his primary care physician, Dr. Jenny Reichmann.  Accordingly, he was referred for evaluation in urology by Dr. [name] on 02/02/14,  digital rectal examination was performed at that time revealing 2+gland with no nodules.  The patient proceeded to transrectal ultrasound with 12 biopsies of the prostate on 03/09/14.  The prostate volume measured 38.01 cc.  Out of 12 core biopsies, 7 were positive.  The maximum Gleason score was 4+4, and this was seen in distribution below.     On 04/06/14, CT scan of the pelvis and bone scan showed no evidence of metastasis.  There was some non-definitive uptake in L3.  He started hormone therapy and repeat bone scan on 3/21 showed no definite cancer.  The patient reviewed the biopsy results and scan results with his urologist and he has kindly been referred today for discussion of potential radiation treatment options.   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    PAST SURGICAL HISTORY: Past Surgical History  Procedure Laterality Date  . Right shoulder surgery  2003  . Cataract extraction, bilateral    . Prostate biopsy    . Colonoscopy w/ polypectomy  06/29/2014    polpys proved to be non cancerous done at [facility]: family history includes Breast cancer in his daughter; Lung cancer in his father; Stroke in his brother.  SOCIAL HISTORY:  reports that he quit smoking about 19 years ago. His smoking use included Cigarettes. He smoked 0.25 packs per day. He has never used smokeless tobacco. He reports that he does not drink alcohol or use illicit drugs.  ALLERGIES: Tramadol  MEDICATIONS:  Current Outpatient Prescriptions  Medication Sig Dispense Refill  . albuterol (PROVENTIL HFA;VENTOLIN HFA) 108 (90 BASE) MCG/ACT inhaler Inhale 2 puffs into the lungs every 6 (six) hours as needed for wheezing or shortness of breath. Dispense Ventolin HFA please 1 Inhaler 11  . carbidopa-levodopa (SINEMET IR) 25-100 MG per tablet Take 1 tablet by mouth 3 (three) times daily. 90 tablet 5  . citalopram (CELEXA) 10 MG tablet take 1 tablet by mouth once daily 90 tablet 2  . cycloSPORINE (RESTASIS) 0.05 % ophthalmic emulsion Place 1 drop into both eyes 2 (two) times daily.    . Diclofenac Sodium 2 % SOLN Apply 1 pump twice daily. 112 g 3  . fluticasone (FLONASE) 50 MCG/ACT nasal spray Place 2 sprays into both nostrils daily.    Marland Kitchen  furosemide (LASIX) 40 MG tablet take 1 tablet by mouth once daily 90 tablet 3  . gabapentin (NEURONTIN) 100 MG capsule 2 tabs by mouth three times per day 180 capsule 5  . GAVILYTE-N WITH FLAVOR PACK 420 G solution   0  . hydrocortisone 2.5 % ointment Apply to affected area BID. 30 g 3   . ibuprofen (ADVIL,MOTRIN) 800 MG tablet Take 800 mg by mouth 2 (two) times daily.    . metoprolol tartrate (LOPRESSOR) 25 MG tablet Take 1 tablet (25 mg total) by mouth 2 (two) times daily. 180 tablet 3  . potassium chloride (K-DUR) 10 MEQ tablet Take 1 tablet (10 mEq total) by mouth 2 (two) times daily. 60 tablet 11  . potassium chloride (K-DUR,KLOR-CON) 10 MEQ tablet take 1 tablet twice a day 60 tablet 11  . tamsulosin (FLOMAX) 0.4 MG CAPS capsule take 1 capsule by mouth once daily 90 capsule 3  . TOVIAZ 4 MG TB24 tablet Take 4 mg by mouth daily.  0   No current facility-administered medications for this encounter.    REVIEW OF SYSTEMS:  A 15 point review of systems is documented in the electronic medical record. This was obtained by the nursing staff. However, I reviewed this with the patient to discuss relevant findings and make appropriate changes.  A comprehensive review of systems was negative..  The patient completed an IPSS and IIEF questionnaire.  His IPSS score was 9 indicating moderate urinary outflow obstructive symptoms.  He indicated that his erectile function is able to complete sexual activity more that half the time.   PHYSICAL EXAM: This patient is in no acute distress.  He is alert and oriented.   height is 6' (1.829 m) and weight is 208 lb 1.6 oz (94.394 kg). His blood pressure is 128/70 and his pulse is 75. His respiration is 16 and oxygen saturation is 96%.  He exhibits no respiratory distress or labored breathing.  He appears neurologically intact.  His mood is pleasant.  His affect is appropriate.  Please note the digital rectal exam findings described above.  KPS = 100  100 - Normal; no complaints; no evidence of disease. 90   - Able to carry on normal activity; minor signs or symptoms of disease. 80   - Normal activity with effort; some signs or symptoms of disease. 28   - Cares for self; unable to carry on normal activity or to do active work. 60   - Requires  occasional assistance, but is able to care for most of his personal needs. 50   - Requires considerable assistance and frequent medical care. 41   - Disabled; requires special care and assistance. 77   - Severely disabled; hospital admission is indicated although death not imminent. 83   - Very sick; hospital admission necessary; active supportive treatment necessary. 10   - Moribund; fatal processes progressing rapidly. 0     - Dead  Karnofsky DA, Abelmann [place], Craver LS and Burchenal [facility] [phone]) The use of the nitrogen mustards in the palliative treatment of carcinoma: with particular reference to bronchogenic carcinoma Cancer 1 634-56   LABORATORY DATA:  Lab Results  Component Value Date   WBC 5.8 12/01/2013   HGB 11.0* 12/01/2013   HCT 33.9* 12/01/2013   MCV 86.4 12/01/2013   PLT 99.0* 12/01/2013   Lab Results  Component Value Date   NA 143 12/01/2013   K 4.1 12/01/2013   CL 114* 12/01/2013   CO2 21 12/01/2013  Lab Results  Component Value Date   ALT 12 12/01/2013   AST 51* 12/01/2013   ALKPHOS 76 12/01/2013   BILITOT 0.2 12/01/2013     RADIOGRAPHY: No results found.    IMPRESSION: This gentleman is a 74 y.o. gentleman with stage T1c adenocarcinoma of the prostate with a Gleason's score of 4+4 and a PSA of 6.18.  His T-Stage, Gleason's Score, and PSA put him into the high risk group.  Accordingly he is eligible for a variety of potential treatment options including external radiation and 2-3 years of hormone therapy with or without seed implant boost.  PLAN:Today I reviewed the findings and workup thus far.  We discussed the natural history of prostate cancer.  We reviewed the the implications of T-stage, Gleason's Score, and PSA on decision-making and outcomes in prostate cancer.  We discussed radiation treatment in the management of prostate cancer with regard to the logistics and delivery of external beam radiation treatment as well as the logistics and delivery of  prostate brachytherapy.  We compared and contrasted each of these approaches and also compared these against prostatectomy.  The patient expressed interest in external beam radiotherapy.  I filled out a patient counseling form for him with relevant treatment diagrams and we retained a copy for our records.   The patient would like to proceed with prostate irradiation with external beam followed by seed implant.  I will share my findings with Dr. [name] and move forward with scheduling placement of three gold fiducial markers into the prostate to proceed with XRT in the near future.     I enjoyed meeting with him today, and will look forward to participating in the care of this very nice gentleman.   I spent 60 minutes face to face with the patient and more than 50% of that time was spent in counseling and/or coordination of care.   This document serves as a record of services personally performed by Tyler Pita, MD. It was created on his behalf by Arlyce Harman, a trained medical scribe. The creation of this record is based on the scribe's personal observations and the provider's statements to them. This document has been checked and approved by the attending provider.     ------------------------------------------------  Sheral Apley [name], M.D.

## 2014-07-15 NOTE — Telephone Encounter (Signed)
CALLED PATIENT TO INFORM OF GOLD SEED PLACEMENT ON 07-29-14- ARRIVAL TIME - 1:15 PM @ DR. OTTELIN'S OFFICE AND HIS SIM ON 08-12-14 @ 2 PM @ DR. MANNING'S OFFICE, SPOKE WITH PATIENT'S WIFE AND SHE IS AWARE OF THESE APPTS.

## 2014-07-15 NOTE — Progress Notes (Signed)
See progress note under physician encounter. 

## 2014-07-20 ENCOUNTER — Other Ambulatory Visit: Payer: Self-pay | Admitting: Internal Medicine

## 2014-08-02 ENCOUNTER — Encounter: Payer: Self-pay | Admitting: Internal Medicine

## 2014-08-12 ENCOUNTER — Ambulatory Visit
Admission: RE | Admit: 2014-08-12 | Discharge: 2014-08-12 | Disposition: A | Discharge status: Home / Self Care | Payer: Medicare Other | Source: Ambulatory Visit | Attending: Radiation Oncology | Admitting: Radiation Oncology

## 2014-08-12 DIAGNOSIS — C61 Malignant neoplasm of prostate: Secondary | ICD-10-CM | POA: Diagnosis not present

## 2014-08-12 MED ORDER — OXYCODONE-ACETAMINOPHEN 5-325 MG PO TABS
2.0000 | ORAL_TABLET | ORAL | Status: AC
  Administered 2014-08-12: 2 via ORAL
  Filled 2014-08-12: qty 2

## 2014-08-12 MED ORDER — LORAZEPAM 1 MG PO TABS
1.0000 mg | ORAL_TABLET | ORAL | Status: AC
  Administered 2014-08-12: 1 mg via ORAL
  Filled 2014-08-12: qty 1

## 2014-08-12 NOTE — Progress Notes (Signed)
  Radiation Oncology         [phone] ________________________________  Name: Gerald Gomez MRN: [id number]  Date: 08/12/2014  DOB: 1940/06/04  SIMULATION AND TREATMENT PLANNING NOTE    ICD-9-CM ICD-10-CM   1. Malignant neoplasm of prostate 185 C61 LORazepam (ATIVAN) tablet 1 mg     oxyCODONE-acetaminophen (PERCOCET/ROXICET) 5-325 MG per tablet 2 tablet    DIAGNOSIS: 74 y.o. gentleman with stage T1c adenocarcinoma of the prostate with a Gleason's score of 4+4 and a PSA of 6.18    ICD-9-CM ICD-10-CM   1. Malignant neoplasm of prostate 185 C61     NARRATIVE:  The patient was brought to the [facility].  Identity was confirmed.  All relevant records and images related to the planned course of therapy were reviewed.  The patient freely provided informed written consent to proceed with treatment after reviewing the details related to the planned course of therapy. The consent form was witnessed and verified by the simulation staff.  Then, the patient was set-up in a stable reproducible supine position for radiation therapy.  A vacuum lock pillow device was custom fabricated to position his legs in a reproducible immobilized position.  Then, I performed a urethrogram under sterile conditions to identify the prostatic apex.  CT images were obtained.    At this point, the patient developed so much pain in the treatment position, that he asked to stop the planning appointment. We have rescheduled the appointment for Friday, May 27 and provided the patient with premedications to help with pain.  This document serves as a record of services personally performed by Tyler Pita, MD. It was created on his behalf by Arlyce Harman, a trained medical scribe. The creation of this record is based on the scribe's personal observations and the provider's statements to them. This document has been checked and approved by the attending provider.      ________________________________  Sheral Apley. [name], M.D.

## 2014-08-12 NOTE — Progress Notes (Signed)
Gave patient Ativan 1 mg po and Percocet two tablets as directed by Dr. [name]. Shortly after administration of these medications patient vomited. Unable to complete simulation. Patient rescheduled for simulation. Patient brought to nursing for evaluation by Dr. [name].

## 2014-08-13 ENCOUNTER — Telehealth: Payer: Self-pay | Admitting: Radiation Oncology

## 2014-08-13 ENCOUNTER — Encounter: Payer: Self-pay | Admitting: Radiation Oncology

## 2014-08-13 NOTE — Progress Notes (Signed)
Late entry from 08/12/2014 at 1600. Patient unable to complete CT/Simulation despite ativan 1 mg and Percocet 5/325. Patient reports he was unable to lay still on the simulation table due to nerve pain from DJD. Heart rate 75 and bp 123/64 following simulation attempt. After speaking with the patient, his wife and Dr. [name] it was decided the patient would take Motrin 800 mg every six hours for 48 hours prior to re scheduled simulation on Friday, May 27th to reduce inflammation. In addition, patient will take Ativan 2 mg thirty minutes prior to sim appt. Ativan script called to [facility] at [facility] on [facility]. Wife plans to pick up Ativan script on Friday, May 20.

## 2014-08-13 NOTE — Telephone Encounter (Signed)
Per Dr. Johny Shears order called in Ativan 2 mg to [facility] at [facility].

## 2014-08-19 ENCOUNTER — Telehealth: Payer: Self-pay | Admitting: Radiation Oncology

## 2014-08-19 NOTE — Telephone Encounter (Signed)
Spoke with patient's wife, [name]. She confirms her husband has picked up the Ativan script in preparation for [facility] tomorrow. She confirms he is also taking Motrin as directed.

## 2014-08-19 NOTE — Progress Notes (Signed)
  Radiation Oncology         [phone] ________________________________  Name: Gerald Gomez MRN: [id number]  Date: 08/20/2014  DOB: 24-Jan-1941  SIMULATION AND TREATMENT PLANNING NOTE    ICD-9-CM ICD-10-CM   1. Malignant neoplasm of prostate 185 C61     DIAGNOSIS:  Gerald Gomez is a 74 year old gentleman with stage T1c adenocarcinoma of the prostate with a Gleason's score of 4+4 and a PSA of 6.18.  NARRATIVE:  The patient was brought to the [facility].  Identity was confirmed.  All relevant records and images related to the planned course of therapy were reviewed. The patient was brought back to the CT simulation, in an attempt to complete the implanting process that was started on 08/12/2014. Then, the patient was set-up in a stable reproducible supine position for radiation therapy.  A vacuum lock pillow device was previously custom fabricated to position his legs in a reproducible immobilized position, and the patient was placed back in the device for imaging today.  CT images were obtained.  Surface markings were placed. CT images were loaded into the planning software.  Then the prostate target and avoidance structures including the rectum, bladder, bowel and hips were contoured.  Treatment planning then occurred.  The radiation prescription was entered and confirmed.  A total of 1 complex treatment device was fabricated. I have requested : Intensity Modulated Radiotherapy (IMRT) is medically necessary for this case for the following reason:  Rectal sparing.Marland Kitchen  PLAN:  The patient will receive 78 Gy in 40 fractions.   This document serves as a record of services personally performed by Tyler Pita, MD. It was created on his behalf by Lenn Cal, a trained medical scribe. The creation of this record is based on the scribe's personal observations and the provider's statements to them. This document has been checked and approved by the attending  provider.   ________________________________  Sheral Apley. [name], M.D.

## 2014-08-20 ENCOUNTER — Ambulatory Visit
Admission: RE | Admit: 2014-08-20 | Discharge: 2014-08-20 | Disposition: A | Discharge status: Home / Self Care | Payer: Medicare Other | Source: Ambulatory Visit | Attending: Radiation Oncology | Admitting: Radiation Oncology

## 2014-08-20 DIAGNOSIS — C61 Malignant neoplasm of prostate: Secondary | ICD-10-CM

## 2014-08-24 ENCOUNTER — Ambulatory Visit: Payer: Medicare Other | Admitting: Radiation Oncology

## 2014-08-24 DIAGNOSIS — C61 Malignant neoplasm of prostate: Secondary | ICD-10-CM | POA: Diagnosis not present

## 2014-08-25 ENCOUNTER — Telehealth: Payer: Self-pay | Admitting: Radiation Oncology

## 2014-08-25 ENCOUNTER — Ambulatory Visit: Payer: Medicare Other

## 2014-08-25 NOTE — Telephone Encounter (Signed)
Phoned patient to discuss treatment and need for premeds. Patient confirmed his back and leg pain became so severe during simulation he was almost unable to complete the scan. Questioned what medications he used in the patient to relieve pain. Patient reports he has only tried a friend's hydrocodone in the past. Patient requested, "a medicine that does stay in his system long." Questioned if he would consider MSIR. He confirmed he would and would ensure his wife drove him to and from each appointment. Patient understands to take selected pain medication thirty minutes prior to radiation treatment. Will contact patient when paper script is ready to be picked up.

## 2014-08-26 ENCOUNTER — Ambulatory Visit: Payer: Medicare Other

## 2014-08-26 DIAGNOSIS — C61 Malignant neoplasm of prostate: Secondary | ICD-10-CM | POA: Diagnosis not present

## 2014-08-27 ENCOUNTER — Ambulatory Visit: Payer: Medicare Other

## 2014-08-30 ENCOUNTER — Ambulatory Visit: Payer: Medicare Other

## 2014-08-31 ENCOUNTER — Ambulatory Visit: Payer: Medicare Other

## 2014-08-31 ENCOUNTER — Other Ambulatory Visit: Payer: Self-pay | Admitting: Radiation Oncology

## 2014-09-01 ENCOUNTER — Other Ambulatory Visit: Payer: Self-pay | Admitting: Radiation Oncology

## 2014-09-01 ENCOUNTER — Ambulatory Visit
Admission: RE | Admit: 2014-09-01 | Discharge: 2014-09-01 | Disposition: A | Discharge status: Home / Self Care | Payer: Medicare Other | Source: Ambulatory Visit | Attending: Radiation Oncology | Admitting: Radiation Oncology

## 2014-09-01 ENCOUNTER — Ambulatory Visit: Payer: Medicare Other

## 2014-09-01 DIAGNOSIS — C61 Malignant neoplasm of prostate: Secondary | ICD-10-CM

## 2014-09-01 DIAGNOSIS — F411 Generalized anxiety disorder: Secondary | ICD-10-CM

## 2014-09-01 DIAGNOSIS — M5440 Lumbago with sciatica, unspecified side: Secondary | ICD-10-CM

## 2014-09-01 MED ORDER — LORAZEPAM 2 MG PO TABS: 2.0000 mg | ORAL_TABLET | Freq: Four times a day (QID) | ORAL | Status: DC | PRN

## 2014-09-01 MED ORDER — MORPHINE SULFATE 4 MG/ML IJ SOLN
4.0000 mg | INTRAMUSCULAR | Status: AC
  Administered 2014-09-01: 4 mg via INTRAMUSCULAR
  Filled 2014-09-01: qty 1

## 2014-09-01 MED ORDER — MORPHINE SULFATE 15 MG PO TABS: 15.0000 mg | ORAL_TABLET | ORAL | Status: DC | PRN

## 2014-09-01 NOTE — Progress Notes (Signed)
Per Dr. Johny Shears order pre medicated patient with Morphine 4 mg IM prior to initial treatment.

## 2014-09-02 ENCOUNTER — Ambulatory Visit: Payer: Medicare Other

## 2014-09-02 ENCOUNTER — Ambulatory Visit
Admission: RE | Admit: 2014-09-02 | Discharge: 2014-09-02 | Disposition: A | Discharge status: Home / Self Care | Payer: Medicare Other | Source: Ambulatory Visit | Attending: Radiation Oncology | Admitting: Radiation Oncology

## 2014-09-02 ENCOUNTER — Other Ambulatory Visit: Payer: Self-pay | Admitting: Radiation Oncology

## 2014-09-02 VITALS — BP 141/66 | HR 74 | Temp 98.0°F | Resp 20

## 2014-09-02 DIAGNOSIS — M5440 Lumbago with sciatica, unspecified side: Secondary | ICD-10-CM

## 2014-09-02 DIAGNOSIS — C61 Malignant neoplasm of prostate: Secondary | ICD-10-CM

## 2014-09-02 DIAGNOSIS — F411 Generalized anxiety disorder: Secondary | ICD-10-CM

## 2014-09-02 MED ORDER — MORPHINE SULFATE 15 MG PO TABS: 15.0000 mg | ORAL_TABLET | ORAL | Status: DC | PRN

## 2014-09-02 MED ORDER — MORPHINE SULFATE 4 MG/ML IJ SOLN
4.0000 mg | Freq: Once | INTRAMUSCULAR | Status: AC
  Administered 2014-09-02: 4 mg via INTRAMUSCULAR
  Filled 2014-09-02: qty 1

## 2014-09-02 NOTE — Progress Notes (Signed)
Patient tolerated radiation treatment, stated"pain a little bit at the last couple minutes on table" 2/10,took vitals, assisted patient from linac#1 table to dressing room, steady gait with walker, asked patient if he wanted a w/c to go back to the lobby to his car,patient declined, reemphasized to take his RX MSIR 15mg  tabet 3o minutes before his treatment,can take every 4 hours for severe pain if needed, patient and wife gave verbal understanding, wife has rx in hand 2:54 PM

## 2014-09-02 NOTE — Progress Notes (Addendum)
Lianac#1 called Eve,RT therapist called "patient requesting morphine shot,can't ly on table, he only took Ibuprofen today, didn't take ativan said it didn't help", asked MD for order, . Patient stated he didn't have rx for MSIR  15mg  tablets, "order for 4mg  Morphine IM x 1 per Dr. [name], patient asked name and dob as identification, asked where his pain is, "8/10,  Back, radiating down to his leg when he lays on table, i have arthritis all over -",, verified 4mg  Morphine  With Santiago Glad Hess,RN, gave 4mg  IM Morphine to his left deltoid, patient tolerated well, will get rx for patient and give to his wife i, instructions to take 30 minutes before coming for his radiation treatments Verbal understanding 2:18 PM

## 2014-09-03 ENCOUNTER — Ambulatory Visit
Admission: RE | Admit: 2014-09-03 | Discharge: 2014-09-03 | Disposition: A | Discharge status: Home / Self Care | Payer: Medicare Other | Source: Ambulatory Visit | Attending: Radiation Oncology | Admitting: Radiation Oncology

## 2014-09-03 ENCOUNTER — Ambulatory Visit: Payer: Medicare Other

## 2014-09-03 ENCOUNTER — Encounter: Payer: Self-pay | Admitting: Radiation Oncology

## 2014-09-03 VITALS — BP 133/76 | HR 70 | Resp 16 | Wt 211.5 lb

## 2014-09-03 DIAGNOSIS — C61 Malignant neoplasm of prostate: Secondary | ICD-10-CM | POA: Diagnosis not present

## 2014-09-03 NOTE — Progress Notes (Addendum)
Weight and vitals stable. Denies pain at this time. Reports that he took MSIR thirty minutes prior to radiation treatment. Reports he endured the treatment well until near the end when his left leg became painful. Ambulating with the aid of a cane. Reports nocturia x1. Denies urgency or frequency. Denies dysuria or hematuria. Denies diarrhea. Denies fatigue. Questioned flat affect. Patient explains he has lost several family members to cancer and "knows its a very serious disease." Encouraged patient to feel comfortable expressing his feelings and needs to this RN so that she can manage his needs. Patient verbalized understanding.   Oriented patient and his wife to staff and routine of the clinic. Provided patient with RADIATION THERAPY AND YOU handbook then, reviewed pertinent information. Educated patient reference potential side effects and management such as, fatigue and bowel/bladder changes. Encouraged patient to contact this RN with future needs. Provided patient with this RN's business card. Patient verbalized understanding of all reviewed.

## 2014-09-03 NOTE — Progress Notes (Signed)
  Radiation Oncology         [phone]   Name: Gerald Gomez MRN: [id number]   Date: 09/03/2014  DOB: 1940-09-14   Weekly Radiation Therapy Management    ICD-9-CM ICD-10-CM   1. Malignant neoplasm of prostate 185 C61     Current Dose: 5.85 Gy  Planned Dose:  78 Gy  Narrative The patient presents for routine under treatment assessment. Denies pain at this time. Reports that he took MSIR thirty minutes prior to radiation treatment. Reports he endured the treatment well until near the end when his left leg became painful. Ambulating with the aid of a cane. Reports nocturia x1. Denies urgency or frequency. Denies dysuria or hematuria. Denies diarrhea. Denies fatigue. Questioned flat affect. Patient explains he has lost several family members to cancer and "knows its a very serious disease." Encouraged patient to feel comfortable expressing his feeling and needs to this RN so that she can manage his needs. Patient verbalized understanding. The patient is without complaint. Set-up films were reviewed. The chart was checked.  Physical Findings  weight is 211 lb 8 oz (95.936 kg). His blood pressure is 133/76 and his pulse is 70. His respiration is 16. . Weight essentially stable.  No significant changes.  Impression The patient is tolerating radiation.  Plan Continue treatment as planned.     This document serves as a record of services personally performed by Tyler Pita, MD. It was created on his behalf by Arlyce Harman, a trained medical scribe. The creation of this record is based on the scribe's personal observations and the provider's statements to them. This document has been checked and approved by the attending provider.       Sheral Apley [name], M.D.

## 2014-09-05 ENCOUNTER — Other Ambulatory Visit: Payer: Self-pay | Admitting: Internal Medicine

## 2014-09-06 ENCOUNTER — Ambulatory Visit
Admission: RE | Admit: 2014-09-06 | Discharge: 2014-09-06 | Disposition: A | Discharge status: Home / Self Care | Payer: Medicare Other | Source: Ambulatory Visit | Attending: Radiation Oncology | Admitting: Radiation Oncology

## 2014-09-06 ENCOUNTER — Ambulatory Visit: Payer: Medicare Other

## 2014-09-07 ENCOUNTER — Ambulatory Visit: Payer: Medicare Other

## 2014-09-07 ENCOUNTER — Ambulatory Visit
Admission: RE | Admit: 2014-09-07 | Discharge: 2014-09-07 | Disposition: A | Discharge status: Home / Self Care | Payer: Medicare Other | Source: Ambulatory Visit | Attending: Radiation Oncology | Admitting: Radiation Oncology

## 2014-09-08 ENCOUNTER — Encounter (HOSPITAL_COMMUNITY): Payer: Self-pay | Admitting: Nurse Practitioner

## 2014-09-08 ENCOUNTER — Ambulatory Visit: Payer: Medicare Other

## 2014-09-08 ENCOUNTER — Emergency Department (HOSPITAL_COMMUNITY): Payer: Medicare Other

## 2014-09-08 ENCOUNTER — Telehealth: Payer: Self-pay | Admitting: Radiation Oncology

## 2014-09-08 ENCOUNTER — Inpatient Hospital Stay (HOSPITAL_COMMUNITY)
Admission: EM | Admit: 2014-09-08 | Discharge: 2014-09-09 | DRG: 092 | Disposition: A | Discharge status: Home Health | Payer: Medicare Other | Attending: Internal Medicine | Admitting: Internal Medicine

## 2014-09-08 DIAGNOSIS — C61 Malignant neoplasm of prostate: Secondary | ICD-10-CM

## 2014-09-08 DIAGNOSIS — M5440 Lumbago with sciatica, unspecified side: Secondary | ICD-10-CM

## 2014-09-08 DIAGNOSIS — K219 Gastro-esophageal reflux disease without esophagitis: Secondary | ICD-10-CM | POA: Diagnosis present

## 2014-09-08 DIAGNOSIS — N189 Chronic kidney disease, unspecified: Secondary | ICD-10-CM

## 2014-09-08 DIAGNOSIS — G20 Parkinson's disease: Secondary | ICD-10-CM | POA: Diagnosis present

## 2014-09-08 DIAGNOSIS — G92 Toxic encephalopathy: Principal | ICD-10-CM | POA: Diagnosis present

## 2014-09-08 DIAGNOSIS — R296 Repeated falls: Secondary | ICD-10-CM

## 2014-09-08 DIAGNOSIS — R410 Disorientation, unspecified: Secondary | ICD-10-CM | POA: Diagnosis not present

## 2014-09-08 DIAGNOSIS — F411 Generalized anxiety disorder: Secondary | ICD-10-CM | POA: Diagnosis present

## 2014-09-08 DIAGNOSIS — N320 Bladder-neck obstruction: Secondary | ICD-10-CM | POA: Diagnosis present

## 2014-09-08 DIAGNOSIS — I951 Orthostatic hypotension: Secondary | ICD-10-CM | POA: Diagnosis present

## 2014-09-08 DIAGNOSIS — T402X5A Adverse effect of other opioids, initial encounter: Secondary | ICD-10-CM | POA: Diagnosis present

## 2014-09-08 DIAGNOSIS — M545 Low back pain: Secondary | ICD-10-CM | POA: Diagnosis present

## 2014-09-08 DIAGNOSIS — N183 Chronic kidney disease, stage 3 unspecified: Secondary | ICD-10-CM | POA: Diagnosis present

## 2014-09-08 DIAGNOSIS — R140 Abdominal distension (gaseous): Secondary | ICD-10-CM

## 2014-09-08 DIAGNOSIS — D696 Thrombocytopenia, unspecified: Secondary | ICD-10-CM | POA: Diagnosis present

## 2014-09-08 DIAGNOSIS — Z9114 Patient's other noncompliance with medication regimen: Secondary | ICD-10-CM | POA: Diagnosis present

## 2014-09-08 DIAGNOSIS — D6959 Other secondary thrombocytopenia: Secondary | ICD-10-CM | POA: Diagnosis present

## 2014-09-08 DIAGNOSIS — Z87891 Personal history of nicotine dependence: Secondary | ICD-10-CM

## 2014-09-08 DIAGNOSIS — G928 Other toxic encephalopathy: Secondary | ICD-10-CM | POA: Diagnosis present

## 2014-09-08 DIAGNOSIS — I5032 Chronic diastolic (congestive) heart failure: Secondary | ICD-10-CM | POA: Diagnosis present

## 2014-09-08 DIAGNOSIS — R531 Weakness: Secondary | ICD-10-CM

## 2014-09-08 DIAGNOSIS — N179 Acute kidney failure, unspecified: Secondary | ICD-10-CM | POA: Diagnosis present

## 2014-09-08 DIAGNOSIS — I129 Hypertensive chronic kidney disease with stage 1 through stage 4 chronic kidney disease, or unspecified chronic kidney disease: Secondary | ICD-10-CM | POA: Diagnosis present

## 2014-09-08 DIAGNOSIS — Z79899 Other long term (current) drug therapy: Secondary | ICD-10-CM

## 2014-09-08 DIAGNOSIS — F329 Major depressive disorder, single episode, unspecified: Secondary | ICD-10-CM | POA: Diagnosis present

## 2014-09-08 DIAGNOSIS — Z7951 Long term (current) use of inhaled steroids: Secondary | ICD-10-CM

## 2014-09-08 DIAGNOSIS — B1920 Unspecified viral hepatitis C without hepatic coma: Secondary | ICD-10-CM | POA: Diagnosis present

## 2014-09-08 DIAGNOSIS — E785 Hyperlipidemia, unspecified: Secondary | ICD-10-CM | POA: Diagnosis present

## 2014-09-08 DIAGNOSIS — W19XXXA Unspecified fall, initial encounter: Secondary | ICD-10-CM

## 2014-09-08 DIAGNOSIS — G8929 Other chronic pain: Secondary | ICD-10-CM | POA: Diagnosis present

## 2014-09-08 DIAGNOSIS — F419 Anxiety disorder, unspecified: Secondary | ICD-10-CM | POA: Diagnosis present

## 2014-09-08 LAB — CBC WITH DIFFERENTIAL/PLATELET
Basophils Absolute: 0 10*3/uL (ref 0.0–0.1)
Basophils Relative: 0 % (ref 0–1)
EOS PCT: 4 % (ref 0–5)
Eosinophils Absolute: 0.2 10*3/uL (ref 0.0–0.7)
HCT: 30.3 % — ABNORMAL LOW (ref 39.0–52.0)
HEMOGLOBIN: 9.5 g/dL — AB (ref 13.0–17.0)
Lymphocytes Relative: 32 % (ref 12–46)
Lymphs Abs: 1.6 10*3/uL (ref 0.7–4.0)
MCH: 26.9 pg (ref 26.0–34.0)
MCHC: 31.4 g/dL (ref 30.0–36.0)
MCV: 85.8 fL (ref 78.0–100.0)
MONOS PCT: 10 % (ref 3–12)
Monocytes Absolute: 0.5 10*3/uL (ref 0.1–1.0)
NEUTROS PCT: 53 % (ref 43–77)
Neutro Abs: 2.7 10*3/uL (ref 1.7–7.7)
Platelets: 80 10*3/uL — ABNORMAL LOW (ref 150–400)
RBC: 3.53 MIL/uL — AB (ref 4.22–5.81)
RDW: 15.6 % — ABNORMAL HIGH (ref 11.5–15.5)
WBC: 5 10*3/uL (ref 4.0–10.5)

## 2014-09-08 LAB — HEPATIC FUNCTION PANEL
ALBUMIN: 3.2 g/dL — AB (ref 3.5–5.0)
ALT: 18 U/L (ref 17–63)
AST: 65 U/L — ABNORMAL HIGH (ref 15–41)
Alkaline Phosphatase: 81 U/L (ref 38–126)
Bilirubin, Direct: <0.1 mg/dL — ABNORMAL LOW (ref 0.1–0.5)
TOTAL PROTEIN: 8.1 g/dL (ref 6.5–8.1)
Total Bilirubin: 0.3 mg/dL (ref 0.3–1.2)

## 2014-09-08 LAB — BASIC METABOLIC PANEL
Anion gap: 8 (ref 5–15)
BUN: 82 mg/dL — ABNORMAL HIGH (ref 6–20)
CALCIUM: 9.7 mg/dL (ref 8.9–10.3)
CHLORIDE: 116 mmol/L — AB (ref 101–111)
CO2: 19 mmol/L — AB (ref 22–32)
Creatinine, Ser: 2.43 mg/dL — ABNORMAL HIGH (ref 0.61–1.24)
GFR calc Af Amer: 28 mL/min — ABNORMAL LOW (ref >60)
GFR calc non Af Amer: 24 mL/min — ABNORMAL LOW (ref >60)
Glucose, Bld: 125 mg/dL — ABNORMAL HIGH (ref 65–99)
POTASSIUM: 4.8 mmol/L (ref 3.5–5.1)
Sodium: 143 mmol/L (ref 135–145)

## 2014-09-08 LAB — URINALYSIS, ROUTINE W REFLEX MICROSCOPIC
BILIRUBIN URINE: NEGATIVE
GLUCOSE, UA: NEGATIVE mg/dL
KETONES UR: NEGATIVE mg/dL
Leukocytes, UA: NEGATIVE
Nitrite: NEGATIVE
Protein, ur: NEGATIVE mg/dL
Specific Gravity, Urine: 1.015 (ref 1.005–1.030)
Urobilinogen, UA: 0.2 mg/dL (ref 0.0–1.0)
pH: 5 (ref 5.0–8.0)

## 2014-09-08 LAB — AMMONIA: AMMONIA: 61 umol/L — AB (ref 9–35)

## 2014-09-08 LAB — URINE MICROSCOPIC-ADD ON

## 2014-09-08 LAB — TROPONIN I: Troponin I: 0.03 ng/mL (ref <0.031)

## 2014-09-08 MED ORDER — SODIUM CHLORIDE 0.9 % IV SOLN
INTRAVENOUS | Status: DC
  Administered 2014-09-08: 21:00:00 via INTRAVENOUS

## 2014-09-08 MED ORDER — ALBUTEROL SULFATE (2.5 MG/3ML) 0.083% IN NEBU: 2.5000 mg | INHALATION_SOLUTION | Freq: Four times a day (QID) | RESPIRATORY_TRACT | Status: DC | PRN

## 2014-09-08 MED ORDER — SODIUM CHLORIDE 0.9 % IJ SOLN
3.0000 mL | Freq: Two times a day (BID) | INTRAMUSCULAR | Status: DC
  Administered 2014-09-08: 3 mL via INTRAVENOUS

## 2014-09-08 MED ORDER — FLUTICASONE PROPIONATE 50 MCG/ACT NA SUSP
2.0000 | Freq: Every day | NASAL | Status: DC
  Administered 2014-09-09: 2 via NASAL
  Filled 2014-09-08: qty 16

## 2014-09-08 MED ORDER — TAMSULOSIN HCL 0.4 MG PO CAPS
0.4000 mg | ORAL_CAPSULE | Freq: Every day | ORAL | Status: DC
  Administered 2014-09-08 – 2014-09-09 (×2): 0.4 mg via ORAL
  Filled 2014-09-08 (×2): qty 1

## 2014-09-08 MED ORDER — SODIUM CHLORIDE 0.9 % IV BOLUS (SEPSIS)
500.0000 mL | Freq: Once | INTRAVENOUS | Status: AC
  Administered 2014-09-08: 500 mL via INTRAVENOUS

## 2014-09-08 MED ORDER — METOPROLOL TARTRATE 25 MG PO TABS
25.0000 mg | ORAL_TABLET | Freq: Two times a day (BID) | ORAL | Status: DC
  Administered 2014-09-08 – 2014-09-09 (×2): 25 mg via ORAL
  Filled 2014-09-08 (×3): qty 1

## 2014-09-08 MED ORDER — CARBIDOPA-LEVODOPA 25-100 MG PO TABS
1.0000 | ORAL_TABLET | Freq: Three times a day (TID) | ORAL | Status: DC
  Administered 2014-09-08 – 2014-09-09 (×3): 1 via ORAL
  Filled 2014-09-08 (×5): qty 1

## 2014-09-08 MED ORDER — FESOTERODINE FUMARATE ER 4 MG PO TB24
4.0000 mg | ORAL_TABLET | Freq: Every day | ORAL | Status: DC
  Administered 2014-09-08 – 2014-09-09 (×2): 4 mg via ORAL
  Filled 2014-09-08 (×2): qty 1

## 2014-09-08 MED ORDER — CYCLOSPORINE 0.05 % OP EMUL
1.0000 [drp] | Freq: Two times a day (BID) | OPHTHALMIC | Status: DC
  Administered 2014-09-08 – 2014-09-09 (×2): 1 [drp] via OPHTHALMIC
  Filled 2014-09-08 (×3): qty 1

## 2014-09-08 NOTE — ED Notes (Signed)
Pt in CT.

## 2014-09-08 NOTE — ED Notes (Signed)
Removed pt's C collar per verbal order from MD

## 2014-09-08 NOTE — ED Notes (Signed)
Attempted to obtain occult blood- pt had no feces in rectum.

## 2014-09-08 NOTE — Progress Notes (Signed)
Adm pt from ED AAox3 VS taken oriented to call bell and room . TELE on. SR up x3. Wife at bedside.

## 2014-09-08 NOTE — Telephone Encounter (Signed)
Received call from [place], RT on L1 that the patient's wife called and cancelled radiation for today. The wife told Drue Dun, RT her husband fell. Phoned patient's home and got no answer. Discovered patient is at [facility] in the ED. Will continue to monitor patient status. Informed therapist and Dr. [name] of this finding.

## 2014-09-08 NOTE — ED Provider Notes (Signed)
CSN: [id number]     Arrival date & time 09/08/14  1437 History   First MD Initiated Contact with Patient 09/08/14 1457     Chief Complaint  Patient presents with  . Fall     (Consider location/radiation/quality/duration/timing/severity/associated sxs/prior Treatment) HPI Comments: PT with hx of prostate ca and HTN presents after a fall.  Per his wife they were starting to leave the house to go to his radiation treatment and he fell backward striking his head on the floor. There is no loss of consciousness. The wife states since yesterday he's not been feeling well and has been generally weak. He's had some slurred speech since yesterday. She noted that was a little bit better today but that started yesterday afternoon. She hasn't noted any other recent falls. She doesn't note any unilateral weakness. She does note that he's been more confused than normal since yesterday. She hasn't noticed any facial drooping. No other recent illnesses. No nausea vomiting or diarrhea. No known fevers. No history of stroke in the past. He was brought in by EMS in a KED and c-collar in place.  He is on Sinemet for Parkinson's with gait disturbance.  He recently started Morphine, but has not taken any today.  Patient is a 74 y.o. male presenting with fall.  Fall    Past Medical History  Diagnosis Date  . ALLERGIC RHINITIS 03/28/2009  . ANXIETY 03/28/2009  . CHOLELITHIASIS 03/28/2009  . DEGENERATIVE JOINT DISEASE 03/28/2009  . DEPRESSION 03/28/2009  . [place] DISEASE, LUMBAR 03/28/2009  . DIVERTICULOSIS, COLON 03/28/2009  . 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 FATIGUE 03/28/2009  . HEPATITIS C 03/28/2009  . HYPERLIPIDEMIA 03/28/2009  . HYPERTENSION 03/28/2009  . LOW BACK PAIN, CHRONIC 03/28/2009  . PERIPHERAL VASCULAR DISEASE 03/28/2009  . Personal history of alcoholism 03/28/2009  . RENAL INSUFFICIENCY 03/28/2009  . THROMBOCYTOPENIA 03/28/2009  . Wheezing 02/07/2010  . CHF (congestive heart failure)   . Impaired glucose  tolerance 12/01/2013  . Prostate cancer    Past Surgical History  Procedure Laterality Date  . Right shoulder surgery  2003  . Cataract extraction, bilateral    . Prostate biopsy    . Colonoscopy w/ polypectomy  06/29/2014    polpys proved to be non cancerous done at [facility] History  Problem Relation Age of Onset  . Lung cancer Father   . Stroke Brother   . Breast cancer Daughter    History  Substance Use Topics  . Smoking status: Former Smoker -- 0.25 packs/day    Types: Cigarettes    Quit date: 03/27/1995  . Smokeless tobacco: Never Used  . Alcohol Use: No     Comment: quit drinking 1997    Review of Systems  Unable to perform ROS: Mental status change      Allergies  Tramadol  Home Medications   Prior to Admission medications   Medication Sig Start Date End Date Taking? Authorizing Provider  albuterol (PROVENTIL HFA;VENTOLIN HFA) 108 (90 BASE) MCG/ACT inhaler Inhale 2 puffs into the lungs every 6 (six) hours as needed for wheezing or shortness of breath. Dispense Ventolin HFA please 06/01/14  Yes [name], MD  carbidopa-levodopa (SINEMET IR) 25-100 MG per tablet Take 1 tablet by mouth 3 (three) times daily. 07/02/14  Yes [name], DO  citalopram (CELEXA) 10 MG tablet take 1 tablet by mouth once daily 04/19/14  Yes [name], MD  cycloSPORINE (RESTASIS) 0.05 % ophthalmic emulsion Place 1 drop into both  eyes 2 (two) times daily.   Yes Historical Provider, MD  diazepam (VALIUM) 10 MG tablet Take 10 mg by mouth every 12 (twelve) hours as needed for anxiety.  07/16/14  Yes Historical Provider, MD  Diclofenac Sodium 2 % SOLN Apply 1 pump twice daily. 02/25/14  Yes Lyndal Pulley, DO  fluticasone (FLONASE) 50 MCG/ACT nasal spray Place 2 sprays into both nostrils daily.   Yes Historical Provider, MD  furosemide (LASIX) 40 MG tablet take 1 tablet by mouth once daily 05/25/14  Yes [name], MD  gabapentin (NEURONTIN) 100 MG capsule Take 2 capsules (200 mg total) by  mouth 3 (three) times daily. 09/06/14  Yes [name], MD  hydrocortisone 2.5 % ointment Apply to affected area BID. 02/15/14  Yes Lyndal Pulley, DO  ibuprofen (ADVIL,MOTRIN) 800 MG tablet Take 800 mg by mouth 2 (two) times daily.   Yes Historical Provider, MD  LORazepam (ATIVAN) 2 MG tablet Take 1 tablet (2 mg total) by mouth every 6 (six) hours as needed for anxiety (Take one tablet by mouth thirty minutes prior to radiation treatment.). 09/01/14  Yes Tyler Pita, MD  metoprolol tartrate (LOPRESSOR) 25 MG tablet take 1 tablet by mouth twice a day 07/21/14  Yes [name], MD  morphine (MSIR) 15 MG tablet Take 1 tablet (15 mg total) by mouth every 4 (four) hours as needed for severe pain (Take 30 min prior to radiation). 09/02/14  Yes Tyler Pita, MD  potassium chloride (K-DUR) 10 MEQ tablet Take 1 tablet (10 mEq total) by mouth 2 (two) times daily. Patient taking differently: Take 10 mEq by mouth daily.  04/30/13  Yes [name], MD  tamsulosin [facility]) 0.4 MG CAPS capsule take 1 capsule by mouth once daily 11/27/13  Yes [name], MD  TOVIAZ 4 MG TB24 tablet Take 4 mg by mouth daily. 05/06/14  Yes Historical Provider, MD  sulfamethoxazole-trimethoprim (BACTRIM,SEPTRA) 400-80 MG per tablet  07/16/14   Historical Provider, MD   BP 166/86 mmHg  Pulse 46  Temp(Src) 97.4 F (36.3 C) (Oral)  Resp 17  SpO2 100% Physical Exam  Constitutional: He appears well-developed and well-nourished.  HENT:  Head: Normocephalic and atraumatic.  Eyes: Pupils are equal, round, and reactive to light.  Neck: Normal range of motion. Neck supple.  Cardiovascular: Normal rate, regular rhythm and normal heart sounds.   Pulmonary/Chest: Effort normal and breath sounds normal. No respiratory distress. He has no wheezes. He has no rales. He exhibits no tenderness.  Abdominal: Soft. Bowel sounds are normal. There is no tenderness. There is no rebound and no guarding.  Musculoskeletal: Normal range of motion. He  exhibits no edema.  Lymphadenopathy:    He has no cervical adenopathy.  Neurological: He is alert.  Patient awake with his eyes closed and will answers questions but is difficult to understand. He is oriented to person only. He knows he is in a hospital but doesn't know the name of the hospital. He doesn't know the date. There is no obvious facial drooping. He has generalized weakness with 4 out of 5 strength in the upper extremities and 3 out of 5 strength in the lower extremities. Sensation appears to be intact to light touch in all extremities although patient is not following commands well.  Skin: Skin is warm and dry. No rash noted.  Psychiatric: He has a normal mood and affect.    ED Course  Procedures (including critical care time) Labs Review Results  for orders placed or performed during the hospital encounter of 09/08/14  CBC with Differential/Platelet  Result Value Ref Range   WBC 5.0 4.0 - 10.5 K/uL   RBC 3.53 (L) 4.22 - 5.81 MIL/uL   Hemoglobin 9.5 (L) 13.0 - 17.0 g/dL   HCT 30.3 (L) 39.0 - 52.0 %   MCV 85.8 78.0 - 100.0 fL   MCH 26.9 26.0 - 34.0 pg   MCHC 31.4 30.0 - 36.0 g/dL   RDW 15.6 (H) 11.5 - 15.5 %   Platelets 80 (L) 150 - 400 K/uL   Neutrophils Relative % 53 43 - 77 %   Neutro Abs 2.7 1.7 - 7.7 K/uL   Lymphocytes Relative 32 12 - 46 %   Lymphs Abs 1.6 0.7 - 4.0 K/uL   Monocytes Relative 10 3 - 12 %   Monocytes Absolute 0.5 0.1 - 1.0 K/uL   Eosinophils Relative 4 0 - 5 %   Eosinophils Absolute 0.2 0.0 - 0.7 K/uL   Basophils Relative 0 0 - 1 %   Basophils Absolute 0.0 0.0 - 0.1 K/uL  Basic metabolic panel  Result Value Ref Range   Sodium 143 135 - 145 mmol/L   Potassium 4.8 3.5 - 5.1 mmol/L   Chloride 116 (H) 101 - 111 mmol/L   CO2 19 (L) 22 - 32 mmol/L   Glucose, Bld 125 (H) 65 - 99 mg/dL   BUN 82 (H) 6 - 20 mg/dL   Creatinine, Ser 2.43 (H) 0.61 - 1.24 mg/dL   Calcium 9.7 8.9 - 10.3 mg/dL   GFR calc non Af Amer 24 (L) >60 mL/min   GFR calc Af Amer 28  (L) >60 mL/min   Anion gap 8 5 - 15  Troponin I  Result Value Ref Range   Troponin I 0.03 <0.031 ng/mL   Dg Chest 2 View  09/08/2014   CLINICAL DATA:  Confusion, fall  EXAM: CHEST  2 VIEW  COMPARISON:  02/16/2014  FINDINGS: Cardiomediastinal silhouette is stable. No acute infiltrate or pleural effusion. No pulmonary edema. Stable postsurgical changes distal right clavicle.  IMPRESSION: No active cardiopulmonary disease.   Electronically Signed   By: Lahoma Crocker M.D.   On: 09/08/2014 15:46   Ct Head Wo Contrast  09/08/2014   CLINICAL DATA:  74 year old male with intermittent stroke-like symptoms and recent fall  EXAM: CT HEAD WITHOUT CONTRAST  CT CERVICAL SPINE WITHOUT CONTRAST  TECHNIQUE: Multidetector CT imaging of the head and cervical spine was performed following the standard protocol without intravenous contrast. Multiplanar CT image reconstructions of the cervical spine were also generated.  COMPARISON:  Prior CT head and cervical spine 08/11/2013  FINDINGS: CT HEAD FINDINGS  Negative for acute intracranial hemorrhage, acute infarction, mass, mass effect, hydrocephalus or midline shift. Gray-white differentiation is preserved throughout. No focal soft tissue or calvarial abnormality. Globes and orbits are symmetric and intact bilaterally. Normal aeration of the mastoid air cells and paranasal sinuses.  CT CERVICAL SPINE FINDINGS  No acute fracture, malalignment or prevertebral soft tissue swelling. Degenerative change at the atlantodental interval with partial ossification of the cruciate ligaments. Multilevel degenerative disc disease with calcification of the anterior and posterior annulus fibrosis at multiple levels. Mild degenerative endplate spurring. Mild multilevel facet arthropathy. Unremarkable CT appearance of the thyroid gland. No acute soft tissue abnormality. 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 Atherosclerotic calcifications in both carotid bifurcations. Degenerative  change at the right sternoclavicular junction similar compared to May of 2015.  IMPRESSION: CT  HEAD  1. Negative CT CSPINE  1. No acute fracture or malalignment. 2. 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 3. 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   Electronically Signed   By: Jacqulynn Cadet M.D.   On: 09/08/2014 15:12   Ct Cervical Spine Wo Contrast  09/08/2014   CLINICAL DATA:  74 year old male with intermittent stroke-like symptoms and recent fall  EXAM: CT HEAD WITHOUT CONTRAST  CT CERVICAL SPINE WITHOUT CONTRAST  TECHNIQUE: Multidetector CT imaging of the head and cervical spine was performed following the standard protocol without intravenous contrast. Multiplanar CT image reconstructions of the cervical spine were also generated.  COMPARISON:  Prior CT head and cervical spine 08/11/2013  FINDINGS: CT HEAD FINDINGS  Negative for acute intracranial hemorrhage, acute infarction, mass, mass effect, hydrocephalus or midline shift. Gray-white differentiation is preserved throughout. No focal soft tissue or calvarial abnormality. Globes and orbits are symmetric and intact bilaterally. Normal aeration of the mastoid air cells and paranasal sinuses.  CT CERVICAL SPINE FINDINGS  No acute fracture, malalignment or prevertebral soft tissue swelling. Degenerative change at the atlantodental interval with partial ossification of the cruciate ligaments. Multilevel degenerative disc disease with calcification of the anterior and posterior annulus fibrosis at multiple levels. Mild degenerative endplate spurring. Mild multilevel facet arthropathy. Unremarkable CT appearance of the thyroid gland. No acute soft tissue abnormality. 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 Atherosclerotic calcifications in both carotid bifurcations. Degenerative change at the right sternoclavicular junction similar compared to May of 2015.  IMPRESSION: CT HEAD  1. Negative CT CSPINE   1. No acute fracture or malalignment. 2. 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 3. 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   Electronically Signed   By: Jacqulynn Cadet M.D.   On: 09/08/2014 15:12      Imaging Review Dg Chest 2 View  09/08/2014   CLINICAL DATA:  Confusion, fall  EXAM: CHEST  2 VIEW  COMPARISON:  02/16/2014  FINDINGS: Cardiomediastinal silhouette is stable. No acute infiltrate or pleural effusion. No pulmonary edema. Stable postsurgical changes distal right clavicle.  IMPRESSION: No active cardiopulmonary disease.   Electronically Signed   By: Lahoma Crocker M.D.   On: 09/08/2014 15:46   Ct Head Wo Contrast  09/08/2014   CLINICAL DATA:  74 year old male with intermittent stroke-like symptoms and recent fall  EXAM: CT HEAD WITHOUT CONTRAST  CT CERVICAL SPINE WITHOUT CONTRAST  TECHNIQUE: Multidetector CT imaging of the head and cervical spine was performed following the standard protocol without intravenous contrast. Multiplanar CT image reconstructions of the cervical spine were also generated.  COMPARISON:  Prior CT head and cervical spine 08/11/2013  FINDINGS: CT HEAD FINDINGS  Negative for acute intracranial hemorrhage, acute infarction, mass, mass effect, hydrocephalus or midline shift. Gray-white differentiation is preserved throughout. No focal soft tissue or calvarial abnormality. Globes and orbits are symmetric and intact bilaterally. Normal aeration of the mastoid air cells and paranasal sinuses.  CT CERVICAL SPINE FINDINGS  No acute fracture, malalignment or prevertebral soft tissue swelling. Degenerative change at the atlantodental interval with partial ossification of the cruciate ligaments. Multilevel degenerative disc disease with calcification of the anterior and posterior annulus fibrosis at multiple levels. Mild degenerative endplate spurring. Mild multilevel facet arthropathy. Unremarkable CT appearance of the thyroid gland.  No acute soft tissue abnormality. 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 Atherosclerotic calcifications in both carotid bifurcations. Degenerative change at the right sternoclavicular junction similar compared to May of  2015.  IMPRESSION: CT HEAD  1. Negative CT CSPINE  1. No acute fracture or malalignment. 2. 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 3. 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   Electronically Signed   By: Jacqulynn Cadet M.D.   On: 09/08/2014 15:12   Ct Cervical Spine Wo Contrast  09/08/2014   CLINICAL DATA:  74 year old male with intermittent stroke-like symptoms and recent fall  EXAM: CT HEAD WITHOUT CONTRAST  CT CERVICAL SPINE WITHOUT CONTRAST  TECHNIQUE: Multidetector CT imaging of the head and cervical spine was performed following the standard protocol without intravenous contrast. Multiplanar CT image reconstructions of the cervical spine were also generated.  COMPARISON:  Prior CT head and cervical spine 08/11/2013  FINDINGS: CT HEAD FINDINGS  Negative for acute intracranial hemorrhage, acute infarction, mass, mass effect, hydrocephalus or midline shift. Gray-white differentiation is preserved throughout. No focal soft tissue or calvarial abnormality. Globes and orbits are symmetric and intact bilaterally. Normal aeration of the mastoid air cells and paranasal sinuses.  CT CERVICAL SPINE FINDINGS  No acute fracture, malalignment or prevertebral soft tissue swelling. Degenerative change at the atlantodental interval with partial ossification of the cruciate ligaments. Multilevel degenerative disc disease with calcification of the anterior and posterior annulus fibrosis at multiple levels. Mild degenerative endplate spurring. Mild multilevel facet arthropathy. Unremarkable CT appearance of the thyroid gland. No acute soft tissue abnormality. 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 Atherosclerotic calcifications in  both carotid bifurcations. Degenerative change at the right sternoclavicular junction similar compared to May of 2015.  IMPRESSION: CT HEAD  1. Negative CT CSPINE  1. No acute fracture or malalignment. 2. 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 3. 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   Electronically Signed   By: Jacqulynn Cadet M.D.   On: 09/08/2014 15:12     EKG Interpretation   Date/Time:  Wednesday September 08 2014 14:58:26 EDT Ventricular Rate:  90 PR Interval:  220 QRS Duration: 82 QT Interval:  363 QTC Calculation: 444 R Axis:   71 Text Interpretation:  Sinus rhythm Ventricular premature complex Prolonged  PR interval Nonspecific repol abnormality, inferior leads Borderline ST  elevation, anterior leads since last tracing no significant change  Confirmed by Nataliyah Packham  MD, Rufino Staup ([id number]) on 09/08/2014 3:10:00 PM      MDM   Final diagnoses:  Confusion  Weakness    CT neg, no evidence of injury from fall.  Still with mild confusion, slurred speech.  Labs with mild anemia, no gross blood in stool.  Will admit to hospitalist service    Malvin Johns, MD 09/08/14 [phone]

## 2014-09-08 NOTE — ED Provider Notes (Signed)
I was asked to see this patient in evaluation at "the bridge" as he entered from the ambulance bay.  Patient apparently had slurred speech yesterday which resolved. He has some difficulty walking yesterday again which resolved. He had difficulty  this morning and was speaking with his wife and walking normally. He then seemed to become less active and less responsive, and he has  been nonverbal since approximately 8:00 this morning. Despite review extensively with paramedics, and their review with family, I wase unable to establish an exact time of known normal, but it sounds as though the patient had symptoms upon awakening this morning. He then had a fall at home where he was walking forward and then stumbled and went backwards. He was still on the floor upon arrival of first responders. Placed in a K ED and cervical collar by paramedics and transferred here. A normal blood sugar. Sinus rhythm with occasional PVCs.  On exam he will open close his eyes and smile at my request. He will squeeze both hands and lift both feet off the bed. He is nonverbal.  CT scan of the head and neck ordered.  Tanna Furry, MD 09/08/14 [phone]

## 2014-09-08 NOTE — Progress Notes (Signed)
Paged K. Schoor of [facility] regarding Pts 14 beats of Vtach. Pt asymptomatic.

## 2014-09-08 NOTE — ED Notes (Signed)
Lab called to add on hepatic function. 

## 2014-09-08 NOTE — ED Notes (Signed)
Per wife patient and family was getting read to go to cancer (prostate) for chemotherapy when patient walking became wobley and patient fell backwards and fell on buttocks in the kitchen. Denies LOC, but was having shallow breathing.   Per EMS called out for fall pt endorsed "not feeling well" lost balance and fell backwards. Pt very lethargic, and level of consciousness varies from baseline. Yesterday at 2pm family noted episode of slurred speech. Pt generalized weakness. No facial droop, speech mildly slurred, grip strengths are equally weak, pt moving all extremities, but generally weak all over. Pupils equal and reactive.   EKG- intermittent bigeminy and first degree AV block Respirations- Lung sounds clear, pt has prolonged exhale

## 2014-09-08 NOTE — H&P (Signed)
[facility] History and Physical  AMER ALCINDOR QIH:[id number] DOB: 1940-10-13 DOA: 09/08/2014  Referring physician: Dr Tamera Punt PCP: [name], MD  Specialists:  Dr [name] nephrology Dr. Carles Collet Neuro Dr. Letta Pate PMR  74 y/o ? known chr Low back pain foll Dr. Juliene Pina in multiple falls 2014/2015-at baseline supposed to use walker and dependant on help for ADLs recent Prostate ca on cycle 5/40 XRT He has a h/o of Hepatitis C + TCP-unclear if he was Rx for this anytime-US done in 2015 showed no specific findings other than non obs GB stones He recently was  with PD and takes his sinemet carbidopa/levodopa 25/100 bid even though he is to take it tid per Dt. Tat He was hospitalized ion 04/20/13 for AKI leading to Falls and orthostasis   patient was prescribedMSIR 15 mg every 4 when necessary for pain allegedly on 09/01/14 while he was at the cancer center as he could not lay still on the tableto perform XRT Over the past one day or so he has had a decreased appetite and according to his wife become a little bit more confused and less amenable to doing anything per wife, went to XRT on 6/14 and was confused after being given MSIR and had to be wheeled out in the wheelchair He awoke the morning of 09/08/14 and did not want a breakfast and in fact overturned the food and seemed confused. He was not talking clearly. He was slurring his speech but there was no focal neurological deficit in terms of power and he was still able to move around He did not sleep well overnight 6/14 and hence missed his Sinemet dose on 6/15/a.m. At around 1300 this afternoon while he was getting ready to go the cancer center his wife noticed that he was swaying and stumbling backwards. it is noted that patient is on multiple medications such as Celexa 10 mg, Valium 10 mg, lorazepam 2 mg, MSIR 15 mg as well as gabapentin 200 mg 3 times a day.   ROS unreliable Patient was slurred earleir in ED and  confused and had echolalia which has subsequently much improved  Past Medical History  Diagnosis Date  . ALLERGIC RHINITIS 03/28/2009  . ANXIETY 03/28/2009  . CHOLELITHIASIS 03/28/2009  . DEGENERATIVE JOINT DISEASE 03/28/2009  . DEPRESSION 03/28/2009  . 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 DIVERTICULOSIS, COLON 03/28/2009  . 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 FATIGUE 03/28/2009  . HEPATITIS C 03/28/2009  . HYPERLIPIDEMIA 03/28/2009  . HYPERTENSION 03/28/2009  . LOW BACK PAIN, CHRONIC 03/28/2009  . PERIPHERAL VASCULAR DISEASE 03/28/2009  . Personal history of alcoholism 03/28/2009  . RENAL INSUFFICIENCY 03/28/2009  . THROMBOCYTOPENIA 03/28/2009  . Wheezing 02/07/2010  . CHF (congestive heart failure)   . Impaired glucose tolerance 12/01/2013  . Prostate cancer    Past Surgical History  Procedure Laterality Date  . Right shoulder surgery  2003  . Cataract extraction, bilateral    . Prostate biopsy    . 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 Tramadol Nausea Only    Family History  Problem Relation Age of Onset  . Lung cancer Father   . Stroke  Brother   . Breast cancer Daughter     Prior to Admission medications   Medication Sig Start Date End Date Taking? Authorizing Provider  albuterol (PROVENTIL HFA;VENTOLIN HFA) 108 (90 BASE) MCG/ACT inhaler Inhale 2 puffs into the lungs every 6 (six) hours as needed for wheezing or shortness of breath. Dispense Ventolin HFA please 06/01/14  Yes [name], MD  carbidopa-levodopa (SINEMET IR) 25-100 MG per tablet Take 1 tablet by mouth 3 (three) times daily. 07/02/14  Yes [name], DO  citalopram (CELEXA) 10 MG tablet take 1 tablet by mouth once daily 04/19/14  Yes [name], MD  cycloSPORINE (RESTASIS)  0.05 % ophthalmic emulsion Place 1 drop into both eyes 2 (two) times daily.   Yes Historical Provider, MD  diazepam (VALIUM) 10 MG tablet Take 10 mg by mouth every 12 (twelve) hours as needed for anxiety.  07/16/14  Yes Historical Provider, MD  Diclofenac Sodium 2 % SOLN Apply 1 pump twice daily. 02/25/14  Yes Lyndal Pulley, DO  fluticasone (FLONASE) 50 MCG/ACT nasal spray Place 2 sprays into both nostrils daily.   Yes Historical Provider, MD  furosemide (LASIX) 40 MG tablet take 1 tablet by mouth once daily 05/25/14  Yes [name], MD  gabapentin (NEURONTIN) 100 MG capsule Take 2 capsules (200 mg total) by mouth 3 (three) times daily. 09/06/14  Yes [name], MD  hydrocortisone 2.5 % ointment Apply to affected area BID. 02/15/14  Yes Lyndal Pulley, DO  ibuprofen (ADVIL,MOTRIN) 800 MG tablet Take 800 mg by mouth 2 (two) times daily.   Yes Historical Provider, MD  LORazepam (ATIVAN) 2 MG tablet Take 1 tablet (2 mg total) by mouth every 6 (six) hours as needed for anxiety (Take one tablet by mouth thirty minutes prior to radiation treatment.). 09/01/14  Yes Tyler Pita, MD  metoprolol tartrate (LOPRESSOR) 25 MG tablet take 1 tablet by mouth twice a day 07/21/14  Yes [name], MD  morphine (MSIR) 15 MG tablet Take 1 tablet (15 mg total) by mouth every 4 (four) hours as needed for severe pain (Take 30 min prior to radiation). 09/02/14  Yes Tyler Pita, MD  potassium chloride (K-DUR) 10 MEQ tablet Take 1 tablet (10 mEq total) by mouth 2 (two) times daily. Patient taking differently: Take 10 mEq by mouth daily.  04/30/13  Yes [name], MD  tamsulosin [facility]) 0.4 MG CAPS capsule take 1 capsule by mouth once daily 11/27/13  Yes [name], MD  TOVIAZ 4 MG TB24 tablet Take 4 mg by mouth daily. 05/06/14  Yes Historical Provider, MD  sulfamethoxazole-trimethoprim (BACTRIM,SEPTRA) 400-80 MG per tablet  07/16/14   Historical Provider, MD   Physical Exam: Filed Vitals:   09/08/14 1715 09/08/14 1730  09/08/14 1800 09/08/14 1830  BP: 155/87 139/72 166/86 153/82  Pulse: 65 68 46 35  Temp:      TempSrc:      Resp: 12 25 17 12   SpO2: 98% 98% 100% 99%    Awakens but is a little confused-cannot really oriented well and hence I cannot ask him any orienting questions Bi-temporalis wasting, scleral icterus present, pupils are contracted about 3 mm Smile is symmetric Tongue protrudes but he has Mallampati stage 2-3 No JVD no bruit S1-S2 no murmur Patient has a forced wheeze however I do not hear any wheezes otherwise Abdomen is soft nontender nondistended Power-patient is able to raise each leg upwards on command and has 5/5 power  bilaterally Upper extremity power is 5/5 Patient is able to shrug her shoulders and has 5/5 power in biceps and triceps Reflexes were not tested  Labs on Admission:  Basic Metabolic Panel:  Recent Labs Lab 09/08/14 1505  NA 143  K 4.8  CL 116*  CO2 19*  GLUCOSE 125*  BUN 82*  CREATININE 2.43*  CALCIUM 9.7   Liver Function Tests: No results for input(s): AST, ALT, ALKPHOS, BILITOT, PROT, ALBUMIN in the last 168 hours. No results for input(s): LIPASE, AMYLASE in the last 168 hours. No results for input(s): AMMONIA in the last 168 hours. CBC:  Recent Labs Lab 09/08/14 1505  WBC 5.0  NEUTROABS 2.7  HGB 9.5*  HCT 30.3*  MCV 85.8  PLT 80*   Cardiac Enzymes:  Recent Labs Lab 09/08/14 1505  TROPONINI 0.03    BNP (last 3 results) No results for input(s): BNP in the last 8760 hours.  ProBNP (last 3 results) No results for input(s): PROBNP in the last 8760 hours.  CBG: No results for input(s): GLUCAP in the last 168 hours.  Radiological Exams on Admission: Dg Chest 2 View  09/08/2014   CLINICAL DATA:  Confusion, fall  EXAM: CHEST  2 VIEW  COMPARISON:  02/16/2014  FINDINGS: Cardiomediastinal silhouette is stable. No acute infiltrate or pleural effusion. No pulmonary edema. Stable postsurgical changes distal right clavicle.  IMPRESSION:  No active cardiopulmonary disease.   Electronically Signed   By: Lahoma Crocker M.D.   On: 09/08/2014 15:46   Ct Head Wo Contrast  09/08/2014   CLINICAL DATA:  74 year old male with intermittent stroke-like symptoms and recent fall  EXAM: CT HEAD WITHOUT CONTRAST  CT CERVICAL SPINE WITHOUT CONTRAST  TECHNIQUE: Multidetector CT imaging of the head and cervical spine was performed following the standard protocol without intravenous contrast. Multiplanar CT image reconstructions of the cervical spine were also generated.  COMPARISON:  Prior CT head and cervical spine 08/11/2013  FINDINGS: CT HEAD FINDINGS  Negative for acute intracranial hemorrhage, acute infarction, mass, mass effect, hydrocephalus or midline shift. Gray-white differentiation is preserved throughout. No focal soft tissue or calvarial abnormality. Globes and orbits are symmetric and intact bilaterally. Normal aeration of the mastoid air cells and paranasal sinuses.  CT CERVICAL SPINE FINDINGS  No acute fracture, malalignment or prevertebral soft tissue swelling. Degenerative change at the atlantodental interval with partial ossification of the cruciate ligaments. Multilevel degenerative disc disease with calcification of the anterior and posterior annulus fibrosis at multiple levels. Mild degenerative endplate spurring. Mild multilevel facet arthropathy. Unremarkable CT appearance of the thyroid gland. No acute soft tissue abnormality. 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 Atherosclerotic calcifications in both carotid bifurcations. Degenerative change at the right sternoclavicular junction similar compared to May of 2015.  IMPRESSION: CT HEAD  1. Negative CT CSPINE  1. No acute fracture or malalignment. 2. 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 3. 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   Electronically Signed   By: Jacqulynn Cadet M.D.   On: 09/08/2014 15:12   Ct Cervical Spine Wo  Contrast  09/08/2014   CLINICAL DATA:  74 year old male with intermittent stroke-like symptoms and recent fall  EXAM: CT HEAD WITHOUT CONTRAST  CT CERVICAL SPINE WITHOUT CONTRAST  TECHNIQUE: Multidetector CT imaging of the head and cervical spine was performed following the standard protocol without intravenous contrast. Multiplanar CT image reconstructions of the cervical spine were also generated.  COMPARISON:  Prior CT head and cervical spine 08/11/2013  FINDINGS: CT HEAD FINDINGS  Negative for acute intracranial hemorrhage, acute infarction, mass, mass effect, hydrocephalus or midline shift. Gray-white differentiation is preserved throughout. No focal soft tissue or calvarial abnormality. Globes and orbits are symmetric and intact bilaterally. Normal aeration of the mastoid air cells and paranasal sinuses.  CT CERVICAL SPINE FINDINGS  No acute fracture, malalignment or prevertebral soft tissue swelling. Degenerative change at the atlantodental interval with partial ossification of the cruciate ligaments. Multilevel degenerative disc disease with calcification of the anterior and posterior annulus fibrosis at multiple levels. Mild degenerative endplate spurring. Mild multilevel facet arthropathy. Unremarkable CT appearance of the thyroid gland. No acute soft tissue abnormality. 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 Atherosclerotic calcifications in both carotid bifurcations. Degenerative change at the right sternoclavicular junction similar compared to May of 2015.  IMPRESSION: CT HEAD  1. Negative CT CSPINE  1. No acute fracture or malalignment. 2. 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 3. 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   Electronically Signed   By: Jacqulynn Cadet M.D.   On: 09/08/2014 15:12    EKG: Independently reviewed. Normal sinus rhythm PR interval 0.12 QRS axis 75, T waves appeared peaked there are some ST-T wave inversions  in V5 and 6 which do not seem to be new heart rate was about 90  Assessment/Plan    Toxic metabolic encephalopathy-iatrogenic and secondary to addition of morphine to the cocktail of medications that he is on as per history of present illness -Discontinue all medications with sedating properties as above -IV saline -If persisting mental status change would get MRI of the brain however based on discussion with bedside nurse in the emergency room his mentation is rapidly clearing and he is much better than he was previously    Thrombocytopenia related to hepatitis C -We will get an ammonia level to ensure that he does not have hepatic encephalopathy -I do not think he has been treated for his hepatitis C in the past Bipolar affective disorder- -is on Celexa, diazepam, Ativan which is therapeutic duplication. He may benefit from lower dosing of these medications going forward -Would recommend trying to reduce his polypharmacy and visit psychiatrist as an outpatient    AK I/CKD (chronic kidney disease) stage 3, GFR 30-59 ml/min-followed by Dr. [name] of nephrology -He has a mild increase in his BUN/creatinine from his baseline GFR of 33--->28 currently. We will try to avoid nephrotoxic agents -Patient will need IV saline 50 cc per hour    Prostate cancer managed by Dr.Ottelin urology -Continue Flomax and other urinary medications -He was supposed to be on cycle 5 of 40 of XRT to the abdomen pelvis. I will cc his radiation oncologist Dr. Mercy Moore    Chronic diastolic heart failure -Seems a little dry received IV bolus 500 cc. Monitor ins and outs carefully. Daily weights.    Orthostatic hypotension -Historically has this -We will monitor him for falls and have therapy see him in the morning   Recurrent falls -Multifactorial and secondary to degenerative disc disease -Therapy may need to evaluate him    PD (Parkinson's disease) diagnosed 2015, and follwos with Dr. Alfonso Patten. Tat  -Patient  noncompliant with Sinemet -Gives him diarrhea if he takes it 3 times a day therefore he takes it at 6 AM and at [facility] -We will need to reimplement this as per his home guidelines when he is more awake   No Lovenox SCDs given thrombocytopenia   65 minutes Presumed full code  Discussed in detail and fully with wife at the bedside    Verlon Au [facility] Pager [phone]  If 7PM-7AM, please contact night-coverage [email or web address] Password [facility] 09/08/2014, 7:07 PM

## 2014-09-09 ENCOUNTER — Telehealth: Payer: Self-pay | Admitting: Radiation Oncology

## 2014-09-09 ENCOUNTER — Inpatient Hospital Stay (HOSPITAL_COMMUNITY): Payer: Medicare Other

## 2014-09-09 ENCOUNTER — Ambulatory Visit: Payer: Medicare Other

## 2014-09-09 DIAGNOSIS — F411 Generalized anxiety disorder: Secondary | ICD-10-CM | POA: Diagnosis not present

## 2014-09-09 DIAGNOSIS — F419 Anxiety disorder, unspecified: Secondary | ICD-10-CM | POA: Diagnosis present

## 2014-09-09 DIAGNOSIS — Z79899 Other long term (current) drug therapy: Secondary | ICD-10-CM | POA: Diagnosis not present

## 2014-09-09 DIAGNOSIS — N183 Chronic kidney disease, stage 3 (moderate): Secondary | ICD-10-CM

## 2014-09-09 DIAGNOSIS — N320 Bladder-neck obstruction: Secondary | ICD-10-CM | POA: Diagnosis present

## 2014-09-09 DIAGNOSIS — R410 Disorientation, unspecified: Secondary | ICD-10-CM | POA: Diagnosis present

## 2014-09-09 DIAGNOSIS — G8929 Other chronic pain: Secondary | ICD-10-CM | POA: Diagnosis present

## 2014-09-09 DIAGNOSIS — I129 Hypertensive chronic kidney disease with stage 1 through stage 4 chronic kidney disease, or unspecified chronic kidney disease: Secondary | ICD-10-CM | POA: Diagnosis present

## 2014-09-09 DIAGNOSIS — C61 Malignant neoplasm of prostate: Secondary | ICD-10-CM | POA: Diagnosis present

## 2014-09-09 DIAGNOSIS — F329 Major depressive disorder, single episode, unspecified: Secondary | ICD-10-CM | POA: Diagnosis present

## 2014-09-09 DIAGNOSIS — N179 Acute kidney failure, unspecified: Secondary | ICD-10-CM | POA: Diagnosis present

## 2014-09-09 DIAGNOSIS — Z87891 Personal history of nicotine dependence: Secondary | ICD-10-CM | POA: Diagnosis not present

## 2014-09-09 DIAGNOSIS — G20 Parkinson's disease: Secondary | ICD-10-CM | POA: Diagnosis present

## 2014-09-09 DIAGNOSIS — B1920 Unspecified viral hepatitis C without hepatic coma: Secondary | ICD-10-CM | POA: Diagnosis present

## 2014-09-09 DIAGNOSIS — G92 Toxic encephalopathy: Principal | ICD-10-CM

## 2014-09-09 DIAGNOSIS — Z7951 Long term (current) use of inhaled steroids: Secondary | ICD-10-CM | POA: Diagnosis not present

## 2014-09-09 DIAGNOSIS — Z9114 Patient's other noncompliance with medication regimen: Secondary | ICD-10-CM | POA: Diagnosis present

## 2014-09-09 DIAGNOSIS — M545 Low back pain: Secondary | ICD-10-CM | POA: Diagnosis present

## 2014-09-09 DIAGNOSIS — T402X5A Adverse effect of other opioids, initial encounter: Secondary | ICD-10-CM | POA: Diagnosis present

## 2014-09-09 DIAGNOSIS — E785 Hyperlipidemia, unspecified: Secondary | ICD-10-CM | POA: Diagnosis present

## 2014-09-09 DIAGNOSIS — D6959 Other secondary thrombocytopenia: Secondary | ICD-10-CM | POA: Diagnosis present

## 2014-09-09 DIAGNOSIS — I951 Orthostatic hypotension: Secondary | ICD-10-CM | POA: Diagnosis present

## 2014-09-09 DIAGNOSIS — I5032 Chronic diastolic (congestive) heart failure: Secondary | ICD-10-CM

## 2014-09-09 LAB — COMPREHENSIVE METABOLIC PANEL
ALT: 7 U/L — AB (ref 17–63)
ANION GAP: 9 (ref 5–15)
AST: 57 U/L — ABNORMAL HIGH (ref 15–41)
Albumin: 3 g/dL — ABNORMAL LOW (ref 3.5–5.0)
Alkaline Phosphatase: 69 U/L (ref 38–126)
BILIRUBIN TOTAL: 0.7 mg/dL (ref 0.3–1.2)
BUN: 69 mg/dL — ABNORMAL HIGH (ref 6–20)
CALCIUM: 9.6 mg/dL (ref 8.9–10.3)
CO2: 18 mmol/L — ABNORMAL LOW (ref 22–32)
CREATININE: 2.18 mg/dL — AB (ref 0.61–1.24)
Chloride: 115 mmol/L — ABNORMAL HIGH (ref 101–111)
GFR calc Af Amer: 32 mL/min — ABNORMAL LOW (ref >60)
GFR calc non Af Amer: 28 mL/min — ABNORMAL LOW (ref >60)
GLUCOSE: 120 mg/dL — AB (ref 65–99)
Potassium: 4.7 mmol/L (ref 3.5–5.1)
Sodium: 142 mmol/L (ref 135–145)
Total Protein: 7.7 g/dL (ref 6.5–8.1)

## 2014-09-09 LAB — CBC
HCT: 29.3 % — ABNORMAL LOW (ref 39.0–52.0)
HEMOGLOBIN: 9.1 g/dL — AB (ref 13.0–17.0)
MCH: 26.7 pg (ref 26.0–34.0)
MCHC: 31.1 g/dL (ref 30.0–36.0)
MCV: 85.9 fL (ref 78.0–100.0)
Platelets: 73 10*3/uL — ABNORMAL LOW (ref 150–400)
RBC: 3.41 MIL/uL — ABNORMAL LOW (ref 4.22–5.81)
RDW: 15.6 % — AB (ref 11.5–15.5)
WBC: 5.4 10*3/uL (ref 4.0–10.5)

## 2014-09-09 LAB — AMMONIA: Ammonia: 29 umol/L (ref 9–35)

## 2014-09-09 LAB — PROTIME-INR
INR: 1.27 (ref 0.00–1.49)
Prothrombin Time: 16.1 seconds — ABNORMAL HIGH (ref 11.6–15.2)

## 2014-09-09 MED ORDER — LACTULOSE 10 GM/15ML PO SOLN
20.0000 g | Freq: Once | ORAL | Status: AC
  Administered 2014-09-09: 20 g via ORAL
  Filled 2014-09-09: qty 30

## 2014-09-09 MED ORDER — GABAPENTIN 100 MG PO CAPS: 100.0000 mg | ORAL_CAPSULE | Freq: Three times a day (TID) | ORAL | Status: DC

## 2014-09-09 NOTE — Telephone Encounter (Signed)
Attempted to return message left by patient's wife. Aware patient is hospitalized at [facility]. Radiation treatment held today due to several runs of V-Tach. Will check patient status tomorrow.

## 2014-09-09 NOTE — Evaluation (Signed)
Physical Therapy Evaluation Patient Details Name: Gerald Gomez MRN: [id number] DOB: 08/17/1940 Today's Date: 09/09/2014   History of Present Illness  Patient is a 74 yo male admitted 09/08/14 with toxic metabolic encephalopathy.   PMH:  anxiety, depression, HTN, CHF, PVD, chronic back pain  Clinical Impression  Patient presents with problems listed below.  Patient moving fairly well.  However, cognition continues to be impaired.  See "Cognition" section below.  Spoke with RN and wife.  RN reports patient more clear earlier.  Wife states his cognition gets better and then worse again.  Wife reports OK to discharge home.  RN reviewed with patient to contact [facility] agency or MD with any issues at home.  Per chart, patient to have f/u HHPT, and to d/c today.    Follow Up Recommendations Home health PT;Supervision/Assistance - 24 hour    Equipment Recommendations  None recommended by PT    Recommendations for Other Services       Precautions / Restrictions Precautions Precautions: Fall Precaution Comments: Patient with h/o falls Restrictions Weight Bearing Restrictions: No      Mobility  Bed Mobility                  Transfers Overall transfer level: Needs assistance Equipment used: Rolling walker (2 wheeled) Transfers: Sit to/from Stand Sit to Stand: Supervision         General transfer comment: Supervision for safety  Ambulation/Gait Ambulation/Gait assistance: Min guard Ambulation Distance (Feet): 200 Feet Assistive device: Rolling walker (2 wheeled) Gait Pattern/deviations: Step-through pattern;Decreased stride length;Trunk flexed     General Gait Details: Verbal cues for safe use of RW.  Patient with very flexed posture during gait - cues to stand upright.  At one point, patient started 'running' down hallway with RW.  Stopped patient to discuss safety.  Stairs            Wheelchair Mobility    Modified Rankin (Stroke Patients Only)       Balance                                              Pertinent Vitals/Pain Pain Assessment: No/denies pain    Home Living Family/patient expects to be discharged to:: Private residence Living Arrangements: Spouse/significant other Available Help at Discharge: Family;Available 24 hours/day Type of Home: House Home Access: Stairs to enter Entrance Stairs-Rails: None Entrance Stairs-Number of Steps: 2 Home Layout: One level Home Equipment: Environmental consultant - 2 wheels      Prior Function Level of Independence: Independent with assistive device(s)         Comments: Drives     Hand Dominance        Extremity/Trunk Assessment   Upper Extremity Assessment: Generalized weakness           Lower Extremity Assessment: Generalized weakness      Cervical / Trunk Assessment: Kyphotic  Communication   Communication: No difficulties  Cognition Arousal/Alertness: Awake/alert Behavior During Therapy: Restless;Anxious;Impulsive Overall Cognitive Status: Impaired/Different from baseline Area of Impairment: Orientation;Attention;Memory;Safety/judgement;Problem solving Orientation Level: Disoriented to;Place;Time;Situation Current Attention Level: Sustained Memory: Decreased short-term memory   Safety/Judgement: Decreased awareness of deficits;Decreased awareness of safety   Problem Solving: Slow processing;Difficulty sequencing;Requires verbal cues General Comments: Patient unable to state where he is, his birthday, or why he is in the hospital.  Unable to give accurate information regarding PLOF or  living situation.    General Comments      Exercises        Assessment/Plan    PT Assessment All further PT needs can be met in the next venue of care  PT Diagnosis Abnormality of gait;Altered mental status   PT Problem List Decreased strength;Decreased balance;Decreased mobility;Decreased cognition;Decreased knowledge of use of DME;Decreased safety awareness  PT  Treatment Interventions     PT Goals (Current goals can be found in the Care Plan section) Acute Rehab PT Goals Patient Stated Goal: To go home    Frequency     Barriers to discharge        Co-evaluation               End of Session Equipment Utilized During Treatment: Gait belt Activity Tolerance: Patient tolerated treatment well Patient left: in bed;with family/visitor present;with call bell/phone within reach (sitting EOB) Nurse Communication: Mobility status (Continued confusion. Spoke with wife-cognition comes/goes)         Time: 5241-5901 PT Time Calculation (min) (ACUTE ONLY): 21 min   Charges:   PT Evaluation $Initial PT Evaluation Tier I: 1 Procedure     PT G CodesDespina Pole 2014/09/12, 7:57 PM Carita Pian. Sanjuana Kava, [place] Pager [phone]

## 2014-09-09 NOTE — Progress Notes (Signed)
[facility]                                                                              Patient Demographics  Gerald Gomez, is a 74 y.o. male, DOB - 07-21-1940, OTL:[id number]  Admit date - 09/08/2014   Admitting Physician Nita Sells, MD  Outpatient Primary MD for the patient is [name], MD  LOS -    Chief Complaint  Patient presents with  . Fall       Brief HPI   Patient is a 74 year old male with chronic low back pain, follows Dr. Darrick Meigs resulting in multiple falls in 2014 and 2015, history of hepatitis C, anxiety, hypertension, hyperlipidemia presented to ED with altered mental status. Apparently patient was prescribed MSIR 15 mg every 4 hours as needed for pain on 09/01/58 while he was at the cancer center as he could not lay still for radiation therapy. Over the past one day or so patient complained of decreased appetite and per his wife was becoming more confused and less and able to doing anything. Per his wife, he went to radiation therapy on 6/14 and was confused after being given morphine. On 6/15 he was more confused but no focal neurological deficit. Patient was brought to ED for further workup. Patient is also on multiple medications such as Celexa 10 mg, Valium 10 mg, lorazepam 2 mg, MSIR 15 mg as well as gabapentin 200 mg 3 times a day.   Assessment & Plan    Principal Problem:   Toxic metabolic encephalopathy: Likely due to multiple medications including morphine and has underlying history of chronic kidney disease - Ammonia level slightly elevated at 61 on 6/15, given 1 dose of lactulose, improved to 29. CT head negative for any acute intracranial abnormalities. - Discontinued all sedating medications.  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 [name] of nephrology - Continue gentle hydration, avoid nephrotoxic agents, improving -Baseline creatinine appears to be 2.3     Anxiety state   CKD (chronic kidney disease) stage 3, GFR 30-59 ml/min-followed by Dr. [name] of nephrology  Parkinson's disease - Continue Sinemet, follows with Dr. Carles Collet outpatient  Orthostatic hypotension: Could be due to autonomic dysfunction from Parkinson's disease - Awaiting PT evaluation  Prostate cancer - Continue Flomax, and radiation therapy outpatient   Code Status: Full code   Family Communication: Discussed in detail with the patient, all imaging results, lab results explained to the patient    Disposition Plan: DC in 24 hours  Time Spent in minutes 25 minutes  Procedures  CT head CT cervical spine  Consults   None  DVT Prophylaxis SCD's  Medications  Scheduled Meds: . carbidopa-levodopa  1 tablet Oral TID  . cycloSPORINE  1 drop Both Eyes BID  . fesoterodine  4 mg Oral Daily  . fluticasone  2 spray Each Nare Daily  . metoprolol tartrate  25 mg Oral BID  .  sodium chloride  3 mL Intravenous Q12H  . tamsulosin  0.4 mg Oral Daily   Continuous Infusions: . sodium chloride 50 mL/hr at 09/08/14 2031   PRN Meds:.albuterol   Antibiotics   Anti-infectives    None        Subjective:   Gerald Gomez was seen and examined today. He feels a lot better today, alert and oriented. Patient denies dizziness, chest pain, shortness of breath, abdominal pain, N/V/D/C, new weakness, numbess, tingling. No acute events overnight.    Objective:   Blood pressure 138/79, pulse 95, temperature 97.9 F (36.6 C), temperature source Oral, resp. rate 20, height 6' (1.829 m), weight 94.257 kg (207 lb 12.8 oz), SpO2 96 %.  Wt Readings from Last 3 Encounters:  09/09/14 94.257 kg (207 lb 12.8 oz)  09/03/14 95.936 kg (211 lb 8 oz)  07/15/14 94.394 kg (208 lb 1.6 oz)      Intake/Output Summary (Last 24 hours) at 09/09/14 1143 Last data filed at 09/09/14 0851  Gross per 24 hour  Intake 1154.17 ml  Output   1000 ml  Net 154.17 ml    Exam  General: Alert and oriented x 3, NAD  HEENT:  PERRLA, EOMI, Anicteric Sclera, mucous membranes moist.   Neck: Supple, no JVD, no masses  CVS: S1 S2 auscultated, no rubs, murmurs or gallops. Regular rate and rhythm.  Respiratory: Clear to auscultation bilaterally, no wheezing, rales or rhonchi  Abdomen: Soft, nontender, +distended, + bowel sounds  Ext: no cyanosis clubbing or edema  Neuro: AAOx3, Cr N's II- XII. Strength 5/5 upper and lower extremities bilaterally  Skin: No rashes  Psych: Normal affect and demeanor, alert and oriented x3    Data Review   Micro Results No results found for this or any previous visit (from the past 240 hour(s)).  Radiology Reports Dg Chest 2 View  09/08/2014   CLINICAL DATA:  Confusion, fall  EXAM: CHEST  2 VIEW  COMPARISON:  02/16/2014  FINDINGS: Cardiomediastinal silhouette is stable. No acute infiltrate or pleural effusion. No pulmonary edema. Stable postsurgical changes distal right clavicle.  IMPRESSION: No active cardiopulmonary disease.   Electronically Signed   By: Lahoma Crocker M.D.   On: 09/08/2014 15:46   Ct Head Wo Contrast  09/08/2014   CLINICAL DATA:  74 year old male with intermittent stroke-like symptoms and recent fall  EXAM: CT HEAD WITHOUT CONTRAST  CT CERVICAL SPINE WITHOUT CONTRAST  TECHNIQUE: Multidetector CT imaging of the head and cervical spine was performed following the standard protocol without intravenous contrast. Multiplanar CT image reconstructions of the cervical spine were also generated.  COMPARISON:  Prior CT head and cervical spine 08/11/2013  FINDINGS: CT HEAD FINDINGS  Negative for acute intracranial hemorrhage, acute infarction, mass, mass effect, hydrocephalus or midline shift. Gray-white differentiation is preserved throughout. No  focal soft tissue or calvarial abnormality. Globes and orbits are symmetric and intact bilaterally. Normal aeration of the mastoid air cells and paranasal sinuses.  CT CERVICAL SPINE FINDINGS  No acute fracture, malalignment or prevertebral soft tissue swelling. Degenerative change at the atlantodental interval with partial ossification of the cruciate ligaments. Multilevel degenerative disc disease with calcification of the anterior and posterior annulus fibrosis at multiple levels. Mild degenerative endplate spurring. Mild multilevel facet arthropathy. Unremarkable CT appearance of the thyroid gland. No acute soft tissue abnormality. 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 Atherosclerotic calcifications in both carotid bifurcations. Degenerative change at the right sternoclavicular junction similar compared to May of 2015.  IMPRESSION: CT  HEAD  1. Negative CT CSPINE  1. No acute fracture or malalignment. 2. 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 3. 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   Electronically Signed   By: Jacqulynn Cadet M.D.   On: 09/08/2014 15:12   Ct Cervical Spine Wo Contrast  09/08/2014   CLINICAL DATA:  74 year old male with intermittent stroke-like symptoms and recent fall  EXAM: CT HEAD WITHOUT CONTRAST  CT CERVICAL SPINE WITHOUT CONTRAST  TECHNIQUE: Multidetector CT imaging of the head and cervical spine was performed following the standard protocol without intravenous contrast. Multiplanar CT image reconstructions of the cervical spine were also generated.  COMPARISON:  Prior CT head and cervical spine 08/11/2013  FINDINGS: CT HEAD FINDINGS  Negative for acute intracranial hemorrhage, acute infarction, mass, mass effect, hydrocephalus or midline shift. Gray-white differentiation is preserved throughout. No focal soft tissue or calvarial abnormality. Globes and orbits are symmetric and intact bilaterally. Normal aeration of the  mastoid air cells and paranasal sinuses.  CT CERVICAL SPINE FINDINGS  No acute fracture, malalignment or prevertebral soft tissue swelling. Degenerative change at the atlantodental interval with partial ossification of the cruciate ligaments. Multilevel degenerative disc disease with calcification of the anterior and posterior annulus fibrosis at multiple levels. Mild degenerative endplate spurring. Mild multilevel facet arthropathy. Unremarkable CT appearance of the thyroid gland. No acute soft tissue abnormality. 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 Atherosclerotic calcifications in both carotid bifurcations. Degenerative change at the right sternoclavicular junction similar compared to May of 2015.  IMPRESSION: CT HEAD  1. Negative CT CSPINE  1. No acute fracture or malalignment. 2. 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 3. 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   Electronically Signed   By: Jacqulynn Cadet M.D.   On: 09/08/2014 15:12    CBC  Recent Labs Lab 09/08/14 1505 09/09/14 0550  WBC 5.0 5.4  HGB 9.5* 9.1*  HCT 30.3* 29.3*  PLT 80* 73*  MCV 85.8 85.9  MCH 26.9 26.7  MCHC 31.4 31.1  RDW 15.6* 15.6*  LYMPHSABS 1.6  --   MONOABS 0.5  --   EOSABS 0.2  --   BASOSABS 0.0  --     Chemistries   Recent Labs Lab 09/08/14 1505 09/09/14 0550  NA 143 142  K 4.8 4.7  CL 116* 115*  CO2 19* 18*  GLUCOSE 125* 120*  BUN 82* 69*  CREATININE 2.43* 2.18*  CALCIUM 9.7 9.6  AST 65* 57*  ALT 18 7*  ALKPHOS 81 69  BILITOT 0.3 0.7   ------------------------------------------------------------------------------------------------------------------ estimated creatinine clearance is 34.9 mL/min (by C-G formula based on Cr of 2.18). ------------------------------------------------------------------------------------------------------------------ No results for input(s): HGBA1C in the last 72  hours. ------------------------------------------------------------------------------------------------------------------ No results for input(s): CHOL, HDL, LDLCALC, TRIG, CHOLHDL, LDLDIRECT in the last 72 hours. ------------------------------------------------------------------------------------------------------------------ No results for input(s): TSH, T4TOTAL, T3FREE, THYROIDAB in the last 72 hours.  Invalid input(s): FREET3 ------------------------------------------------------------------------------------------------------------------ No results for input(s): VITAMINB12, FOLATE, FERRITIN, TIBC, IRON, RETICCTPCT in the last 72 hours.  Coagulation profile  Recent Labs Lab 09/09/14 0550  INR 1.27    No results for input(s): DDIMER in the last 72 hours.  Cardiac Enzymes  Recent Labs Lab 09/08/14 1505  TROPONINI 0.03   ------------------------------------------------------------------------------------------------------------------ Invalid input(s): POCBNP  No results for input(s): GLUCAP in the last 72 hours.   Bane Hagy M.D. [facility] 09/09/2014, 11:43 AM  Pager: [phone]   Between 7am to 7pm - call Pager - [phone]  After 7pm  go to [email or web address] - password [email or web address]  Call night coverage person covering after 7pm

## 2014-09-09 NOTE — Discharge Summary (Signed)
Physician Discharge Summary   Patient ID: Gerald Gomez MRN: [id number] DOB/AGE: 07/27/1940 74 y.o.  Admit date: 09/08/2014 Discharge date: 09/09/2014  Primary Care Physician:  [name], MD  Discharge Diagnoses:    . Toxic metabolic encephalopathy . Thrombocytopenia . Anxiety state . CKD (chronic kidney disease) stage 3, GFR 30-59 ml/min-followed by Dr. [name] of nephrology . Bladder neck obstruction . Chronic diastolic heart failure . Renal failure (ARF), acute on chronic . PD (Parkinson's disease) diagnosed 2015, and follwos with Dr. Alfonso Patten. Tat  . GERD (gastroesophageal reflux disease) . Orthostatic hypotension  Consults: None    Recommendations for Outpatient Follow-up:  Patient was on multiple medications such as Celexa 10 mg, Valium 10 mg, lorazepam 2 mg, MSIR 15 mg as well as gabapentin 200 mg 3 times a day.   Celexa has been resumed, gabapentin decreased 100 mg 3 times a day. Valium and Ativan and morphine were discontinued.   Lasix currently on hold, please resume if warranted at the time of follow-up appointment. Please check BMET   DIET: Heart healthy diet    Allergies:   Allergies  Allergen Reactions  . Tramadol Nausea Only     Discharge Medications:   Medication List    STOP taking these medications        diazepam 10 MG tablet  Commonly known as:  VALIUM     furosemide 40 MG tablet  Commonly known as:  LASIX     LORazepam 2 MG tablet  Commonly known as:  ATIVAN     morphine 15 MG tablet  Commonly known as:  MSIR     potassium chloride 10 MEQ tablet  Commonly known as:  K-DUR     sulfamethoxazole-trimethoprim 400-80 MG per tablet  Commonly known as:  BACTRIM,SEPTRA      TAKE these medications        albuterol 108 (90 BASE) MCG/ACT inhaler  Commonly known as:  PROVENTIL HFA;VENTOLIN HFA  Inhale 2 puffs into the lungs every 6 (six) hours as needed for wheezing or shortness of breath. Dispense Ventolin HFA please      carbidopa-levodopa 25-100 MG per tablet  Commonly known as:  SINEMET IR  Take 1 tablet by mouth 3 (three) times daily.     citalopram 10 MG tablet  Commonly known as:  CELEXA  take 1 tablet by mouth once daily     cycloSPORINE 0.05 % ophthalmic emulsion  Commonly known as:  RESTASIS  Place 1 drop into both eyes 2 (two) times daily.     Diclofenac Sodium 2 % Soln  Apply 1 pump twice daily.     fluticasone 50 MCG/ACT nasal spray  Commonly known as:  FLONASE  Place 2 sprays into both nostrils daily.     gabapentin 100 MG capsule  Commonly known as:  NEURONTIN  Take 1 capsule (100 mg total) by mouth 3 (three) times daily.     hydrocortisone 2.5 % ointment  Apply to affected area BID.     ibuprofen 800 MG tablet  Commonly known as:  ADVIL,MOTRIN  Take 800 mg by mouth 2 (two) times daily.     metoprolol tartrate 25 MG tablet  Commonly known as:  LOPRESSOR  take 1 tablet by mouth twice a day     tamsulosin 0.4 MG Caps capsule  Commonly known as:  FLOMAX  take 1 capsule by mouth once daily     TOVIAZ 4 MG Tb24 tablet  Generic drug:  fesoterodine  Take 4 mg  by mouth daily.         Brief H and P: For complete details please refer to admission H and P, but in brief Patient is a 74 year old male with chronic low back pain, follows Dr. Darrick Meigs resulting in multiple falls in 2014 and 2015, history of hepatitis C, anxiety, hypertension, hyperlipidemia presented to ED with altered mental status. Apparently patient was prescribed MSIR 15 mg every 4 hours as needed for pain on 09/01/58 while he was at the cancer center as he could not lay still for radiation therapy. Over the past one day or so patient complained of decreased appetite and per his wife was becoming more confused and less and able to doing anything. Per his wife, he went to radiation therapy on 6/14 and was confused after being given morphine. On 6/15 he was more confused but no focal neurological deficit. Patient was  brought to ED for further workup. Patient is also on multiple medications such as Celexa 10 mg, Valium 10 mg, lorazepam 2 mg, MSIR 15 mg as well as gabapentin 200 mg 3 times a day.  Hospital Course:  Toxic metabolic encephalopathy: Likely due to multiple medications including morphine and has underlying history of chronic kidney disease - Ammonia level slightly elevated at 61 on 6/15, given 1 dose of lactulose, improved to 29. CT head negative for any acute intracranial abnormalities. All sedating medications were discontinued. Patient is currently back to his baseline, ambulating with a walker.  Restarted the Celexa, gabapentin decreased to 100 mg TID, Valium, Ativan, MSIR were discontinued. Please evaluate patient at the time of follow-up appointment and readjust medications as indicated.   Abdominal bloating, has history of thrombocytopenia, hepatitis C- improved Right upper quadrant ultrasound did not show any ascites., Likely due to constipation from significant amount of morphine patient was taking outpatient.   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 [name] of nephrology - Continue gentle hydration, avoid nephrotoxic agents, improving -Baseline creatinine appears to be 2.3    Anxiety state  CKD (chronic kidney disease) stage 3, GFR 30-59 ml/min-followed by Dr. [name] of nephrology  Parkinson's disease - Continue Sinemet, follows with Dr. Carles Collet outpatient  Orthostatic hypotension: Could be due to autonomic dysfunction from Parkinson's disease PT evaluation has been pending however patient is now ambulating with a walker without any difficulty, appears to be at baseline. Home PT was arranged by case management prior to discharge. Strongly recommend to follow-up with neurology outpatient, Dr. Carles Collet.  Prostate cancer - Continue Flomax, and radiation therapy outpatient  Day  of Discharge BP 157/76 mmHg  Pulse 60  Temp(Src) 97.4 F (36.3 C) (Oral)  Resp 18  Ht 6' (1.829 m)  Wt 94.257 kg (207 lb 12.8 oz)  BMI 28.18 kg/m2  SpO2 97%  Physical Exam: General: Alert and awake oriented x3 not in any acute distress. HEENT: anicteric sclera, pupils reactive to light and accommodation CVS: S1-S2 clear no murmur rubs or gallops Chest: clear to auscultation bilaterally, no wheezing rales or rhonchi Abdomen: soft nontender, nondistended, normal bowel sounds Extremities: no cyanosis, clubbing or edema noted bilaterally Neuro: Cranial nerves II-XII intact, no focal neurological deficits   The results of significant diagnostics from this hospitalization (including imaging, microbiology, ancillary and laboratory) are listed below for reference.    LAB RESULTS: Basic Metabolic Panel:  Recent Labs Lab 09/08/14 1505 09/09/14 0550  NA 143 142  K 4.8 4.7  CL 116* 115*  CO2 19* 18*  GLUCOSE 125* 120*  BUN 82* 69*  CREATININE 2.43* 2.18*  CALCIUM 9.7 9.6   Liver Function Tests:  Recent Labs Lab 09/08/14 1505 09/09/14 0550  AST 65* 57*  ALT 18 7*  ALKPHOS 81 69  BILITOT 0.3 0.7  PROT 8.1 7.7  ALBUMIN 3.2* 3.0*   No results for input(s): LIPASE, AMYLASE in the last 168 hours.  Recent Labs Lab 09/08/14 1852 09/09/14 0932  AMMONIA 61* 29   CBC:  Recent Labs Lab 09/08/14 1505 09/09/14 0550  WBC 5.0 5.4  NEUTROABS 2.7  --   HGB 9.5* 9.1*  HCT 30.3* 29.3*  MCV 85.8 85.9  PLT 80* 73*   Cardiac Enzymes:  Recent Labs Lab 09/08/14 1505  TROPONINI 0.03   BNP: Invalid input(s): POCBNP CBG: No results for input(s): GLUCAP in the last 168 hours.  Significant Diagnostic Studies:  Dg Chest 2 View  09/08/2014   CLINICAL DATA:  Confusion, fall  EXAM: CHEST  2 VIEW  COMPARISON:  02/16/2014  FINDINGS: Cardiomediastinal silhouette is stable. No acute infiltrate or pleural effusion. No pulmonary edema. Stable postsurgical changes distal right  clavicle.  IMPRESSION: No active cardiopulmonary disease.   Electronically Signed   By: Lahoma Crocker M.D.   On: 09/08/2014 15:46   Ct Head Wo Contrast  09/08/2014   CLINICAL DATA:  74 year old male with intermittent stroke-like symptoms and recent fall  EXAM: CT HEAD WITHOUT CONTRAST  CT CERVICAL SPINE WITHOUT CONTRAST  TECHNIQUE: Multidetector CT imaging of the head and cervical spine was performed following the standard protocol without intravenous contrast. Multiplanar CT image reconstructions of the cervical spine were also generated.  COMPARISON:  Prior CT head and cervical spine 08/11/2013  FINDINGS: CT HEAD FINDINGS  Negative for acute intracranial hemorrhage, acute infarction, mass, mass effect, hydrocephalus or midline shift. Gray-white differentiation is preserved throughout. No focal soft tissue or calvarial abnormality. Globes and orbits are symmetric and intact bilaterally. Normal aeration of the mastoid air cells and paranasal sinuses.  CT CERVICAL SPINE FINDINGS  No acute fracture, malalignment or prevertebral soft tissue swelling. Degenerative change at the atlantodental interval with partial ossification of the cruciate ligaments. Multilevel degenerative disc disease with calcification of the anterior and posterior annulus fibrosis at multiple levels. Mild degenerative endplate spurring. Mild multilevel facet arthropathy. Unremarkable CT appearance of the thyroid gland. No acute soft tissue abnormality. 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 Atherosclerotic calcifications in both carotid bifurcations. Degenerative change at the right sternoclavicular junction similar compared to May of 2015.  IMPRESSION: CT HEAD  1. Negative CT CSPINE  1. No acute fracture or malalignment. 2. 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 3. 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   Electronically Signed   By: Jacqulynn Cadet M.D.   On: 09/08/2014 15:12    Ct Cervical Spine Wo Contrast  09/08/2014   CLINICAL DATA:  74 year old male with intermittent stroke-like symptoms and recent fall  EXAM: CT HEAD WITHOUT CONTRAST  CT CERVICAL SPINE WITHOUT CONTRAST  TECHNIQUE: Multidetector CT imaging of the head and cervical spine was performed following the standard protocol without intravenous contrast. Multiplanar CT image reconstructions of the cervical spine were also generated.  COMPARISON:  Prior CT head and cervical spine 08/11/2013  FINDINGS: CT HEAD FINDINGS  Negative for acute intracranial hemorrhage, acute infarction, mass, mass effect, hydrocephalus or midline shift. Gray-white differentiation is preserved throughout. No focal soft tissue or calvarial abnormality. Globes and orbits  are symmetric and intact bilaterally. Normal aeration of the mastoid air cells and paranasal sinuses.  CT CERVICAL SPINE FINDINGS  No acute fracture, malalignment or prevertebral soft tissue swelling. Degenerative change at the atlantodental interval with partial ossification of the cruciate ligaments. Multilevel degenerative disc disease with calcification of the anterior and posterior annulus fibrosis at multiple levels. Mild degenerative endplate spurring. Mild multilevel facet arthropathy. Unremarkable CT appearance of the thyroid gland. No acute soft tissue abnormality. 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 Atherosclerotic calcifications in both carotid bifurcations. Degenerative change at the right sternoclavicular junction similar compared to May of 2015.  IMPRESSION: CT HEAD  1. Negative CT CSPINE  1. No acute fracture or malalignment. 2. 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 3. 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   Electronically Signed   By: Jacqulynn Cadet M.D.   On: 09/08/2014 15:12   US Abdomen Limited  09/09/2014   CLINICAL DATA:  Abdominal distention  EXAM: LIMITED ABDOMINAL ULTRASOUND   COMPARISON:  None.  FINDINGS: There is peristalsing bowel throughout the abdomen and pelvis. No ascites is apparent.  IMPRESSION: No apparent ascites. Peristalsing bowel throughout the abdomen and pelvis.   Electronically Signed   By: Lowella Grip III M.D.   On: 09/09/2014 14:16    2D ECHO:   Disposition and Follow-up: Discharge Instructions    Diet - low sodium heart healthy    Complete by:  As directed      Increase activity slowly    Complete by:  As directed             DISPOSITION: home    DISCHARGE FOLLOW-UP Follow-up Information    Follow up with [name], MD.   Specialties:  Internal Medicine, Radiology   Why:  Appt. June 24th @ 3;15 pm   Contact information:   [place] [address] [phone]       Follow up with TAT, REBECCA, DO. Schedule an appointment as soon as possible for a visit in 2 weeks.   Specialty:  Neurology   Why:  for hospital follow-up   Contact information:   [place] [address] [phone]        Time spent on Discharge: 35 mins   Signed:   Kenrick Pore M.D. [facility] 09/09/2014, 4:38 PM Pager: [phone]

## 2014-09-10 ENCOUNTER — Ambulatory Visit: Admission: RE | Admit: 2014-09-10 | Payer: Medicare Other | Source: Ambulatory Visit | Admitting: Radiation Oncology

## 2014-09-10 ENCOUNTER — Ambulatory Visit: Payer: Medicare Other

## 2014-09-10 ENCOUNTER — Telehealth: Payer: Self-pay | Admitting: *Deleted

## 2014-09-10 ENCOUNTER — Telehealth: Payer: Self-pay | Admitting: Radiation Oncology

## 2014-09-10 NOTE — Care Management Note (Signed)
Case Management Note  Patient Details  Name: Gerald Gomez MRN: [id number] Date of Birth: [birth year]  Subjective/Objective:     Admitted with toxic encephalitis          Action/Plan: Late entry from 09/09/2014 - Talked to patient about DCP about [place] choices, patient chose [place]; Butch Penny with [facility] called for arrangements.  Expected Discharge Date:     09/09/2014             Expected Discharge Plan:  [place]  Discharge planning Services  CM Consult Choice offered to:   Patient :     HH Arranged:  PT [place]:  Rachel  Status of Service:  Completed   Sherrilyn Rist [phone] 09/10/2014, 8:20 AM

## 2014-09-10 NOTE — Telephone Encounter (Signed)
Transition Care Management Follow-up Telephone Call D/C 09/09/14   How have you been since you were released from the hospital? Spoke with pt wife she states he is doing ok   Do you understand why you were in the hospital? YES   Do you understand the discharge instrcutions? YES  Items Reviewed:  Medications reviewed: YES  Allergies reviewed: YES  Dietary changes reviewed: YES  Referrals reviewed: No referral needed   Functional Questionnaire:   Activities of Daily Living (ADLs):   She states he are independent in the following: ambulation, bathing and hygiene, feeding, continence, grooming, toileting and dressing States he require assistance with the following: ambulation   Any transportation issues/concerns?: NO   Any patient concerns? NO   Confirmed importance and date/time of follow-up visits scheduled: YES, appt made 09/17/14 w/Dr. Jenny Reichmann   Confirmed with patient if condition begins to worsen call PCP or go to the ER.

## 2014-09-10 NOTE — Telephone Encounter (Signed)
Phoned patient at Dr. [name] request. Spoke with patient's wife. She confirms her husband is home and plans to resume radiation treatment on Monday, June 20th. Requested they arrive at 1330 to be seen by Dr. [name] prior to treatment. Also, requested the patient not take the morphine before treatment. Explained that Dr. [name] plans to discuss pain management with him on Monday. Wife verbalized understanding. Informed Eve, RT on L1 of this as well.

## 2014-09-13 ENCOUNTER — Ambulatory Visit: Payer: Medicare Other

## 2014-09-13 ENCOUNTER — Encounter: Payer: Self-pay | Admitting: Radiation Oncology

## 2014-09-13 ENCOUNTER — Ambulatory Visit
Admission: RE | Admit: 2014-09-13 | Discharge: 2014-09-13 | Disposition: A | Discharge status: Home / Self Care | Payer: Medicare Other | Source: Ambulatory Visit | Attending: Radiation Oncology | Admitting: Radiation Oncology

## 2014-09-13 ENCOUNTER — Telehealth: Payer: Self-pay | Admitting: Radiation Oncology

## 2014-09-13 VITALS — BP 142/82 | HR 68 | Resp 16 | Wt 210.2 lb

## 2014-09-13 DIAGNOSIS — M159 Polyosteoarthritis, unspecified: Secondary | ICD-10-CM

## 2014-09-13 DIAGNOSIS — M5137 Other intervertebral disc degeneration, lumbosacral region: Secondary | ICD-10-CM

## 2014-09-13 DIAGNOSIS — C61 Malignant neoplasm of prostate: Secondary | ICD-10-CM

## 2014-09-13 DIAGNOSIS — M150 Primary generalized (osteo)arthritis: Secondary | ICD-10-CM

## 2014-09-13 MED ORDER — HYDROCODONE-ACETAMINOPHEN 5-325 MG PO TABS: 1.0000 | ORAL_TABLET | Freq: Four times a day (QID) | ORAL | Status: DC | PRN

## 2014-09-13 MED ORDER — MORPHINE SULFATE 4 MG/ML IJ SOLN
4.0000 mg | INTRAMUSCULAR | Status: AC
  Administered 2014-09-13: 4 mg via INTRAMUSCULAR
  Filled 2014-09-13: qty 1

## 2014-09-13 NOTE — Progress Notes (Signed)
  Radiation Oncology         [phone]   Name: Gerald Gomez MRN: [id number]   Date: 09/13/2014  DOB: 1940/08/21   Weekly Radiation Therapy Management    ICD-9-CM ICD-10-CM   1. Malignant neoplasm of prostate 185 C61 HYDROcodone-acetaminophen (NORCO/VICODIN) 5-325 MG per tablet     morphine 4 MG/ML injection 4 mg  2. Primary osteoarthritis involving multiple joints 715.09 M15.0 HYDROcodone-acetaminophen (NORCO/VICODIN) 5-325 MG per tablet  3. DISC DISEASE, LUMBAR 722.52 M51.37 morphine 4 MG/ML injection 4 mg    Current Dose: 9.75 Gy  Planned Dose:  78 Gy  Narrative The patient presents for routine under treatment assessment. Patient presents accompanied by his wife for evaluation prior to radiation s/p hospitalization. Patient hospitalized and treatment held s/p episode of slurred speech and fall. Reports the day of hospitalization he took Gabapentin 200 mg and motrin 400 mg in the morning, 200 mg of gabapentin and 400 mg of motrin at noon, and morphine at 13:30. Reports today he feels weak. Ambulating with aid of walker. Denies dysuria. Describes a strong urine stream. Denies difficulty emptying his bladder. Denies pain at this time. Reports he experiences low back pain and left leg pain when he is unable to move/change positions. Reports one episode of diarrhea.  At our request, he did not take his morphine pill today. Pain management took x rays of his leg. Wife mentions that her husband was taking hydrocodone until the beginning of the year, it alleviated his back pain. He was curious if he could have morphine shots with each treatment.  The patient is without complaint. Set-up films were reviewed. The chart was checked.  Physical Findings  weight is 210 lb 3.2 oz (95.346 kg). His blood pressure is 142/82 and his pulse is 68. His respiration is 16. . Weight essentially stable.  No significant changes.  Impression The patient is tolerating radiation.  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  Plan Continue treatment as planned. Prescribed hydrocodone to be taken prior to radiation treatment.     This document serves as a record of services personally performed by Tyler Pita, MD. It was created on his behalf by Arlyce Harman, a trained medical scribe. The creation of this record is based on the scribe's personal observations and the provider's statements to them. This document has been checked and approved by the attending provider.       Sheral Apley [name], M.D.

## 2014-09-13 NOTE — Progress Notes (Signed)
Patient presents accompanied by his wife for evaluation prior to radiation s/p hospitalization. Patient hospitalized and treatment held s/p episode of slurred speech and fall.Reports the day of hospitalization he too Gabapentin 200 mg and motrin 400 mg in the morning, 200 mg of gabapentin and 400 mg of motrin at noon, and morphine at 1330. Reports today he feels weak. Ambulating with aid of walker. Denies dysuria. Describes a strong urine stream. Denies difficulty emptying his bladder. Denies pain at this time. Reports he experiences low back pain and left leg pain when he is unable to move/change positions. Reports one episode of diarrhea. Reports he was see by Dr. Vernon Prey 35 years ago and surgery was recommended. Patient denies seeing an orthopedic since.

## 2014-09-13 NOTE — Telephone Encounter (Signed)
Originally requested patient present to the rad onc clinic at 1330 to be evaluated by Dr. [name] prior to his treatment time of 1400. Dr. [name] scheduled in the OR from 1300-1500. Thus, requested patient arrive at 1500 to be evaluated prior to 1530 treatment. Patient's wife confirmed this appointment time firm would work and they would be present.

## 2014-09-14 ENCOUNTER — Ambulatory Visit
Admission: RE | Admit: 2014-09-14 | Discharge: 2014-09-14 | Disposition: A | Discharge status: Home / Self Care | Payer: Medicare Other | Source: Ambulatory Visit | Attending: Radiation Oncology | Admitting: Radiation Oncology

## 2014-09-14 ENCOUNTER — Ambulatory Visit: Payer: Medicare Other

## 2014-09-14 DIAGNOSIS — C61 Malignant neoplasm of prostate: Secondary | ICD-10-CM | POA: Diagnosis not present

## 2014-09-15 ENCOUNTER — Ambulatory Visit: Payer: Medicare Other

## 2014-09-15 ENCOUNTER — Ambulatory Visit
Admission: RE | Admit: 2014-09-15 | Discharge: 2014-09-15 | Disposition: A | Discharge status: Home / Self Care | Payer: Medicare Other | Source: Ambulatory Visit | Attending: Radiation Oncology | Admitting: Radiation Oncology

## 2014-09-15 DIAGNOSIS — C61 Malignant neoplasm of prostate: Secondary | ICD-10-CM | POA: Diagnosis not present

## 2014-09-16 ENCOUNTER — Ambulatory Visit: Payer: Medicare Other

## 2014-09-16 ENCOUNTER — Ambulatory Visit
Admission: RE | Admit: 2014-09-16 | Discharge: 2014-09-16 | Disposition: A | Discharge status: Home / Self Care | Payer: Medicare Other | Source: Ambulatory Visit | Attending: Radiation Oncology | Admitting: Radiation Oncology

## 2014-09-16 DIAGNOSIS — C61 Malignant neoplasm of prostate: Secondary | ICD-10-CM | POA: Diagnosis not present

## 2014-09-17 ENCOUNTER — Encounter: Payer: Self-pay | Admitting: Radiation Oncology

## 2014-09-17 ENCOUNTER — Ambulatory Visit: Payer: Medicare Other

## 2014-09-17 ENCOUNTER — Ambulatory Visit
Admission: RE | Admit: 2014-09-17 | Discharge: 2014-09-17 | Disposition: A | Discharge status: Home / Self Care | Payer: Medicare Other | Source: Ambulatory Visit | Attending: Radiation Oncology | Admitting: Radiation Oncology

## 2014-09-17 ENCOUNTER — Inpatient Hospital Stay: Payer: Medicare Other | Admitting: Internal Medicine

## 2014-09-17 VITALS — BP 130/70 | HR 70 | Temp 98.1°F | Ht 72.0 in | Wt 213.2 lb

## 2014-09-17 DIAGNOSIS — C61 Malignant neoplasm of prostate: Secondary | ICD-10-CM | POA: Diagnosis not present

## 2014-09-17 NOTE — Progress Notes (Signed)
Department of Radiation Oncology  Phone:  [phone] Fax:        [phone]  Weekly Treatment Note    Name: Gerald Gomez Date: 09/17/2014 MRN: [id number] DOB: 03-May-1940   Current dose: 19.5 Gy  Current fraction: 10   MEDICATIONS: Current Outpatient Prescriptions  Medication Sig Dispense Refill  . albuterol (PROVENTIL HFA;VENTOLIN HFA) 108 (90 BASE) MCG/ACT inhaler Inhale 2 puffs into the lungs every 6 (six) hours as needed for wheezing or shortness of breath. Dispense Ventolin HFA please 1 Inhaler 11  . carbidopa-levodopa (SINEMET IR) 25-100 MG per tablet Take 1 tablet by mouth 3 (three) times daily. 90 tablet 5  . citalopram (CELEXA) 10 MG tablet take 1 tablet by mouth once daily 90 tablet 2  . cycloSPORINE (RESTASIS) 0.05 % ophthalmic emulsion Place 1 drop into both eyes 2 (two) times daily.    . fluticasone (FLONASE) 50 MCG/ACT nasal spray Place 2 sprays into both nostrils daily.    . furosemide (LASIX) 40 MG tablet   0  . gabapentin (NEURONTIN) 100 MG capsule Take 1 capsule (100 mg total) by mouth 3 (three) times daily. 90 capsule 0  . HYDROcodone-acetaminophen (NORCO/VICODIN) 5-325 MG per tablet Take 1-2 tablets by mouth every 6 (six) hours as needed for moderate pain (30 min before radiation). 45 tablet 0  . hydrocortisone 2.5 % ointment Apply to affected area BID. 30 g 3  . ibuprofen (ADVIL,MOTRIN) 800 MG tablet Take 800 mg by mouth 2 (two) times daily.    . metoprolol tartrate (LOPRESSOR) 25 MG tablet take 1 tablet by mouth twice a day 180 tablet 3  . morphine (MSIR) 15 MG tablet TAKE 1 TABLET BY MOUTH EVERY 4 HOURS AS NEEDED FOR PAIN AND 30 MINUTES BEFORE RADIATION  0  . potassium chloride (K-DUR,KLOR-CON) 10 MEQ tablet   1  . tamsulosin (FLOMAX) 0.4 MG CAPS capsule take 1 capsule by mouth once daily 90 capsule 3  . TOVIAZ 4 MG TB24 tablet Take 4 mg by mouth daily.  0  . Diclofenac Sodium 2 % SOLN Apply 1 pump twice daily. 112 g 3   No current  facility-administered medications for this encounter.     ALLERGIES: Tramadol   LABORATORY DATA:  Lab Results  Component Value Date   WBC 5.4 09/09/2014   HGB 9.1* 09/09/2014   HCT 29.3* 09/09/2014   MCV 85.9 09/09/2014   PLT 73* 09/09/2014   Lab Results  Component Value Date   NA 142 09/09/2014   K 4.7 09/09/2014   CL 115* 09/09/2014   CO2 18* 09/09/2014   Lab Results  Component Value Date   ALT 7* 09/09/2014   AST 57* 09/09/2014   ALKPHOS 69 09/09/2014   BILITOT 0.7 09/09/2014     NARRATIVE: Gerald Gomez was seen today for weekly treatment management. The chart was checked and the patient's films were reviewed.  Gerald Gomez has received 10 fractions to his pelvis for prostate cancer.  He denies any apin upon urination, but reports occassional tingling at end of urinary stream.  Denies any frequency urgency, nor straining to void.  Nocturia ~ 1-2 times.  Denies rectal irritation and no diarrhea presently.  Insomnia off and on.   Admits to "a little bit" of fatigue.  PHYSICAL EXAMINATION: height is 6' (1.829 m) and weight is 213 lb 3.2 oz (96.707 kg). His temperature is 98.1 F (36.7 C). His blood pressure is 130/70 and his pulse is 70.  ASSESSMENT: The patient is doing satisfactorily with treatment.  PLAN: We will continue with the patient's radiation treatment as planned.

## 2014-09-17 NOTE — Progress Notes (Signed)
Gerald Gomez has received 10 fractions to his pelvis for prostate cancer.  He denies any apin upon urination, but reports occassional tingling at end of urinary stream.  Denies any frequency urgency, nor straining to void.  Nocturia ~ 1-2 times.  Denies rectal irritation and no diarrhea presently.  Insomnia off and on.   Admits to "a little bit" of fatigue.

## 2014-09-20 ENCOUNTER — Ambulatory Visit: Payer: Medicare Other

## 2014-09-20 ENCOUNTER — Ambulatory Visit
Admission: RE | Admit: 2014-09-20 | Discharge: 2014-09-20 | Disposition: A | Discharge status: Home / Self Care | Payer: Medicare Other | Source: Ambulatory Visit | Attending: Radiation Oncology | Admitting: Radiation Oncology

## 2014-09-20 ENCOUNTER — Other Ambulatory Visit: Payer: Self-pay | Admitting: Urology

## 2014-09-20 DIAGNOSIS — C61 Malignant neoplasm of prostate: Secondary | ICD-10-CM | POA: Diagnosis not present

## 2014-09-21 ENCOUNTER — Ambulatory Visit: Payer: Medicare Other

## 2014-09-21 ENCOUNTER — Ambulatory Visit
Admission: RE | Admit: 2014-09-21 | Discharge: 2014-09-21 | Disposition: A | Discharge status: Home / Self Care | Payer: Medicare Other | Source: Ambulatory Visit | Attending: Radiation Oncology | Admitting: Radiation Oncology

## 2014-09-21 DIAGNOSIS — C61 Malignant neoplasm of prostate: Secondary | ICD-10-CM | POA: Diagnosis not present

## 2014-09-22 ENCOUNTER — Ambulatory Visit
Admission: RE | Admit: 2014-09-22 | Discharge: 2014-09-22 | Disposition: A | Discharge status: Home / Self Care | Payer: Medicare Other | Source: Ambulatory Visit | Attending: Radiation Oncology | Admitting: Radiation Oncology

## 2014-09-22 ENCOUNTER — Ambulatory Visit: Payer: Medicare Other

## 2014-09-22 ENCOUNTER — Encounter: Payer: Self-pay | Admitting: Radiation Oncology

## 2014-09-22 VITALS — BP 139/73 | HR 74 | Resp 16 | Wt 211.4 lb

## 2014-09-22 DIAGNOSIS — C61 Malignant neoplasm of prostate: Secondary | ICD-10-CM

## 2014-09-22 NOTE — Progress Notes (Signed)
Weight and vitals stable. Reports back pain is tolerable. Reports taking morphine and motrin thirty minutes prior to radiation therapy today. Patient reports his cousin who is a pharmacist told him he could use both the vicodin and morphine interchangeably.  Reports nocturia x1. Reports he continues Flomax. Denies dysuria or hematuria. Denies diarrhea. Reports a steady urine stream. Denies incontinence.   BP 139/73 mmHg  Pulse 74  Resp 16  Wt 211 lb 6.4 oz (95.89 kg) Wt Readings from Last 3 Encounters:  09/22/14 211 lb 6.4 oz (95.89 kg)  09/17/14 213 lb 3.2 oz (96.707 kg)  09/13/14 210 lb 3.2 oz (95.346 kg)

## 2014-09-22 NOTE — Progress Notes (Signed)
  Radiation Oncology         [phone]   Name: Gerald Gomez MRN: [id number]   Date: 09/22/2014  DOB: March 26, 1941   Weekly Radiation Therapy Management  Dignosis: Gerald Gomez is a 74 year old gentleman presenting to clinic in regards to his alignant neoplasm of the prostate.  Current Dose: 25.35 Gy  Planned Dose:  78 Gy  Narrative The patient presents for routine under treatment assessment. Weight and vitals stable. Reports back pain is tolerable. Reports taking morphine and motrin thirty minutes prior to radiation therapy today. Patient reports his cousin who is a pharmacist told him he could use both the vicodin and morphine interchangeably.  Reports nocturia x1. Reports he continues Flomax. Troop denies dysuria or hematuria, diarrhea, or incontinence.  Physical Findings  weight is 211 lb 6.4 oz (95.89 kg). His blood pressure is 139/73 and his pulse is 74. His respiration is 16. . Weight essentially stable.  No significant changes.  Impression Gerald Gomez is a 74 year old gentleman presenting to clinic in regards to his alignant neoplasm of the prostate. The patient is tolerating radiation.   Plan We discussed symptoms that may occur at this stage in the patient's treatment with the patient and his spouse. The patient was content with lack of unusual symptoms thus far.  Continue as planned      This document serves as a record of services personally performed by Tyler Pita, MD. It was created on his behalf by Lenn Cal, a trained medical scribe. The creation of this record is based on the scribe's personal observations and the provider's statements to them. This document has been checked and approved by the attending provider.       Sheral Apley [name], M.D.

## 2014-09-23 ENCOUNTER — Ambulatory Visit: Payer: Medicare Other

## 2014-09-23 ENCOUNTER — Ambulatory Visit
Admission: RE | Admit: 2014-09-23 | Discharge: 2014-09-23 | Disposition: A | Discharge status: Home / Self Care | Payer: Medicare Other | Source: Ambulatory Visit | Attending: Radiation Oncology | Admitting: Radiation Oncology

## 2014-09-23 DIAGNOSIS — C61 Malignant neoplasm of prostate: Secondary | ICD-10-CM | POA: Diagnosis not present

## 2014-09-24 ENCOUNTER — Ambulatory Visit
Admission: RE | Admit: 2014-09-24 | Discharge: 2014-09-24 | Disposition: A | Discharge status: Home / Self Care | Payer: Medicare Other | Source: Ambulatory Visit | Attending: Radiation Oncology | Admitting: Radiation Oncology

## 2014-09-24 ENCOUNTER — Ambulatory Visit: Payer: Medicare Other

## 2014-09-24 DIAGNOSIS — C61 Malignant neoplasm of prostate: Secondary | ICD-10-CM | POA: Diagnosis not present

## 2014-09-28 ENCOUNTER — Ambulatory Visit
Admission: RE | Admit: 2014-09-28 | Discharge: 2014-09-28 | Disposition: A | Discharge status: Home / Self Care | Payer: Medicare Other | Source: Ambulatory Visit | Attending: Radiation Oncology | Admitting: Radiation Oncology

## 2014-09-28 ENCOUNTER — Ambulatory Visit: Payer: Medicare Other

## 2014-09-28 DIAGNOSIS — C61 Malignant neoplasm of prostate: Secondary | ICD-10-CM | POA: Diagnosis not present

## 2014-09-29 ENCOUNTER — Ambulatory Visit
Admission: RE | Admit: 2014-09-29 | Discharge: 2014-09-29 | Disposition: A | Discharge status: Home / Self Care | Payer: Medicare Other | Source: Ambulatory Visit | Attending: Radiation Oncology | Admitting: Radiation Oncology

## 2014-09-29 ENCOUNTER — Ambulatory Visit: Payer: Medicare Other

## 2014-09-29 DIAGNOSIS — C61 Malignant neoplasm of prostate: Secondary | ICD-10-CM | POA: Diagnosis not present

## 2014-09-30 ENCOUNTER — Ambulatory Visit
Admission: RE | Admit: 2014-09-30 | Discharge: 2014-09-30 | Disposition: A | Discharge status: Home / Self Care | Payer: Medicare Other | Source: Ambulatory Visit | Attending: Radiation Oncology | Admitting: Radiation Oncology

## 2014-09-30 ENCOUNTER — Ambulatory Visit: Payer: Medicare Other

## 2014-09-30 DIAGNOSIS — C61 Malignant neoplasm of prostate: Secondary | ICD-10-CM | POA: Diagnosis not present

## 2014-10-01 ENCOUNTER — Ambulatory Visit: Payer: Medicare Other

## 2014-10-01 ENCOUNTER — Ambulatory Visit
Admission: RE | Admit: 2014-10-01 | Discharge: 2014-10-01 | Disposition: A | Discharge status: Home / Self Care | Payer: Medicare Other | Source: Ambulatory Visit | Attending: Radiation Oncology | Admitting: Radiation Oncology

## 2014-10-01 ENCOUNTER — Encounter: Payer: Self-pay | Admitting: Radiation Oncology

## 2014-10-01 VITALS — BP 129/60 | HR 73 | Resp 16 | Wt 213.3 lb

## 2014-10-01 DIAGNOSIS — C61 Malignant neoplasm of prostate: Secondary | ICD-10-CM | POA: Diagnosis not present

## 2014-10-01 NOTE — Progress Notes (Signed)
Weight and vitals stable. Reports back pain experience on treatment table is tolerable with premeds. Reports nocturia x 2. Reports he continues to take Flomax as directed. Denies dysuria or hematuria. Reports loose stools. Describes a strong steady urine stream. Denies incontinence.  BP 129/60 mmHg  Pulse 73  Resp 16  Wt 213 lb 4.8 oz (96.752 kg) Wt Readings from Last 3 Encounters:  10/01/14 213 lb 4.8 oz (96.752 kg)  09/22/14 211 lb 6.4 oz (95.89 kg)  09/17/14 213 lb 3.2 oz (96.707 kg)

## 2014-10-01 NOTE — Progress Notes (Signed)
  Radiation Oncology         [phone]   Name: Gerald Gomez MRN: [id number]   Date: 10/01/2014  DOB: [birth year]   Weekly Radiation Therapy Management  Dignosis: Sailor Haughn is a 74 year old gentleman presenting to clinic in regards to his alignant neoplasm of the prostate.  Current Dose: 37.05 Gy  Planned Dose:  78 Gy  Narrative The patient presents for routine under treatment assessment. Weight and vitals stable. Reports back pain experience on treatment table is tolerable with premeds. Reports nocturia x 2. Reports he continues to take Flomax as directed. Denies dysuria or hematuria. Reports loose stools. Describes a strong steady urine stream. Denies incontinence. The pt has no other complaints at this time. Set-up films were reviewed. The chart was checked.  Physical Findings  weight is 213 lb 4.8 oz (96.752 kg). His blood pressure is 129/60 and his pulse is 73. His respiration is 16. . Weight essentially stable.  No significant changes.  Impression Haydan Mansouri is a 74 year old gentleman presenting to clinic in regards to his alignant neoplasm of the prostate. The patient is tolerating radiation.  Plan Continue treatment as planned    This document serves as a record of services personally performed by Tyler Pita, MD. It was created on his behalf by Darcus Austin, a trained medical scribe. The creation of this record is based on the scribe's personal observations and the provider's statements to them. This document has been checked and approved by the attending provider.       Sheral Apley [name], M.D.

## 2014-10-04 ENCOUNTER — Ambulatory Visit: Payer: Medicare Other

## 2014-10-04 ENCOUNTER — Ambulatory Visit
Admission: RE | Admit: 2014-10-04 | Discharge: 2014-10-04 | Disposition: A | Discharge status: Home / Self Care | Payer: Medicare Other | Source: Ambulatory Visit | Attending: Radiation Oncology | Admitting: Radiation Oncology

## 2014-10-04 DIAGNOSIS — C61 Malignant neoplasm of prostate: Secondary | ICD-10-CM | POA: Diagnosis not present

## 2014-10-05 ENCOUNTER — Ambulatory Visit: Payer: Medicare Other

## 2014-10-05 ENCOUNTER — Telehealth: Payer: Self-pay | Admitting: Radiation Oncology

## 2014-10-05 NOTE — Telephone Encounter (Signed)
Received call from patient's wife, [name], that he wishes to cancel treatment for today. She reports he isn't feeling up to it today. She denies that he has needs at this time. Reported these findings to Dr. [name] and [name], RT on L1.

## 2014-10-06 ENCOUNTER — Telehealth: Payer: Self-pay | Admitting: Radiation Oncology

## 2014-10-06 ENCOUNTER — Ambulatory Visit: Payer: Medicare Other

## 2014-10-06 NOTE — Telephone Encounter (Signed)
Received call from patient's wife that he didn't feel up to coming in today for radiation treatment. Reports he is experiencing weakness and fatigue. Denies fever. Reports he plans to try and make it in tomorrow. Informed Emily, RT on L1 of this finding.

## 2014-10-07 ENCOUNTER — Ambulatory Visit: Payer: Medicare Other

## 2014-10-07 ENCOUNTER — Ambulatory Visit
Admission: RE | Admit: 2014-10-07 | Discharge: 2014-10-07 | Disposition: A | Discharge status: Home / Self Care | Payer: Medicare Other | Source: Ambulatory Visit | Attending: Radiation Oncology | Admitting: Radiation Oncology

## 2014-10-07 DIAGNOSIS — C61 Malignant neoplasm of prostate: Secondary | ICD-10-CM | POA: Diagnosis not present

## 2014-10-08 ENCOUNTER — Ambulatory Visit: Payer: Medicare Other

## 2014-10-08 ENCOUNTER — Ambulatory Visit
Admission: RE | Admit: 2014-10-08 | Discharge: 2014-10-08 | Disposition: A | Discharge status: Home / Self Care | Payer: Medicare Other | Source: Ambulatory Visit | Attending: Radiation Oncology | Admitting: Radiation Oncology

## 2014-10-08 ENCOUNTER — Encounter: Payer: Self-pay | Admitting: Radiation Oncology

## 2014-10-08 VITALS — BP 146/79 | HR 56 | Resp 16 | Wt 212.6 lb

## 2014-10-08 DIAGNOSIS — C61 Malignant neoplasm of prostate: Secondary | ICD-10-CM | POA: Diagnosis not present

## 2014-10-08 NOTE — Progress Notes (Signed)
Weight and vital stable. Reports back pain experience on treatment table is tolerable with premeds. Reports nausea, diarrhea and he feels sick on his stomach. Reports he is feeling better today than yesterday and the day before. Received three of five treatments this week due to not feeling well. Reports nocturia x 2. Reports mild dysuria at the start of each void. Reports he continues flomax as directed. Reports one episode of urinary incontinence.  BP 146/79 mmHg  Pulse 56  Resp 16  Wt 212 lb 9.6 oz (96.435 kg) Wt Readings from Last 3 Encounters:  10/08/14 212 lb 9.6 oz (96.435 kg)  10/01/14 213 lb 4.8 oz (96.752 kg)  09/22/14 211 lb 6.4 oz (95.89 kg)

## 2014-10-08 NOTE — Progress Notes (Signed)
  Radiation Oncology         [phone]   Name: JOE GEE MRN: [id number]   Date: 10/08/2014  DOB: 08/17/1940   Weekly Radiation Therapy Management  Dignosis:    ICD-9-CM ICD-10-CM   1. Malignant neoplasm of prostate 185 C61               Current Dose: 42.4 Gy  Planned Dose:  78 Gy  Narrative The patient presents for routine under treatment assessment. Reports back pain experience on treatment table is tolerable with premeds. Reports nausea, diarrhea and he feels sick on his stomach. Reports he is feeling better today than yesterday and the day before. Received three of five treatments this week due to not feeling well. Reports nocturia x 2. Reports mild dysuria at the start of each void. Reports he continues flomax as directed. Reports one episode of urinary incontinence Set-up films were reviewed. The chart was checked.  Physical Findings  weight is 212 lb 9.6 oz (96.435 kg). His blood pressure is 146/79 and his pulse is 56. His respiration is 16. . Weight essentially stable.  No significant changes.  Impression Ulysees Robarts is a 74 year old gentleman presenting to clinic in regards to his alignant neoplasm of the prostate. The patient is tolerating radiation.  Plan Continue treatment as planned       Rodman Key A. [name], M.D.

## 2014-10-11 ENCOUNTER — Ambulatory Visit
Admission: RE | Admit: 2014-10-11 | Discharge: 2014-10-11 | Disposition: A | Discharge status: Home / Self Care | Payer: Medicare Other | Source: Ambulatory Visit | Attending: Radiation Oncology | Admitting: Radiation Oncology

## 2014-10-11 ENCOUNTER — Ambulatory Visit: Payer: Medicare Other

## 2014-10-11 DIAGNOSIS — C61 Malignant neoplasm of prostate: Secondary | ICD-10-CM | POA: Diagnosis not present

## 2014-10-12 ENCOUNTER — Ambulatory Visit: Payer: Medicare Other

## 2014-10-12 ENCOUNTER — Ambulatory Visit
Admission: RE | Admit: 2014-10-12 | Discharge: 2014-10-12 | Disposition: A | Discharge status: Home / Self Care | Payer: Medicare Other | Source: Ambulatory Visit | Attending: Radiation Oncology | Admitting: Radiation Oncology

## 2014-10-12 DIAGNOSIS — C61 Malignant neoplasm of prostate: Secondary | ICD-10-CM | POA: Diagnosis not present

## 2014-10-13 ENCOUNTER — Ambulatory Visit: Payer: Medicare Other

## 2014-10-13 ENCOUNTER — Ambulatory Visit
Admission: RE | Admit: 2014-10-13 | Discharge: 2014-10-13 | Disposition: A | Discharge status: Home / Self Care | Payer: Medicare Other | Source: Ambulatory Visit | Attending: Radiation Oncology | Admitting: Radiation Oncology

## 2014-10-13 DIAGNOSIS — Z510 Encounter for antineoplastic radiation therapy: Secondary | ICD-10-CM | POA: Insufficient documentation

## 2014-10-13 DIAGNOSIS — C61 Malignant neoplasm of prostate: Secondary | ICD-10-CM | POA: Insufficient documentation

## 2014-10-14 ENCOUNTER — Ambulatory Visit
Admission: RE | Admit: 2014-10-14 | Discharge: 2014-10-14 | Disposition: A | Discharge status: Home / Self Care | Payer: Medicare Other | Source: Ambulatory Visit | Attending: Radiation Oncology | Admitting: Radiation Oncology

## 2014-10-14 ENCOUNTER — Ambulatory Visit: Payer: Medicare Other

## 2014-10-14 DIAGNOSIS — C61 Malignant neoplasm of prostate: Secondary | ICD-10-CM | POA: Diagnosis present

## 2014-10-15 ENCOUNTER — Ambulatory Visit: Payer: Medicare Other

## 2014-10-15 ENCOUNTER — Ambulatory Visit
Admission: RE | Admit: 2014-10-15 | Discharge: 2014-10-15 | Disposition: A | Discharge status: Home / Self Care | Payer: Medicare Other | Source: Ambulatory Visit | Attending: Radiation Oncology | Admitting: Radiation Oncology

## 2014-10-15 ENCOUNTER — Encounter: Payer: Self-pay | Admitting: Radiation Oncology

## 2014-10-15 VITALS — BP 127/49 | HR 70 | Resp 16 | Wt 210.5 lb

## 2014-10-15 DIAGNOSIS — C61 Malignant neoplasm of prostate: Secondary | ICD-10-CM

## 2014-10-15 DIAGNOSIS — Z510 Encounter for antineoplastic radiation therapy: Secondary | ICD-10-CM | POA: Diagnosis not present

## 2014-10-15 NOTE — Progress Notes (Signed)
  Radiation Oncology         [phone]   Name: Gerald Gomez MRN: [id number]   Date: 10/15/2014  DOB: [birth year]   Weekly Radiation Therapy Management  Dignosis:    ICD-9-CM ICD-10-CM   1. Malignant neoplasm of prostate 185 C61               Current Dose: 52.65 Gy  Planned Dose:  78 Gy  Narrative The patient presents for routine under treatment assessment.  Weight and vital stable. Reports back pain experience on treatment table is tolerable with premeds. Reports right leg pain today related to effects of arthritis. Reports he often feels nauseated and he feels sick on his stomach. Reports diarrhea.g well. Reports nocturia x 2. Reports mild dysuria at the start of each void. Reports he continues flomax as directed. His wife reports that Gerald Gomez eats smaller portions and feels weak in his legs. Set-up films were reviewed. The chart was checked.  Physical Findings  weight is 210 lb 8 oz (95.482 kg). His blood pressure is 127/49 and his pulse is 70. His respiration is 16. . Weight essentially stable.  No significant changes.  Impression Gerald Gomez is a 74 year old gentleman presenting to clinic in regards to his alignant neoplasm of the prostate. The patient is tolerating radiation.  Plan Continue treatment as planned    This document serves as a record of services personally performed by Tyler Pita, MD. It was created on his behalf by Arlyce Harman, a trained medical scribe. The creation of this record is based on the scribe's personal observations and the provider's statements to them. This document has been checked and approved by the attending provider.      Sheral Apley [name], M.D.

## 2014-10-15 NOTE — Progress Notes (Signed)
Weight and vital stable. Reports back pain experience on treatment table is tolerable with premeds. Reports right leg pain today related to effects of arthritis. Reports he often feels nauseated and he feels sick on his stomach. Reports diarrhea.g well. Reports nocturia x 2. Reports mild dysuria at the start of each void. Reports he continues flomax as directed.   BP 127/49 mmHg  Pulse 70  Resp 16  Wt 210 lb 8 oz (95.482 kg) Wt Readings from Last 3 Encounters:  10/15/14 210 lb 8 oz (95.482 kg)  10/08/14 212 lb 9.6 oz (96.435 kg)  10/01/14 213 lb 4.8 oz (96.752 kg)

## 2014-10-18 ENCOUNTER — Ambulatory Visit
Admission: RE | Admit: 2014-10-18 | Discharge: 2014-10-18 | Disposition: A | Discharge status: Home / Self Care | Payer: Medicare Other | Source: Ambulatory Visit | Attending: Radiation Oncology | Admitting: Radiation Oncology

## 2014-10-18 ENCOUNTER — Ambulatory Visit: Payer: Medicare Other

## 2014-10-18 DIAGNOSIS — Z510 Encounter for antineoplastic radiation therapy: Secondary | ICD-10-CM | POA: Diagnosis not present

## 2014-10-19 ENCOUNTER — Ambulatory Visit: Payer: Medicare Other

## 2014-10-19 ENCOUNTER — Ambulatory Visit
Admission: RE | Admit: 2014-10-19 | Discharge: 2014-10-19 | Disposition: A | Discharge status: Home / Self Care | Payer: Medicare Other | Source: Ambulatory Visit | Attending: Radiation Oncology | Admitting: Radiation Oncology

## 2014-10-19 DIAGNOSIS — Z510 Encounter for antineoplastic radiation therapy: Secondary | ICD-10-CM | POA: Diagnosis not present

## 2014-10-20 ENCOUNTER — Ambulatory Visit
Admission: RE | Admit: 2014-10-20 | Discharge: 2014-10-20 | Disposition: A | Discharge status: Home / Self Care | Payer: Medicare Other | Source: Ambulatory Visit | Attending: Radiation Oncology | Admitting: Radiation Oncology

## 2014-10-20 DIAGNOSIS — Z510 Encounter for antineoplastic radiation therapy: Secondary | ICD-10-CM | POA: Diagnosis not present

## 2014-10-21 ENCOUNTER — Ambulatory Visit
Admission: RE | Admit: 2014-10-21 | Discharge: 2014-10-21 | Disposition: A | Discharge status: Home / Self Care | Payer: Medicare Other | Source: Ambulatory Visit | Attending: Radiation Oncology | Admitting: Radiation Oncology

## 2014-10-21 ENCOUNTER — Encounter: Payer: Self-pay | Admitting: Radiation Oncology

## 2014-10-21 VITALS — BP 125/73 | HR 73 | Temp 98.3°F | Resp 16 | Ht 72.0 in | Wt 212.5 lb

## 2014-10-21 DIAGNOSIS — Z510 Encounter for antineoplastic radiation therapy: Secondary | ICD-10-CM | POA: Diagnosis not present

## 2014-10-21 DIAGNOSIS — C61 Malignant neoplasm of prostate: Secondary | ICD-10-CM

## 2014-10-21 NOTE — Progress Notes (Signed)
Gerald Gomez has completed 31 fractions to his prostate.  He reports having chronic arthritis pain in his right leg.  He is using a walker today.  He denies an increase in urinary frequency during the day.  He reports getting up 2 times per night to urinate.  He reports his urinary stream is diminished occasionally.  He denies dysuria, hematuria and diarrhea.  He reports occasional fatigue.  BP 125/73 mmHg  Pulse 73  Temp(Src) 98.3 F (36.8 C) (Oral)  Resp 16  Ht 6' (1.829 m)  Wt 212 lb 8 oz (96.389 kg)  BMI 28.81 kg/m2  SpO2 98%

## 2014-10-21 NOTE — Progress Notes (Signed)
  Radiation Oncology         [phone]   Name: CHRISTOPHOR EICK MRN: [id number]   Date: 10/21/2014  DOB: January 22, 1941   Weekly Radiation Therapy Management  Dignosis: Gerald Gomez is a 74 year old gentleman presenting to clinic in regards to his C61 malignant neoplasm of the prostate.            Current Dose: 60.45 Gy  Planned Dose:  78 Gy  Narrative The patient presents for routine under treatment assessment and has completed 31 fractions to his prostate with 9 treatments remaining. He denies an increase in urinary frequency during the day and reports getting up 2 times per night to urinate.  His urinary stream is diminished occasionally.  He denies dysuria, hematuria and diarrhea. The patient claims occasional fatigue. The patient states having chronic arthritis pain in his right leg and is currently using a walker. Set-up films were reviewed and the chart was checked. He was accompanied by his wife for today's radiation oncology appointment.  Physical Findings  height is 6' (1.829 m) and weight is 212 lb 8 oz (96.389 kg). His oral temperature is 98.3 F (36.8 C). His blood pressure is 125/73 and his pulse is 73. His respiration is 16 and oxygen saturation is 98%.  The patient's weight is essentially stable with no significant changes to be noted at this time.  Impression Reynold Mantell is a 74 year old gentleman presenting to clinic in regards to his malignant neoplasm of the prostate. The patient is tolerating radiation well.  Plan All questions vocalized via the patient were fully addressed. Common symptoms to expect at this time in the patient's recovery were discussed and reviewed. Healthy methods to manage these symptoms if they are to occur were addressed. The patient has been advised to continue treatment as planned and made aware of his follow up appointment to take place with radiation oncology. If he or his wife develop further questions or concerns in regards to his treatment and recovery,  he has been encouraged to contact me   This document serves as a record of services personally performed by Tyler Pita, MD. It was created on his behalf by Lenn Cal, a trained medical scribe. The creation of this record is based on the scribe's personal observations and the provider's statements to them. This document has been checked and approved by the attending provider.  _________________________________________   Sheral Apley. [name], M.D.

## 2014-10-22 ENCOUNTER — Ambulatory Visit
Admission: RE | Admit: 2014-10-22 | Discharge: 2014-10-22 | Disposition: A | Discharge status: Home / Self Care | Payer: Medicare Other | Source: Ambulatory Visit | Attending: Radiation Oncology | Admitting: Radiation Oncology

## 2014-10-22 DIAGNOSIS — Z510 Encounter for antineoplastic radiation therapy: Secondary | ICD-10-CM | POA: Diagnosis not present

## 2014-10-25 ENCOUNTER — Ambulatory Visit
Admission: RE | Admit: 2014-10-25 | Discharge: 2014-10-25 | Disposition: A | Discharge status: Home / Self Care | Payer: Medicare Other | Source: Ambulatory Visit | Attending: Radiation Oncology | Admitting: Radiation Oncology

## 2014-10-25 DIAGNOSIS — Z510 Encounter for antineoplastic radiation therapy: Secondary | ICD-10-CM | POA: Diagnosis not present

## 2014-10-26 ENCOUNTER — Ambulatory Visit
Admission: RE | Admit: 2014-10-26 | Discharge: 2014-10-26 | Disposition: A | Discharge status: Home / Self Care | Payer: Medicare Other | Source: Ambulatory Visit | Attending: Radiation Oncology | Admitting: Radiation Oncology

## 2014-10-26 ENCOUNTER — Ambulatory Visit: Payer: Medicare Other

## 2014-10-26 ENCOUNTER — Telehealth: Payer: Self-pay | Admitting: Radiation Oncology

## 2014-10-26 NOTE — Telephone Encounter (Signed)
Returned message left by patient's wife, [name]. She reports her husband isn't feeling well today and will not be coming for treatment. She reports he is complaining that his legs are aching due to the weather. Informed Emily, RT for L1 of this finding.

## 2014-10-27 ENCOUNTER — Ambulatory Visit: Payer: Medicare Other

## 2014-10-28 ENCOUNTER — Ambulatory Visit: Payer: Medicare Other

## 2014-10-28 ENCOUNTER — Telehealth: Payer: Self-pay | Admitting: Radiation Oncology

## 2014-10-28 NOTE — Telephone Encounter (Signed)
Returned message left by patient's wife, [place]. She desire to cancel radiation treatment for today and tomorrow on her husband's behalf. She reports her husband plans to return for radiation treatment on Monday, August 8th. However, she is requesting a meeting with Dr. [name] to discuss stopping treatment. Patient requesting to see Dr. [name] on Monday. Will watch for a cancellation on Monday and phone wife back tomorrow. She verbalized understanding.

## 2014-10-29 ENCOUNTER — Ambulatory Visit
Admission: RE | Admit: 2014-10-29 | Discharge: 2014-10-29 | Disposition: A | Discharge status: Home / Self Care | Payer: Medicare Other | Source: Ambulatory Visit | Attending: Radiation Oncology | Admitting: Radiation Oncology

## 2014-10-29 ENCOUNTER — Ambulatory Visit: Payer: Medicare Other

## 2014-10-30 ENCOUNTER — Ambulatory Visit: Payer: Medicare Other

## 2014-11-01 ENCOUNTER — Ambulatory Visit: Payer: Medicare Other

## 2014-11-01 ENCOUNTER — Ambulatory Visit: Payer: Medicare Other | Admitting: Neurology

## 2014-11-01 ENCOUNTER — Ambulatory Visit
Admission: RE | Admit: 2014-11-01 | Discharge: 2014-11-01 | Disposition: A | Discharge status: Home / Self Care | Payer: Medicare Other | Source: Ambulatory Visit | Attending: Radiation Oncology | Admitting: Radiation Oncology

## 2014-11-01 ENCOUNTER — Encounter: Payer: Self-pay | Admitting: Radiation Oncology

## 2014-11-01 VITALS — BP 133/64 | HR 79 | Resp 20 | Wt 210.4 lb

## 2014-11-01 DIAGNOSIS — C61 Malignant neoplasm of prostate: Secondary | ICD-10-CM

## 2014-11-01 MED ORDER — PROCHLORPERAZINE MALEATE 10 MG PO TABS: 10.0000 mg | ORAL_TABLET | Freq: Four times a day (QID) | ORAL | Status: DC | PRN

## 2014-11-01 MED ORDER — LORAZEPAM 1 MG PO TABS: 1.0000 mg | ORAL_TABLET | Freq: Three times a day (TID) | ORAL | Status: DC | PRN

## 2014-11-01 NOTE — Progress Notes (Addendum)
Patient presents to the clinic today to discuss with Dr. [name] if continuing treatment is recommended. Patient received a treatment on Monday of last week but, missed the remaining four. Reports nausea and vomiting. Reports he was taking OTC dramamine to relieve nausea and vomiting but, this no longer helps. Reports diarrhea. Reports increased pain in low back that radiates down legs. Reports "nerves in his back" are so bad and made worse when assuming treatment position. Reports he has been unable to sleep since Wednesday.    BP 133/64 mmHg  Pulse 79  Resp 20  Wt 210 lb 6.4 oz (95.437 kg) Wt Readings from Last 3 Encounters:  11/01/14 210 lb 6.4 oz (95.437 kg)  10/21/14 212 lb 8 oz (96.389 kg)  10/15/14 210 lb 8 oz (95.482 kg)

## 2014-11-01 NOTE — Progress Notes (Signed)
  Radiation Oncology         [phone]   Name: Gerald Gomez MRN: [id number]   Date: 11/01/2014  DOB: July 03, 1940   Weekly Radiation Therapy Management  Dignosis: Gerald Gomez is a 74 year old gentleman presenting to clinic in regards to his C61 malignant neoplasm of the prostate.            Current Dose: 60.45 Gy  Planned Dose:  78 Gy   Narrative Patient presents to the clinic today to discuss if continuing treatment is recommended. The patient has completed 33 fractions to his prostate with 7 treatments remaining. Patient received a treatment on Monday of last week but, missed the remaining four. Reports nausea and vomiting. Reports he was taking OTC dramamine to relieve nausea and vomiting but, this no longer helps. Reports diarrhea. Reports increased pain in low back that radiates down legs. Reports "nerves in his back" are so bad and made worse when assuming treatment position. Reports he has been unable to sleep since Wednesday. Set-up films were reviewed and the chart was checked. He was accompanied by his wife for today's radiation oncology appointment.  Nausea and vomiting has been persisting seen last week.  Patient still uses morphine.  Patient notes experiencing anxiety when receiving treatment.  Physical Findings  weight is 210 lb 6.4 oz (95.437 kg). His blood pressure is 133/64 and his pulse is 79. His respiration is 20.  The patient's weight is essentially stable with no significant changes to be noted at this time.  Impression Gerald Gomez is a 74 year old gentleman presenting to clinic in regards to his malignant neoplasm of the prostate. The patient is experiencing symptoms of nausea and vomiting which is unlikely related to radiation treatment. Patient would benefit from taking Ativan to treat his anxiety before receiving treatment.  Plan All questions vocalized via the patient were fully addressed. Common symptoms to expect at this time in the patient's recovery were discussed  and reviewed.   The patient has been advised to continue treatment as planned. If he or his wife develop further questions or concerns in regards to his treatment and recovery, he has been encouraged to contact me.  Refilled nausea medication, wrote prescription for Ativan.  Patient did not have morphine with him today and so will continue treatment tomorrow with morphine and Ativan.     _________________________________________   Sheral Apley. [name], M.D.  This document serves as a record of services personally performed by Tyler Pita, MD. It was created on his behalf by Derek Mound, a trained medical scribe. The creation of this record is based on the scribe's personal observations and the provider's statements to them. This document has been checked and approved by the attending provider.

## 2014-11-02 ENCOUNTER — Ambulatory Visit
Admission: RE | Admit: 2014-11-02 | Discharge: 2014-11-02 | Disposition: A | Discharge status: Home / Self Care | Payer: Medicare Other | Source: Ambulatory Visit | Attending: Radiation Oncology | Admitting: Radiation Oncology

## 2014-11-02 ENCOUNTER — Ambulatory Visit: Payer: Medicare Other

## 2014-11-03 ENCOUNTER — Ambulatory Visit: Payer: Medicare Other

## 2014-11-03 ENCOUNTER — Ambulatory Visit
Admission: RE | Admit: 2014-11-03 | Discharge: 2014-11-03 | Disposition: A | Discharge status: Home / Self Care | Payer: Medicare Other | Source: Ambulatory Visit | Attending: Radiation Oncology | Admitting: Radiation Oncology

## 2014-11-03 ENCOUNTER — Encounter: Payer: Self-pay | Admitting: Radiation Oncology

## 2014-11-04 ENCOUNTER — Inpatient Hospital Stay (HOSPITAL_COMMUNITY): Payer: Medicare Other

## 2014-11-04 ENCOUNTER — Emergency Department (HOSPITAL_COMMUNITY): Payer: Medicare Other

## 2014-11-04 ENCOUNTER — Ambulatory Visit: Payer: Medicare Other

## 2014-11-04 ENCOUNTER — Inpatient Hospital Stay (HOSPITAL_COMMUNITY)
Admission: EM | Admit: 2014-11-04 | Discharge: 2014-11-07 | DRG: 191 | Disposition: A | Discharge status: Home / Self Care | Payer: Medicare Other | Attending: Internal Medicine | Admitting: Internal Medicine

## 2014-11-04 ENCOUNTER — Encounter (HOSPITAL_COMMUNITY): Payer: Self-pay | Admitting: Internal Medicine

## 2014-11-04 DIAGNOSIS — E785 Hyperlipidemia, unspecified: Secondary | ICD-10-CM | POA: Diagnosis present

## 2014-11-04 DIAGNOSIS — E860 Dehydration: Secondary | ICD-10-CM | POA: Diagnosis not present

## 2014-11-04 DIAGNOSIS — M545 Low back pain, unspecified: Secondary | ICD-10-CM | POA: Diagnosis present

## 2014-11-04 DIAGNOSIS — D649 Anemia, unspecified: Secondary | ICD-10-CM | POA: Diagnosis present

## 2014-11-04 DIAGNOSIS — B1920 Unspecified viral hepatitis C without hepatic coma: Secondary | ICD-10-CM | POA: Diagnosis present

## 2014-11-04 DIAGNOSIS — F1021 Alcohol dependence, in remission: Secondary | ICD-10-CM | POA: Diagnosis present

## 2014-11-04 DIAGNOSIS — N183 Chronic kidney disease, stage 3 unspecified: Secondary | ICD-10-CM | POA: Diagnosis present

## 2014-11-04 DIAGNOSIS — C61 Malignant neoplasm of prostate: Secondary | ICD-10-CM | POA: Diagnosis present

## 2014-11-04 DIAGNOSIS — E875 Hyperkalemia: Secondary | ICD-10-CM | POA: Diagnosis not present

## 2014-11-04 DIAGNOSIS — D696 Thrombocytopenia, unspecified: Secondary | ICD-10-CM | POA: Diagnosis present

## 2014-11-04 DIAGNOSIS — A419 Sepsis, unspecified organism: Secondary | ICD-10-CM | POA: Diagnosis present

## 2014-11-04 DIAGNOSIS — K5790 Diverticulosis of intestine, part unspecified, without perforation or abscess without bleeding: Secondary | ICD-10-CM | POA: Diagnosis present

## 2014-11-04 DIAGNOSIS — E440 Moderate protein-calorie malnutrition: Secondary | ICD-10-CM | POA: Diagnosis present

## 2014-11-04 DIAGNOSIS — F419 Anxiety disorder, unspecified: Secondary | ICD-10-CM | POA: Diagnosis present

## 2014-11-04 DIAGNOSIS — R0600 Dyspnea, unspecified: Secondary | ICD-10-CM

## 2014-11-04 DIAGNOSIS — I5032 Chronic diastolic (congestive) heart failure: Secondary | ICD-10-CM | POA: Diagnosis not present

## 2014-11-04 DIAGNOSIS — M1990 Unspecified osteoarthritis, unspecified site: Secondary | ICD-10-CM | POA: Diagnosis present

## 2014-11-04 DIAGNOSIS — Z7951 Long term (current) use of inhaled steroids: Secondary | ICD-10-CM

## 2014-11-04 DIAGNOSIS — G8929 Other chronic pain: Secondary | ICD-10-CM | POA: Diagnosis present

## 2014-11-04 DIAGNOSIS — N529 Male erectile dysfunction, unspecified: Secondary | ICD-10-CM | POA: Diagnosis present

## 2014-11-04 DIAGNOSIS — J45909 Unspecified asthma, uncomplicated: Secondary | ICD-10-CM | POA: Diagnosis present

## 2014-11-04 DIAGNOSIS — J441 Chronic obstructive pulmonary disease with (acute) exacerbation: Principal | ICD-10-CM | POA: Diagnosis present

## 2014-11-04 DIAGNOSIS — N189 Chronic kidney disease, unspecified: Secondary | ICD-10-CM

## 2014-11-04 DIAGNOSIS — R0609 Other forms of dyspnea: Secondary | ICD-10-CM

## 2014-11-04 DIAGNOSIS — G20 Parkinson's disease: Secondary | ICD-10-CM | POA: Diagnosis present

## 2014-11-04 DIAGNOSIS — I110 Hypertensive heart disease with heart failure: Secondary | ICD-10-CM | POA: Diagnosis present

## 2014-11-04 DIAGNOSIS — Z79899 Other long term (current) drug therapy: Secondary | ICD-10-CM | POA: Diagnosis not present

## 2014-11-04 DIAGNOSIS — D899 Disorder involving the immune mechanism, unspecified: Secondary | ICD-10-CM | POA: Diagnosis present

## 2014-11-04 DIAGNOSIS — D631 Anemia in chronic kidney disease: Secondary | ICD-10-CM | POA: Diagnosis present

## 2014-11-04 DIAGNOSIS — Z885 Allergy status to narcotic agent status: Secondary | ICD-10-CM

## 2014-11-04 DIAGNOSIS — Z79891 Long term (current) use of opiate analgesic: Secondary | ICD-10-CM | POA: Diagnosis not present

## 2014-11-04 DIAGNOSIS — Z510 Encounter for antineoplastic radiation therapy: Secondary | ICD-10-CM | POA: Diagnosis not present

## 2014-11-04 DIAGNOSIS — I129 Hypertensive chronic kidney disease with stage 1 through stage 4 chronic kidney disease, or unspecified chronic kidney disease: Secondary | ICD-10-CM | POA: Diagnosis present

## 2014-11-04 DIAGNOSIS — Z87891 Personal history of nicotine dependence: Secondary | ICD-10-CM

## 2014-11-04 DIAGNOSIS — K219 Gastro-esophageal reflux disease without esophagitis: Secondary | ICD-10-CM | POA: Diagnosis present

## 2014-11-04 DIAGNOSIS — R0602 Shortness of breath: Secondary | ICD-10-CM | POA: Diagnosis not present

## 2014-11-04 DIAGNOSIS — F329 Major depressive disorder, single episode, unspecified: Secondary | ICD-10-CM | POA: Diagnosis present

## 2014-11-04 DIAGNOSIS — N179 Acute kidney failure, unspecified: Secondary | ICD-10-CM | POA: Diagnosis not present

## 2014-11-04 DIAGNOSIS — G20A1 Parkinson's disease without dyskinesia, without mention of fluctuations: Secondary | ICD-10-CM | POA: Diagnosis present

## 2014-11-04 HISTORY — DX: Dyspnea, unspecified: R06.00

## 2014-11-04 HISTORY — DX: Malignant neoplasm of prostate: C61

## 2014-11-04 HISTORY — DX: Pneumonia, unspecified organism: J18.9

## 2014-11-04 HISTORY — DX: Anemia, unspecified: D64.9

## 2014-11-04 HISTORY — DX: Personal history of peptic ulcer disease: Z87.11

## 2014-11-04 HISTORY — DX: Personal history of other diseases of the digestive system: Z87.19

## 2014-11-04 HISTORY — DX: Unspecified osteoarthritis, unspecified site: M19.90

## 2014-11-04 HISTORY — DX: Chronic kidney disease, stage 3 (moderate): N18.3

## 2014-11-04 HISTORY — DX: Cardiac murmur, unspecified: R01.1

## 2014-11-04 HISTORY — DX: Other forms of dyspnea: R06.09

## 2014-11-04 HISTORY — DX: Chronic kidney disease, stage 3 unspecified: N18.30

## 2014-11-04 HISTORY — DX: Thrombocytopenia, unspecified: D69.6

## 2014-11-04 LAB — CBC WITH DIFFERENTIAL/PLATELET
BASOS ABS: 0 10*3/uL (ref 0.0–0.1)
Basophils Relative: 0 % (ref 0–1)
Eosinophils Absolute: 0.1 10*3/uL (ref 0.0–0.7)
Eosinophils Relative: 1 % (ref 0–5)
HEMATOCRIT: 29 % — AB (ref 39.0–52.0)
Hemoglobin: 8.9 g/dL — ABNORMAL LOW (ref 13.0–17.0)
LYMPHS PCT: 15 % (ref 12–46)
Lymphs Abs: 1 10*3/uL (ref 0.7–4.0)
MCH: 27.1 pg (ref 26.0–34.0)
MCHC: 30.7 g/dL (ref 30.0–36.0)
MCV: 88.4 fL (ref 78.0–100.0)
MONO ABS: 0.9 10*3/uL (ref 0.1–1.0)
Monocytes Relative: 14 % — ABNORMAL HIGH (ref 3–12)
NEUTROS ABS: 4.4 10*3/uL (ref 1.7–7.7)
Neutrophils Relative %: 69 % (ref 43–77)
Platelets: 84 10*3/uL — ABNORMAL LOW (ref 150–400)
RBC: 3.28 MIL/uL — ABNORMAL LOW (ref 4.22–5.81)
RDW: 14.9 % (ref 11.5–15.5)
WBC: 6.4 10*3/uL (ref 4.0–10.5)

## 2014-11-04 LAB — URINE MICROSCOPIC-ADD ON

## 2014-11-04 LAB — COMPREHENSIVE METABOLIC PANEL
ALBUMIN: 3 g/dL — AB (ref 3.5–5.0)
ALK PHOS: 78 U/L (ref 38–126)
ALT: 16 U/L — ABNORMAL LOW (ref 17–63)
ANION GAP: 11 (ref 5–15)
AST: 54 U/L — AB (ref 15–41)
BUN: 64 mg/dL — ABNORMAL HIGH (ref 6–20)
CO2: 18 mmol/L — ABNORMAL LOW (ref 22–32)
Calcium: 9.3 mg/dL (ref 8.9–10.3)
Chloride: 110 mmol/L (ref 101–111)
Creatinine, Ser: 2.75 mg/dL — ABNORMAL HIGH (ref 0.61–1.24)
GFR calc Af Amer: 24 mL/min — ABNORMAL LOW (ref >60)
GFR calc non Af Amer: 21 mL/min — ABNORMAL LOW (ref >60)
Glucose, Bld: 117 mg/dL — ABNORMAL HIGH (ref 65–99)
POTASSIUM: 5.2 mmol/L — AB (ref 3.5–5.1)
SODIUM: 139 mmol/L (ref 135–145)
TOTAL PROTEIN: 8 g/dL (ref 6.5–8.1)
Total Bilirubin: 0.5 mg/dL (ref 0.3–1.2)

## 2014-11-04 LAB — URINALYSIS, ROUTINE W REFLEX MICROSCOPIC
BILIRUBIN URINE: NEGATIVE
GLUCOSE, UA: NEGATIVE mg/dL
Ketones, ur: NEGATIVE mg/dL
Leukocytes, UA: NEGATIVE
Nitrite: NEGATIVE
PH: 5 (ref 5.0–8.0)
Protein, ur: NEGATIVE mg/dL
SPECIFIC GRAVITY, URINE: 1.014 (ref 1.005–1.030)
Urobilinogen, UA: 0.2 mg/dL (ref 0.0–1.0)

## 2014-11-04 LAB — I-STAT CG4 LACTIC ACID, ED
LACTIC ACID, VENOUS: 0.79 mmol/L (ref 0.5–2.0)
LACTIC ACID, VENOUS: 0.96 mmol/L (ref 0.5–2.0)

## 2014-11-04 LAB — TROPONIN I: TROPONIN I: 0.03 ng/mL (ref <0.031)

## 2014-11-04 LAB — BRAIN NATRIURETIC PEPTIDE: B Natriuretic Peptide: 222.2 pg/mL — ABNORMAL HIGH (ref 0.0–100.0)

## 2014-11-04 MED ORDER — ACETAMINOPHEN 650 MG RE SUPP: 650.0000 mg | Freq: Four times a day (QID) | RECTAL | Status: DC | PRN

## 2014-11-04 MED ORDER — CITALOPRAM HYDROBROMIDE 20 MG PO TABS
10.0000 mg | ORAL_TABLET | Freq: Every day | ORAL | Status: DC
  Administered 2014-11-04 – 2014-11-07 (×4): 10 mg via ORAL
  Filled 2014-11-04: qty 0.5
  Filled 2014-11-04 (×4): qty 1

## 2014-11-04 MED ORDER — SODIUM CHLORIDE 0.9 % IV BOLUS (SEPSIS)
1000.0000 mL | INTRAVENOUS | Status: AC
  Administered 2014-11-04 (×2): 1000 mL via INTRAVENOUS

## 2014-11-04 MED ORDER — TAMSULOSIN HCL 0.4 MG PO CAPS
0.4000 mg | ORAL_CAPSULE | Freq: Every day | ORAL | Status: DC
  Administered 2014-11-04 – 2014-11-07 (×4): 0.4 mg via ORAL
  Filled 2014-11-04 (×5): qty 1

## 2014-11-04 MED ORDER — PIPERACILLIN-TAZOBACTAM 3.375 G IVPB 30 MIN
3.3750 g | Freq: Once | INTRAVENOUS | Status: AC
  Administered 2014-11-04: 3.375 g via INTRAVENOUS
  Filled 2014-11-04: qty 50

## 2014-11-04 MED ORDER — ALBUTEROL SULFATE (2.5 MG/3ML) 0.083% IN NEBU
2.5000 mg | INHALATION_SOLUTION | RESPIRATORY_TRACT | Status: DC | PRN
  Administered 2014-11-04 – 2014-11-07 (×2): 2.5 mg via RESPIRATORY_TRACT
  Filled 2014-11-04 (×3): qty 3

## 2014-11-04 MED ORDER — ACETAMINOPHEN 325 MG PO TABS: 650.0000 mg | ORAL_TABLET | Freq: Four times a day (QID) | ORAL | Status: DC | PRN

## 2014-11-04 MED ORDER — METOPROLOL TARTRATE 25 MG PO TABS
25.0000 mg | ORAL_TABLET | Freq: Two times a day (BID) | ORAL | Status: DC
  Administered 2014-11-04 – 2014-11-07 (×6): 25 mg via ORAL
  Filled 2014-11-04 (×7): qty 1

## 2014-11-04 MED ORDER — LORAZEPAM 0.5 MG PO TABS: 0.5000 mg | ORAL_TABLET | Freq: Three times a day (TID) | ORAL | Status: DC | PRN

## 2014-11-04 MED ORDER — ACETAMINOPHEN 325 MG PO TABS
650.0000 mg | ORAL_TABLET | Freq: Once | ORAL | Status: AC | PRN
  Administered 2014-11-04: 650 mg via ORAL
  Filled 2014-11-04: qty 2

## 2014-11-04 MED ORDER — CYCLOSPORINE 0.05 % OP EMUL
1.0000 [drp] | Freq: Two times a day (BID) | OPHTHALMIC | Status: DC
  Administered 2014-11-04 – 2014-11-07 (×6): 1 [drp] via OPHTHALMIC
  Filled 2014-11-04 (×7): qty 1

## 2014-11-04 MED ORDER — CARBIDOPA-LEVODOPA 25-100 MG PO TABS
1.0000 | ORAL_TABLET | Freq: Three times a day (TID) | ORAL | Status: DC
  Administered 2014-11-04 – 2014-11-07 (×9): 1 via ORAL
  Filled 2014-11-04 (×10): qty 1

## 2014-11-04 MED ORDER — FESOTERODINE FUMARATE ER 4 MG PO TB24
4.0000 mg | ORAL_TABLET | Freq: Every day | ORAL | Status: DC
  Administered 2014-11-05 – 2014-11-07 (×3): 4 mg via ORAL
  Filled 2014-11-04 (×5): qty 1

## 2014-11-04 MED ORDER — GUAIFENESIN ER 600 MG PO TB12
1200.0000 mg | ORAL_TABLET | Freq: Two times a day (BID) | ORAL | Status: DC
  Administered 2014-11-04 – 2014-11-07 (×6): 1200 mg via ORAL
  Filled 2014-11-04 (×7): qty 2

## 2014-11-04 MED ORDER — SODIUM CHLORIDE 0.9 % IV SOLN
INTRAVENOUS | Status: DC
  Administered 2014-11-04: 18:00:00 via INTRAVENOUS

## 2014-11-04 MED ORDER — FLUTICASONE PROPIONATE 50 MCG/ACT NA SUSP
2.0000 | Freq: Every day | NASAL | Status: DC
  Administered 2014-11-04 – 2014-11-07 (×4): 2 via NASAL
  Filled 2014-11-04: qty 16

## 2014-11-04 MED ORDER — SODIUM CHLORIDE 0.9 % IJ SOLN
3.0000 mL | Freq: Two times a day (BID) | INTRAMUSCULAR | Status: DC
  Administered 2014-11-04 – 2014-11-07 (×5): 3 mL via INTRAVENOUS

## 2014-11-04 MED ORDER — VANCOMYCIN HCL IN DEXTROSE 1-5 GM/200ML-% IV SOLN: 1000.0000 mg | Freq: Once | INTRAVENOUS | Status: DC

## 2014-11-04 MED ORDER — MORPHINE SULFATE 15 MG PO TABS
15.0000 mg | ORAL_TABLET | ORAL | Status: DC | PRN
  Administered 2014-11-05: 15 mg via ORAL
  Filled 2014-11-04 (×2): qty 1

## 2014-11-04 MED ORDER — GABAPENTIN 100 MG PO CAPS
200.0000 mg | ORAL_CAPSULE | Freq: Three times a day (TID) | ORAL | Status: DC
  Administered 2014-11-04 – 2014-11-07 (×9): 200 mg via ORAL
  Filled 2014-11-04 (×11): qty 2

## 2014-11-04 MED ORDER — VANCOMYCIN HCL IN DEXTROSE 1-5 GM/200ML-% IV SOLN
1000.0000 mg | INTRAVENOUS | Status: DC
  Administered 2014-11-05: 1000 mg via INTRAVENOUS
  Filled 2014-11-04: qty 200

## 2014-11-04 MED ORDER — GUAIFENESIN-DM 100-10 MG/5ML PO SYRP: 5.0000 mL | ORAL_SOLUTION | ORAL | Status: DC | PRN

## 2014-11-04 MED ORDER — PIPERACILLIN-TAZOBACTAM 3.375 G IVPB
3.3750 g | Freq: Three times a day (TID) | INTRAVENOUS | Status: DC
  Administered 2014-11-04 – 2014-11-06 (×6): 3.375 g via INTRAVENOUS
  Filled 2014-11-04 (×7): qty 50

## 2014-11-04 MED ORDER — HEPARIN SODIUM (PORCINE) 5000 UNIT/ML IJ SOLN
5000.0000 [IU] | Freq: Three times a day (TID) | INTRAMUSCULAR | Status: DC
  Administered 2014-11-04 – 2014-11-07 (×7): 5000 [IU] via SUBCUTANEOUS
  Filled 2014-11-04 (×8): qty 1

## 2014-11-04 MED ORDER — VANCOMYCIN HCL 10 G IV SOLR
2000.0000 mg | Freq: Once | INTRAVENOUS | Status: AC
  Administered 2014-11-04: 2000 mg via INTRAVENOUS
  Filled 2014-11-04: qty 2000

## 2014-11-04 MED ORDER — HYDROCORTISONE 1 % EX OINT
TOPICAL_OINTMENT | Freq: Two times a day (BID) | CUTANEOUS | Status: DC
  Administered 2014-11-05 – 2014-11-07 (×4): via TOPICAL
  Filled 2014-11-04: qty 28.35

## 2014-11-04 MED ORDER — MORPHINE SULFATE 4 MG/ML IJ SOLN
4.0000 mg | Freq: Once | INTRAMUSCULAR | Status: AC
  Administered 2014-11-04: 4 mg via INTRAVENOUS
  Filled 2014-11-04: qty 1

## 2014-11-04 NOTE — ED Notes (Signed)
In-out cath performed by [facility] EMT. Peri-care provided by Lanny Cramp. Sterile technique observed by Lanny Cramp. 266mL output.

## 2014-11-04 NOTE — Progress Notes (Signed)
Pt states no complaints. Denies pain. No apparent distress. Family at bedside. Resting-watching TV.

## 2014-11-04 NOTE — Progress Notes (Signed)
ANTIBIOTIC CONSULT NOTE - INITIAL  Pharmacy Consult for Zosyn and Vancomycin Indication: Sepsis  Allergies  Allergen Reactions  . Tramadol Nausea Only    Patient Measurements: Height: 6' (182.9 cm) Weight: 210 lb (95.255 kg) IBW/kg (Calculated) : 77.6  Vital Signs: Temp: 101 F (38.3 C) (08/11 0755) Temp Source: Rectal (08/11 0755) BP: 130/85 mmHg (08/11 0754) Pulse Rate: 90 (08/11 0800) Intake/Output from previous day:   Intake/Output from this shift:    Labs: No results for input(s): WBC, HGB, PLT, LABCREA, CREATININE in the last 72 hours. CrCl cannot be calculated (Patient has no serum creatinine result on file.). No results for input(s): VANCOTROUGH, VANCOPEAK, VANCORANDOM, GENTTROUGH, GENTPEAK, GENTRANDOM, TOBRATROUGH, TOBRAPEAK, TOBRARND, AMIKACINPEAK, AMIKACINTROU, AMIKACIN in the last 72 hours.   Microbiology: No results found for this or any previous visit (from the past 720 hour(s)).  Medical History: Past Medical History  Diagnosis Date  . ALLERGIC RHINITIS 03/28/2009  . ANXIETY 03/28/2009  . CHOLELITHIASIS 03/28/2009  . DEGENERATIVE JOINT DISEASE 03/28/2009  . DEPRESSION 03/28/2009  . [place] DISEASE, LUMBAR 03/28/2009  . DIVERTICULOSIS, COLON 03/28/2009  . 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 FATIGUE 03/28/2009  . HEPATITIS C 03/28/2009  . HYPERLIPIDEMIA 03/28/2009  . HYPERTENSION 03/28/2009  . LOW BACK PAIN, CHRONIC 03/28/2009  . PERIPHERAL VASCULAR DISEASE 03/28/2009  . Personal history of alcoholism 03/28/2009  . RENAL INSUFFICIENCY 03/28/2009  . THROMBOCYTOPENIA 03/28/2009  . Wheezing 02/07/2010  . CHF (congestive heart failure)   . Impaired glucose tolerance 12/01/2013  . Prostate cancer    Assessment: 74 yo M presents on 8/11 with fever and SOB. Pharmacy consulted to dose abx for sepsis. Zosyn x 1 ordered in the ED. Tmax up to 101, WBC wnl. PMH includes CKD, BL SCr ~2.3. SCr now up to 2.75, CrCl ~48ml/min. Lactic acid 0.79.   Goal of Therapy:  Vancomycin trough level  15-20 mcg/ml  Resolution of infection  Plan:  Continue Zosyn 3.375 gm IV q8h (4 hour infusion) Give vancomycin 2g IV x 1 for load dose, then start vancomycin 1g IV Q24 Monitor clinical picture, renal function (may need less frequent dosing if SCr continues to trend up), VT at Css F/U C&S, abx deescalation / LOT  Shantera Monts J 11/04/2014,8:16 AM

## 2014-11-04 NOTE — ED Provider Notes (Signed)
CSN: [id number]     Arrival date & time 11/04/14  0745 History   First MD Initiated Contact with Patient 11/04/14 [phone]     Chief Complaint  Patient presents with  . Fever  . Shortness of Breath     (Consider location/radiation/quality/duration/timing/severity/associated sxs/prior Treatment) HPI Comments: 74 year old male with extensive past medical history including prostate cancer on chemotherapy, CHF, hypertension, hyperlipidemia, hepatitis C, chronic back pain who presents with shortness of breath. The patient states that yesterday he began not feeling well and started having shortness of breath. The shortness of breath has worsened since it began. He has not noticed any fevers at home. No chest pain. No vomiting or abdominal pain. He did have an episode of nonbloody diarrhea yesterday. No urinary symptoms. Because of his difficulty breathing, he called EMS who noted him to be tachypneic and febrile during transport. No sick contacts.  Patient is a 74 y.o. male presenting with fever and shortness of breath. The history is provided by the patient.  Fever Shortness of Breath Associated symptoms: fever     Past Medical History  Diagnosis Date  . ALLERGIC RHINITIS 03/28/2009  . ANXIETY 03/28/2009  . CHOLELITHIASIS 03/28/2009  . DEGENERATIVE JOINT DISEASE 03/28/2009  . DEPRESSION 03/28/2009  . [place] DISEASE, LUMBAR 03/28/2009  . DIVERTICULOSIS, COLON 03/28/2009  . 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 FATIGUE 03/28/2009  . HEPATITIS C 03/28/2009  . HYPERLIPIDEMIA 03/28/2009  . HYPERTENSION 03/28/2009  . LOW BACK PAIN, CHRONIC 03/28/2009  . PERIPHERAL VASCULAR DISEASE 03/28/2009  . Personal history of alcoholism 03/28/2009  . RENAL INSUFFICIENCY 03/28/2009  . THROMBOCYTOPENIA 03/28/2009  . Wheezing 02/07/2010  . CHF (congestive heart failure)   . Impaired glucose tolerance 12/01/2013  . Prostate cancer    Past Surgical History  Procedure Laterality Date  . Shoulder arthroscopy w/ rotator cuff repair Right  2003  . Cataract extraction w/ intraocular lens  implant, bilateral Bilateral   . Prostate biopsy    . Colonoscopy w/ polypectomy  06/29/2014    polpys proved to be non cancerous done at [facility] History  Problem Relation Age of Onset  . Lung cancer Father   . Stroke Brother   . Breast cancer Daughter    Social History  Substance Use Topics  . Smoking status: Former Smoker -- 0.25 packs/day    Types: Cigarettes    Quit date: 03/27/1995  . Smokeless tobacco: Never Used  . Alcohol Use: No     Comment: quit drinking 1997    Review of Systems  Constitutional: Positive for fever.  Respiratory: Positive for shortness of breath.    10 Systems reviewed and are negative for acute change except as noted in the HPI.    Allergies  Tramadol  Home Medications   Prior to Admission medications   Medication Sig Start Date End Date Taking? Authorizing Provider  albuterol (PROVENTIL HFA;VENTOLIN HFA) 108 (90 BASE) MCG/ACT inhaler Inhale 2 puffs into the lungs every 6 (six) hours as needed for wheezing or shortness of breath. Dispense Ventolin HFA please 06/01/14   [name], MD  carbidopa-levodopa (SINEMET IR) 25-100 MG per tablet Take 1 tablet by mouth 3 (three) times daily. 07/02/14   [name], DO  citalopram (CELEXA) 10 MG tablet take 1 tablet by mouth once daily 04/19/14   [name], MD  cycloSPORINE (RESTASIS) 0.05 % ophthalmic emulsion Place 1 drop into both eyes 2 (two) times daily.    Historical Provider, MD  Diclofenac  Sodium 2 % SOLN Apply 1 pump twice daily. 02/25/14   Lyndal Pulley, DO  fluticasone (FLONASE) 50 MCG/ACT nasal spray Place 2 sprays into both nostrils daily.    Historical Provider, MD  furosemide (LASIX) 40 MG tablet  08/24/14   Historical Provider, MD  gabapentin (NEURONTIN) 100 MG capsule Take 1 capsule (100 mg total) by mouth 3 (three) times daily. 09/09/14   [name], MD  HYDROcodone-acetaminophen (NORCO/VICODIN) 5-325 MG per tablet Take 1-2  tablets by mouth every 6 (six) hours as needed for moderate pain (30 min before radiation). Patient not taking: Reported on 10/21/2014 09/13/14   Tyler Pita, MD  hydrocortisone 2.5 % ointment Apply to affected area BID. 02/15/14   Lyndal Pulley, DO  ibuprofen (ADVIL,MOTRIN) 800 MG tablet Take 800 mg by mouth 2 (two) times daily.    Historical Provider, MD  LORazepam (ATIVAN) 1 MG tablet Take 1 tablet (1 mg total) by mouth every 8 (eight) hours as needed for anxiety (30 min before radiation). 11/01/14   Tyler Pita, MD  metoprolol tartrate (LOPRESSOR) 25 MG tablet take 1 tablet by mouth twice a day 07/21/14   [name], MD  morphine (MSIR) 15 MG tablet TAKE 1 TABLET BY MOUTH EVERY 4 HOURS AS NEEDED FOR PAIN AND 30 MINUTES BEFORE RADIATION 09/02/14   Historical Provider, MD  potassium chloride (K-DUR,KLOR-CON) 10 MEQ tablet  08/04/14   Historical Provider, MD  prochlorperazine (COMPAZINE) 10 MG tablet Take 1 tablet (10 mg total) by mouth every 6 (six) hours as needed for nausea or vomiting. 11/01/14   Tyler Pita, MD  tamsulosin [facility]) 0.4 MG CAPS capsule take 1 capsule by mouth once daily 11/27/13   [name], MD  TOVIAZ 4 MG TB24 tablet Take 4 mg by mouth daily. 05/06/14   Historical Provider, MD   BP 119/81 mmHg  Pulse 85  Temp(Src) 101 F (38.3 C) (Rectal)  Resp 20  Ht 6' (1.829 m)  Wt 210 lb (95.255 kg)  BMI 28.47 kg/m2  SpO2 96% Physical Exam  Constitutional: He is oriented to person, place, and time. No distress.  Awake, alert, uncomfortable and mildly SOB but NAD  HENT:  Head: Normocephalic and atraumatic.  Dry mucous membranes  Eyes: Conjunctivae are normal. Pupils are equal, round, and reactive to light.  Neck: Neck supple.  Cardiovascular: Normal rate, regular rhythm and normal heart sounds.   No murmur heard. Pulmonary/Chest: No stridor.  Mildly increased work of breathing with faint crackles and diminished breath sounds bilateral lower lungs  Abdominal: Soft.  Bowel sounds are normal. He exhibits no distension. There is no tenderness.  Musculoskeletal: He exhibits no edema.  Neurological: He is alert and oriented to person, place, and time.  Fluent speech  Skin: Skin is warm and dry. No rash noted.  Psychiatric: He has a normal mood and affect. Judgment normal.  Nursing note and vitals reviewed.   ED Course  Procedures (including critical care time) Labs Review Labs Reviewed  COMPREHENSIVE METABOLIC PANEL - Abnormal; Notable for the following:    Potassium 5.2 (*)    CO2 18 (*)    Glucose, Bld 117 (*)    BUN 64 (*)    Creatinine, Ser 2.75 (*)    Albumin 3.0 (*)    AST 54 (*)    ALT 16 (*)    GFR calc non Af Amer 21 (*)    GFR calc Af Amer 24 (*)    All other components  within normal limits  CBC WITH DIFFERENTIAL/PLATELET - Abnormal; Notable for the following:    RBC 3.28 (*)    Hemoglobin 8.9 (*)    HCT 29.0 (*)    Platelets 84 (*)    Monocytes Relative 14 (*)    All other components within normal limits  BRAIN NATRIURETIC PEPTIDE - Abnormal; Notable for the following:    B Natriuretic Peptide 222.2 (*)    All other components within normal limits  CULTURE, BLOOD (ROUTINE X 2)  CULTURE, BLOOD (ROUTINE X 2)  URINE CULTURE  URINALYSIS, ROUTINE W REFLEX MICROSCOPIC (NOT AT [facility])  I-STAT CG4 LACTIC ACID, ED    Imaging Review Dg Chest 2 View  11/04/2014   CLINICAL DATA:  Shortness of breath. Currently undergoing radiation therapy for prostate cancer.  EXAM: CHEST  2 VIEW  COMPARISON:  09/08/2014 and 02/16/2014  FINDINGS: The heart size and mediastinal contours are within normal limits. Both lungs are clear. Previous resection of the distal right clavicle. Arthritic changes of the right shoulder.  IMPRESSION: No active cardiopulmonary disease.   Electronically Signed   By: Lorriane Shire M.D.   On: 11/04/2014 08:47     EKG Interpretation   Date/Time:  Thursday November 04 2014 07:52:11 EDT Ventricular Rate:  90 PR Interval:   178 QRS Duration: 80 QT Interval:  404 QTC Calculation: 494 R Axis:   49 Text Interpretation:  Sinus rhythm ST elevation, consider anterior injury  Borderline prolonged QT interval No significant change since last tracing  Confirmed by Cherith Tewell MD, Karen Huhta [id number]) on 11/04/2014 7:55:54 AM      MDM   Final diagnoses:  None  fever in immunocompromised patient Shortness of breath Acute on chronic kidney disease Hyperkalemia, mild H/o prostate cancer on chemotherapy   74 year old male with multiple medical problems including prostate cancer on chemotherapy presents with worsening shortness of breath for the past 2 days. He was brought in by EMS. On arrival, the patient was awake, alert, with increased work of breathing but no respiratory distress. Vital signs notable for O2 sat 93-94% on room air. Faint crackles heard on exam. Because of the patient's immunocompromised state, and initiated a code sepsis with blood and urine cultures, lab work listed above including lactate, IV fluid bolus, and broad-spectrum and about including vancomycin and since then. EKG on arrival is unchanged from previous and shows no evidence of ischemia.  Labs show a mildly elevated potassium at 5.2, acute on chronic kidney disease with a creatinine of 2.75, normal lactate, stable BNP at 222. Chest x-ray does not show acute process, however the patient's crackles on exam, Normal O2 sat, and fever accompanied by shortness of breath make pneumonia a likely etiology of his fever. I have considered PE given the patient's cancer risk but feel this is less likely in the setting of fever and likely infectious process; furthermore, I cannot obtain a CTA of the chest at this time because of the patient's kidney disease. The patient will be admitted to general medicine for further care.  Sharlett Iles, MD 11/04/14 [phone]

## 2014-11-04 NOTE — H&P (Signed)
History and Physical  Gerald Gomez ZOX:[id number] DOB: 1940/04/12 DOA: 11/04/2014  Referring physician: Dr. Wenda Overland little, EDP.  PCP: [name], MD  Outpatient Specialists:  1. Radiation Oncology: Dr. Tyler Pita  Chief Complaint: Cough, wheezing, dyspnea, weakness  HPI: Gerald Gomez is a 74 y.o. male , married, lives with spouse, ambulates with the help of a cane/walker, PMH of prostate cancer with ongoing radiation treatment, not on chemotherapy, former heavy tobacco abuse, asthma/? COPD, chronic diastolic CHF, chronic low back pain, anxiety & depression, hepatitis C, HLD, HTN, stage III chronic kidney disease, chronic anemia and thrombocytopenia, presented to [facility] ED on 11/04/14 with above complaints. History obtained from patient and spouse at bedside. They gave approximately 1 month history of cough with intermittent "black" sputum. He also has intermittent dyspnea associated with wheezing and uses her inhaler and told to have asthma. Last night after returning from radiation treatment, his dyspnea got worse, this was associated with wheezing but no chest pain. He does not use home oxygen. No fevers or chills reported at home. No recent long distance travel, asymmetric leg pain or swelling. He also has become quite weak since yesterday and has had to use the wheelchair. Spouse also indicates that patient has had chronic intermittent confusion for a long time. In the ED, febrile at 101F, other vital signs stable, lactate normal, potassium 5.2, BUN 64, creatinine 2.75, hemoglobin 8.9, platelets 84 and chest x-ray negative for acute cardiopulmonary disease. Sepsis protocol was initiated by EDP and started on IV fluids, IV vancomycin and Zosyn. Hospitalist admission was requested.   Review of Systems: All systems reviewed and apart from history of presenting illness, are negative.  Past Medical History  Diagnosis Date  . ALLERGIC RHINITIS 03/28/2009  . ANXIETY 03/28/2009  .  CHOLELITHIASIS 03/28/2009  . DEGENERATIVE JOINT DISEASE 03/28/2009  . DEPRESSION 03/28/2009  . [place] DISEASE, LUMBAR 03/28/2009  . DIVERTICULOSIS, COLON 03/28/2009  . 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 FATIGUE 03/28/2009  . HEPATITIS C 03/28/2009  . HYPERLIPIDEMIA 03/28/2009  . HYPERTENSION 03/28/2009  . LOW BACK PAIN, CHRONIC 03/28/2009  . PERIPHERAL VASCULAR DISEASE 03/28/2009  . Personal history of alcoholism 03/28/2009  . RENAL INSUFFICIENCY 03/28/2009  . THROMBOCYTOPENIA 03/28/2009  . Wheezing 02/07/2010  . CHF (congestive heart failure)   . Impaired glucose tolerance 12/01/2013  . Prostate cancer    Past Surgical History  Procedure Laterality Date  . Shoulder arthroscopy w/ rotator cuff repair Right 2003  . Cataract extraction w/ intraocular lens  implant, bilateral Bilateral   . Prostate biopsy    . Colonoscopy w/ polypectomy  06/29/2014    polpys proved to be non cancerous done at [place] History:  reports that he quit smoking about 19 years ago. His smoking use included Cigarettes. He smoked 0.25 packs per day. He has never used smokeless tobacco. He reports that he does not drink alcohol or use illicit drugs. Approximately 40-pack-year smoking history-quit 13 years ago as per spouse. No alcohol use.  Allergies  Allergen Reactions  . Tramadol Nausea Only    Family History  Problem Relation Age of Onset  . Lung cancer Father   . Stroke Brother   . Breast cancer Daughter     Prior to Admission medications   Medication Sig Start Date End Date Taking? Authorizing Provider  albuterol (PROVENTIL HFA;VENTOLIN HFA) 108 (90 BASE) MCG/ACT inhaler Inhale 2 puffs into the lungs every 6 (six) hours as needed for wheezing or shortness  of breath. Dispense Ventolin HFA please 06/01/14   [name], MD  carbidopa-levodopa (SINEMET IR) 25-100 MG per tablet Take 1 tablet by mouth 3 (three) times daily. 07/02/14   [name], DO  citalopram (CELEXA) 10 MG tablet take 1 tablet by mouth once daily  04/19/14   [name], MD  cycloSPORINE (RESTASIS) 0.05 % ophthalmic emulsion Place 1 drop into both eyes 2 (two) times daily.    Historical Provider, MD  Diclofenac Sodium 2 % SOLN Apply 1 pump twice daily. 02/25/14   Lyndal Pulley, DO  fluticasone (FLONASE) 50 MCG/ACT nasal spray Place 2 sprays into both nostrils daily.    Historical Provider, MD  furosemide (LASIX) 40 MG tablet  08/24/14   Historical Provider, MD  gabapentin (NEURONTIN) 100 MG capsule Take 1 capsule (100 mg total) by mouth 3 (three) times daily. 09/09/14   [name], MD  HYDROcodone-acetaminophen (NORCO/VICODIN) 5-325 MG per tablet Take 1-2 tablets by mouth every 6 (six) hours as needed for moderate pain (30 min before radiation). Patient not taking: Reported on 10/21/2014 09/13/14   Tyler Pita, MD  hydrocortisone 2.5 % ointment Apply to affected area BID. 02/15/14   Lyndal Pulley, DO  ibuprofen (ADVIL,MOTRIN) 800 MG tablet Take 800 mg by mouth 2 (two) times daily.    Historical Provider, MD  LORazepam (ATIVAN) 1 MG tablet Take 1 tablet (1 mg total) by mouth every 8 (eight) hours as needed for anxiety (30 min before radiation). 11/01/14   Tyler Pita, MD  metoprolol tartrate (LOPRESSOR) 25 MG tablet take 1 tablet by mouth twice a day 07/21/14   [name], MD  potassium chloride (K-DUR,KLOR-CON) 10 MEQ tablet  08/04/14   Historical Provider, MD  prochlorperazine (COMPAZINE) 10 MG tablet Take 1 tablet (10 mg total) by mouth every 6 (six) hours as needed for nausea or vomiting. 11/01/14   Tyler Pita, MD  tamsulosin [facility]) 0.4 MG CAPS capsule take 1 capsule by mouth once daily 11/27/13   [name], MD  TOVIAZ 4 MG TB24 tablet Take 4 mg by mouth daily. 05/06/14   Historical Provider, MD   Physical Exam: Filed Vitals:   11/04/14 0915 11/04/14 0930 11/04/14 0945 11/04/14 1015  BP: 119/81 153/66 132/74 101/53  Pulse: 85 86 82 90  Temp:      TempSrc:      Resp: 20 19 13 18   Height:      Weight:      SpO2: 96%  96% 97% 97%     General exam: Moderately built and nourished pleasant elderly male patient, slightly ill looking, lying comfortably supine on the gurney in no obvious distress.  Head, eyes and ENT: Nontraumatic and normocephalic. Pupils equally reacting to light and accommodation. Oral mucosa dry.  Neck: Supple. No JVD, carotid bruit or thyromegaly.  Lymphatics: No lymphadenopathy.  Respiratory system: Occasional bilateral wheezing but otherwise clear to auscultation. No increased work of breathing.  Cardiovascular system: S1 and S2 heard, RRR. No JVD, murmurs, gallops, clicks or pedal edema.  Gastrointestinal system: Abdomen is nondistended, soft and nontender. Normal bowel sounds heard. No organomegaly or masses appreciated.  Central nervous system: Somnolent but easily arousable and oriented to person and place only. No focal neurological deficits.  Extremities: Symmetric 5 x 5 power. Peripheral pulses symmetrically felt.   Skin: No rashes or acute findings.  Musculoskeletal system: Negative exam.  Psychiatry: Pleasant and cooperative.   Labs on Admission:  Basic Metabolic Panel:  Recent Labs Lab 11/04/14 0810  NA 139  K 5.2*  CL 110  CO2 18*  GLUCOSE 117*  BUN 64*  CREATININE 2.75*  CALCIUM 9.3   Liver Function Tests:  Recent Labs Lab 11/04/14 0810  AST 54*  ALT 16*  ALKPHOS 78  BILITOT 0.5  PROT 8.0  ALBUMIN 3.0*   No results for input(s): LIPASE, AMYLASE in the last 168 hours. No results for input(s): AMMONIA in the last 168 hours. CBC:  Recent Labs Lab 11/04/14 0810  WBC 6.4  NEUTROABS 4.4  HGB 8.9*  HCT 29.0*  MCV 88.4  PLT 84*   Cardiac Enzymes:  Recent Labs Lab 11/04/14 0820  TROPONINI 0.03    BNP (last 3 results) No results for input(s): PROBNP in the last 8760 hours. CBG: No results for input(s): GLUCAP in the last 168 hours.  Radiological Exams on Admission: Dg Chest 2 View  11/04/2014   CLINICAL DATA:  Shortness of  breath. Currently undergoing radiation therapy for prostate cancer.  EXAM: CHEST  2 VIEW  COMPARISON:  09/08/2014 and 02/16/2014  FINDINGS: The heart size and mediastinal contours are within normal limits. Both lungs are clear. Previous resection of the distal right clavicle. Arthritic changes of the right shoulder.  IMPRESSION: No active cardiopulmonary disease.   Electronically Signed   By: Lorriane Shire M.D.   On: 11/04/2014 08:47    EKG: Independently reviewed. Sinus rhythm, normal axis and no acute changes. QTC: 494 ms  Assessment/Plan Principal Problem:   Sepsis- suspecting PNA Active Problems:   Hyperlipidemia   Thrombocytopenia   Essential hypertension   Chronic diastolic heart failure   Anemia   Dehydration   Hyperkalemia   Chronic low back pain   PD (Parkinson's disease) diagnosed 2015, and follwos with Dr. Alfonso Patten. Tat    GERD (gastroesophageal reflux disease)   Malignant neoplasm of prostate   Acute renal failure superimposed on stage 3 chronic kidney disease   1. Sepsis, present on admission: Suspecting pneumonia in immunosuppressed patient versus acute bronchitis. Admit to telemetry. Started on sepsis protocol in ED including IV fluids, IV vancomycin and Zosyn-continue same. Follow blood culture. Urine microscopy-not indicative of UTI. Will repeat chest x-ray in a.m. to see if pneumonia shows up. 2. Dyspnea: May be related to the developing pneumonia, COPD/asthma with mild exacerbation. However concern for the TEE in patient with underlying malignancy and hence agree with obtaining VQ scan to further evaluate. 3. Dehydration: Secondary to febrile illness, poor oral intake and diuretics. Hold diuretics temporarily. IV fluids and follow. 4. Acute on stage III chronic kidney disease: Baseline creatinine probably in the 2.1-2.4 range. Admitting creatinine 2.75. Precipitated by dehydration. Brief IV fluids, hold diuretics and if creatinine does not improve then consider renal ultrasound  to rule out obstructive nephropathy. Hold NSAIDs. 5. Asthma/COPD: Mild exacerbation. Currently improved. Continue antibiotics and nebulizations. Hold steroids for now. 6. Mild hyperkalemia: Secondary to acute kidney injury and potassium supplements. Hold potassium supplements. Treat renal failure and follow BMP in a.m. 7. Normocytic anemia, chronic: Stable. 8. Chronic thrombocytopenia: Stable. Follow CBCs 9. Essential hypertension: Reasonably controlled. Continue home medications. 10. Chronic diastolic CHF: Clinically dehydrated at this time. Management as above. 11. Parkinson's disease: Continue home medications 12. Chronic low back pain: Unchanged. 13. Intermittent confusion, chronic: Possible dementia. 14. Prostate cancer: Undergoing radiation treatment outpatient.    DVT prophylaxis: Subcutaneous heparin Code Status: Full  Family Communication: Discussed with spouse at bedside  Disposition Plan: DC home when medically stable, possibly  in the next 3-4 days.   Time spent: 70 minutes.  Vernell Leep, MD, FACP, FHM. [facility] Pager [phone]  If 7PM-7AM, please contact night-coverage [email or web address] Password [facility] 11/04/2014, 10:37 AM

## 2014-11-04 NOTE — ED Notes (Signed)
Per EMS: hasnt felt well since yesterday. Seems tachypnic and sob at times. Called out for breathing difficulty, speaking in broken sentences, using accessory muscles.  Feels warm.  Had chemo yesterday.  Feels warm to touch.  No room air spo2 obtained by ems.   150/90, 96 bpm occasional PVC.  20 left ac. 93% on room air during triage.  From home.  Sleeps in a recliner.

## 2014-11-04 NOTE — Progress Notes (Signed)
Pt arrived to unit at 1530. 1730 meds not available to admin to pt. Pharm notified at 1730- stated 'working on meds'.

## 2014-11-04 NOTE — Progress Notes (Signed)
RT notified-auditory wheezing

## 2014-11-04 NOTE — ED Notes (Signed)
Pt placed in gown and in bed. Pt monitored by pulse ox, bp cuff, and 12-lead. 

## 2014-11-05 ENCOUNTER — Inpatient Hospital Stay (HOSPITAL_COMMUNITY): Payer: Medicare Other

## 2014-11-05 ENCOUNTER — Ambulatory Visit: Payer: Medicare Other | Admitting: Radiation Oncology

## 2014-11-05 ENCOUNTER — Ambulatory Visit
Admit: 2014-11-05 | Discharge: 2014-11-05 | Disposition: A | Discharge status: Home / Self Care | Payer: Medicare Other | Attending: Radiation Oncology | Admitting: Radiation Oncology

## 2014-11-05 ENCOUNTER — Ambulatory Visit: Payer: Medicare Other

## 2014-11-05 ENCOUNTER — Telehealth: Payer: Self-pay | Admitting: Radiation Oncology

## 2014-11-05 DIAGNOSIS — I5032 Chronic diastolic (congestive) heart failure: Secondary | ICD-10-CM

## 2014-11-05 DIAGNOSIS — N183 Chronic kidney disease, stage 3 (moderate): Secondary | ICD-10-CM

## 2014-11-05 DIAGNOSIS — N179 Acute kidney failure, unspecified: Secondary | ICD-10-CM

## 2014-11-05 LAB — URINE CULTURE: CULTURE: NO GROWTH

## 2014-11-05 LAB — BASIC METABOLIC PANEL
ANION GAP: 7 (ref 5–15)
Anion gap: 8 (ref 5–15)
BUN: 59 mg/dL — ABNORMAL HIGH (ref 6–20)
BUN: 59 mg/dL — ABNORMAL HIGH (ref 6–20)
CHLORIDE: 112 mmol/L — AB (ref 101–111)
CO2: 17 mmol/L — AB (ref 22–32)
CO2: 19 mmol/L — AB (ref 22–32)
Calcium: 8.9 mg/dL (ref 8.9–10.3)
Calcium: 9.1 mg/dL (ref 8.9–10.3)
Chloride: 112 mmol/L — ABNORMAL HIGH (ref 101–111)
Creatinine, Ser: 2.51 mg/dL — ABNORMAL HIGH (ref 0.61–1.24)
Creatinine, Ser: 2.49 mg/dL — ABNORMAL HIGH (ref 0.61–1.24)
GFR calc Af Amer: 27 mL/min — ABNORMAL LOW (ref >60)
GFR calc non Af Amer: 24 mL/min — ABNORMAL LOW (ref >60)
GFR calc non Af Amer: 24 mL/min — ABNORMAL LOW (ref >60)
GFR, EST AFRICAN AMERICAN: 28 mL/min — AB (ref >60)
GLUCOSE: 126 mg/dL — AB (ref 65–99)
Glucose, Bld: 139 mg/dL — ABNORMAL HIGH (ref 65–99)
POTASSIUM: 5.1 mmol/L (ref 3.5–5.1)
Potassium: 5.4 mmol/L — ABNORMAL HIGH (ref 3.5–5.1)
SODIUM: 137 mmol/L (ref 135–145)
SODIUM: 138 mmol/L (ref 135–145)

## 2014-11-05 LAB — CBC
HEMATOCRIT: 24.4 % — AB (ref 39.0–52.0)
Hemoglobin: 7.5 g/dL — ABNORMAL LOW (ref 13.0–17.0)
MCH: 27 pg (ref 26.0–34.0)
MCHC: 30.7 g/dL (ref 30.0–36.0)
MCV: 87.8 fL (ref 78.0–100.0)
PLATELETS: 71 10*3/uL — AB (ref 150–400)
RBC: 2.78 MIL/uL — ABNORMAL LOW (ref 4.22–5.81)
RDW: 14.9 % (ref 11.5–15.5)
WBC: 7.2 10*3/uL (ref 4.0–10.5)

## 2014-11-05 MED ORDER — IPRATROPIUM-ALBUTEROL 0.5-2.5 (3) MG/3ML IN SOLN
3.0000 mL | Freq: Four times a day (QID) | RESPIRATORY_TRACT | Status: DC
  Administered 2014-11-05 (×2): 3 mL via RESPIRATORY_TRACT
  Filled 2014-11-05 (×2): qty 3

## 2014-11-05 MED ORDER — IPRATROPIUM-ALBUTEROL 0.5-2.5 (3) MG/3ML IN SOLN
3.0000 mL | Freq: Four times a day (QID) | RESPIRATORY_TRACT | Status: DC
  Administered 2014-11-06: 3 mL via RESPIRATORY_TRACT
  Filled 2014-11-05: qty 3

## 2014-11-05 MED ORDER — METHYLPREDNISOLONE SODIUM SUCC 125 MG IJ SOLR
60.0000 mg | Freq: Four times a day (QID) | INTRAMUSCULAR | Status: DC
  Administered 2014-11-05 – 2014-11-06 (×5): 60 mg via INTRAVENOUS
  Filled 2014-11-05 (×5): qty 2

## 2014-11-05 NOTE — Progress Notes (Signed)
[facility] PROGRESS NOTE  Gerald Gomez PJK:[id number] DOB: February 20, 1941 DOA: 11/04/2014 PCP: [name], MD  Assessment/Plan: Bronchitis/COPD exacerbation -diffuse wheezing, cough and fever -start IV solumedrol, continue abx, add duonebs -DC VQ scan, clinical picture not c/w PE -atelectasis vs small infiltrate on repeat CXR -DC Vancomcyin and change Zosyn to levaquin tomorrow if cultures stay negative  Acute on stage III chronic kidney disease:  -Baseline creatinine in the 2.1-2.4 range, creatinine was 2.75 -improving with hydration, continue IVF today -stopped NSAIDs, good urine output  Mild hyperkalemia: Secondary to acute kidney injury and potassium supplements -improved    Prostate cancer: Undergoing radiation treatment outpatient -resume XRt Monday  Normocytic anemia, chronic: Stable.  Chronic thrombocytopenia: Stable. Follow CBCs  Essential hypertension: Reasonably controlled. Continue home medications.  Chronic diastolic CHF: Clinically dehydrated at this time. Management as above.  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, chronic   Code Status: Full Code Family Communication: wife and daughetr at bedside Disposition Plan: home in 1-2days  HPI/Subjective: Wheezing and coughing, fevers down  Objective: Filed Vitals:   11/05/14 0510  BP: 158/81  Pulse: 87  Temp: 98.6 F (37 C)  Resp: 18    Intake/Output Summary (Last 24 hours) at 11/05/14 1110 Last data filed at 11/05/14 1011  Gross per 24 hour  Intake   1150 ml  Output    300 ml  Net    850 ml   Filed Weights   11/04/14 0755 11/04/14 1530 11/05/14 0510  Weight: 95.255 kg (210 lb) 97.251 kg (214 lb 6.4 oz) 98 kg (216 lb 0.8 oz)    Exam:   General:  AAOx3, no distress  Cardiovascular: S1S2/RRR  Respiratory: diffuse wheezes and ronchi  Abdomen: soft, Nt, BS present  Musculoskeletal: no edema  c/c  Data Reviewed: Basic Metabolic Panel:  Recent Labs Lab 11/04/14 0810 11/05/14 0620  NA 139 137  K 5.2* 5.4*  CL 110 112*  CO2 18* 17*  GLUCOSE 117* 126*  BUN 64* 59*  CREATININE 2.75* 2.51*  CALCIUM 9.3 8.9   Liver Function Tests:  Recent Labs Lab 11/04/14 0810  AST 54*  ALT 16*  ALKPHOS 78  BILITOT 0.5  PROT 8.0  ALBUMIN 3.0*   No results for input(s): LIPASE, AMYLASE in the last 168 hours. No results for input(s): AMMONIA in the last 168 hours. CBC:  Recent Labs Lab 11/04/14 0810 11/05/14 0620  WBC 6.4 7.2  NEUTROABS 4.4  --   HGB 8.9* 7.5*  HCT 29.0* 24.4*  MCV 88.4 87.8  PLT 84* 71*   Cardiac Enzymes:  Recent Labs Lab 11/04/14 0820  TROPONINI 0.03   BNP (last 3 results)  Recent Labs  11/04/14 0810  BNP 222.2*    ProBNP (last 3 results) No results for input(s): PROBNP in the last 8760 hours.  CBG: No results for input(s): GLUCAP in the last 168 hours.  Recent Results (from the past 240 hour(s))  Urine culture     Status: None (Preliminary result)   Collection Time: 11/04/14  9:39 AM  Result Value Ref Range Status   Specimen Description URINE, CATHETERIZED  Final   Special Requests NONE  Final   Culture PENDING  Incomplete   Report Status PENDING  Incomplete     Studies: Dg Chest 2 View  11/05/2014   CLINICAL DATA:  Sepsis.  EXAM: CHEST  2 VIEW  COMPARISON:  11/04/2014.  09/08/2014.  FINDINGS: Mediastinum and hilar  structures are normal. Heart size stable. Left base subsegmental atelectasis and or mild infiltrate. Small left pleural effusion. Elevation of the left hemidiaphragm. No pneumothorax. No acute bony abnormality.  IMPRESSION: 1. Left lower lobe subsegmental atelectasis and or mild infiltrate. Small left pleural effusion. 2. Elevation of the left hemidiaphragm.   Electronically Signed   By: Marcello Moores  Register   On: 11/05/2014 08:09   Dg Chest 2 View  11/04/2014   CLINICAL DATA:  Shortness of breath. Currently undergoing  radiation therapy for prostate cancer.  EXAM: CHEST  2 VIEW  COMPARISON:  09/08/2014 and 02/16/2014  FINDINGS: The heart size and mediastinal contours are within normal limits. Both lungs are clear. Previous resection of the distal right clavicle. Arthritic changes of the right shoulder.  IMPRESSION: No active cardiopulmonary disease.   Electronically Signed   By: Lorriane Shire M.D.   On: 11/04/2014 08:47    Scheduled Meds: . carbidopa-levodopa  1 tablet Oral TID  . citalopram  10 mg Oral Daily  . cycloSPORINE  1 drop Both Eyes BID  . fesoterodine  4 mg Oral Daily  . fluticasone  2 spray Each Nare Daily  . gabapentin  200 mg Oral TID  . guaiFENesin  1,200 mg Oral BID  . heparin  5,000 Units Subcutaneous 3 times per day  . hydrocortisone   Topical BID  . ipratropium-albuterol  3 mL Nebulization Q6H  . methylPREDNISolone (SOLU-MEDROL) injection  60 mg Intravenous Q6H  . metoprolol tartrate  25 mg Oral BID  . piperacillin-tazobactam (ZOSYN)  IV  3.375 g Intravenous 3 times per day  . sodium chloride  3 mL Intravenous Q12H  . tamsulosin  0.4 mg Oral Daily  . vancomycin  1,000 mg Intravenous Q24H   Continuous Infusions:  Antibiotics Given (last 72 hours)    Date/Time Action Medication Dose Rate   11/04/14 1738 Given   piperacillin-tazobactam (ZOSYN) IVPB 3.375 g 3.375 g 12.5 mL/hr   11/04/14 2351 Given   piperacillin-tazobactam (ZOSYN) IVPB 3.375 g 3.375 g 12.5 mL/hr   11/05/14 0830 Given   piperacillin-tazobactam (ZOSYN) IVPB 3.375 g 3.375 g 12.5 mL/hr   11/05/14 0947 Given   vancomycin (VANCOCIN) IVPB 1000 mg/200 mL premix 1,000 mg 200 mL/hr      Principal Problem:   Sepsis- suspecting PNA Active Problems:   Hyperlipidemia   Thrombocytopenia   Essential hypertension   Chronic diastolic heart failure   Anemia   Dehydration   Hyperkalemia   Chronic low back pain   PD (Parkinson's disease) diagnosed 2015, and follwos with Dr. Alfonso Patten. Tat    GERD (gastroesophageal reflux  disease)   Malignant neoplasm of prostate   Acute renal failure superimposed on stage 3 chronic kidney disease    Time spent: 29min    Catharina Pica  [facility] Pager [phone]. If 7PM-7AM, please contact night-coverage at [email or web address], password [facility] 11/05/2014, 11:10 AM  LOS: 1 day

## 2014-11-05 NOTE — Care Management Note (Signed)
Case Management Note  Patient Details  Name: CHANCE MUNTER MRN: [id number] Date of Birth: 24-Nov-1940  Subjective/Objective:    Patient is from home, await pt/ot eval.  NCM will cont to follow for dc needs.                Action/Plan:   Expected Discharge Date:                  Expected Discharge Plan:  Home/Self Care  In-House Referral:     Discharge planning Services  CM Consult  Post Acute Care Choice:    Choice offered to:     DME Arranged:    DME Agency:     HH Arranged:    HH Agency:     Status of Service:  In process, will continue to follow  Medicare Important Message Given:    Date Medicare IM Given:    Medicare IM give by:    Date Additional Medicare IM Given:    Additional Medicare Important Message give by:     If discussed at [place] of Stay Meetings, dates discussed:    Additional Comments:  Zenon Mayo, RN 11/05/2014, 12:01 PM

## 2014-11-05 NOTE — Progress Notes (Signed)
Patient very agitated and wanted to leave AMA, stating he not comfortable in hospital bed, in pain a lot when he moves. Patient called his wife to pick him up. This RN talked to patient's wife on the phone regarding patient request. Patient's wife stated she would not come and pick him up if he is not able to walk. Patient is very weak, unsteady, and not able to stand up on his own. RN convinced patient to stay. Patient agreed to stay until his wife comes in the morning. Also request pain medication. PRN Morphine 15 mg PO given.

## 2014-11-05 NOTE — Evaluation (Signed)
Physical Therapy Evaluation Patient Details Name: Gerald Gomez MRN: [id number] DOB: May 27, 1940 Today's Date: 11/05/2014   History of Present Illness  Pt adm with sepsis likely due to PNA. PMH - prostate CA with current radiation treatment, COPD, chronic diastolic CHF, chronic low back pain, anxiety & depression, hepatitis C, HLD, HTN  Clinical Impression  Pt admitted with above diagnosis and presents to PT with functional limitations due to deficits listed below (See PT problem list). Pt needs skilled PT to maximize independence and safety to allow discharge to SNF prior to return home.      Follow Up Recommendations SNF    Equipment Recommendations  None recommended by PT    Recommendations for Other Services       Precautions / Restrictions Precautions Precautions: Fall      Mobility  Bed Mobility Overal bed mobility: Needs Assistance Bed Mobility: Supine to Sit     Supine to sit: +2 for physical assistance;Mod assist     General bed mobility comments: Assist to move legs off bed and to elevate trunk.  Transfers Overall transfer level: Needs assistance   Transfers: Sit to/from Stand;Stand Pivot Transfers Sit to Stand: +2 physical assistance;Mod assist Stand pivot transfers: +2 physical assistance;Mod assist       General transfer comment: Assist to bring hips up and to make pivotal steps  Ambulation/Gait Ambulation/Gait assistance: +2 physical assistance;Mod assist Ambulation Distance (Feet): 2 Feet Assistive device: Rolling walker (2 wheeled) Gait Pattern/deviations: Step-to pattern;Decreased step length - right;Decreased step length - left;Steppage;Trunk flexed   Gait velocity interpretation: Below normal speed for age/gender General Gait Details: Pivotal steps over to chair. Assist for support.  Stairs            Wheelchair Mobility    Modified Rankin (Stroke Patients Only)       Balance Overall balance assessment: Needs  assistance Sitting-balance support: Bilateral upper extremity supported;Feet supported Sitting balance-Leahy Scale: Poor Sitting balance - Comments: supporting self with walker   Standing balance support: Bilateral upper extremity supported Standing balance-Leahy Scale: Poor Standing balance comment: walker and 2 person assist                             Pertinent Vitals/Pain Pain Assessment: Faces Faces Pain Scale: Hurts even more Pain Location: back Pain Descriptors / Indicators: Grimacing Pain Intervention(s): Limited activity within patient's tolerance;Monitored during session;Repositioned    Home Living Family/patient expects to be discharged to:: Private residence Living Arrangements: Spouse/significant other Available Help at Discharge: Family;Available 24 hours/day Type of Home: House Home Access: Stairs to enter Entrance Stairs-Rails: None Entrance Stairs-Number of Steps: 2 Home Layout: One level Home Equipment: Walker - 2 wheels;Cane - single point Additional Comments: sleeps in recliner at home    Prior Function Level of Independence: Independent with assistive device(s)         Comments: Amb with walker or cane     Hand Dominance        Extremity/Trunk Assessment   Upper Extremity Assessment: Defer to OT evaluation           Lower Extremity Assessment: Generalized weakness         Communication   Communication: No difficulties  Cognition Arousal/Alertness: Awake/alert Behavior During Therapy: WFL for tasks assessed/performed Overall Cognitive Status: Impaired/Different from baseline                 General Comments: Pt has history of intermittent confusion. currently pt  appropriate.    General Comments      Exercises        Assessment/Plan    PT Assessment Patient needs continued PT services  PT Diagnosis Difficulty walking;Abnormality of gait;Generalized weakness   PT Problem List Decreased strength;Decreased  activity tolerance;Decreased balance;Decreased mobility;Decreased cognition  PT Treatment Interventions DME instruction;Gait training;Functional mobility training;Therapeutic activities;Therapeutic exercise;Balance training;Patient/family education   PT Goals (Current goals can be found in the Care Plan section) Acute Rehab PT Goals Patient Stated Goal: return home PT Goal Formulation: With patient/family Time For Goal Achievement: 11/12/14 Potential to Achieve Goals: Good    Frequency Min 3X/week   Barriers to discharge        Co-evaluation               End of Session Equipment Utilized During Treatment: Gait belt Activity Tolerance: Patient limited by fatigue Patient left: in chair;with call bell/phone within reach;with chair alarm set;with family/visitor present Nurse Communication: Mobility status         Time: 1044-1101 PT Time Calculation (min) (ACUTE ONLY): 17 min   Charges:   PT Evaluation $Initial PT Evaluation Tier I: 1 Procedure     PT G Codes:        Ndia Sampath November 13, 2014, 12:06 PM  [employer] PT [phone]

## 2014-11-05 NOTE — Telephone Encounter (Signed)
Received call from patient's wife, Cathleen. She called to cancel radiation treatment today. Patient has been admitted to the hospital for fever and SOB. Will access patient status later today. Informed Raquel Sarna, RT on L1 and Dr. [name]  of these findings.

## 2014-11-05 NOTE — Progress Notes (Signed)
Patient took his Telemonitor leads off, refused to put it back on. Patient A&O x4. Schorr MD paged and notified.

## 2014-11-05 NOTE — Progress Notes (Signed)
OT Cancellation Note  Patient Details Name: Gerald Gomez MRN: [id number] DOB: [birth year]   Cancelled Treatment:    Reason Eval/Treat Not Completed: Patient declined, no reason specified (declined for lunch x2 attempts after lunch declined again) Pt states "i am comfortable."  "i dont need help" OT calling RN and Tech to notify that patient remains in chair since 10 AM and risk for skin break down but refusing sit<>stand or chair<>bed transfer.  Vonita Moss   OTR/L Pager: [phone] Office: [phone] .  11/05/2014, 2:37 PM

## 2014-11-05 NOTE — Progress Notes (Signed)
Utilization review completed. Jathniel Smeltzer, RN, BSN. 

## 2014-11-06 ENCOUNTER — Ambulatory Visit: Payer: Medicare Other

## 2014-11-06 DIAGNOSIS — A419 Sepsis, unspecified organism: Secondary | ICD-10-CM

## 2014-11-06 DIAGNOSIS — M545 Low back pain: Secondary | ICD-10-CM

## 2014-11-06 DIAGNOSIS — G8929 Other chronic pain: Secondary | ICD-10-CM

## 2014-11-06 LAB — BASIC METABOLIC PANEL
ANION GAP: 7 (ref 5–15)
BUN: 62 mg/dL — ABNORMAL HIGH (ref 6–20)
CALCIUM: 8.9 mg/dL (ref 8.9–10.3)
CHLORIDE: 111 mmol/L (ref 101–111)
CO2: 17 mmol/L — AB (ref 22–32)
CREATININE: 2.72 mg/dL — AB (ref 0.61–1.24)
GFR, EST AFRICAN AMERICAN: 25 mL/min — AB (ref >60)
GFR, EST NON AFRICAN AMERICAN: 21 mL/min — AB (ref >60)
Glucose, Bld: 142 mg/dL — ABNORMAL HIGH (ref 65–99)
Potassium: 4.8 mmol/L (ref 3.5–5.1)
SODIUM: 135 mmol/L (ref 135–145)

## 2014-11-06 LAB — CBC
HEMATOCRIT: 25.3 % — AB (ref 39.0–52.0)
Hemoglobin: 7.9 g/dL — ABNORMAL LOW (ref 13.0–17.0)
MCH: 26.6 pg (ref 26.0–34.0)
MCHC: 31.2 g/dL (ref 30.0–36.0)
MCV: 85.2 fL (ref 78.0–100.0)
Platelets: 88 10*3/uL — ABNORMAL LOW (ref 150–400)
RBC: 2.97 MIL/uL — AB (ref 4.22–5.81)
RDW: 14.6 % (ref 11.5–15.5)
WBC: 8.9 10*3/uL (ref 4.0–10.5)

## 2014-11-06 MED ORDER — IPRATROPIUM-ALBUTEROL 0.5-2.5 (3) MG/3ML IN SOLN
3.0000 mL | Freq: Two times a day (BID) | RESPIRATORY_TRACT | Status: DC
  Administered 2014-11-06 – 2014-11-07 (×2): 3 mL via RESPIRATORY_TRACT
  Filled 2014-11-06 (×2): qty 3

## 2014-11-06 MED ORDER — LEVOFLOXACIN 500 MG PO TABS
750.0000 mg | ORAL_TABLET | ORAL | Status: DC
Start: 2014-11-06 — End: 2014-11-07
  Administered 2014-11-06: 750 mg via ORAL
  Filled 2014-11-06: qty 2

## 2014-11-06 MED ORDER — METHYLPREDNISOLONE SODIUM SUCC 125 MG IJ SOLR
60.0000 mg | Freq: Two times a day (BID) | INTRAMUSCULAR | Status: DC
  Administered 2014-11-07: 60 mg via INTRAVENOUS
  Filled 2014-11-06: qty 2

## 2014-11-06 NOTE — Progress Notes (Signed)
OT Cancellation Note  Patient Details Name: Gerald Gomez MRN: [id number] DOB: 28-Jan-1941   Cancelled Treatment:      Pt is Medicare and current D/C plan is SNF. No apparent immediate acute care OT needs, therefore will defer OT to SNF. If OT eval is needed please call Acute Rehab Dept. at [phone] or text page OT at [phone].    Almon Register [phone] 11/06/2014, 7:30 AM

## 2014-11-06 NOTE — Progress Notes (Signed)
[facility] PROGRESS NOTE  EPHRIAM TURMAN JHE:[id number] DOB: 1940-12-18 DOA: 11/04/2014 PCP: [name], MD  Assessment/Plan: Bronchitis/COPD exacerbation -diffuse wheezing, cough and fever -cut down IV solumedrol, continue abx, add duonebs -DC VQ scan, clinical picture not c/w PE -atelectasis vs small infiltrate on repeat CXR -stopped Vancomycin, stop Zosyn, change to PO levaquin today  Acute on stage III chronic kidney disease:  -Baseline creatinine in the 2.1-2.4 range, creatinine was 2.75 -improving with hydration, continue IVF today -stopped NSAIDs, good urine output  Mild hyperkalemia: Secondary to acute kidney injury and potassium supplements -improved    Prostate cancer: Undergoing radiation treatment outpatient -resume XRt Monday  Normocytic anemia, chronic: Stable.  Chronic thrombocytopenia: Stable. Follow CBCs  Essential hypertension: Reasonably controlled. Continue home medications.  Chronic diastolic CHF: stable.  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, chronic  Code Status: Full Code Family Communication: wife and daughetr at bedside Disposition Plan: home in 1-2days, vs SNF  HPI/Subjective: Breathing better  Objective: Filed Vitals:   11/06/14 1318  BP: 155/74  Pulse: 85  Temp: 98.6 F (37 C)  Resp: 18    Intake/Output Summary (Last 24 hours) at 11/06/14 1355 Last data filed at 11/06/14 1315  Gross per 24 hour  Intake    470 ml  Output   1500 ml  Net  -1030 ml   Filed Weights   11/04/14 1530 11/05/14 0510 11/06/14 0522  Weight: 97.251 kg (214 lb 6.4 oz) 98 kg (216 lb 0.8 oz) 98.5 kg (217 lb 2.5 oz)    Exam:   General:  AAOx3, no distress  Cardiovascular: S1S2/RRR  Respiratory: less wheezes and ronchi  Abdomen: soft, Nt, BS present  Musculoskeletal: no edema c/c  Data Reviewed: Basic Metabolic Panel:  Recent Labs Lab  11/04/14 0810 11/05/14 0620 11/05/14 1240 11/06/14 0657  NA 139 137 138 135  K 5.2* 5.4* 5.1 4.8  CL 110 112* 112* 111  CO2 18* 17* 19* 17*  GLUCOSE 117* 126* 139* 142*  BUN 64* 59* 59* 62*  CREATININE 2.75* 2.51* 2.49* 2.72*  CALCIUM 9.3 8.9 9.1 8.9   Liver Function Tests:  Recent Labs Lab 11/04/14 0810  AST 54*  ALT 16*  ALKPHOS 78  BILITOT 0.5  PROT 8.0  ALBUMIN 3.0*   No results for input(s): LIPASE, AMYLASE in the last 168 hours. No results for input(s): AMMONIA in the last 168 hours. CBC:  Recent Labs Lab 11/04/14 0810 11/05/14 0620 11/06/14 0657  WBC 6.4 7.2 8.9  NEUTROABS 4.4  --   --   HGB 8.9* 7.5* 7.9*  HCT 29.0* 24.4* 25.3*  MCV 88.4 87.8 85.2  PLT 84* 71* 88*   Cardiac Enzymes:  Recent Labs Lab 11/04/14 0820  TROPONINI 0.03   BNP (last 3 results)  Recent Labs  11/04/14 0810  BNP 222.2*    ProBNP (last 3 results) No results for input(s): PROBNP in the last 8760 hours.  CBG: No results for input(s): GLUCAP in the last 168 hours.  Recent Results (from the past 240 hour(s))  Culture, blood (routine x 2)     Status: None (Preliminary result)   Collection Time: 11/04/14  8:10 AM  Result Value Ref Range Status   Specimen Description BLOOD LEFT ARM  Final   Special Requests BOTTLES DRAWN AEROBIC AND ANAEROBIC 10CC  Final   Culture NO GROWTH 1 DAY  Final   Report Status PENDING  Incomplete  Culture, blood (  routine x 2)     Status: None (Preliminary result)   Collection Time: 11/04/14  8:20 AM  Result Value Ref Range Status   Specimen Description BLOOD RIGHT ARM  Final   Special Requests BOTTLES DRAWN AEROBIC AND ANAEROBIC 10CC  Final   Culture NO GROWTH 1 DAY  Final   Report Status PENDING  Incomplete  Urine culture     Status: None   Collection Time: 11/04/14  9:39 AM  Result Value Ref Range Status   Specimen Description URINE, CATHETERIZED  Final   Special Requests NONE  Final   Culture NO GROWTH 1 DAY  Final   Report Status  11/05/2014 FINAL  Final     Studies: Dg Chest 2 View  11/05/2014   CLINICAL DATA:  Sepsis.  EXAM: CHEST  2 VIEW  COMPARISON:  11/04/2014.  09/08/2014.  FINDINGS: Mediastinum and hilar structures are normal. Heart size stable. Left base subsegmental atelectasis and or mild infiltrate. Small left pleural effusion. Elevation of the left hemidiaphragm. No pneumothorax. No acute bony abnormality.  IMPRESSION: 1. Left lower lobe subsegmental atelectasis and or mild infiltrate. Small left pleural effusion. 2. Elevation of the left hemidiaphragm.   Electronically Signed   By: Marcello Moores  Register   On: 11/05/2014 08:09    Scheduled Meds: . carbidopa-levodopa  1 tablet Oral TID  . citalopram  10 mg Oral Daily  . cycloSPORINE  1 drop Both Eyes BID  . fesoterodine  4 mg Oral Daily  . fluticasone  2 spray Each Nare Daily  . gabapentin  200 mg Oral TID  . guaiFENesin  1,200 mg Oral BID  . heparin  5,000 Units Subcutaneous 3 times per day  . hydrocortisone   Topical BID  . ipratropium-albuterol  3 mL Nebulization BID  . [START ON 11/07/2014] methylPREDNISolone (SOLU-MEDROL) injection  60 mg Intravenous Q12H  . metoprolol tartrate  25 mg Oral BID  . piperacillin-tazobactam (ZOSYN)  IV  3.375 g Intravenous 3 times per day  . sodium chloride  3 mL Intravenous Q12H  . tamsulosin  0.4 mg Oral Daily   Continuous Infusions:  Antibiotics Given (last 72 hours)    Date/Time Action Medication Dose Rate   11/04/14 1738 Given   piperacillin-tazobactam (ZOSYN) IVPB 3.375 g 3.375 g 12.5 mL/hr   11/04/14 2351 Given   piperacillin-tazobactam (ZOSYN) IVPB 3.375 g 3.375 g 12.5 mL/hr   11/05/14 0830 Given   piperacillin-tazobactam (ZOSYN) IVPB 3.375 g 3.375 g 12.5 mL/hr   11/05/14 0947 Given   vancomycin (VANCOCIN) IVPB 1000 mg/200 mL premix 1,000 mg 200 mL/hr   11/05/14 1631 Given   piperacillin-tazobactam (ZOSYN) IVPB 3.375 g 3.375 g 12.5 mL/hr   11/05/14 2330 Given   piperacillin-tazobactam (ZOSYN) IVPB 3.375 g  3.375 g 12.5 mL/hr   11/06/14 0830 Given   piperacillin-tazobactam (ZOSYN) IVPB 3.375 g 3.375 g 12.5 mL/hr      Principal Problem:   Sepsis- suspecting PNA Active Problems:   Hyperlipidemia   Thrombocytopenia   Essential hypertension   Chronic diastolic heart failure   Anemia   Dehydration   Hyperkalemia   Chronic low back pain   PD (Parkinson's disease) diagnosed 2015, and follwos with Dr. Alfonso Patten. Tat    GERD (gastroesophageal reflux disease)   Malignant neoplasm of prostate   Acute renal failure superimposed on stage 3 chronic kidney disease    Time spent: 45min    Nathaniel Wakeley  [facility] Pager [phone]. If 7PM-7AM, please contact night-coverage at [email or web address], password [facility] 11/06/2014,  1:55 PM  LOS: 2 days

## 2014-11-07 DIAGNOSIS — K219 Gastro-esophageal reflux disease without esophagitis: Secondary | ICD-10-CM

## 2014-11-07 LAB — BASIC METABOLIC PANEL
ANION GAP: 8 (ref 5–15)
BUN: 75 mg/dL — ABNORMAL HIGH (ref 6–20)
CALCIUM: 9 mg/dL (ref 8.9–10.3)
CO2: 17 mmol/L — ABNORMAL LOW (ref 22–32)
Chloride: 113 mmol/L — ABNORMAL HIGH (ref 101–111)
Creatinine, Ser: 2.62 mg/dL — ABNORMAL HIGH (ref 0.61–1.24)
GFR calc Af Amer: 26 mL/min — ABNORMAL LOW (ref >60)
GFR calc non Af Amer: 22 mL/min — ABNORMAL LOW (ref >60)
Glucose, Bld: 144 mg/dL — ABNORMAL HIGH (ref 65–99)
Potassium: 4.3 mmol/L (ref 3.5–5.1)
SODIUM: 138 mmol/L (ref 135–145)

## 2014-11-07 LAB — CBC
HCT: 25.9 % — ABNORMAL LOW (ref 39.0–52.0)
Hemoglobin: 8.2 g/dL — ABNORMAL LOW (ref 13.0–17.0)
MCH: 27.4 pg (ref 26.0–34.0)
MCHC: 31.7 g/dL (ref 30.0–36.0)
MCV: 86.6 fL (ref 78.0–100.0)
Platelets: 106 10*3/uL — ABNORMAL LOW (ref 150–400)
RBC: 2.99 MIL/uL — ABNORMAL LOW (ref 4.22–5.81)
RDW: 14.6 % (ref 11.5–15.5)
WBC: 11.1 10*3/uL — ABNORMAL HIGH (ref 4.0–10.5)

## 2014-11-07 MED ORDER — LEVOFLOXACIN 750 MG PO TABS: 750.0000 mg | ORAL_TABLET | ORAL | Status: DC

## 2014-11-07 MED ORDER — PREDNISONE 20 MG PO TABS: ORAL_TABLET | ORAL | Status: DC

## 2014-11-07 NOTE — Clinical Social Work Note (Signed)
Clinical Social Work Assessment  Patient Details  Name: Gerald Gomez MRN: [id number] Date of Birth: [birth year]  Date of referral:  11/05/14               Reason for consult:  Facility Placement                Permission sought to share information with:  Family Supports Permission granted to share information::  Yes, Verbal Permission Granted  Name::        Agency::     Relationship::     Contact Information:     Housing/Transportation Living arrangements for the past 2 months:  Single Family Home Source of Information:  Patient, Spouse Patient Interpreter Needed:  None Criminal Activity/Legal Involvement Pertinent to Current Situation/Hospitalization:  No - Comment as needed Significant Relationships:  Spouse Lives with:  Spouse Do you feel safe going back to the place where you live?  Yes Need for family participation in patient care:  Yes (Comment)  Care giving concerns: Patient was very clear that he did not want snf because he wants to focus on his cancer treatment. CSW explained very clearly that st rehab would not interfere with cancer treatment and patient and wife repeated understanding. Patient and wife agreed that they were comfortable with his level of mobility and use of his walker at home. Patient and wife also identified that they have a daughter that live nearby who can also provide support. Patient and wife agreed that they would seek a rehab facility if need be, but refused list of st rehab from csw.   Social Worker assessment / plan: CSW made RNCM aware and will sign off.   Employment status:  Retired Health visitor, Managed Care PT Recommendations:  [place] / Referral to community resources:     Patient/Family's Response to care: Family agrees with plan to discharge home with hh and family care.   Patient/Family's Understanding of and Emotional Response to Diagnosis, Current Treatment, and Prognosis: Family at  this time focused on returning to medical base line (managing arthritis better) and treating cancer. Rehab seems secondary despite CSW explanation of the importance of all treatment.   Emotional Assessment Appearance:  Appears stated age Attitude/Demeanor/Rapport:   (Pleasant but clear about his wishes.) Affect (typically observed):  Appropriate Orientation:  Oriented to Self, Oriented to Place, Oriented to  Time, Oriented to Situation Alcohol / Substance use:  Not Applicable Psych involvement (Current and /or in the community):  No (Comment)  Discharge Needs  Concerns to be addressed:  No discharge needs identified Readmission within the last 30 days:  No Current discharge risk:  None Barriers to Discharge:  No Barriers Identified  No further CSW needs. CSW signing off.  Christene Lye, LCSW 11/07/2014, 11:59 AM

## 2014-11-07 NOTE — Discharge Summary (Signed)
Physician Discharge Summary  Gerald Gomez FWY:[id number] DOB: [birth year] DOA: 11/04/2014  PCP: [name], MD  Admit date: 11/04/2014 Discharge date: 11/07/2014  Time spent: 45 minutes  Recommendations for Outpatient Follow-up:  1. PCP Dr.John in 1 week 2. Bmet in 1 week, needs to avoid NSAIDs for arthritis due to CKD 3. Radiation oncology on Monday 4. Dr.Ottelin in 2weeks  5. Declines SNF and Home Pt at this time, patient and wife to reconsider and contact PCP if needed  Discharge Diagnoses:    COPD Exacerbation   Bronchitis   Hyperlipidemia   Thrombocytopenia   Essential hypertension   Chronic diastolic heart failure   Anemia   Dehydration   Hyperkalemia   Chronic low back pain   PD (Parkinson's disease) diagnosed 2015, and follwos with Dr. Alfonso Patten. Tat    GERD (gastroesophageal reflux disease)   Malignant neoplasm of prostate   Acute renal failure superimposed on stage 3 chronic kidney disease   Discharge Condition: stable  Diet recommendation: low sodium  Filed Weights   11/05/14 0510 11/06/14 0522 11/07/14 0531  Weight: 98 kg (216 lb 0.8 oz) 98.5 kg (217 lb 2.5 oz) 98.7 kg (217 lb 9.5 oz)    History of present illness:  Chief Complaint: Cough, wheezing, dyspnea, weakness HPI: Gerald Gomez is a 74/M with PMH of prostate cancer with ongoing radiation treatment, not on chemotherapy, former heavy tobacco abuse, asthma/COPD, chronic diastolic CHF, chronic low back pain, anxiety & depression, hepatitis C, HLD, HTN, stage III chronic kidney disease, chronic anemia and thrombocytopenia, presented to [facility] ED on 11/04/14 with above complaints.  History approximately 1 month history of cough with intermittent "black" sputum, intermittent dyspnea associated with wheezing and uses her inhaler and told to have asthma.  Spouse also indicates that patient has had chronic intermittent confusion for a long time. In the ED, febrile at 101F, other vital signs stable, lactate normal, potassium  5.2, BUN 64, creatinine 2.75, hemoglobin 8.9, platelets 84 and chest x-ray negative  Hospital Course:  Bronchitis/COPD exacerbation -diffuse wheezing, cough and fever on admission -treated with IV solumedrol, continue abx, duonebs -atelectasis vs small infiltrate on repeat CXR -improved, changed to PO levaquin 8/13  Acute on stage III-4 chronic kidney disease:  -Baseline creatinine in the 2.1-2.4 range, creatinine was 2.75 -no change with hydration, creatinine stable in 2.6 to 2.7 range -stopped NSAIDs, advised pt to refrain from Ibuprofen, unfortunately he reports that this is the only medicine that helps with his arthritis  -FU with Dr.Goldsborough  Mild hyperkalemia: Secondary to acute kidney injury and potassium supplements -improved, KCL stopped   Prostate cancer: Undergoing radiation treatment outpatient -resume XRt Monday  Normocytic anemia, chronic: Stable.  Chronic thrombocytopenia: Stable. Follow CBCs  Essential hypertension: Reasonably controlled. Continue home medications.  Chronic diastolic CHF: stable.  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 For 4days Qty: 2 tablet, Refills: 0    predniSONE (DELTASONE) 20 MG tablet Take 40mg  for 2days then 20mg  for 2days then STOP Qty: 6 tablet, Refills: 0       CONTINUE these medications which have NOT CHANGED   Details  albuterol (PROVENTIL HFA;VENTOLIN HFA) 108 (90 BASE) MCG/ACT inhaler Inhale 2 puffs into the lungs every 6 (six) hours as needed for wheezing or shortness of breath. Dispense Ventolin HFA please Qty: 1 Inhaler, Refills: 11    carbidopa-levodopa (SINEMET IR) 25-100 MG per tablet Take 1 tablet by mouth 3 (three) times daily. Qty: 90 tablet, Refills: 5    citalopram (CELEXA) 10 MG tablet take 1 tablet by mouth once daily Qty: 90 tablet, Refills: 2    cycloSPORINE (RESTASIS) 0.05 % ophthalmic emulsion Place 1 drop into both eyes 2 (two) times daily.    Diclofenac Sodium 2 % SOLN Apply 1 pump twice daily. Qty: 112 g, Refills: 3    fluticasone (FLONASE) 50 MCG/ACT nasal spray Place 2 sprays into both nostrils daily.    furosemide (LASIX) 40 MG tablet Take 40 mg by mouth daily.  Refills: 0    gabapentin (NEURONTIN) 100 MG capsule Take 1 capsule (100 mg total) by mouth 3 (three) times daily. Qty: 90 capsule, Refills: 0    hydrocortisone 2.5 % ointment Apply to affected area BID. Qty: 30 g, Refills: 3    LORazepam (ATIVAN) 1 MG tablet Take 1 tablet (1 mg total) by mouth every 8 (eight) hours as needed for anxiety (30 min before radiation). Qty: 45 tablet, Refills: 0   Associated Diagnoses: Malignant neoplasm of prostate    metoprolol tartrate (LOPRESSOR) 25 MG tablet take 1 tablet by mouth twice a day Qty: 180 tablet, Refills: 3    morphine (MSIR) 15 MG tablet Take 15 mg by mouth every 4 (four) hours as needed for severe pain. And 30 mins before radiation.    prochlorperazine (COMPAZINE) 10 MG tablet Take 1 tablet (10 mg total) by mouth every 6 (six) hours as needed for nausea or vomiting. Qty: 45 tablet, Refills: 3   Associated Diagnoses: Malignant neoplasm of prostate    tamsulosin (FLOMAX) 0.4 MG CAPS capsule take 1 capsule by mouth once daily Qty: 90 capsule, Refills: 3    TOVIAZ 4 MG TB24 tablet Take 4 mg by mouth  daily. Refills: 0      STOP taking these medications     ibuprofen (ADVIL,MOTRIN) 800 MG tablet      potassium chloride (K-DUR,KLOR-CON) 10 MEQ tablet        Allergies  Allergen Reactions  . Tramadol Nausea Only   Follow-up Information    Follow up with [name], MD. Schedule an appointment as soon as possible for a visit in 1 week.   Specialties:  Internal Medicine, Radiology   Contact information:   [place] [address] [phone]        The results of significant diagnostics from this hospitalization (including imaging, microbiology, ancillary and laboratory) are listed below for reference.    Significant Diagnostic Studies: Dg Chest 2 View  11/05/2014   CLINICAL DATA:  Sepsis.  EXAM: CHEST  2 VIEW  COMPARISON:  11/04/2014.  09/08/2014.  FINDINGS: Mediastinum and hilar structures are normal. Heart size stable. Left base subsegmental atelectasis and or mild infiltrate. Small left pleural effusion. Elevation of the left hemidiaphragm.  No pneumothorax. No acute bony abnormality.  IMPRESSION: 1. Left lower lobe subsegmental atelectasis and or mild infiltrate. Small left pleural effusion. 2. Elevation of the left hemidiaphragm.   Electronically Signed   By: Marcello Moores  Register   On: 11/05/2014 08:09   Dg Chest 2 View  11/04/2014   CLINICAL DATA:  Shortness of breath. Currently undergoing radiation therapy for prostate cancer.  EXAM: CHEST  2 VIEW  COMPARISON:  09/08/2014 and 02/16/2014  FINDINGS: The heart size and mediastinal contours are within normal limits. Both lungs are clear. Previous resection of the distal right clavicle. Arthritic changes of the right shoulder.  IMPRESSION: No active cardiopulmonary disease.   Electronically Signed   By: Lorriane Shire M.D.   On: 11/04/2014 08:47    Microbiology: Recent Results (from the past 240 hour(s))  Culture, blood (routine x 2)     Status: None (Preliminary result)   Collection Time: 11/04/14  8:10 AM   Result Value Ref Range Status   Specimen Description BLOOD LEFT ARM  Final   Special Requests BOTTLES DRAWN AEROBIC AND ANAEROBIC 10CC  Final   Culture NO GROWTH 2 DAYS  Final   Report Status PENDING  Incomplete  Culture, blood (routine x 2)     Status: None (Preliminary result)   Collection Time: 11/04/14  8:20 AM  Result Value Ref Range Status   Specimen Description BLOOD RIGHT ARM  Final   Special Requests BOTTLES DRAWN AEROBIC AND ANAEROBIC 10CC  Final   Culture NO GROWTH 2 DAYS  Final   Report Status PENDING  Incomplete  Urine culture     Status: None   Collection Time: 11/04/14  9:39 AM  Result Value Ref Range Status   Specimen Description URINE, CATHETERIZED  Final   Special Requests NONE  Final   Culture NO GROWTH 1 DAY  Final   Report Status 11/05/2014 FINAL  Final     Labs: Basic Metabolic Panel:  Recent Labs Lab 11/04/14 0810 11/05/14 0620 11/05/14 1240 11/06/14 0657 11/07/14 0711  NA 139 137 138 135 138  K 5.2* 5.4* 5.1 4.8 4.3  CL 110 112* 112* 111 113*  CO2 18* 17* 19* 17* 17*  GLUCOSE 117* 126* 139* 142* 144*  BUN 64* 59* 59* 62* 75*  CREATININE 2.75* 2.51* 2.49* 2.72* 2.62*  CALCIUM 9.3 8.9 9.1 8.9 9.0   Liver Function Tests:  Recent Labs Lab 11/04/14 0810  AST 54*  ALT 16*  ALKPHOS 78  BILITOT 0.5  PROT 8.0  ALBUMIN 3.0*   No results for input(s): LIPASE, AMYLASE in the last 168 hours. No results for input(s): AMMONIA in the last 168 hours. CBC:  Recent Labs Lab 11/04/14 0810 11/05/14 0620 11/06/14 0657 11/07/14 0711  WBC 6.4 7.2 8.9 11.1*  NEUTROABS 4.4  --   --   --   HGB 8.9* 7.5* 7.9* 8.2*  HCT 29.0* 24.4* 25.3* 25.9*  MCV 88.4 87.8 85.2 86.6  PLT 84* 71* 88* 106*   Cardiac Enzymes:  Recent Labs Lab 11/04/14 0820  TROPONINI 0.03   BNP: BNP (last 3 results)  Recent Labs  11/04/14 0810  BNP 222.2*    ProBNP (last 3 results) No results for input(s): PROBNP in the last 8760 hours.  CBG: No results for  input(s): GLUCAP in the last 168 hours.     SignedDomenic Polite  [facility] 11/07/2014, 11:19 AM

## 2014-11-07 NOTE — Progress Notes (Signed)
Patient discharge teaching given, including activity, diet, follow-up appoints, and medications. Patient and wife verbalized understanding of all discharge instructions. IV access was d/c'd. Vitals are stable. Skin is intact except as charted in most recent assessments. Pt to be escorted out by NT, to be driven home by wife.

## 2014-11-08 ENCOUNTER — Ambulatory Visit: Payer: Medicare Other

## 2014-11-08 ENCOUNTER — Telehealth: Payer: Self-pay | Admitting: *Deleted

## 2014-11-08 ENCOUNTER — Telehealth: Payer: Self-pay | Admitting: Radiation Oncology

## 2014-11-08 NOTE — Telephone Encounter (Signed)
Patient's wife, Kevan Ny, phoned to cancel treatment appointments for him today and tomorrow. She reports he is "too weak" to come in. She states, "we will try again Wednesday." Informed Eve, RT on L1 of this finding.

## 2014-11-08 NOTE — Telephone Encounter (Signed)
Transition Care Management Follow-up Telephone Call  Date discharged? 11/07/14  How have you been since you were released from the hospital? Spoke with pt wife she states he is doing all right still a little weak   Do you understand why you were in the hospital? YES   Do you understand the discharge instructions? YES   Where were you discharged to? Home   Items Reviewed:  Medications reviewed: YES  Allergies reviewed: YES  Dietary changes reviewed: YES, low sodieum  Referrals reviewed: No referral needed since pt decline SNF, Home PT  Functional Questionnaire:   Activities of Daily Living (ADLs):   She states he are independent in the following: bathing and hygiene, feeding, continence, grooming, toileting and dressing States he require assistance with the following: ambulation   Any transportation issues/concerns?: NO   Any patient concerns? NO   Confirmed importance and date/time of follow-up visits scheduled YES, wife requested appt be made on 11/17/14 due to radiation appt   Provider Appointment booked with Dr. Jenny Reichmann  Confirmed with patient if condition begins to worsen call PCP or go to the ER.  Patient was given the office number and encouraged to call back with question or concerns.  : YES

## 2014-11-09 ENCOUNTER — Ambulatory Visit: Payer: Medicare Other

## 2014-11-09 LAB — CULTURE, BLOOD (ROUTINE X 2)
Culture: NO GROWTH
Culture: NO GROWTH

## 2014-11-10 ENCOUNTER — Telehealth: Payer: Self-pay | Admitting: Radiation Oncology

## 2014-11-10 ENCOUNTER — Ambulatory Visit: Payer: Medicare Other

## 2014-11-10 NOTE — Telephone Encounter (Signed)
Patient's wife phoned to cancel her husband's radiation treatments for today, tomorrow and Friday. She reports her husband continues to feel weak with diarrhea. She confirms he is taking Imodium for the diarrhea. Informed Katie, RT of this finding. Requested Katie, RT phone the patient's wife at home to discuss treatment times for next week since he has several other doctors appointments.

## 2014-11-11 ENCOUNTER — Ambulatory Visit: Payer: Medicare Other

## 2014-11-12 ENCOUNTER — Ambulatory Visit: Payer: Medicare Other

## 2014-11-12 ENCOUNTER — Ambulatory Visit
Admission: RE | Admit: 2014-11-12 | Discharge: 2014-11-12 | Disposition: A | Discharge status: Home / Self Care | Payer: Medicare Other | Source: Ambulatory Visit | Attending: Radiation Oncology | Admitting: Radiation Oncology

## 2014-11-12 DIAGNOSIS — C61 Malignant neoplasm of prostate: Secondary | ICD-10-CM

## 2014-11-13 ENCOUNTER — Encounter (HOSPITAL_COMMUNITY): Payer: Self-pay | Admitting: Emergency Medicine

## 2014-11-13 ENCOUNTER — Emergency Department (HOSPITAL_COMMUNITY): Payer: Medicare Other

## 2014-11-13 ENCOUNTER — Inpatient Hospital Stay (HOSPITAL_COMMUNITY)
Admission: EM | Admit: 2014-11-13 | Discharge: 2014-11-16 | DRG: 189 | Discharge status: Against Medical Advice | Payer: Medicare Other | Attending: Internal Medicine | Admitting: Internal Medicine

## 2014-11-13 ENCOUNTER — Ambulatory Visit: Payer: Medicare Other

## 2014-11-13 DIAGNOSIS — Z885 Allergy status to narcotic agent status: Secondary | ICD-10-CM

## 2014-11-13 DIAGNOSIS — J45909 Unspecified asthma, uncomplicated: Secondary | ICD-10-CM | POA: Diagnosis present

## 2014-11-13 DIAGNOSIS — E785 Hyperlipidemia, unspecified: Secondary | ICD-10-CM | POA: Diagnosis present

## 2014-11-13 DIAGNOSIS — Z8619 Personal history of other infectious and parasitic diseases: Secondary | ICD-10-CM

## 2014-11-13 DIAGNOSIS — K219 Gastro-esophageal reflux disease without esophagitis: Secondary | ICD-10-CM | POA: Diagnosis present

## 2014-11-13 DIAGNOSIS — M545 Low back pain: Secondary | ICD-10-CM | POA: Diagnosis present

## 2014-11-13 DIAGNOSIS — I110 Hypertensive heart disease with heart failure: Secondary | ICD-10-CM | POA: Diagnosis present

## 2014-11-13 DIAGNOSIS — G4730 Sleep apnea, unspecified: Secondary | ICD-10-CM | POA: Diagnosis present

## 2014-11-13 DIAGNOSIS — I5032 Chronic diastolic (congestive) heart failure: Secondary | ICD-10-CM | POA: Diagnosis present

## 2014-11-13 DIAGNOSIS — J9600 Acute respiratory failure, unspecified whether with hypoxia or hypercapnia: Secondary | ICD-10-CM | POA: Diagnosis present

## 2014-11-13 DIAGNOSIS — F419 Anxiety disorder, unspecified: Secondary | ICD-10-CM | POA: Diagnosis present

## 2014-11-13 DIAGNOSIS — T445X5A Adverse effect of predominantly beta-adrenoreceptor agonists, initial encounter: Secondary | ICD-10-CM | POA: Diagnosis not present

## 2014-11-13 DIAGNOSIS — M1990 Unspecified osteoarthritis, unspecified site: Secondary | ICD-10-CM | POA: Diagnosis present

## 2014-11-13 DIAGNOSIS — I5031 Acute diastolic (congestive) heart failure: Secondary | ICD-10-CM | POA: Diagnosis not present

## 2014-11-13 DIAGNOSIS — Z87891 Personal history of nicotine dependence: Secondary | ICD-10-CM | POA: Diagnosis not present

## 2014-11-13 DIAGNOSIS — D649 Anemia, unspecified: Secondary | ICD-10-CM | POA: Diagnosis present

## 2014-11-13 DIAGNOSIS — G894 Chronic pain syndrome: Secondary | ICD-10-CM | POA: Diagnosis present

## 2014-11-13 DIAGNOSIS — E872 Acidosis, unspecified: Secondary | ICD-10-CM | POA: Diagnosis present

## 2014-11-13 DIAGNOSIS — I10 Essential (primary) hypertension: Secondary | ICD-10-CM

## 2014-11-13 DIAGNOSIS — F329 Major depressive disorder, single episode, unspecified: Secondary | ICD-10-CM | POA: Diagnosis present

## 2014-11-13 DIAGNOSIS — I4891 Unspecified atrial fibrillation: Secondary | ICD-10-CM | POA: Diagnosis present

## 2014-11-13 DIAGNOSIS — Z5321 Procedure and treatment not carried out due to patient leaving prior to being seen by health care provider: Secondary | ICD-10-CM | POA: Diagnosis present

## 2014-11-13 DIAGNOSIS — D696 Thrombocytopenia, unspecified: Secondary | ICD-10-CM | POA: Diagnosis present

## 2014-11-13 DIAGNOSIS — J449 Chronic obstructive pulmonary disease, unspecified: Secondary | ICD-10-CM | POA: Diagnosis present

## 2014-11-13 DIAGNOSIS — G20 Parkinson's disease: Secondary | ICD-10-CM | POA: Diagnosis present

## 2014-11-13 DIAGNOSIS — I129 Hypertensive chronic kidney disease with stage 1 through stage 4 chronic kidney disease, or unspecified chronic kidney disease: Secondary | ICD-10-CM | POA: Diagnosis present

## 2014-11-13 DIAGNOSIS — M519 Unspecified thoracic, thoracolumbar and lumbosacral intervertebral disc disorder: Secondary | ICD-10-CM | POA: Diagnosis present

## 2014-11-13 DIAGNOSIS — Z7951 Long term (current) use of inhaled steroids: Secondary | ICD-10-CM | POA: Diagnosis not present

## 2014-11-13 DIAGNOSIS — F1021 Alcohol dependence, in remission: Secondary | ICD-10-CM | POA: Diagnosis present

## 2014-11-13 DIAGNOSIS — C61 Malignant neoplasm of prostate: Secondary | ICD-10-CM | POA: Diagnosis present

## 2014-11-13 DIAGNOSIS — N184 Chronic kidney disease, stage 4 (severe): Secondary | ICD-10-CM | POA: Diagnosis present

## 2014-11-13 DIAGNOSIS — J441 Chronic obstructive pulmonary disease with (acute) exacerbation: Secondary | ICD-10-CM | POA: Diagnosis present

## 2014-11-13 DIAGNOSIS — N179 Acute kidney failure, unspecified: Secondary | ICD-10-CM | POA: Diagnosis present

## 2014-11-13 DIAGNOSIS — R5383 Other fatigue: Secondary | ICD-10-CM

## 2014-11-13 DIAGNOSIS — E874 Mixed disorder of acid-base balance: Secondary | ICD-10-CM | POA: Diagnosis not present

## 2014-11-13 DIAGNOSIS — N183 Chronic kidney disease, stage 3 unspecified: Secondary | ICD-10-CM | POA: Diagnosis present

## 2014-11-13 DIAGNOSIS — I471 Supraventricular tachycardia: Secondary | ICD-10-CM | POA: Diagnosis not present

## 2014-11-13 DIAGNOSIS — R0600 Dyspnea, unspecified: Secondary | ICD-10-CM | POA: Diagnosis not present

## 2014-11-13 DIAGNOSIS — Z79899 Other long term (current) drug therapy: Secondary | ICD-10-CM | POA: Diagnosis not present

## 2014-11-13 DIAGNOSIS — N529 Male erectile dysfunction, unspecified: Secondary | ICD-10-CM | POA: Diagnosis present

## 2014-11-13 DIAGNOSIS — Z79891 Long term (current) use of opiate analgesic: Secondary | ICD-10-CM

## 2014-11-13 DIAGNOSIS — J9601 Acute respiratory failure with hypoxia: Secondary | ICD-10-CM | POA: Diagnosis not present

## 2014-11-13 DIAGNOSIS — I739 Peripheral vascular disease, unspecified: Secondary | ICD-10-CM | POA: Diagnosis present

## 2014-11-13 DIAGNOSIS — R0602 Shortness of breath: Secondary | ICD-10-CM

## 2014-11-13 LAB — BLOOD GAS, ARTERIAL
Acid-base deficit: 15.2 mmol/L — ABNORMAL HIGH (ref 0.0–2.0)
Bicarbonate: 11.5 mEq/L — ABNORMAL LOW (ref 20.0–24.0)
DRAWN BY: 307971
FIO2: 0.21
O2 Saturation: 93.3 %
PH ART: 7.183 — AB (ref 7.350–7.450)
Patient temperature: 98.6
TCO2: 12.5 mmol/L (ref 0–100)
pCO2 arterial: 32 mmHg — ABNORMAL LOW (ref 35.0–45.0)
pO2, Arterial: 78.9 mmHg — ABNORMAL LOW (ref 80.0–100.0)

## 2014-11-13 LAB — CBC WITH DIFFERENTIAL/PLATELET
Basophils Absolute: 0 10*3/uL (ref 0.0–0.1)
Basophils Relative: 0 % (ref 0–1)
EOS ABS: 0.1 10*3/uL (ref 0.0–0.7)
Eosinophils Relative: 2 % (ref 0–5)
HEMATOCRIT: 26.4 % — AB (ref 39.0–52.0)
HEMOGLOBIN: 8.2 g/dL — AB (ref 13.0–17.0)
LYMPHS ABS: 0.9 10*3/uL (ref 0.7–4.0)
Lymphocytes Relative: 12 % (ref 12–46)
MCH: 26.5 pg (ref 26.0–34.0)
MCHC: 31.1 g/dL (ref 30.0–36.0)
MCV: 85.2 fL (ref 78.0–100.0)
MONO ABS: 1 10*3/uL (ref 0.1–1.0)
MONOS PCT: 13 % — AB (ref 3–12)
NEUTROS ABS: 5.9 10*3/uL (ref 1.7–7.7)
NEUTROS PCT: 73 % (ref 43–77)
Platelets: 130 10*3/uL — ABNORMAL LOW (ref 150–400)
RBC: 3.1 MIL/uL — ABNORMAL LOW (ref 4.22–5.81)
RDW: 15.6 % — AB (ref 11.5–15.5)
WBC: 8 10*3/uL (ref 4.0–10.5)

## 2014-11-13 LAB — I-STAT ARTERIAL BLOOD GAS, ED
ACID-BASE DEFICIT: 16 mmol/L — AB (ref 0.0–2.0)
Bicarbonate: 11.1 mEq/L — ABNORMAL LOW (ref 20.0–24.0)
O2 SAT: 87 %
PH ART: 7.165 — AB (ref 7.350–7.450)
PO2 ART: 66 mmHg — AB (ref 80.0–100.0)
TCO2: 12 mmol/L (ref 0–100)
pCO2 arterial: 30.8 mmHg — ABNORMAL LOW (ref 35.0–45.0)

## 2014-11-13 LAB — URINALYSIS, ROUTINE W REFLEX MICROSCOPIC
Bilirubin Urine: NEGATIVE
Glucose, UA: NEGATIVE mg/dL
Ketones, ur: NEGATIVE mg/dL
LEUKOCYTES UA: NEGATIVE
NITRITE: NEGATIVE
PH: 5 (ref 5.0–8.0)
Protein, ur: 30 mg/dL — AB
SPECIFIC GRAVITY, URINE: 1.012 (ref 1.005–1.030)
Urobilinogen, UA: 0.2 mg/dL (ref 0.0–1.0)

## 2014-11-13 LAB — CREATININE, SERUM
Creatinine, Ser: 2.85 mg/dL — ABNORMAL HIGH (ref 0.61–1.24)
GFR calc Af Amer: 23 mL/min — ABNORMAL LOW (ref >60)
GFR calc non Af Amer: 20 mL/min — ABNORMAL LOW (ref >60)

## 2014-11-13 LAB — TROPONIN I
TROPONIN I: 0.1 ng/mL — AB (ref <0.031)
TROPONIN I: 0.13 ng/mL — AB (ref <0.031)
TROPONIN I: 0.14 ng/mL — AB (ref <0.031)

## 2014-11-13 LAB — BASIC METABOLIC PANEL
Anion gap: 11 (ref 5–15)
BUN: 84 mg/dL — ABNORMAL HIGH (ref 6–20)
CHLORIDE: 119 mmol/L — AB (ref 101–111)
CO2: 12 mmol/L — AB (ref 22–32)
CREATININE: 2.93 mg/dL — AB (ref 0.61–1.24)
Calcium: 8.7 mg/dL — ABNORMAL LOW (ref 8.9–10.3)
GFR calc non Af Amer: 19 mL/min — ABNORMAL LOW (ref >60)
GFR, EST AFRICAN AMERICAN: 23 mL/min — AB (ref >60)
GLUCOSE: 128 mg/dL — AB (ref 65–99)
Potassium: 4.8 mmol/L (ref 3.5–5.1)
Sodium: 142 mmol/L (ref 135–145)

## 2014-11-13 LAB — CBC
HCT: 24.7 % — ABNORMAL LOW (ref 39.0–52.0)
Hemoglobin: 7.7 g/dL — ABNORMAL LOW (ref 13.0–17.0)
MCH: 26.6 pg (ref 26.0–34.0)
MCHC: 31.2 g/dL (ref 30.0–36.0)
MCV: 85.2 fL (ref 78.0–100.0)
PLATELETS: 133 10*3/uL — AB (ref 150–400)
RBC: 2.9 MIL/uL — ABNORMAL LOW (ref 4.22–5.81)
RDW: 15.5 % (ref 11.5–15.5)
WBC: 9.5 10*3/uL (ref 4.0–10.5)

## 2014-11-13 LAB — URINE MICROSCOPIC-ADD ON

## 2014-11-13 LAB — MRSA PCR SCREENING: MRSA BY PCR: NEGATIVE

## 2014-11-13 LAB — I-STAT TROPONIN, ED: Troponin i, poc: 0.09 ng/mL (ref 0.00–0.08)

## 2014-11-13 LAB — BRAIN NATRIURETIC PEPTIDE: B Natriuretic Peptide: 3091.7 pg/mL — ABNORMAL HIGH (ref 0.0–100.0)

## 2014-11-13 LAB — I-STAT CG4 LACTIC ACID, ED: Lactic Acid, Venous: 0.78 mmol/L (ref 0.5–2.0)

## 2014-11-13 MED ORDER — LORAZEPAM 1 MG PO TABS
1.0000 mg | ORAL_TABLET | Freq: Three times a day (TID) | ORAL | Status: DC | PRN
  Administered 2014-11-14 (×3): 1 mg via ORAL
  Filled 2014-11-13 (×3): qty 1

## 2014-11-13 MED ORDER — HYDROCODONE-ACETAMINOPHEN 5-325 MG PO TABS
1.0000 | ORAL_TABLET | Freq: Once | ORAL | Status: AC
  Administered 2014-11-13: 1 via ORAL
  Filled 2014-11-13: qty 1

## 2014-11-13 MED ORDER — MORPHINE SULFATE 15 MG PO TABS
15.0000 mg | ORAL_TABLET | ORAL | Status: DC | PRN
  Administered 2014-11-13 – 2014-11-15 (×3): 15 mg via ORAL
  Filled 2014-11-13 (×3): qty 1

## 2014-11-13 MED ORDER — ONDANSETRON HCL 4 MG/2ML IJ SOLN: 4.0000 mg | Freq: Four times a day (QID) | INTRAMUSCULAR | Status: DC | PRN

## 2014-11-13 MED ORDER — SODIUM CHLORIDE 0.9 % IV BOLUS (SEPSIS)
1000.0000 mL | Freq: Once | INTRAVENOUS | Status: AC
  Administered 2014-11-13: 1000 mL via INTRAVENOUS

## 2014-11-13 MED ORDER — IPRATROPIUM-ALBUTEROL 0.5-2.5 (3) MG/3ML IN SOLN
3.0000 mL | Freq: Four times a day (QID) | RESPIRATORY_TRACT | Status: DC
  Administered 2014-11-13 (×2): 3 mL via RESPIRATORY_TRACT
  Filled 2014-11-13 (×2): qty 3

## 2014-11-13 MED ORDER — METHYLPREDNISOLONE SODIUM SUCC 125 MG IJ SOLR
60.0000 mg | Freq: Four times a day (QID) | INTRAMUSCULAR | Status: DC
  Administered 2014-11-13 – 2014-11-14 (×4): 60 mg via INTRAVENOUS
  Filled 2014-11-13 (×4): qty 2

## 2014-11-13 MED ORDER — DILTIAZEM HCL ER COATED BEADS 240 MG PO CP24
240.0000 mg | ORAL_CAPSULE | Freq: Every day | ORAL | Status: DC
  Administered 2014-11-13 – 2014-11-16 (×4): 240 mg via ORAL
  Filled 2014-11-13 (×4): qty 1

## 2014-11-13 MED ORDER — SODIUM CHLORIDE 0.9 % IJ SOLN
3.0000 mL | Freq: Two times a day (BID) | INTRAMUSCULAR | Status: DC
  Administered 2014-11-15 (×2): 3 mL via INTRAVENOUS

## 2014-11-13 MED ORDER — ONDANSETRON HCL 4 MG PO TABS: 4.0000 mg | ORAL_TABLET | Freq: Four times a day (QID) | ORAL | Status: DC | PRN

## 2014-11-13 MED ORDER — CARBIDOPA-LEVODOPA 25-100 MG PO TABS
1.0000 | ORAL_TABLET | Freq: Three times a day (TID) | ORAL | Status: DC
  Administered 2014-11-13 – 2014-11-16 (×8): 1 via ORAL
  Filled 2014-11-13 (×8): qty 1

## 2014-11-13 MED ORDER — ACETAMINOPHEN 650 MG RE SUPP: 650.0000 mg | Freq: Four times a day (QID) | RECTAL | Status: DC | PRN

## 2014-11-13 MED ORDER — SODIUM CHLORIDE 0.9 % IJ SOLN
3.0000 mL | Freq: Two times a day (BID) | INTRAMUSCULAR | Status: DC
  Administered 2014-11-13 (×2): 3 mL via INTRAVENOUS

## 2014-11-13 MED ORDER — BISACODYL 10 MG RE SUPP: 10.0000 mg | Freq: Every day | RECTAL | Status: DC | PRN

## 2014-11-13 MED ORDER — HYDRALAZINE HCL 20 MG/ML IJ SOLN: 5.0000 mg | INTRAMUSCULAR | Status: DC | PRN

## 2014-11-13 MED ORDER — TAMSULOSIN HCL 0.4 MG PO CAPS
0.4000 mg | ORAL_CAPSULE | Freq: Every day | ORAL | Status: DC
  Administered 2014-11-13 – 2014-11-16 (×4): 0.4 mg via ORAL
  Filled 2014-11-13 (×4): qty 1

## 2014-11-13 MED ORDER — CYCLOSPORINE 0.05 % OP EMUL
1.0000 [drp] | Freq: Two times a day (BID) | OPHTHALMIC | Status: DC
  Administered 2014-11-13 – 2014-11-16 (×5): 1 [drp] via OPHTHALMIC
  Filled 2014-11-13 (×7): qty 1

## 2014-11-13 MED ORDER — MOMETASONE FURO-FORMOTEROL FUM 200-5 MCG/ACT IN AERO
2.0000 | INHALATION_SPRAY | Freq: Two times a day (BID) | RESPIRATORY_TRACT | Status: DC
  Administered 2014-11-13 – 2014-11-15 (×3): 2 via RESPIRATORY_TRACT
  Filled 2014-11-13: qty 8.8

## 2014-11-13 MED ORDER — DILTIAZEM HCL 100 MG IV SOLR: 5.0000 mg/h | Freq: Once | INTRAVENOUS | Status: DC

## 2014-11-13 MED ORDER — SODIUM CHLORIDE 0.9 % IV SOLN: 250.0000 mL | INTRAVENOUS | Status: DC | PRN

## 2014-11-13 MED ORDER — GABAPENTIN 100 MG PO CAPS
100.0000 mg | ORAL_CAPSULE | Freq: Three times a day (TID) | ORAL | Status: DC
  Administered 2014-11-13 – 2014-11-15 (×6): 100 mg via ORAL
  Filled 2014-11-13 (×6): qty 1

## 2014-11-13 MED ORDER — FLUTICASONE PROPIONATE 50 MCG/ACT NA SUSP
2.0000 | Freq: Every day | NASAL | Status: DC
  Administered 2014-11-13 – 2014-11-15 (×3): 2 via NASAL
  Filled 2014-11-13: qty 16

## 2014-11-13 MED ORDER — CITALOPRAM HYDROBROMIDE 10 MG PO TABS
10.0000 mg | ORAL_TABLET | Freq: Every day | ORAL | Status: DC
Start: 2014-11-13 — End: 2014-11-16
  Administered 2014-11-13 – 2014-11-16 (×4): 10 mg via ORAL
  Filled 2014-11-13 (×4): qty 1

## 2014-11-13 MED ORDER — IPRATROPIUM-ALBUTEROL 0.5-2.5 (3) MG/3ML IN SOLN
3.0000 mL | Freq: Once | RESPIRATORY_TRACT | Status: AC
  Administered 2014-11-13: 3 mL via RESPIRATORY_TRACT
  Filled 2014-11-13: qty 3

## 2014-11-13 MED ORDER — DILTIAZEM HCL 25 MG/5ML IV SOLN
5.0000 mg | Freq: Once | INTRAVENOUS | Status: DC
  Filled 2014-11-13: qty 5

## 2014-11-13 MED ORDER — ENOXAPARIN SODIUM 30 MG/0.3ML ~~LOC~~ SOLN
30.0000 mg | SUBCUTANEOUS | Status: DC
  Administered 2014-11-13 – 2014-11-15 (×3): 30 mg via SUBCUTANEOUS
  Filled 2014-11-13 (×3): qty 0.3

## 2014-11-13 MED ORDER — SENNOSIDES-DOCUSATE SODIUM 8.6-50 MG PO TABS
1.0000 | ORAL_TABLET | Freq: Every evening | ORAL | Status: DC | PRN
  Administered 2014-11-15: 1 via ORAL
  Filled 2014-11-13: qty 1

## 2014-11-13 MED ORDER — METOPROLOL TARTRATE 25 MG PO TABS
25.0000 mg | ORAL_TABLET | Freq: Once | ORAL | Status: AC
  Administered 2014-11-13: 25 mg via ORAL
  Filled 2014-11-13: qty 1

## 2014-11-13 MED ORDER — SODIUM CHLORIDE 0.9 % IJ SOLN: 3.0000 mL | INTRAMUSCULAR | Status: DC | PRN

## 2014-11-13 MED ORDER — ACETAMINOPHEN 325 MG PO TABS
650.0000 mg | ORAL_TABLET | Freq: Four times a day (QID) | ORAL | Status: DC | PRN
Start: 2014-11-13 — End: 2014-11-16
  Administered 2014-11-15: 650 mg via ORAL
  Filled 2014-11-13: qty 2

## 2014-11-13 MED ORDER — SODIUM BICARBONATE 8.4 % IV SOLN
INTRAVENOUS | Status: DC
  Administered 2014-11-13: 18:00:00 via INTRAVENOUS
  Filled 2014-11-13 (×3): qty 100

## 2014-11-13 MED ORDER — IPRATROPIUM-ALBUTEROL 0.5-2.5 (3) MG/3ML IN SOLN
3.0000 mL | Freq: Two times a day (BID) | RESPIRATORY_TRACT | Status: DC
  Filled 2014-11-13: qty 3

## 2014-11-13 MED ORDER — IPRATROPIUM-ALBUTEROL 0.5-2.5 (3) MG/3ML IN SOLN
3.0000 mL | Freq: Four times a day (QID) | RESPIRATORY_TRACT | Status: DC | PRN
  Administered 2014-11-14: 3 mL via RESPIRATORY_TRACT
  Filled 2014-11-13: qty 3

## 2014-11-13 MED ORDER — MORPHINE SULFATE (PF) 2 MG/ML IV SOLN
2.0000 mg | INTRAVENOUS | Status: DC | PRN
  Administered 2014-11-13 – 2014-11-15 (×3): 2 mg via INTRAVENOUS
  Filled 2014-11-13 (×3): qty 1

## 2014-11-13 NOTE — Progress Notes (Signed)
Pt sitting on side of bed, agitated, agitated, states he does not want to stay in the hospital tonight, impulsive at times wanting to get up out of bed, explained due to unsteady gait weakness and dysnea on exertion needs assistance to get up, still upset, sitter present, bed alarm set, ABG results received and messaged to Dr Coralyn Pear, VSS at present.  Edward Qualia RN

## 2014-11-13 NOTE — ED Notes (Signed)
Patient advised with family at bedside to not get up and ambulate or transfer without assistance from medical staff.  Patient is easily agitated and advising of how uncomfortable he is to this RN.  Patient was placed in a recliner chair with maximum assistance by 2 RNs, wife was at bedside during transfer.  Patient is very unsteady during ambulation.  Patient is across from nursing station with the door open for monitoring.

## 2014-11-13 NOTE — ED Notes (Signed)
Pt refuses to get in bed, wants to eat food first in chair. RN gave report. Wife at bedside.

## 2014-11-13 NOTE — ED Notes (Signed)
I-stat Troponin results given to PA Piepenbrink.

## 2014-11-13 NOTE — ED Notes (Signed)
MD Otter at bedside. 

## 2014-11-13 NOTE — H&P (Signed)
[facility] History and Physical  Gerald Gomez YQM:[id number] DOB: December 04, 1940 DOA: 11/13/2014  Referring physician:  PCP: [name], MD   Chief Complaint:  Shortness of breath                                             HPI: Gerald Gomez is a 74 y.o. male with a past medical history of chronic obstructive pulmonary disease, history of Parkinson's disease, gastroesophageal reflux disease, history of stage III chronic kidney disease who was recently admitted to the medicine service on 11/04/2014 and discharged on 11/07/2014 at which time he presented with a COPD exacerbation. During that hospitalization he was treated with IV steroids, IV antibiotic therapy and DuoNebs. He showed clinical improvement and was discharged on Levaquin and prednisone taper. He reports having progressive deterioration in his respiratory status since discharge. He has become increasingly short of breath having associated wheezing and cough with scant sputum production. He denies chest pain, fevers, chills, nausea, vomiting. A chest x-ray performed in the emergency department did not reveal acute infiltrate.                                                                                                               \ Review of Systems:  Constitutional:  No weight loss, night sweats, Fevers, chills, fatigue.  HEENT:  No headaches, Difficulty swallowing,Tooth/dental problems,Sore throat,  No sneezing, itching, ear ache, nasal congestion, post nasal drip,  Cardio-vascular:  No chest pain, Orthopnea, PND, swelling in lower extremities, anasarca, dizziness, palpitations  GI:  No heartburn, indigestion, abdominal pain, nausea, vomiting, diarrhea, change in bowel habits, loss of appetite  Resp:  Positive for shortness of breath with exertion or at rest. No excess mucus, no productive cough, No non-productive cough, No coughing up of blood.No change in color of mucus.No wheezing.No chest wall deformity  Skin:  no  rash or lesions.  GU:  no dysuria, change in color of urine, no urgency or frequency. No flank pain.  Musculoskeletal:  No joint pain or swelling. No decreased range of motion. No back pain.  Psych:  No change in mood or affect. No depression or anxiety. No memory loss.   Past Medical History  Diagnosis Date  . ALLERGIC RHINITIS 03/28/2009  . ANXIETY 03/28/2009  . CHOLELITHIASIS 03/28/2009  . DEPRESSION 03/28/2009  . DIVERTICULOSIS, COLON 03/28/2009  . 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 FATIGUE 03/28/2009  . HYPERLIPIDEMIA 03/28/2009  . HYPERTENSION 03/28/2009  . LOW BACK PAIN, CHRONIC 03/28/2009  . PERIPHERAL VASCULAR DISEASE 03/28/2009  . Personal history of alcoholism 03/28/2009  . THROMBOCYTOPENIA 03/28/2009  . Wheezing 02/07/2010  . CHF (congestive heart failure)   . Impaired glucose tolerance 12/01/2013  . Heart murmur   . Exertional dyspnea 11/04/2014  . History of stomach ulcers   . HEPATITIS C 03/28/2009  . DEGENERATIVE JOINT DISEASE 03/28/2009  . [place] DISEASE, LUMBAR 03/28/2009  .  Arthritis     "everywhere"  . Chronic kidney disease, stage 3     /notes 11/04/2014  . Chronic anemia     [name] 11/04/2014  . Thrombocytopenia     chronic/notes 11/04/2014  . Malignant neoplasm prostate     "in treatment for it now" (11/04/2014)  . Pneumonia     Sepsis- suspecting /notes 11/04/2014   Past Surgical History  Procedure Laterality Date  . Shoulder arthroscopy w/ rotator cuff repair Right 2003  . Cataract extraction w/ intraocular lens  implant, bilateral Bilateral   . Prostate biopsy    . Colonoscopy w/ polypectomy  06/29/2014    polpys proved to be non cancerous done at [place] History:  reports that he quit smoking about 19 years ago. His smoking use included Cigarettes. He has a 41 pack-year smoking history. He has never used smokeless tobacco. He reports that he drinks alcohol. He reports that he uses illicit drugs (Marijuana).  Allergies  Allergen Reactions  . Tramadol Nausea Only     Family History  Problem Relation Age of Onset  . Lung cancer Father   . Stroke Brother   . Breast cancer Daughter      Prior to Admission medications   Medication Sig Start Date End Date Taking? Authorizing Provider  albuterol (PROVENTIL HFA;VENTOLIN HFA) 108 (90 BASE) MCG/ACT inhaler Inhale 2 puffs into the lungs every 6 (six) hours as needed for wheezing or shortness of breath. Dispense Ventolin HFA please 06/01/14  Yes [name], MD  carbidopa-levodopa (SINEMET IR) 25-100 MG per tablet Take 1 tablet by mouth 3 (three) times daily. 07/02/14  Yes [name], DO  citalopram (CELEXA) 10 MG tablet take 1 tablet by mouth once daily 04/19/14  Yes [name], MD  cycloSPORINE (RESTASIS) 0.05 % ophthalmic emulsion Place 1 drop into both eyes 2 (two) times daily.   Yes Historical Provider, MD  Diclofenac Sodium 2 % SOLN Apply 1 pump twice daily. 02/25/14  Yes Lyndal Pulley, DO  fluticasone (FLONASE) 50 MCG/ACT nasal spray Place 2 sprays into both nostrils daily.   Yes Historical Provider, MD  furosemide (LASIX) 40 MG tablet Take 40 mg by mouth daily.  08/24/14  Yes Historical Provider, MD  gabapentin (NEURONTIN) 100 MG capsule Take 1 capsule (100 mg total) by mouth 3 (three) times daily. Patient taking differently: Take 200 mg by mouth 3 (three) times daily.  09/09/14  Yes [name], MD  hydrocortisone 2.5 % ointment Apply to affected area BID. 02/15/14  Yes Lyndal Pulley, DO  LORazepam (ATIVAN) 1 MG tablet Take 1 tablet (1 mg total) by mouth every 8 (eight) hours as needed for anxiety (30 min before radiation). 11/01/14  Yes Tyler Pita, MD  metoprolol tartrate (LOPRESSOR) 25 MG tablet take 1 tablet by mouth twice a day 07/21/14  Yes [name], MD  morphine (MSIR) 15 MG tablet Take 15 mg by mouth every 4 (four) hours as needed for severe pain. And 30 mins before radiation.   Yes Historical Provider, MD  potassium chloride (K-DUR,KLOR-CON) 10 MEQ tablet Take 10 mEq by mouth daily.    Yes Historical Provider, MD  prochlorperazine (COMPAZINE) 10 MG tablet Take 1 tablet (10 mg total) by mouth every 6 (six) hours as needed for nausea or vomiting. 11/01/14  Yes Tyler Pita, MD  tamsulosin [facility]) 0.4 MG CAPS capsule take 1 capsule by mouth once daily 11/27/13  Yes [name], MD  Lisbeth Ply  4 MG TB24 tablet Take 4 mg by mouth daily. 05/06/14  Yes Historical Provider, MD   Physical Exam: Filed Vitals:   11/13/14 0845 11/13/14 0900 11/13/14 0915 11/13/14 0930  BP: 153/77 170/76 171/70 191/69  Pulse: 113  103 101  Temp:      TempSrc:      Resp: 18 18 26 22   SpO2: 96%  96% 94%    Wt Readings from Last 3 Encounters:  11/07/14 98.7 kg (217 lb 9.5 oz)  11/01/14 95.437 kg (210 lb 6.4 oz)  10/21/14 96.389 kg (212 lb 8 oz)    General:  During my encounter he appear to be breathing comfortably, did not seem to be in respiratory distress, at least not using accessory muscles. He was awake and alert.  Eyes: PERRL, normal lids, irises & conjunctiva ENT: grossly normal hearing, lips & tongue, dry oral mucosa  Neck: no LAD, masses or thyromegaly Cardiovascular:  Tachycardic,RRR, no m/r/g.  Has bilateral traceLE edema. Telemetry:  Tachycardia,SR, no arrhythmias  Respiratory: he has diminished breath sounds bilaterally with few expiratory wheezes, currently satting mid 90s on room air, does not appear to be in acute respiratory distress Abdomen: soft, ntnd Skin: no rash or induration seen on limited exam Musculoskeletal: grossly normal tone BUE/BLE, trace edema to lower extremities bilaterally.  Psychiatric: grossly normal mood and affect, speech fluent and appropriate Neurologic: grossly non-focal.          Labs on Admission:  Basic Metabolic Panel:  Recent Labs Lab 11/07/14 0711 11/13/14 0640  NA 138 142  K 4.3 4.8  CL 113* 119*  CO2 17* 12*  GLUCOSE 144* 128*  BUN 75* 84*  CREATININE 2.62* 2.93*  CALCIUM 9.0 8.7*   Liver Function Tests: No results for input(s):  AST, ALT, ALKPHOS, BILITOT, PROT, ALBUMIN in the last 168 hours. No results for input(s): LIPASE, AMYLASE in the last 168 hours. No results for input(s): AMMONIA in the last 168 hours. CBC:  Recent Labs Lab 11/07/14 0711 11/13/14 0640  WBC 11.1* 8.0  NEUTROABS  --  5.9  HGB 8.2* 8.2*  HCT 25.9* 26.4*  MCV 86.6 85.2  PLT 106* 130*   Cardiac Enzymes:  Recent Labs Lab 11/13/14 0640  TROPONINI 0.10*    BNP (last 3 results)  Recent Labs  11/04/14 0810 11/13/14 0640  BNP 222.2* 3091.7*    ProBNP (last 3 results) No results for input(s): PROBNP in the last 8760 hours.  CBG: No results for input(s): GLUCAP in the last 168 hours.  Radiological Exams on Admission: Dg Chest Port 1 View  11/13/2014   CLINICAL DATA:  Shortness of breath for 1 day. Former smoker. Congestive heart failure. Hypertension.  EXAM: PORTABLE CHEST - 1 VIEW  COMPARISON:  11/05/2014  FINDINGS: Mild elevation of left hemidiaphragm remains stable. No evidence of pulmonary consolidation or edema. Mild cardiomegaly stable. No definite evidence of pleural effusion.  IMPRESSION: Mild elevation of left hemidiaphragm again noted.  No acute findings   Electronically Signed   By: Earle Gell M.D.   On: 11/13/2014 07:04    EKG: Independently reviewed.   Assessment/Plan Principal Problem:   Acute respiratory failure Active Problems:   COPD exacerbation   Hyperlipidemia   Essential hypertension   Acute renal failure superimposed on stage 3 chronic kidney disease   1. Acute respiratory failure. Evidence by ABG showing respiratory acidosis presenting with respiratory distress having a respiratory rate of 28. This is likely secondary to chronic obstructive pulmonary disease exacerbation.  Patient showing clinical improvement after the administration of nebulizer treatments and IV steroids in the emergency room. Will admit him to the step down unit for close monitoring. Plan to epeat an ABG at 12 noon.  2. Chronic  obstructive pulmonary disease exacerbation. Patient having a history of tobacco abuse, was recently hospitalized for a COPD exacerbation. He was discharged on prednisone taper and oral antimicrobial therapy. He presents with acute respiratory failure and respiratory acidosis. Will treat for COPD exacerbation with Solu-Medrol 60 mg IV every 6 hours along with scheduled DuoNebs and inhaled steroids. Chest x-ray did not reveal an acute infiltrate. He was recently treated with IV antibiotic and discharged on Levaquin and I don't think he would benefit from further antibiotic therapy. Plan to repeat chest x-ray in a.m. to ensure stability. 3. Suspected primary non-anion gap metabolic acidosis with secondary concomitant respiratory acidosis. Initial ABG performed in the emergency room showed a pH of 7.16 with PCO2 of 30 and PO2 of 66. He had a bicarbonate of 12, with normal anion gap. Non-gap metabolic acidosis could be coming from renal loss of bicarbonate in setting of acute on chronic renal failure. He also presented in acute respiratory failure from COPD exacerbation. After receiving IV steroids and DuoNeb's he  seems improved from a respiratory standpoint. Will repeat ABG at 12 noon. 4. Acute on chronic renal failure. Patient presenting with a creatinine of 2.9 with BUN of 84 previously creatinine was 2.6 with BUN of 75 on 11/07/2014. Could be related to hypovolemia in setting of diabetic therapy that he was taking at home. Will discontinue Lasix on admission. Mom status will need to be reassessed in a.m. 5. Hypertension.  Patient on Lopressor at home which will be discontinued due to presentation of respiratory failure in setting of COPD and bronchospasm. Will treat blood pressures with Cardizem 240 mg by mouth daily and will provide as needed hydralazine for systolic blood pressures greater than 165. 6. Sinus tachycardia. Likely secondary to respiratory distress/failure.  7. History of chronic diastolic  congestive heart failure. He had his last transthoracic echocardiogram performed on 03/25/2013 that showed grade 2 diastolic dysfunction with preserved ejection fraction. As mentioned above due to the presence of acute on chronic renal failure will hold Lasix. Plan to repeat BMP in a.m. and reassess his volume status. 8. History of Parkinson's disease. Will continue home regimen of carbidopa/levodopa 9. Chronic pain syndrome. Will continue his home regimen with MSIR 15 mg PRN and gabapentin 100 mg by mouth 3 times a day 10. Deconditioning. Will consult physical therapy 11. DVT prophylaxis for Lovenox   Code Status: Spoke with patient and wife, he is a full code Family Communication: I spoke to his wife was present at bedside Disposition Plan: will admit patient to the step down unit, and his pain he will require greater than 2 night hospitalization  Time spent: 70 min  Kelvin Cellar [facility] Pager [phone]

## 2014-11-13 NOTE — ED Notes (Signed)
Pt upset attempting to get out of bed.  Encouraged him to calm.  Will attempt to find recliner for pt comfort.

## 2014-11-13 NOTE — ED Notes (Addendum)
PA made aware of patients elevated HR in the 130's.  Patient is on the cardiac monitor.  Family at bedside.

## 2014-11-13 NOTE — ED Notes (Signed)
MD Rancour at bedside with the patient and family.

## 2014-11-13 NOTE — ED Notes (Signed)
EMS - Patient coming from home with c/o of shortness of breath, generalized wheezing and non-productive cough x 3 days.  Given 10mg  Albuterol, .5 Atrovent and 125mg  Solumedrol.  Continues with wheezing in the throat.  100-110 HR, 93% RA and 100% on Aerosol treatment, 190/88.  Hx of arthritis to the legs, patient is a high fall risk.

## 2014-11-13 NOTE — Progress Notes (Signed)
Progress Note  Suspect a primary non-anion gap metabolic acidosis. Will start bicarb gtt.

## 2014-11-13 NOTE — ED Provider Notes (Signed)
CSN: [id number]     Arrival date & time 11/13/14  7353 History   First MD Initiated Contact with Patient 11/13/14 0602     Chief Complaint  Patient presents with  . Shortness of Breath     (Consider location/radiation/quality/duration/timing/severity/associated sxs/prior Treatment) HPI Comments: Patient is a 74 yo PMH of prostate cancer with ongoing radiation treatment, not on chemotherapy, former heavy tobacco abuse, asthma/COPD, chronic diastolic CHF, chronic low back pain, anxiety & depression, hepatitis C, HLD, HTN, stage III chronic kidney disease, chronic anemia and thrombocytopenia presenting to the ED for three days of worsening shortness of breath. Patient was recently discharged home from the hospital for COPD exacerbation. No modifying factors identified. No home O2 requirement. Did not try his inhalers at home. No other medications tried PTA.   Patient is a 74 y.o. male presenting with shortness of breath.  Shortness of Breath Associated symptoms: cough   Associated symptoms: no chest pain     Past Medical History  Diagnosis Date  . ALLERGIC RHINITIS 03/28/2009  . ANXIETY 03/28/2009  . CHOLELITHIASIS 03/28/2009  . DEPRESSION 03/28/2009  . DIVERTICULOSIS, COLON 03/28/2009  . 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 FATIGUE 03/28/2009  . HYPERLIPIDEMIA 03/28/2009  . HYPERTENSION 03/28/2009  . LOW BACK PAIN, CHRONIC 03/28/2009  . PERIPHERAL VASCULAR DISEASE 03/28/2009  . Personal history of alcoholism 03/28/2009  . THROMBOCYTOPENIA 03/28/2009  . Wheezing 02/07/2010  . CHF (congestive heart failure)   . Impaired glucose tolerance 12/01/2013  . Heart murmur   . Exertional dyspnea 11/04/2014  . History of stomach ulcers   . HEPATITIS C 03/28/2009  . DEGENERATIVE JOINT DISEASE 03/28/2009  . [place] DISEASE, LUMBAR 03/28/2009  . Arthritis     "everywhere"  . Chronic kidney disease, stage 3     /notes 11/04/2014  . Chronic anemia     [name] 11/04/2014  . Thrombocytopenia     chronic/notes 11/04/2014   . Malignant neoplasm prostate     "in treatment for it now" (11/04/2014)  . Pneumonia     Sepsis- suspecting /notes 11/04/2014   Past Surgical History  Procedure Laterality Date  . Shoulder arthroscopy w/ rotator cuff repair Right 2003  . Cataract extraction w/ intraocular lens  implant, bilateral Bilateral   . Prostate biopsy    . Colonoscopy w/ polypectomy  06/29/2014    polpys proved to be non cancerous done at [facility] History  Problem Relation Age of Onset  . Lung cancer Father   . Stroke Brother   . Breast cancer Daughter    Social History  Substance Use Topics  . Smoking status: Former Smoker -- 1.00 packs/day for 41 years    Types: Cigarettes    Quit date: 03/27/1995  . Smokeless tobacco: Never Used  . Alcohol Use: Yes     Comment: quit drinking 1997    Review of Systems  Respiratory: Positive for cough and shortness of breath.   Cardiovascular: Negative for chest pain and leg swelling.  All other systems reviewed and are negative.     Allergies  Tramadol  Home Medications   Prior to Admission medications   Medication Sig Start Date End Date Taking? Authorizing Provider  albuterol (PROVENTIL HFA;VENTOLIN HFA) 108 (90 BASE) MCG/ACT inhaler Inhale 2 puffs into the lungs every 6 (six) hours as needed for wheezing or shortness of breath. Dispense Ventolin HFA please 06/01/14  Yes [name], MD  carbidopa-levodopa (SINEMET IR) 25-100 MG per tablet Take 1 tablet by mouth  3 (three) times daily. 07/02/14  Yes [name], DO  citalopram (CELEXA) 10 MG tablet take 1 tablet by mouth once daily 04/19/14  Yes [name], MD  cycloSPORINE (RESTASIS) 0.05 % ophthalmic emulsion Place 1 drop into both eyes 2 (two) times daily.   Yes Historical Provider, MD  Diclofenac Sodium 2 % SOLN Apply 1 pump twice daily. 02/25/14  Yes Lyndal Pulley, DO  fluticasone (FLONASE) 50 MCG/ACT nasal spray Place 2 sprays into both nostrils daily.   Yes Historical Provider, MD  furosemide  (LASIX) 40 MG tablet Take 40 mg by mouth daily.  08/24/14  Yes Historical Provider, MD  gabapentin (NEURONTIN) 100 MG capsule Take 1 capsule (100 mg total) by mouth 3 (three) times daily. Patient taking differently: Take 200 mg by mouth 3 (three) times daily.  09/09/14  Yes [name], MD  hydrocortisone 2.5 % ointment Apply to affected area BID. 02/15/14  Yes Lyndal Pulley, DO  LORazepam (ATIVAN) 1 MG tablet Take 1 tablet (1 mg total) by mouth every 8 (eight) hours as needed for anxiety (30 min before radiation). 11/01/14  Yes Tyler Pita, MD  metoprolol tartrate (LOPRESSOR) 25 MG tablet take 1 tablet by mouth twice a day 07/21/14  Yes [name], MD  morphine (MSIR) 15 MG tablet Take 15 mg by mouth every 4 (four) hours as needed for severe pain. And 30 mins before radiation.   Yes Historical Provider, MD  potassium chloride (K-DUR,KLOR-CON) 10 MEQ tablet Take 10 mEq by mouth daily.   Yes Historical Provider, MD  prochlorperazine (COMPAZINE) 10 MG tablet Take 1 tablet (10 mg total) by mouth every 6 (six) hours as needed for nausea or vomiting. 11/01/14  Yes Tyler Pita, MD  tamsulosin [facility]) 0.4 MG CAPS capsule take 1 capsule by mouth once daily 11/27/13  Yes [name], MD  TOVIAZ 4 MG TB24 tablet Take 4 mg by mouth daily. 05/06/14  Yes Historical Provider, MD   BP 191/69 mmHg  Pulse 101  Temp(Src) 99.4 F (37.4 C) (Rectal)  Resp 22  SpO2 94% Physical Exam  Constitutional: He appears well-developed and well-nourished.  HENT:  Head: Normocephalic and atraumatic.  Eyes: Conjunctivae are normal.  Neck: Neck supple.  Cardiovascular: Regular rhythm and normal heart sounds.  Tachycardia present.   Pulmonary/Chest:  Faint crackles appreciated. Diminished breath sounds at bases bilaterally.    Abdominal: Soft. There is no tenderness.  Musculoskeletal: He exhibits no edema.  Neurological: He is alert.  Oriented to self and place but not date/time Clear speech.   Skin: Skin is warm  and dry.  Nursing note and vitals reviewed.   ED Course  Procedures (including critical care time) Medications  diltiazem (CARDIZEM) injection 5 mg (0 mg Intravenous Hold 11/13/14 0848)  metoprolol tartrate (LOPRESSOR) tablet 25 mg (not administered)  ipratropium-albuterol (DUONEB) 0.5-2.5 (3) MG/3ML nebulizer solution 3 mL (3 mLs Nebulization Given 11/13/14 0652)  sodium chloride 0.9 % bolus 1,000 mL (0 mLs Intravenous Stopped 11/13/14 0824)  sodium chloride 0.9 % bolus 1,000 mL (1,000 mLs Intravenous New Bag/Given 11/13/14 0825)    Labs Review Labs Reviewed  BASIC METABOLIC PANEL - Abnormal; Notable for the following:    Chloride 119 (*)    CO2 12 (*)    Glucose, Bld 128 (*)    BUN 84 (*)    Creatinine, Ser 2.93 (*)    Calcium 8.7 (*)    GFR calc non Af Amer 19 (*)  GFR calc Af Amer 23 (*)    All other components within normal limits  CBC WITH DIFFERENTIAL/PLATELET - Abnormal; Notable for the following:    RBC 3.10 (*)    Hemoglobin 8.2 (*)    HCT 26.4 (*)    RDW 15.6 (*)    Platelets 130 (*)    Monocytes Relative 13 (*)    All other components within normal limits  BRAIN NATRIURETIC PEPTIDE - Abnormal; Notable for the following:    B Natriuretic Peptide 3091.7 (*)    All other components within normal limits  URINALYSIS, ROUTINE W REFLEX MICROSCOPIC (NOT AT [facility]) - Abnormal; Notable for the following:    Hgb urine dipstick LARGE (*)    Protein, ur 30 (*)    All other components within normal limits  TROPONIN I - Abnormal; Notable for the following:    Troponin I 0.10 (*)    All other components within normal limits  URINE MICROSCOPIC-ADD ON - Abnormal; Notable for the following:    Squamous Epithelial / LPF FEW (*)    Bacteria, UA FEW (*)    All other components within normal limits  I-STAT TROPOININ, ED - Abnormal; Notable for the following:    Troponin i, poc 0.09 (*)    All other components within normal limits  I-STAT ARTERIAL BLOOD GAS, ED - Abnormal; Notable  for the following:    pH, Arterial 7.165 (*)    pCO2 arterial 30.8 (*)    pO2, Arterial 66.0 (*)    Bicarbonate 11.1 (*)    Acid-base deficit 16.0 (*)    All other components within normal limits  CULTURE, BLOOD (ROUTINE X 2)  CULTURE, BLOOD (ROUTINE X 2)  URINE CULTURE  I-STAT CG4 LACTIC ACID, ED    Imaging Review Dg Chest Port 1 View  11/13/2014   CLINICAL DATA:  Shortness of breath for 1 day. Former smoker. Congestive heart failure. Hypertension.  EXAM: PORTABLE CHEST - 1 VIEW  COMPARISON:  11/05/2014  FINDINGS: Mild elevation of left hemidiaphragm remains stable. No evidence of pulmonary consolidation or edema. Mild cardiomegaly stable. No definite evidence of pleural effusion.  IMPRESSION: Mild elevation of left hemidiaphragm again noted.  No acute findings   Electronically Signed   By: Earle Gell M.D.   On: 11/13/2014 07:04   I have personally reviewed and evaluated these images and lab results as part of my medical decision-making.   EKG Interpretation   Date/Time:  Saturday November 13 2014 05:53:55 EDT Ventricular Rate:  88 PR Interval:  166 QRS Duration: 77 QT Interval:  369 QTC Calculation: 446 R Axis:   82 Text Interpretation:  Sinus rhythm Ventricular trigeminy Probable left  atrial enlargement Borderline right axis deviation Confirmed by OTTER  MD,  OLGA ([id number]) on 11/13/2014 7:32:53 AM      CRITICAL CARE Performed by: Baron Sane L   Total critical care time: 35 minutes  Critical care time was exclusive of separately billable procedures and treating other patients.  Critical care was necessary to treat or prevent imminent or life-threatening deterioration.  Critical care was time spent personally by me on the following activities: development of treatment plan with patient and/or surrogate as well as nursing, discussions with consultants, evaluation of patient's response to treatment, examination of patient, obtaining history from patient or  surrogate, ordering and performing treatments and interventions, ordering and review of laboratory studies, ordering and review of radiographic studies, pulse oximetry and re-evaluation of patient's condition.  MDM   Final  diagnoses:  Shortness of breath  Acute respiratory failure, unspecified whether with hypoxia or hypercapnia    Filed Vitals:   11/13/14 0930  BP: 191/69  Pulse: 101  Temp:   Resp: 22   No acute mental status changes per patient or family.   Lung examination improved after duoenb treatments and Solumedrol given en route. Patient has been maintaining oxygen saturations at room air at 93% or above. Accessory muscle use resolved with improvement of respiratory complaint.   Anemia is at baseline. Thrombocytopenia at baseline.  Troponin elevated, likey secondary to demand rather than ischemia.  Tachycardia improved after holding further breathing treatments, noted to be sinus tachy. Has remained low 100s. Will order home BP medication for HTN.   Patient will be admitted to [facility] for SOB evaluation and management as well as further management of metabolic acidosis likely secondary to renal disease.   Patient d/w with Dr. Sharol Given, agrees with plan.    Baron Sane, PA-C 11/13/14 1441  Linton Flemings, MD 11/14/14 0430

## 2014-11-14 ENCOUNTER — Other Ambulatory Visit: Payer: Self-pay | Admitting: Radiation Oncology

## 2014-11-14 ENCOUNTER — Inpatient Hospital Stay (HOSPITAL_COMMUNITY): Payer: Medicare Other

## 2014-11-14 DIAGNOSIS — E872 Acidosis, unspecified: Secondary | ICD-10-CM | POA: Diagnosis present

## 2014-11-14 DIAGNOSIS — E785 Hyperlipidemia, unspecified: Secondary | ICD-10-CM

## 2014-11-14 DIAGNOSIS — K627 Radiation proctitis: Secondary | ICD-10-CM

## 2014-11-14 LAB — BASIC METABOLIC PANEL
Anion gap: 9 (ref 5–15)
BUN: 81 mg/dL — AB (ref 6–20)
CHLORIDE: 117 mmol/L — AB (ref 101–111)
CO2: 16 mmol/L — AB (ref 22–32)
CREATININE: 2.63 mg/dL — AB (ref 0.61–1.24)
Calcium: 8.8 mg/dL — ABNORMAL LOW (ref 8.9–10.3)
GFR calc Af Amer: 26 mL/min — ABNORMAL LOW (ref >60)
GFR calc non Af Amer: 22 mL/min — ABNORMAL LOW (ref >60)
Glucose, Bld: 175 mg/dL — ABNORMAL HIGH (ref 65–99)
Potassium: 4.6 mmol/L (ref 3.5–5.1)
SODIUM: 142 mmol/L (ref 135–145)

## 2014-11-14 LAB — URINE CULTURE: CULTURE: NO GROWTH

## 2014-11-14 LAB — CBC
HCT: 23.6 % — ABNORMAL LOW (ref 39.0–52.0)
Hemoglobin: 7.6 g/dL — ABNORMAL LOW (ref 13.0–17.0)
MCH: 27.5 pg (ref 26.0–34.0)
MCHC: 32.2 g/dL (ref 30.0–36.0)
MCV: 85.5 fL (ref 78.0–100.0)
PLATELETS: 103 10*3/uL — AB (ref 150–400)
RBC: 2.76 MIL/uL — ABNORMAL LOW (ref 4.22–5.81)
RDW: 15.6 % — AB (ref 11.5–15.5)
WBC: 8.5 10*3/uL (ref 4.0–10.5)

## 2014-11-14 LAB — BRAIN NATRIURETIC PEPTIDE: B Natriuretic Peptide: 2334.6 pg/mL — ABNORMAL HIGH (ref 0.0–100.0)

## 2014-11-14 LAB — TROPONIN I: TROPONIN I: 0.17 ng/mL — AB (ref <0.031)

## 2014-11-14 MED ORDER — FUROSEMIDE 10 MG/ML IJ SOLN
40.0000 mg | Freq: Two times a day (BID) | INTRAMUSCULAR | Status: DC
  Administered 2014-11-14 – 2014-11-15 (×2): 40 mg via INTRAVENOUS
  Filled 2014-11-14 (×2): qty 4

## 2014-11-14 MED ORDER — LEVALBUTEROL HCL 0.63 MG/3ML IN NEBU
0.6300 mg | INHALATION_SOLUTION | Freq: Four times a day (QID) | RESPIRATORY_TRACT | Status: DC
  Administered 2014-11-14 – 2014-11-16 (×6): 0.63 mg via RESPIRATORY_TRACT
  Filled 2014-11-14 (×8): qty 3

## 2014-11-14 MED ORDER — GUAIFENESIN-DM 100-10 MG/5ML PO SYRP: 5.0000 mL | ORAL_SOLUTION | ORAL | Status: DC | PRN

## 2014-11-14 MED ORDER — DEXTROSE 5 % IV SOLN
INTRAVENOUS | Status: DC
  Administered 2014-11-14: 18:00:00 via INTRAVENOUS
  Filled 2014-11-14 (×3): qty 150

## 2014-11-14 MED ORDER — GUAIFENESIN ER 600 MG PO TB12
1200.0000 mg | ORAL_TABLET | Freq: Two times a day (BID) | ORAL | Status: DC
  Administered 2014-11-14 – 2014-11-16 (×5): 1200 mg via ORAL
  Filled 2014-11-14 (×5): qty 2

## 2014-11-14 MED ORDER — METHYLPREDNISOLONE SODIUM SUCC 125 MG IJ SOLR
60.0000 mg | Freq: Two times a day (BID) | INTRAMUSCULAR | Status: DC
  Administered 2014-11-14: 60 mg via INTRAVENOUS
  Filled 2014-11-14 (×2): qty 2

## 2014-11-14 MED ORDER — HYDROCORTISONE 2.5 % RE CREA: 1.0000 "application " | TOPICAL_CREAM | Freq: Two times a day (BID) | RECTAL | Status: DC

## 2014-11-14 MED ORDER — MAGNESIUM SULFATE 2 GM/50ML IV SOLN
2.0000 g | Freq: Once | INTRAVENOUS | Status: AC
  Administered 2014-11-14: 2 g via INTRAVENOUS
  Filled 2014-11-14: qty 50

## 2014-11-14 MED ORDER — DILTIAZEM HCL 25 MG/5ML IV SOLN
10.0000 mg | Freq: Once | INTRAVENOUS | Status: AC
  Administered 2014-11-15: 10 mg via INTRAVENOUS
  Filled 2014-11-14: qty 5

## 2014-11-14 MED ORDER — ISOSORBIDE MONONITRATE ER 60 MG PO TB24
60.0000 mg | ORAL_TABLET | Freq: Every day | ORAL | Status: DC
  Administered 2014-11-14 – 2014-11-16 (×3): 60 mg via ORAL
  Filled 2014-11-14 (×3): qty 1

## 2014-11-14 MED ORDER — DILTIAZEM HCL 25 MG/5ML IV SOLN
5.0000 mg | Freq: Once | INTRAVENOUS | Status: AC
  Administered 2014-11-14: 5 mg via INTRAVENOUS
  Filled 2014-11-14: qty 5

## 2014-11-14 NOTE — Progress Notes (Signed)
[facility] PROGRESS NOTE   Gerald Gomez ALP:[id number] DOB: [birth year] DOA: 11/13/2014 PCP: [name], MD  HPI/Subjective: Patient was not happy this morning because she lost IV line. Initially he wanted to go home because his wife is sick.  Assessment/Plan: Principal Problem:   Acute respiratory failure Active Problems:   Hyperlipidemia   Essential hypertension   Acute renal failure superimposed on stage 3 chronic kidney disease   COPD exacerbation    Respiratory distress Presented to the hospital with wheezing and shortness of breath. Cannot rule out COPD versus cardiac asthma. Patient does not have fever, no sputum production, treated recently with antibiotics. Hold on antibiotics, start diuretics, check 2-D echo and BNP. Chest x-ray showed vascular congestion and and cardiomegaly.  Essential hypertension, accelerated hypertension Patient presents to hospital with blood pressure of 191/69. This is improved now. Patient has tachycardia, EKG in the ED showed sinus tachycardia. Repeat EKG.  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 Presented with a bicarbonate of 12, placed on bicarbonate drip on admission. I will start Lasix and decrease the rate of bicarbonate drip.  CKD stage III Patient is around baseline, discharge last time from the hospital with creatinine of 2.6. Patient admitted to the hospital with creatinine of 2.9 currently is 2.6.  Tachycardia Could be secondary to her respiratory distress/hypoxia versus beta agonist broncho-dilators. Improved since admission.  Elevated troponin Troponin slightly elevated at 0.13 on admission, likely secondary to fluid overload and a high creatinine. Patient does not have chest pain, check 2-D echocardiogram  Code Status: Full Code Family Communication: Plan discussed with the patient. Disposition Plan: Remains inpatient Diet: Diet Heart Room service  appropriate?: Yes; Fluid consistency:: Thin  Consultants:  None  Procedures:  None  Antibiotics:  None   Objective: Filed Vitals:   11/14/14 1128  BP: 161/93  Pulse: 72  Temp: 97.4 F (36.3 C)  Resp: 18    Intake/Output Summary (Last 24 hours) at 11/14/14 1357 Last data filed at 11/14/14 1352  Gross per 24 hour  Intake    958 ml  Output   1350 ml  Net   -392 ml   Filed Weights   11/13/14 1306  Weight: 101.152 kg (223 lb)    Exam: General: Alert and awake, oriented x3, not in any acute distress. HEENT: anicteric sclera, pupils reactive to light and accommodation, EOMI CVS: S1-S2 clear, no murmur rubs or gallops Chest: clear to auscultation bilaterally, no wheezing, rales or rhonchi Abdomen: soft nontender, nondistended, normal bowel sounds, no organomegaly Extremities: +1 to +2 edema noted bilaterally Neuro: Cranial nerves II-XII intact, no focal neurological deficits  Data Reviewed: Basic Metabolic Panel:  Recent Labs Lab 11/13/14 0640 11/13/14 1425 11/14/14 0220  NA 142  --  142  K 4.8  --  4.6  CL 119*  --  117*  CO2 12*  --  16*  GLUCOSE 128*  --  175*  BUN 84*  --  81*  CREATININE 2.93* 2.85* 2.63*  CALCIUM 8.7*  --  8.8*   Liver Function Tests: No results for input(s): AST, ALT, ALKPHOS, BILITOT, PROT, ALBUMIN in the last 168 hours. No results for input(s): LIPASE, AMYLASE in the last 168 hours. No results for input(s): AMMONIA in the last 168 hours. CBC:  Recent Labs Lab 11/13/14 0640 11/13/14 1425 11/14/14 0220  WBC 8.0 9.5 8.5  NEUTROABS 5.9  --   --   HGB 8.2* 7.7* 7.6*  HCT 26.4*  24.7* 23.6*  MCV 85.2 85.2 85.5  PLT 130* 133* 103*   Cardiac Enzymes:  Recent Labs Lab 11/13/14 0640 11/13/14 1425 11/13/14 2022 11/14/14 0220  TROPONINI 0.10* 0.13* 0.14* 0.17*   BNP (last 3 results)  Recent Labs  11/04/14 0810 11/13/14 0640  BNP 222.2* 3091.7*    ProBNP (last 3 results) No results for input(s): PROBNP in the  last 8760 hours.  CBG: No results for input(s): GLUCAP in the last 168 hours.  Micro Recent Results (from the past 240 hour(s))  Culture, blood (routine x 2)     Status: None (Preliminary result)   Collection Time: 11/13/14  6:40 AM  Result Value Ref Range Status   Specimen Description BLOOD RIGHT ANTECUBITAL  Final   Special Requests BOTTLES DRAWN AEROBIC AND ANAEROBIC 5CC  Final   Culture PENDING  Incomplete   Report Status PENDING  Incomplete  Urine culture     Status: None   Collection Time: 11/13/14  7:23 AM  Result Value Ref Range Status   Specimen Description URINE, CLEAN CATCH  Final   Special Requests NONE  Final   Culture NO GROWTH 1 DAY  Final   Report Status 11/14/2014 FINAL  Final  MRSA PCR Screening     Status: None   Collection Time: 11/13/14  2:15 PM  Result Value Ref Range Status   MRSA by PCR NEGATIVE NEGATIVE Final    Comment:        The GeneXpert MRSA Assay (FDA approved for NASAL specimens only), is one component of a comprehensive MRSA colonization surveillance program. It is not intended to diagnose MRSA infection nor to guide or monitor treatment for MRSA infections.      Studies: X-ray Chest Pa And Lateral  11/14/2014   CLINICAL DATA:  Patient with acute respiratory failure.  EXAM: CHEST  2 VIEW  COMPARISON:  Chest radiograph 11/13/2014  FINDINGS: Stable enlarged cardiac and mediastinal contours. Low lung volumes. Bibasilar heterogeneous opacities. No pleural effusion or pneumothorax. Regional skeleton is unremarkable.  IMPRESSION: Cardiomegaly and pulmonary vascular redistribution. Basilar atelectasis.   Electronically Signed   By: Lovey Newcomer M.D.   On: 11/14/2014 09:53   Dg Chest Port 1 View  11/13/2014   CLINICAL DATA:  Shortness of breath for 1 day. Former smoker. Congestive heart failure. Hypertension.  EXAM: PORTABLE CHEST - 1 VIEW  COMPARISON:  11/05/2014  FINDINGS: Mild elevation of left hemidiaphragm remains stable. No evidence of  pulmonary consolidation or edema. Mild cardiomegaly stable. No definite evidence of pleural effusion.  IMPRESSION: Mild elevation of left hemidiaphragm again noted.  No acute findings   Electronically Signed   By: Earle Gell M.D.   On: 11/13/2014 07:04    Scheduled Meds: . carbidopa-levodopa  1 tablet Oral TID WC  . citalopram  10 mg Oral Daily  . cycloSPORINE  1 drop Both Eyes BID  . diltiazem  240 mg Oral Daily  . enoxaparin (LOVENOX) injection  30 mg Subcutaneous Q24H  . fluticasone  2 spray Each Nare Daily  . gabapentin  100 mg Oral TID  . ipratropium-albuterol  3 mL Nebulization BID  . methylPREDNISolone (SOLU-MEDROL) injection  60 mg Intravenous Q6H  . mometasone-formoterol  2 puff Inhalation BID  . sodium chloride  3 mL Intravenous Q12H  . sodium chloride  3 mL Intravenous Q12H  . tamsulosin  0.4 mg Oral Daily   Continuous Infusions: .  sodium bicarbonate  infusion 1000 mL 75 mL/hr at 11/14/14 1307  Time spent: 35 minutes    [facility] A  [facility] Pager [phone] If 7PM-7AM, please contact night-coverage at [email or web address], password [facility] 11/14/2014, 1:57 PM  LOS: 1 day

## 2014-11-14 NOTE — Evaluation (Signed)
Physical Therapy Evaluation Patient Details Name: Gerald Gomez MRN: [id number] DOB: Feb 17, 1941 Today's Date: 11/14/2014   History of Present Illness    74 y.o. male with a past medical history of chronic obstructive pulmonary disease, history of Parkinson's disease, gastroesophageal reflux disease, history of stage III chronic kidney disease who was recently admitted to the medicine service on 11/04/2014 and discharged on 11/07/2014 at which time he presented with a COPD exacerbation.  Presents with increasing shortness of breath, wheezing, cough.   Clinical Impression  Pt presents with moderate to severe functional limitations related to low back pain (chronic), pulmonary dysfunction, cognitive changes and behavioral challenges.  Pt able to walk short distance to door of room but requires standing rest break and coaxing to return to chair, noting slight incr in work of breath.  Moderately agitated but intermittently cooperative with PT, however upset with wearing only hospital gown.  Once back to chair, pt fell asleep (ativan was admin 20 min before; fatigue likely contributed).  Per nurse, pt's wife cannot provide physical assistance and based on functional presentation, recommend CSW for assist with SNF placement for post acute rehab needs.  Will initiate services in acute setting and reassess with medical progress.  See below for details of exam findings.    Follow Up Recommendations SNF;Supervision/Assistance - 24 hour    Equipment Recommendations  None recommended by PT    Recommendations for Other Services Rehab consult     Precautions / Restrictions Precautions Precautions: Fall Precaution Comments: impulsive, use alarms and up with RW for safest transfers      Mobility  Bed Mobility Overal bed mobility:  (not observed pt up at EOB with nurse, to chair after)                Transfers Overall transfer level: Needs assistance Equipment used: Standard  walker Transfers: Sit to/from Stand Sit to Stand: +2 safety/equipment (pt = 75%)         General transfer comment: cues for optimal hand placement for safety with RW; CNA assisted for safety initially at pt's side, then followed with chair as needed for gait (see below); cues to align with chair prior to sit vs. impulsive method  Ambulation/Gait Ambulation/Gait assistance: Min assist;+2 safety/equipment (chair behind) Ambulation Distance (Feet): 20 Feet Assistive device: Rolling walker (2 wheeled) Gait Pattern/deviations: Step-to pattern;Decreased stride length;Shuffle;Trunk flexed;Antalgic     General Gait Details: walked to door and back, but standing rest at door 2/2 pain in back and SOB/fatigue.  Pt not able to consistenly or accurately self assess performance; struggled to return to chair and as noted began to sit to chair before close enough  or well aligned.  Became agitated after, hot, incr work of breath, then angry as he wanted pants; then seemed to go to sleep, which could be from extreme fatigue vs. ativan  Stairs            Wheelchair Mobility    Modified Rankin (Stroke Patients Only)       Balance Overall balance assessment: Needs assistance Sitting-balance support: No upper extremity supported;Feet supported Sitting balance-Leahy Scale: Fair     Standing balance support: Bilateral upper extremity supported;During functional activity Standing balance-Leahy Scale: Poor Standing balance comment: dependent on RW for support; unable to further assess balance as pt fatigued and not able to continue  Pertinent Vitals/Pain Pain Assessment:  (chronic back pain) Faces Pain Scale: Hurts a little bit Pain Location: back Pain Intervention(s): Limited activity within patient's tolerance;Monitored during session;Repositioned    Home Living Family/patient expects to be discharged to:: Private residence Living Arrangements:  Spouse/significant other Available Help at Discharge: Family;Available 24 hours/day Type of Home: House Home Access: Stairs to enter Entrance Stairs-Rails: None Entrance Stairs-Number of Steps: 2 Home Layout: One level Home Equipment: Walker - 2 wheels;Cane - single point Additional Comments: sleeps in recliner at home    Prior Function Level of Independence: Independent with assistive device(s)         Comments: Amb with walker or cane     Hand Dominance        Extremity/Trunk Assessment   Upper Extremity Assessment: Generalized weakness           Lower Extremity Assessment: Generalized weakness      Cervical / Trunk Assessment: Other exceptions (chronic back pain = flexion in walking)  Communication   Communication: Other (comment) (variable evidence of uncomplicated communication)  Cognition Arousal/Alertness: Awake/alert;Lethargic;Suspect due to medications (initially alert, but lethargy by end, pt sleeping in chair) Behavior During Therapy: Agitated;Restless;Anxious;Impulsive Overall Cognitive Status: Difficult to assess Area of Impairment: Attention;Following commands;Safety/judgement   Current Attention Level: Sustained   Following Commands: Follows multi-step commands inconsistently Safety/Judgement: Decreased awareness of safety     General Comments: Per previous documentation pt has hx intermittent confusion.  Pt inaccurately identified this PT as (perhaps) his HHPT and insisted we knew each other.  Agitated about wanting to wear underwear/pants. Nurse just gave ativan, which may have contributed to pt falling asleep in chair following short session    General Comments      Exercises        Assessment/Plan    PT Assessment Patient needs continued PT services  PT Diagnosis Generalized weakness;Difficulty walking   PT Problem List Pain;Obesity;Cardiopulmonary status limiting activity;Decreased knowledge of precautions;Decreased safety  awareness;Decreased knowledge of use of DME;Decreased cognition;Decreased mobility;Decreased activity tolerance;Decreased balance  PT Treatment Interventions Patient/family education;Cognitive remediation;Balance training;Therapeutic exercise;Therapeutic activities;Functional mobility training;Gait training;DME instruction   PT Goals (Current goals can be found in the Care Plan section) Acute Rehab PT Goals PT Goal Formulation: Patient unable to participate in goal setting Time For Goal Achievement: 11/28/14 Potential to Achieve Goals: Fair    Frequency Min 2X/week   Barriers to discharge Decreased caregiver support wife needs pt to be at supervision or better level    Co-evaluation               End of Session Equipment Utilized During Treatment: Gait belt Activity Tolerance: Patient limited by fatigue;Patient limited by lethargy;Patient limited by pain Patient left: in chair;with call bell/phone within reach;with nursing/sitter in room;with chair alarm set Nurse Communication: Mobility status         Time: 9509-3267 PT Time Calculation (min) (ACUTE ONLY): 29 min   Charges:   PT Evaluation $Initial PT Evaluation Tier I: 1 Procedure PT Treatments $Therapeutic Activity: 8-22 mins   PT G Codes:        Herbie Drape 11/14/2014, 2:21 PM

## 2014-11-14 NOTE — Progress Notes (Signed)
Pt removed his IV during previous shift, order made to IV team, refusing new IV, states he plans on going home today. Edward Qualia RN

## 2014-11-14 NOTE — Progress Notes (Signed)
Pt has been restless and mildly confused this shift, insists that he is going home, wife was at bedside briefly, but pt became agitated and verbally abusive when she wouldn't take him home, pt up in chair at present, with bed alarm set up, will continue to monitor closely. Edward Qualia RN

## 2014-11-14 NOTE — Progress Notes (Signed)
Pt complaining of SOB, audible exp wheezes, crackles noted to bilat lower lung fields,  PRN neb tx given, sitting upright in chair, 02 @ 2 liters BNC, will continue to monitor closely. Edward Qualia RN

## 2014-11-14 NOTE — Progress Notes (Signed)
Utilization Review Completed.[name] T8/21/2016  

## 2014-11-15 ENCOUNTER — Inpatient Hospital Stay (HOSPITAL_COMMUNITY): Payer: Medicare Other

## 2014-11-15 ENCOUNTER — Ambulatory Visit: Payer: Medicare Other

## 2014-11-15 ENCOUNTER — Telehealth: Payer: Self-pay | Admitting: Radiation Oncology

## 2014-11-15 DIAGNOSIS — R0600 Dyspnea, unspecified: Secondary | ICD-10-CM

## 2014-11-15 LAB — BLOOD GAS, ARTERIAL
Acid-base deficit: 9.1 mmol/L — ABNORMAL HIGH (ref 0.0–2.0)
BICARBONATE: 16.5 meq/L — AB (ref 20.0–24.0)
Drawn by: 275531
FIO2: 0.21
O2 Saturation: 96.1 %
PATIENT TEMPERATURE: 98.6
PCO2 ART: 37.8 mmHg (ref 35.0–45.0)
PH ART: 7.264 — AB (ref 7.350–7.450)
PO2 ART: 83.1 mmHg (ref 80.0–100.0)
TCO2: 17.7 mmol/L (ref 0–100)

## 2014-11-15 LAB — BASIC METABOLIC PANEL
Anion gap: 12 (ref 5–15)
BUN: 85 mg/dL — AB (ref 6–20)
CHLORIDE: 111 mmol/L (ref 101–111)
CO2: 16 mmol/L — AB (ref 22–32)
CREATININE: 2.69 mg/dL — AB (ref 0.61–1.24)
Calcium: 8.5 mg/dL — ABNORMAL LOW (ref 8.9–10.3)
GFR calc Af Amer: 25 mL/min — ABNORMAL LOW (ref >60)
GFR calc non Af Amer: 22 mL/min — ABNORMAL LOW (ref >60)
GLUCOSE: 176 mg/dL — AB (ref 65–99)
Potassium: 3.8 mmol/L (ref 3.5–5.1)
Sodium: 139 mmol/L (ref 135–145)

## 2014-11-15 LAB — CBC WITH DIFFERENTIAL/PLATELET
Basophils Absolute: 0 10*3/uL (ref 0.0–0.1)
Basophils Relative: 0 % (ref 0–1)
EOS ABS: 0 10*3/uL (ref 0.0–0.7)
EOS PCT: 0 % (ref 0–5)
HCT: 22 % — ABNORMAL LOW (ref 39.0–52.0)
HEMOGLOBIN: 7 g/dL — AB (ref 13.0–17.0)
LYMPHS ABS: 0.3 10*3/uL — AB (ref 0.7–4.0)
Lymphocytes Relative: 3 % — ABNORMAL LOW (ref 12–46)
MCH: 26.4 pg (ref 26.0–34.0)
MCHC: 31.8 g/dL (ref 30.0–36.0)
MCV: 83 fL (ref 78.0–100.0)
MONOS PCT: 6 % (ref 3–12)
Monocytes Absolute: 0.6 10*3/uL (ref 0.1–1.0)
Neutro Abs: 9 10*3/uL — ABNORMAL HIGH (ref 1.7–7.7)
Neutrophils Relative %: 91 % — ABNORMAL HIGH (ref 43–77)
PLATELETS: 118 10*3/uL — AB (ref 150–400)
RBC: 2.65 MIL/uL — ABNORMAL LOW (ref 4.22–5.81)
RDW: 15.4 % (ref 11.5–15.5)
WBC: 9.9 10*3/uL (ref 4.0–10.5)

## 2014-11-15 LAB — MAGNESIUM: Magnesium: 2.2 mg/dL (ref 1.7–2.4)

## 2014-11-15 LAB — ABO/RH: ABO/RH(D): O POS

## 2014-11-15 LAB — PREPARE RBC (CROSSMATCH)

## 2014-11-15 MED ORDER — CIPROFLOXACIN IN D5W 400 MG/200ML IV SOLN: 400.0000 mg | INTRAVENOUS | Status: DC

## 2014-11-15 MED ORDER — DILTIAZEM HCL 25 MG/5ML IV SOLN
10.0000 mg | Freq: Once | INTRAVENOUS | Status: AC
  Administered 2014-11-15: 10 mg via INTRAVENOUS
  Filled 2014-11-15: qty 5

## 2014-11-15 MED ORDER — FLEET ENEMA 7-19 GM/118ML RE ENEM: 1.0000 | ENEMA | Freq: Once | RECTAL | Status: DC

## 2014-11-15 MED ORDER — FUROSEMIDE 10 MG/ML IJ SOLN
60.0000 mg | Freq: Two times a day (BID) | INTRAMUSCULAR | Status: DC
  Administered 2014-11-15 – 2014-11-16 (×2): 60 mg via INTRAVENOUS
  Filled 2014-11-15 (×2): qty 6

## 2014-11-15 MED ORDER — METOPROLOL TARTRATE 25 MG PO TABS
25.0000 mg | ORAL_TABLET | Freq: Two times a day (BID) | ORAL | Status: DC
  Administered 2014-11-15 – 2014-11-16 (×3): 25 mg via ORAL
  Filled 2014-11-15 (×3): qty 1

## 2014-11-15 MED ORDER — SODIUM BICARBONATE 650 MG PO TABS
650.0000 mg | ORAL_TABLET | Freq: Two times a day (BID) | ORAL | Status: DC
  Administered 2014-11-15 – 2014-11-16 (×3): 650 mg via ORAL
  Filled 2014-11-15 (×3): qty 1

## 2014-11-15 MED ORDER — FUROSEMIDE 10 MG/ML IJ SOLN
20.0000 mg | Freq: Once | INTRAMUSCULAR | Status: AC
  Administered 2014-11-15: 20 mg via INTRAVENOUS
  Filled 2014-11-15: qty 2

## 2014-11-15 MED ORDER — METOPROLOL TARTRATE 1 MG/ML IV SOLN
5.0000 mg | Freq: Four times a day (QID) | INTRAVENOUS | Status: DC | PRN
  Administered 2014-11-16: 5 mg via INTRAVENOUS
  Filled 2014-11-15: qty 5

## 2014-11-15 NOTE — Clinical Social Work Note (Signed)
Clinical Social Work Assessment  Patient Details  Name: Gerald Gomez MRN: [id number] Date of Birth: 08/29/1940  Date of referral:  11/15/14               Reason for consult:  Facility Placement                Permission sought to share information with:  Chartered certified accountant granted to share information::  Yes, Verbal Permission Granted (Granted by pt wife)  Name::        Agency::  SNFs for referral purposes  Relationship::     Contact Information:     Housing/Transportation Living arrangements for the past 2 months:  Single Family Home Source of Information:  Spouse Patient Interpreter Needed:  None Criminal Activity/Legal Involvement Pertinent to Current Situation/Hospitalization:  No - Comment as needed Significant Relationships:  Spouse Lives with:  Spouse Do you feel safe going back to the place where you live?  Yes Need for family participation in patient care:  Yes (Comment) (Pt lethargic during conversation)  Care giving concerns:  Pt requiring assistance with ambulation   Social Worker assessment / plan:  CSW visited pt room to discuss SNF recommendation with pt. Pt falling asleep mid conversation with nurse and unable to participate in assessment. CSW spoke with pt wife who informed CSW she feels SNF would be good because she doesn't feel she can handle pt care at home. However, she is unsure if pt will be agreeable. CSW explained SNF referral process. Pt wife agreeable to referral being sent to all [facility]. Pt wife to speak with pt about dc plan when he is more alert. She clarified multiple times that sending our referral did not mean pt had to dc to SNF. CSW confirmed dc plan can be changed for home if pt is not agreeable.  Employment status:  Retired Forensic scientist:  Medicare PT Recommendations:  [place] / Referral to community resources:  [place]  Patient/Family's Response to  care:  Pt wife unsure of plan of care and would like to discuss further with pt.  Patient/Family's Understanding of and Emotional Response to Diagnosis, Current Treatment, and Prognosis:  Pt wife has a fair understanding of pt condition. Unable to assess pt understanding at this time.  Emotional Assessment Appearance:  Appears stated age, Well-Groomed Attitude/Demeanor/Rapport:  Unable to Assess Affect (typically observed):  Unable to Assess Orientation:  Oriented to Self, Oriented to Place, Oriented to Situation Alcohol / Substance use:  Not Applicable Psych involvement (Current and /or in the community):  No (Comment)  Discharge Needs  Concerns to be addressed:  Discharge Planning Concerns Readmission within the last 30 days:  No Current discharge risk:  Dependent with Mobility Barriers to Discharge:  Continued Medical Work up  [employer], [place]

## 2014-11-15 NOTE — Telephone Encounter (Signed)
Received call from patient's wife. She reports her husband was hospitalized on Saturday to [facility] for confusion. She explains the intent is for him to be discharged on Wednesday. She is calling to cancel radiation treatments until then. Informed Emily, RT on L1 of this finding. Wife states, "he really wants to complete his radiation treatment." Reassured her radiation would be available once her husband became stronger. She verbalized understanding.

## 2014-11-15 NOTE — Progress Notes (Signed)
Hgb 7.0 notified L. Harduk.

## 2014-11-15 NOTE — Progress Notes (Signed)
Patients HR in the 130s EKG confirmed A-fib. Vitals stable. Notified Baltazar Najjar NP. Will continue to monitor until any new orders.

## 2014-11-15 NOTE — Progress Notes (Signed)
[facility] PROGRESS NOTE   Gerald Gomez DXA:[id number] DOB: [birth year] DOA: 11/13/2014 PCP: [name], MD  HPI/Subjective: Was sleepy this morning, he awakened, he is improving. Reported no shortness of breath or chest pain, cough is less.still has minimal wheezing.  Assessment/Plan: Principal Problem:   Acute respiratory failure Active Problems:   Hyperlipidemia   Essential hypertension   Acute renal failure superimposed on stage 3 chronic kidney disease   COPD exacerbation   Metabolic acidosis    Respiratory distress Presented to the hospital with wheezing and shortness of breath. Cannot rule out COPD versus cardiac asthma. Patient does not have fever, no sputum production, treated recently with antibiotics. Hold on antibiotics, started diuretics, check 2-D echo and BNP. Chest x-ray showed vascular congestion and and cardiomegaly.  Fluid overload Patient has evidence of fluid overload with lower extremity edema, congested CXR and cardiomegaly. This is likely secondary to acute CHF versus fluid overload from CKD stage IV. Started on diuresis with IV Lasix, increase the dose to 60 twice a day, follow renal function closely.  Essential hypertension, accelerated hypertension Patient presents to hospital with blood pressure of 191/69. This is improved now. Patient has tachycardia, EKG in the ED showed sinus tachycardia. Repeat EKG.  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 Presented with a bicarbonate of 12, placed on bicarbonate drip on admission. Bicarbonate discontinued, patient is on bicarbonate tablets.  CKD stage III Patient is around baseline, discharge last time from the hospital with creatinine of 2.6. Patient admitted to the hospital with creatinine of 2.9 currently is 2.6.  Tachycardia Could be secondary to her respiratory distress/hypoxia versus beta agonist broncho-dilators. Some notes  reported atrial fibrillation, tachycardia appears to be SVT and appropriate sinus tachycardia. Patient is on Cardizem, added low-dose of metoprolol.  Elevated troponin Troponin slightly elevated at 0.13 on admission, likely secondary to fluid overload and a high creatinine. Patient does not have chest pain, check 2-D echocardiogram  Code Status: Full Code Family Communication: Plan discussed with the patient. Disposition Plan: Remains inpatient Diet: Diet Heart Room service appropriate?: Yes; Fluid consistency:: Thin  Consultants:  None  Procedures:  None  Antibiotics:  None   Objective: Filed Vitals:   11/15/14 1056  BP:   Pulse: 75  Temp:   Resp:     Intake/Output Summary (Last 24 hours) at 11/15/14 1139 Last data filed at 11/15/14 0600  Gross per 24 hour  Intake 1340.83 ml  Output   1150 ml  Net 190.83 ml   Filed Weights   11/13/14 1306 11/15/14 0325  Weight: 101.152 kg (223 lb) 101.107 kg (222 lb 14.4 oz)    Exam: General: Alert and awake, oriented x3, not in any acute distress. HEENT: anicteric sclera, pupils reactive to light and accommodation, EOMI CVS: S1-S2 clear, no murmur rubs or gallops Chest: clear to auscultation bilaterally, no wheezing, rales or rhonchi Abdomen: soft nontender, nondistended, normal bowel sounds, no organomegaly Extremities: +1 to +2 edema noted bilaterally Neuro: Cranial nerves II-XII intact, no focal neurological deficits  Data Reviewed: Basic Metabolic Panel:  Recent Labs Lab 11/13/14 0640 11/13/14 1425 11/14/14 0220 11/15/14 0228  NA 142  --  142 139  K 4.8  --  4.6 3.8  CL 119*  --  117* 111  CO2 12*  --  16* 16*  GLUCOSE 128*  --  175* 176*  BUN 84*  --  81* 85*  CREATININE 2.93* 2.85* 2.63* 2.69*  CALCIUM 8.7*  --  8.8* 8.5*  MG  --   --   --  2.2   Liver Function Tests: No results for input(s): AST, ALT, ALKPHOS, BILITOT, PROT, ALBUMIN in the last 168 hours. No results for input(s): LIPASE, AMYLASE in  the last 168 hours. No results for input(s): AMMONIA in the last 168 hours. CBC:  Recent Labs Lab 11/13/14 0640 11/13/14 1425 11/14/14 0220 11/15/14 0228  WBC 8.0 9.5 8.5 9.9  NEUTROABS 5.9  --   --  9.0*  HGB 8.2* 7.7* 7.6* 7.0*  HCT 26.4* 24.7* 23.6* 22.0*  MCV 85.2 85.2 85.5 83.0  PLT 130* 133* 103* 118*   Cardiac Enzymes:  Recent Labs Lab 11/13/14 0640 11/13/14 1425 11/13/14 2022 11/14/14 0220  TROPONINI 0.10* 0.13* 0.14* 0.17*   BNP (last 3 results)  Recent Labs  11/04/14 0810 11/13/14 0640 11/14/14 1601  BNP 222.2* 3091.7* 2334.6*    ProBNP (last 3 results) No results for input(s): PROBNP in the last 8760 hours.  CBG: No results for input(s): GLUCAP in the last 168 hours.  Micro Recent Results (from the past 240 hour(s))  Culture, blood (routine x 2)     Status: None (Preliminary result)   Collection Time: 11/13/14  6:40 AM  Result Value Ref Range Status   Specimen Description BLOOD RIGHT ANTECUBITAL  Final   Special Requests BOTTLES DRAWN AEROBIC AND ANAEROBIC 5CC  Final   Culture NO GROWTH 1 DAY  Final   Report Status PENDING  Incomplete  Culture, blood (routine x 2)     Status: None (Preliminary result)   Collection Time: 11/13/14  6:45 AM  Result Value Ref Range Status   Specimen Description BLOOD LEFT HAND  Final   Special Requests BOTTLES DRAWN AEROBIC ONLY 3CC  Final   Culture NO GROWTH 1 DAY  Final   Report Status PENDING  Incomplete  Urine culture     Status: None   Collection Time: 11/13/14  7:23 AM  Result Value Ref Range Status   Specimen Description URINE, CLEAN CATCH  Final   Special Requests NONE  Final   Culture NO GROWTH 1 DAY  Final   Report Status 11/14/2014 FINAL  Final  MRSA PCR Screening     Status: None   Collection Time: 11/13/14  2:15 PM  Result Value Ref Range Status   MRSA by PCR NEGATIVE NEGATIVE Final    Comment:        The GeneXpert MRSA Assay (FDA approved for NASAL specimens only), is one component of  a comprehensive MRSA colonization surveillance program. It is not intended to diagnose MRSA infection nor to guide or monitor treatment for MRSA infections.      Studies: X-ray Chest Pa And Lateral  11/14/2014   CLINICAL DATA:  Patient with acute respiratory failure.  EXAM: CHEST  2 VIEW  COMPARISON:  Chest radiograph 11/13/2014  FINDINGS: Stable enlarged cardiac and mediastinal contours. Low lung volumes. Bibasilar heterogeneous opacities. No pleural effusion or pneumothorax. Regional skeleton is unremarkable.  IMPRESSION: Cardiomegaly and pulmonary vascular redistribution. Basilar atelectasis.   Electronically Signed   By: Lovey Newcomer M.D.   On: 11/14/2014 09:53    Scheduled Meds: . carbidopa-levodopa  1 tablet Oral TID WC  . ciprofloxacin  400 mg Intravenous 60 min Pre-Op  . citalopram  10 mg Oral Daily  . cycloSPORINE  1 drop Both Eyes BID  . diltiazem  240 mg Oral Daily  . enoxaparin (LOVENOX) injection  30 mg Subcutaneous Q24H  . fluticasone  2 spray Each Nare Daily  . furosemide  20 mg Intravenous Once  . furosemide  40 mg Intravenous BID  . gabapentin  100 mg Oral TID  . guaiFENesin  1,200 mg Oral BID  . isosorbide mononitrate  60 mg Oral Daily  . levalbuterol  0.63 mg Nebulization Q6H  . metoprolol tartrate  25 mg Oral BID  . mometasone-formoterol  2 puff Inhalation BID  . sodium bicarbonate  650 mg Oral BID  . sodium chloride  3 mL Intravenous Q12H  . [START ON 11/16/2014] sodium phosphate  1 enema Rectal Once  . tamsulosin  0.4 mg Oral Daily   Continuous Infusions:       Time spent: 35 minutes    [facility]  [facility] Pager [phone] If 7PM-7AM, please contact night-coverage at [email or web address], password [facility] 11/15/2014, 11:39 AM  LOS: 2 days

## 2014-11-15 NOTE — Progress Notes (Signed)
Patients rhythm currently Sinus Tach with PVCs HR 110s-120s. Notified Harduk NP.

## 2014-11-15 NOTE — Progress Notes (Signed)
HR remains in the 120s and in the 130s at times. Notified Harduk NP and awaiting orders.

## 2014-11-15 NOTE — Progress Notes (Signed)
  Echocardiogram 2D Echocardiogram has been performed.  Diamond Nickel 11/15/2014, 9:49 AM

## 2014-11-15 NOTE — Clinical Social Work Placement (Signed)
   CLINICAL SOCIAL WORK PLACEMENT  NOTE  Date:  11/15/2014  Patient Details  Name: GRANVIL DJORDJEVIC MRN: [id number] Date of Birth: 08/28/1940  Clinical Social Work is seeking post-discharge placement for this patient at the [place] level of care (*CSW will initial, date and re-position this form in  chart as items are completed):  Yes   Patient/family provided with [place] Work Department's list of facilities offering this level of care within the geographic area requested by the patient (or if unable, by the patient's family).  Yes   Patient/family informed of their freedom to choose among providers that offer the needed level of care, that participate in Medicare, Medicaid or managed care program needed by the patient, have an available bed and are willing to accept the patient.  Yes   Patient/family informed of 's ownership interest in [facility] and [facility], as well as of the fact that they are under no obligation to receive care at these facilities.  PASRR submitted to EDS on 11/15/14     PASRR number received on 11/15/14     Existing PASRR number confirmed on       FL2 transmitted to all facilities in geographic area requested by pt/family on 11/15/14     FL2 transmitted to all facilities within larger geographic area on       Patient informed that his/her managed care company has contracts with or will negotiate with certain facilities, including the following:            Patient/family informed of bed offers received.  Patient chooses bed at       Physician recommends and patient chooses bed at      Patient to be transferred to   on  .  Patient to be transferred to facility by       Patient family notified on   of transfer.  Name of family member notified:        PHYSICIAN Please sign FL2     Additional Comment:    _______________________________________________ Berton Mount, [place]

## 2014-11-15 NOTE — Progress Notes (Signed)
Patient HR 160s, EKG shows A-fib. HR frequently fluctuating from 80s-140s. Notified Harduk NP.

## 2014-11-16 ENCOUNTER — Telehealth: Payer: Self-pay | Admitting: *Deleted

## 2014-11-16 ENCOUNTER — Ambulatory Visit: Payer: Medicare Other

## 2014-11-16 LAB — CBC
HCT: 24.4 % — ABNORMAL LOW (ref 39.0–52.0)
HEMOGLOBIN: 8 g/dL — AB (ref 13.0–17.0)
MCH: 27 pg (ref 26.0–34.0)
MCHC: 32.8 g/dL (ref 30.0–36.0)
MCV: 82.4 fL (ref 78.0–100.0)
Platelets: 128 10*3/uL — ABNORMAL LOW (ref 150–400)
RBC: 2.96 MIL/uL — AB (ref 4.22–5.81)
RDW: 15.4 % (ref 11.5–15.5)
WBC: 13.7 10*3/uL — ABNORMAL HIGH (ref 4.0–10.5)

## 2014-11-16 LAB — TYPE AND SCREEN
ABO/RH(D): O POS
Antibody Screen: NEGATIVE
UNIT DIVISION: 0

## 2014-11-16 LAB — BASIC METABOLIC PANEL
ANION GAP: 7 (ref 5–15)
BUN: 92 mg/dL — ABNORMAL HIGH (ref 6–20)
CALCIUM: 8.6 mg/dL — AB (ref 8.9–10.3)
CO2: 19 mmol/L — AB (ref 22–32)
Chloride: 112 mmol/L — ABNORMAL HIGH (ref 101–111)
Creatinine, Ser: 2.74 mg/dL — ABNORMAL HIGH (ref 0.61–1.24)
GFR, EST AFRICAN AMERICAN: 25 mL/min — AB (ref >60)
GFR, EST NON AFRICAN AMERICAN: 21 mL/min — AB (ref >60)
Glucose, Bld: 133 mg/dL — ABNORMAL HIGH (ref 65–99)
POTASSIUM: 4.2 mmol/L (ref 3.5–5.1)
Sodium: 138 mmol/L (ref 135–145)

## 2014-11-16 NOTE — Progress Notes (Signed)
Pt left Against Medical advise. Had spoken with pt and wife  at length that the MD is not recommending him to go home at his current condition. Pt was adamant and very insistent that he wants to go home. Pt verbally abusive with his wife as as this RN was trying to explain to him that the insurance may not cover his medical bills in the hospital . Dr. Hartford Poli aware , [place]  Paper signed.

## 2014-11-16 NOTE — Telephone Encounter (Signed)
Pt was on tcm list pt left hospital without any medical advise...Gerald Gomez

## 2014-11-16 NOTE — Discharge Summary (Signed)
Physician Discharge Summary  Gerald Gomez NHA:[id number] DOB: [birth year] DOA: 11/13/2014  PCP: [name], MD  Admit date: 11/13/2014 Discharge date: 11/16/2014  Time spent: 15 minutes  Recommendations for Outpatient Follow-up:  1. Patient left AGAINST MEDICAL ADVICE.  Discharge Diagnoses:  Principal Problem:   Acute respiratory failure Active Problems:   Hyperlipidemia   Essential hypertension   Acute renal failure superimposed on stage 3 chronic kidney disease   COPD exacerbation   Metabolic acidosis   Discharge Condition: left AMA  Diet recommendation:   Filed Weights   11/13/14 1306 11/15/14 0325  Weight: 101.152 kg (223 lb) 101.107 kg (222 lb 14.4 oz)    History of present illness:  Gerald Gomez is a 74 y.o. male with a past medical history of chronic obstructive pulmonary disease, history of Parkinson's disease, gastroesophageal reflux disease, history of stage III chronic kidney disease who was recently admitted to the medicine service on 11/04/2014 and discharged on 11/07/2014 at which time he presented with a COPD exacerbation. During that hospitalization he was treated with IV steroids, IV antibiotic therapy and DuoNebs. He showed clinical improvement and was discharged on Levaquin and prednisone taper. He reports having progressive deterioration in his respiratory status since discharge. He has become increasingly short of breath having associated wheezing and cough with scant sputum production. He denies chest pain, fevers, chills, nausea, vomiting. A chest x-ray performed in the emergency department did not reveal acute infiltrate.     Hospital Course:   Admitted to the hospital with shortness of breath, had transient lethargy. Patient has anasarca likely from chronic kidney disease, did not want to stay to have any treatment. Also had a run of atrial  fibrillation at night, likely related to sleep apnea and snoring. Patient does not want to stay in the hospital to get any type of diuresis, signed against medical advise.  Procedures:  none  Consultations:  none  Discharge Exam: Filed Vitals:   11/16/14 0947  BP: 140/70  Pulse: 83  Temp:   Resp:    General: Alert and awake, oriented x3, not in any acute distress. HEENT: anicteric sclera, pupils reactive to light and accommodation, EOMI CVS: S1-S2 clear, no murmur rubs or gallops Chest: clear to auscultation bilaterally, no wheezing, rales or rhonchi Abdomen: soft nontender, nondistended, normal bowel sounds, no organomegaly Extremities: no cyanosis, clubbing or edema noted bilaterally Neuro: Cranial nerves II-XII intact, no focal neurological deficits  Discharge Instructions    Discharge Medication List as of 11/16/2014 11:50 AM    START taking these medications   Details  hydrocortisone (ANUSOL-HC) 2.5 % rectal cream Place 1 application rectally 2 (two) times daily., Starting 11/14/2014, Until Discontinued, Normal      CONTINUE these medications which have NOT CHANGED   Details  albuterol (PROVENTIL HFA;VENTOLIN HFA) 108 (90 BASE) MCG/ACT inhaler Inhale 2 puffs into the lungs every 6 (six) hours as needed for wheezing or shortness of breath. Dispense Ventolin HFA please, Starting 06/01/2014, Until Discontinued, Normal    carbidopa-levodopa (SINEMET IR) 25-100 MG per tablet Take 1 tablet by mouth 3 (three) times daily., Starting 07/02/2014, Until Discontinued, Normal    citalopram (CELEXA) 10 MG tablet take 1 tablet by mouth once daily, Normal    cycloSPORINE (RESTASIS) 0.05 % ophthalmic emulsion Place 1 drop into both eyes 2 (two) times daily., Until Discontinued, Historical Med    Diclofenac Sodium 2 % SOLN Apply 1 pump twice daily., Normal    fluticasone (FLONASE) 50 MCG/ACT nasal spray Place  2 sprays into both nostrils daily., Until Discontinued, Historical Med     furosemide (LASIX) 40 MG tablet Take 40 mg by mouth daily. , Starting 08/24/2014, Until Discontinued, Historical Med    gabapentin (NEURONTIN) 100 MG capsule Take 1 capsule (100 mg total) by mouth 3 (three) times daily., Starting 09/09/2014, Until Discontinued, Print    hydrocortisone 2.5 % ointment Apply to affected area BID., Normal    LORazepam (ATIVAN) 1 MG tablet Take 1 tablet (1 mg total) by mouth every 8 (eight) hours as needed for anxiety (30 min before radiation)., Starting 11/01/2014, Until Discontinued, Print    metoprolol tartrate (LOPRESSOR) 25 MG tablet take 1 tablet by mouth twice a day, Normal    morphine (MSIR) 15 MG tablet Take 15 mg by mouth every 4 (four) hours as needed for severe pain. And 30 mins before radiation., Until Discontinued, Historical Med    potassium chloride (K-DUR,KLOR-CON) 10 MEQ tablet Take 10 mEq by mouth daily., Until Discontinued, Historical Med    prochlorperazine (COMPAZINE) 10 MG tablet Take 1 tablet (10 mg total) by mouth every 6 (six) hours as needed for nausea or vomiting., Starting 11/01/2014, Until Discontinued, Normal    tamsulosin (FLOMAX) 0.4 MG CAPS capsule take 1 capsule by mouth once daily, Normal    TOVIAZ 4 MG TB24 tablet Take 4 mg by mouth daily., Starting 05/06/2014, Until Discontinued, Historical Med       Allergies  Allergen Reactions  . Tramadol Nausea Only      The results of significant diagnostics from this hospitalization (including imaging, microbiology, ancillary and laboratory) are listed below for reference.    Significant Diagnostic Studies: X-ray Chest Pa And Lateral  11/14/2014   CLINICAL DATA:  Patient with acute respiratory failure.  EXAM: CHEST  2 VIEW  COMPARISON:  Chest radiograph 11/13/2014  FINDINGS: Stable enlarged cardiac and mediastinal contours. Low lung volumes. Bibasilar heterogeneous opacities. No pleural effusion or pneumothorax. Regional skeleton is unremarkable.  IMPRESSION: Cardiomegaly and  pulmonary vascular redistribution. Basilar atelectasis.   Electronically Signed   By: Lovey Newcomer M.D.   On: 11/14/2014 09:53   Dg Chest 2 View  11/05/2014   CLINICAL DATA:  Sepsis.  EXAM: CHEST  2 VIEW  COMPARISON:  11/04/2014.  09/08/2014.  FINDINGS: Mediastinum and hilar structures are normal. Heart size stable. Left base subsegmental atelectasis and or mild infiltrate. Small left pleural effusion. Elevation of the left hemidiaphragm. No pneumothorax. No acute bony abnormality.  IMPRESSION: 1. Left lower lobe subsegmental atelectasis and or mild infiltrate. Small left pleural effusion. 2. Elevation of the left hemidiaphragm.   Electronically Signed   By: Marcello Moores  Register   On: 11/05/2014 08:09   Dg Chest 2 View  11/04/2014   CLINICAL DATA:  Shortness of breath. Currently undergoing radiation therapy for prostate cancer.  EXAM: CHEST  2 VIEW  COMPARISON:  09/08/2014 and 02/16/2014  FINDINGS: The heart size and mediastinal contours are within normal limits. Both lungs are clear. Previous resection of the distal right clavicle. Arthritic changes of the right shoulder.  IMPRESSION: No active cardiopulmonary disease.   Electronically Signed   By: Lorriane Shire M.D.   On: 11/04/2014 08:47   Dg Chest Port 1 View  11/13/2014   CLINICAL DATA:  Shortness of breath for 1 day. Former smoker. Congestive heart failure. Hypertension.  EXAM: PORTABLE CHEST - 1 VIEW  COMPARISON:  11/05/2014  FINDINGS: Mild elevation of left hemidiaphragm remains stable. No evidence of pulmonary consolidation or edema. Mild cardiomegaly  stable. No definite evidence of pleural effusion.  IMPRESSION: Mild elevation of left hemidiaphragm again noted.  No acute findings   Electronically Signed   By: Earle Gell M.D.   On: 11/13/2014 07:04    Microbiology: Recent Results (from the past 240 hour(s))  Culture, blood (routine x 2)     Status: None (Preliminary result)   Collection Time: 11/13/14  6:40 AM  Result Value Ref Range Status    Specimen Description BLOOD RIGHT ANTECUBITAL  Final   Special Requests BOTTLES DRAWN AEROBIC AND ANAEROBIC 5CC  Final   Culture NO GROWTH 3 DAYS  Final   Report Status PENDING  Incomplete  Culture, blood (routine x 2)     Status: None (Preliminary result)   Collection Time: 11/13/14  6:45 AM  Result Value Ref Range Status   Specimen Description BLOOD LEFT HAND  Final   Special Requests BOTTLES DRAWN AEROBIC ONLY 3CC  Final   Culture NO GROWTH 3 DAYS  Final   Report Status PENDING  Incomplete  Urine culture     Status: None   Collection Time: 11/13/14  7:23 AM  Result Value Ref Range Status   Specimen Description URINE, CLEAN CATCH  Final   Special Requests NONE  Final   Culture NO GROWTH 1 DAY  Final   Report Status 11/14/2014 FINAL  Final  MRSA PCR Screening     Status: None   Collection Time: 11/13/14  2:15 PM  Result Value Ref Range Status   MRSA by PCR NEGATIVE NEGATIVE Final    Comment:        The GeneXpert MRSA Assay (FDA approved for NASAL specimens only), is one component of a comprehensive MRSA colonization surveillance program. It is not intended to diagnose MRSA infection nor to guide or monitor treatment for MRSA infections.      Labs: Basic Metabolic Panel:  Recent Labs Lab 11/13/14 0640 11/13/14 1425 11/14/14 0220 11/15/14 0228 11/16/14 0518  NA 142  --  142 139 138  K 4.8  --  4.6 3.8 4.2  CL 119*  --  117* 111 112*  CO2 12*  --  16* 16* 19*  GLUCOSE 128*  --  175* 176* 133*  BUN 84*  --  81* 85* 92*  CREATININE 2.93* 2.85* 2.63* 2.69* 2.74*  CALCIUM 8.7*  --  8.8* 8.5* 8.6*  MG  --   --   --  2.2  --    Liver Function Tests: No results for input(s): AST, ALT, ALKPHOS, BILITOT, PROT, ALBUMIN in the last 168 hours. No results for input(s): LIPASE, AMYLASE in the last 168 hours. No results for input(s): AMMONIA in the last 168 hours. CBC:  Recent Labs Lab 11/13/14 0640 11/13/14 1425 11/14/14 0220 11/15/14 0228 11/16/14 0518  WBC 8.0 9.5  8.5 9.9 13.7*  NEUTROABS 5.9  --   --  9.0*  --   HGB 8.2* 7.7* 7.6* 7.0* 8.0*  HCT 26.4* 24.7* 23.6* 22.0* 24.4*  MCV 85.2 85.2 85.5 83.0 82.4  PLT 130* 133* 103* 118* 128*   Cardiac Enzymes:  Recent Labs Lab 11/13/14 0640 11/13/14 1425 11/13/14 2022 11/14/14 0220  TROPONINI 0.10* 0.13* 0.14* 0.17*   BNP: BNP (last 3 results)  Recent Labs  11/04/14 0810 11/13/14 0640 11/14/14 1601  BNP 222.2* 3091.7* 2334.6*    ProBNP (last 3 results) No results for input(s): PROBNP in the last 8760 hours.  CBG: No results for input(s): GLUCAP in the last 168 hours.  Signed:  Aletha Allebach A  [facility] 11/16/2014, 3:12 PM

## 2014-11-17 ENCOUNTER — Inpatient Hospital Stay: Payer: Medicare Other | Admitting: Internal Medicine

## 2014-11-17 ENCOUNTER — Ambulatory Visit: Payer: Medicare Other

## 2014-11-17 ENCOUNTER — Encounter (HOSPITAL_COMMUNITY): Payer: Self-pay | Admitting: *Deleted

## 2014-11-17 ENCOUNTER — Telehealth: Payer: Self-pay | Admitting: Radiation Oncology

## 2014-11-17 ENCOUNTER — Inpatient Hospital Stay (HOSPITAL_COMMUNITY)
Admission: EM | Admit: 2014-11-17 | Discharge: 2014-11-24 | DRG: 291 | Disposition: A | Discharge status: Skilled Nursing Facility | Payer: Medicare Other | Attending: Internal Medicine | Admitting: Internal Medicine

## 2014-11-17 ENCOUNTER — Emergency Department (HOSPITAL_COMMUNITY): Payer: Medicare Other

## 2014-11-17 DIAGNOSIS — N390 Urinary tract infection, site not specified: Secondary | ICD-10-CM | POA: Diagnosis present

## 2014-11-17 DIAGNOSIS — I4581 Long QT syndrome: Secondary | ICD-10-CM | POA: Diagnosis present

## 2014-11-17 DIAGNOSIS — E871 Hypo-osmolality and hyponatremia: Secondary | ICD-10-CM | POA: Diagnosis not present

## 2014-11-17 DIAGNOSIS — G20 Parkinson's disease: Secondary | ICD-10-CM | POA: Diagnosis present

## 2014-11-17 DIAGNOSIS — Z79891 Long term (current) use of opiate analgesic: Secondary | ICD-10-CM

## 2014-11-17 DIAGNOSIS — C61 Malignant neoplasm of prostate: Secondary | ICD-10-CM | POA: Diagnosis present

## 2014-11-17 DIAGNOSIS — T380X5A Adverse effect of glucocorticoids and synthetic analogues, initial encounter: Secondary | ICD-10-CM | POA: Diagnosis present

## 2014-11-17 DIAGNOSIS — I4892 Unspecified atrial flutter: Secondary | ICD-10-CM | POA: Diagnosis not present

## 2014-11-17 DIAGNOSIS — I421 Obstructive hypertrophic cardiomyopathy: Secondary | ICD-10-CM | POA: Diagnosis present

## 2014-11-17 DIAGNOSIS — J45909 Unspecified asthma, uncomplicated: Secondary | ICD-10-CM | POA: Diagnosis present

## 2014-11-17 DIAGNOSIS — F419 Anxiety disorder, unspecified: Secondary | ICD-10-CM | POA: Diagnosis present

## 2014-11-17 DIAGNOSIS — E785 Hyperlipidemia, unspecified: Secondary | ICD-10-CM | POA: Diagnosis present

## 2014-11-17 DIAGNOSIS — G8929 Other chronic pain: Secondary | ICD-10-CM | POA: Diagnosis present

## 2014-11-17 DIAGNOSIS — I471 Supraventricular tachycardia: Secondary | ICD-10-CM | POA: Diagnosis not present

## 2014-11-17 DIAGNOSIS — F329 Major depressive disorder, single episode, unspecified: Secondary | ICD-10-CM | POA: Diagnosis present

## 2014-11-17 DIAGNOSIS — B1920 Unspecified viral hepatitis C without hepatic coma: Secondary | ICD-10-CM | POA: Diagnosis present

## 2014-11-17 DIAGNOSIS — E872 Acidosis: Secondary | ICD-10-CM | POA: Diagnosis present

## 2014-11-17 DIAGNOSIS — G3183 Dementia with Lewy bodies: Secondary | ICD-10-CM | POA: Diagnosis present

## 2014-11-17 DIAGNOSIS — D6959 Other secondary thrombocytopenia: Secondary | ICD-10-CM | POA: Diagnosis present

## 2014-11-17 DIAGNOSIS — I480 Paroxysmal atrial fibrillation: Secondary | ICD-10-CM | POA: Diagnosis not present

## 2014-11-17 DIAGNOSIS — Z888 Allergy status to other drugs, medicaments and biological substances status: Secondary | ICD-10-CM

## 2014-11-17 DIAGNOSIS — J449 Chronic obstructive pulmonary disease, unspecified: Secondary | ICD-10-CM | POA: Diagnosis present

## 2014-11-17 DIAGNOSIS — I739 Peripheral vascular disease, unspecified: Secondary | ICD-10-CM | POA: Diagnosis present

## 2014-11-17 DIAGNOSIS — J9621 Acute and chronic respiratory failure with hypoxia: Secondary | ICD-10-CM | POA: Diagnosis present

## 2014-11-17 DIAGNOSIS — Z79899 Other long term (current) drug therapy: Secondary | ICD-10-CM

## 2014-11-17 DIAGNOSIS — M25561 Pain in right knee: Secondary | ICD-10-CM

## 2014-11-17 DIAGNOSIS — R9431 Abnormal electrocardiogram [ECG] [EKG]: Secondary | ICD-10-CM | POA: Diagnosis present

## 2014-11-17 DIAGNOSIS — N3281 Overactive bladder: Secondary | ICD-10-CM | POA: Diagnosis present

## 2014-11-17 DIAGNOSIS — I5033 Acute on chronic diastolic (congestive) heart failure: Secondary | ICD-10-CM | POA: Diagnosis present

## 2014-11-17 DIAGNOSIS — J9601 Acute respiratory failure with hypoxia: Secondary | ICD-10-CM | POA: Diagnosis not present

## 2014-11-17 DIAGNOSIS — Z9181 History of falling: Secondary | ICD-10-CM

## 2014-11-17 DIAGNOSIS — M10061 Idiopathic gout, right knee: Secondary | ICD-10-CM | POA: Diagnosis not present

## 2014-11-17 DIAGNOSIS — Z923 Personal history of irradiation: Secondary | ICD-10-CM

## 2014-11-17 DIAGNOSIS — K219 Gastro-esophageal reflux disease without esophagitis: Secondary | ICD-10-CM | POA: Diagnosis present

## 2014-11-17 DIAGNOSIS — Z87891 Personal history of nicotine dependence: Secondary | ICD-10-CM | POA: Diagnosis not present

## 2014-11-17 DIAGNOSIS — I129 Hypertensive chronic kidney disease with stage 1 through stage 4 chronic kidney disease, or unspecified chronic kidney disease: Secondary | ICD-10-CM | POA: Diagnosis present

## 2014-11-17 DIAGNOSIS — I509 Heart failure, unspecified: Secondary | ICD-10-CM

## 2014-11-17 DIAGNOSIS — D631 Anemia in chronic kidney disease: Secondary | ICD-10-CM | POA: Diagnosis present

## 2014-11-17 DIAGNOSIS — F1021 Alcohol dependence, in remission: Secondary | ICD-10-CM | POA: Diagnosis present

## 2014-11-17 DIAGNOSIS — I10 Essential (primary) hypertension: Secondary | ICD-10-CM | POA: Diagnosis not present

## 2014-11-17 DIAGNOSIS — M545 Low back pain: Secondary | ICD-10-CM | POA: Diagnosis present

## 2014-11-17 DIAGNOSIS — R0602 Shortness of breath: Secondary | ICD-10-CM

## 2014-11-17 DIAGNOSIS — D696 Thrombocytopenia, unspecified: Secondary | ICD-10-CM | POA: Diagnosis present

## 2014-11-17 DIAGNOSIS — I5031 Acute diastolic (congestive) heart failure: Secondary | ICD-10-CM | POA: Diagnosis present

## 2014-11-17 DIAGNOSIS — I110 Hypertensive heart disease with heart failure: Secondary | ICD-10-CM | POA: Diagnosis present

## 2014-11-17 DIAGNOSIS — N184 Chronic kidney disease, stage 4 (severe): Secondary | ICD-10-CM | POA: Diagnosis present

## 2014-11-17 DIAGNOSIS — R410 Disorientation, unspecified: Secondary | ICD-10-CM

## 2014-11-17 DIAGNOSIS — M79604 Pain in right leg: Secondary | ICD-10-CM

## 2014-11-17 DIAGNOSIS — N189 Chronic kidney disease, unspecified: Secondary | ICD-10-CM

## 2014-11-17 LAB — URINALYSIS, ROUTINE W REFLEX MICROSCOPIC
Bilirubin Urine: NEGATIVE
Glucose, UA: NEGATIVE mg/dL
Ketones, ur: NEGATIVE mg/dL
Leukocytes, UA: NEGATIVE
Nitrite: NEGATIVE
Protein, ur: 30 mg/dL — AB
Specific Gravity, Urine: 1.012 (ref 1.005–1.030)
Urobilinogen, UA: 0.2 mg/dL (ref 0.0–1.0)
pH: 5.5 (ref 5.0–8.0)

## 2014-11-17 LAB — CBC WITH DIFFERENTIAL/PLATELET
Basophils Absolute: 0 10*3/uL (ref 0.0–0.1)
Basophils Relative: 0 % (ref 0–1)
Eosinophils Absolute: 0 10*3/uL (ref 0.0–0.7)
Eosinophils Relative: 0 % (ref 0–5)
HCT: 26.1 % — ABNORMAL LOW (ref 39.0–52.0)
Hemoglobin: 8.5 g/dL — ABNORMAL LOW (ref 13.0–17.0)
Lymphocytes Relative: 12 % (ref 12–46)
Lymphs Abs: 1.3 10*3/uL (ref 0.7–4.0)
MCH: 27.1 pg (ref 26.0–34.0)
MCHC: 32.6 g/dL (ref 30.0–36.0)
MCV: 83.1 fL (ref 78.0–100.0)
Monocytes Absolute: 0.5 10*3/uL (ref 0.1–1.0)
Monocytes Relative: 5 % (ref 3–12)
Neutro Abs: 8.9 10*3/uL — ABNORMAL HIGH (ref 1.7–7.7)
Neutrophils Relative %: 83 % — ABNORMAL HIGH (ref 43–77)
Platelets: 105 10*3/uL — ABNORMAL LOW (ref 150–400)
RBC: 3.14 MIL/uL — ABNORMAL LOW (ref 4.22–5.81)
RDW: 15.5 % (ref 11.5–15.5)
WBC: 10.7 10*3/uL — ABNORMAL HIGH (ref 4.0–10.5)

## 2014-11-17 LAB — URINE MICROSCOPIC-ADD ON

## 2014-11-17 LAB — BASIC METABOLIC PANEL
Anion gap: 11 (ref 5–15)
BUN: 94 mg/dL — ABNORMAL HIGH (ref 6–20)
CALCIUM: 8.5 mg/dL — AB (ref 8.9–10.3)
CO2: 17 mmol/L — ABNORMAL LOW (ref 22–32)
CREATININE: 2.61 mg/dL — AB (ref 0.61–1.24)
Chloride: 111 mmol/L (ref 101–111)
GFR calc non Af Amer: 22 mL/min — ABNORMAL LOW (ref >60)
GFR, EST AFRICAN AMERICAN: 26 mL/min — AB (ref >60)
Glucose, Bld: 140 mg/dL — ABNORMAL HIGH (ref 65–99)
Potassium: 4.1 mmol/L (ref 3.5–5.1)
SODIUM: 139 mmol/L (ref 135–145)

## 2014-11-17 LAB — I-STAT ARTERIAL BLOOD GAS, ED
Acid-base deficit: 7 mmol/L — ABNORMAL HIGH (ref 0.0–2.0)
Bicarbonate: 18.8 mEq/L — ABNORMAL LOW (ref 20.0–24.0)
O2 Saturation: 59 %
Patient temperature: 98.6
TCO2: 20 mmol/L (ref 0–100)
pCO2 arterial: 40.2 mmHg (ref 35.0–45.0)
pH, Arterial: 7.278 — ABNORMAL LOW (ref 7.350–7.450)
pO2, Arterial: 35 mmHg — CL (ref 80.0–100.0)

## 2014-11-17 LAB — I-STAT TROPONIN, ED: Troponin i, poc: 0.09 ng/mL (ref 0.00–0.08)

## 2014-11-17 LAB — BRAIN NATRIURETIC PEPTIDE: B NATRIURETIC PEPTIDE 5: 1430 pg/mL — AB (ref 0.0–100.0)

## 2014-11-17 LAB — TROPONIN I: Troponin I: 0.11 ng/mL — ABNORMAL HIGH (ref <0.031)

## 2014-11-17 LAB — MAGNESIUM: MAGNESIUM: 2 mg/dL (ref 1.7–2.4)

## 2014-11-17 LAB — PHOSPHORUS: Phosphorus: 4.8 mg/dL — ABNORMAL HIGH (ref 2.5–4.6)

## 2014-11-17 MED ORDER — SODIUM CHLORIDE 0.9 % IJ SOLN
3.0000 mL | Freq: Two times a day (BID) | INTRAMUSCULAR | Status: DC
  Administered 2014-11-17 (×2): 3 mL via INTRAVENOUS

## 2014-11-17 MED ORDER — MORPHINE SULFATE 15 MG PO TABS
15.0000 mg | ORAL_TABLET | ORAL | Status: DC | PRN
  Administered 2014-11-17 (×2): 15 mg via ORAL
  Filled 2014-11-17 (×3): qty 1

## 2014-11-17 MED ORDER — ALBUTEROL SULFATE (2.5 MG/3ML) 0.083% IN NEBU: 2.5000 mg | INHALATION_SOLUTION | Freq: Four times a day (QID) | RESPIRATORY_TRACT | Status: DC | PRN

## 2014-11-17 MED ORDER — ENOXAPARIN SODIUM 30 MG/0.3ML ~~LOC~~ SOLN
30.0000 mg | SUBCUTANEOUS | Status: DC
  Administered 2014-11-17 – 2014-11-24 (×8): 30 mg via SUBCUTANEOUS
  Filled 2014-11-17 (×6): qty 0.3

## 2014-11-17 MED ORDER — FUROSEMIDE 10 MG/ML IJ SOLN
60.0000 mg | Freq: Once | INTRAMUSCULAR | Status: AC
  Administered 2014-11-17: 60 mg via INTRAVENOUS
  Filled 2014-11-17: qty 6

## 2014-11-17 MED ORDER — POTASSIUM CHLORIDE CRYS ER 20 MEQ PO TBCR
20.0000 meq | EXTENDED_RELEASE_TABLET | Freq: Two times a day (BID) | ORAL | Status: DC
  Administered 2014-11-17 – 2014-11-18 (×4): 20 meq via ORAL
  Filled 2014-11-17 (×6): qty 1

## 2014-11-17 MED ORDER — METOPROLOL TARTRATE 25 MG PO TABS
25.0000 mg | ORAL_TABLET | Freq: Two times a day (BID) | ORAL | Status: DC
  Administered 2014-11-17 – 2014-11-24 (×14): 25 mg via ORAL
  Filled 2014-11-17 (×4): qty 1
  Filled 2014-11-17: qty 2
  Filled 2014-11-17 (×10): qty 1

## 2014-11-17 MED ORDER — TAMSULOSIN HCL 0.4 MG PO CAPS
0.4000 mg | ORAL_CAPSULE | Freq: Every day | ORAL | Status: DC
  Administered 2014-11-17 – 2014-11-24 (×7): 0.4 mg via ORAL
  Filled 2014-11-17 (×8): qty 1

## 2014-11-17 MED ORDER — LORAZEPAM 1 MG PO TABS
1.0000 mg | ORAL_TABLET | Freq: Three times a day (TID) | ORAL | Status: DC | PRN
  Administered 2014-11-17: 1 mg via ORAL
  Filled 2014-11-17: qty 1

## 2014-11-17 MED ORDER — CYCLOSPORINE 0.05 % OP EMUL
1.0000 [drp] | Freq: Two times a day (BID) | OPHTHALMIC | Status: DC
  Administered 2014-11-17 – 2014-11-24 (×10): 1 [drp] via OPHTHALMIC
  Filled 2014-11-17 (×19): qty 1

## 2014-11-17 MED ORDER — SODIUM BICARBONATE 650 MG PO TABS
650.0000 mg | ORAL_TABLET | Freq: Two times a day (BID) | ORAL | Status: DC
  Administered 2014-11-17 – 2014-11-20 (×6): 650 mg via ORAL
  Filled 2014-11-17 (×10): qty 1

## 2014-11-17 MED ORDER — ALBUTEROL SULFATE HFA 108 (90 BASE) MCG/ACT IN AERS: 2.0000 | INHALATION_SPRAY | Freq: Four times a day (QID) | RESPIRATORY_TRACT | Status: DC | PRN

## 2014-11-17 MED ORDER — SODIUM CHLORIDE 0.9 % IV SOLN: 250.0000 mL | INTRAVENOUS | Status: DC | PRN

## 2014-11-17 MED ORDER — ACETAMINOPHEN 325 MG PO TABS
650.0000 mg | ORAL_TABLET | ORAL | Status: DC | PRN
  Administered 2014-11-20 – 2014-11-22 (×3): 650 mg via ORAL
  Filled 2014-11-17 (×3): qty 2

## 2014-11-17 MED ORDER — FESOTERODINE FUMARATE ER 4 MG PO TB24
4.0000 mg | ORAL_TABLET | Freq: Every day | ORAL | Status: DC
  Filled 2014-11-17: qty 1

## 2014-11-17 MED ORDER — FUROSEMIDE 10 MG/ML IJ SOLN: 80.0000 mg | Freq: Two times a day (BID) | INTRAMUSCULAR | Status: DC

## 2014-11-17 MED ORDER — FLUTICASONE PROPIONATE 50 MCG/ACT NA SUSP
2.0000 | Freq: Every day | NASAL | Status: DC
  Administered 2014-11-17 – 2014-11-24 (×8): 2 via NASAL
  Filled 2014-11-17 (×2): qty 16

## 2014-11-17 MED ORDER — CITALOPRAM HYDROBROMIDE 10 MG PO TABS
10.0000 mg | ORAL_TABLET | Freq: Every day | ORAL | Status: DC
  Administered 2014-11-17 – 2014-11-18 (×2): 10 mg via ORAL
  Filled 2014-11-17 (×2): qty 1

## 2014-11-17 MED ORDER — FUROSEMIDE 10 MG/ML IJ SOLN
80.0000 mg | Freq: Two times a day (BID) | INTRAMUSCULAR | Status: DC
  Administered 2014-11-17 – 2014-11-21 (×8): 80 mg via INTRAVENOUS
  Filled 2014-11-17 (×8): qty 8

## 2014-11-17 MED ORDER — GABAPENTIN 100 MG PO CAPS
200.0000 mg | ORAL_CAPSULE | Freq: Three times a day (TID) | ORAL | Status: DC
  Administered 2014-11-17 (×2): 200 mg via ORAL
  Filled 2014-11-17 (×5): qty 2

## 2014-11-17 MED ORDER — CARBIDOPA-LEVODOPA 25-100 MG PO TABS
1.0000 | ORAL_TABLET | Freq: Three times a day (TID) | ORAL | Status: DC
  Administered 2014-11-17 – 2014-11-24 (×18): 1 via ORAL
  Filled 2014-11-17 (×21): qty 1

## 2014-11-17 MED ORDER — SODIUM CHLORIDE 0.9 % IJ SOLN: 3.0000 mL | INTRAMUSCULAR | Status: DC | PRN

## 2014-11-17 NOTE — ED Notes (Signed)
Pt having intermittent episodes of tachycardia lasting a couple of seconds. EDP provider aware.

## 2014-11-17 NOTE — ED Notes (Signed)
RT note: ABG obtained with Venous blood drawn, MD shown results.

## 2014-11-17 NOTE — ED Provider Notes (Signed)
CSN: [id number]     Arrival date & time 11/17/14  0557 History   First MD Initiated Contact with Patient 11/17/14 [phone]     Chief Complaint  Patient presents with  . Back Pain  . Shortness of Breath     (Consider location/radiation/quality/duration/timing/severity/associated sxs/prior Treatment) HPI   Patient is a 74 yo with a PMH of prostate cancer treated with radiation, 41 pack year, asthma/COPD, chronic diastolic CHF, chronic back pain, anxiety and depression, hepatitis C, HLD, HTN, stage III CKD, chronic anemia and thrombocytopenia who presents today with increased SOB and low back pain. He left the hospital AMA on 11/13/14 with a COPD exacerbation.   Today he denies fever, chills, night sweats, increased sputum production, N/V/D. He is tired most of the time. He is unable to take a deep breath due to right sided CP, he was unable to describe the pain.   Past Medical History  Diagnosis Date  . ALLERGIC RHINITIS 03/28/2009  . ANXIETY 03/28/2009  . CHOLELITHIASIS 03/28/2009  . DEPRESSION 03/28/2009  . DIVERTICULOSIS, COLON 03/28/2009  . 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 FATIGUE 03/28/2009  . HYPERLIPIDEMIA 03/28/2009  . HYPERTENSION 03/28/2009  . LOW BACK PAIN, CHRONIC 03/28/2009  . PERIPHERAL VASCULAR DISEASE 03/28/2009  . Personal history of alcoholism 03/28/2009  . THROMBOCYTOPENIA 03/28/2009  . Wheezing 02/07/2010  . CHF (congestive heart failure)   . Impaired glucose tolerance 12/01/2013  . Heart murmur   . Exertional dyspnea 11/04/2014  . History of stomach ulcers   . HEPATITIS C 03/28/2009  . DEGENERATIVE JOINT DISEASE 03/28/2009  . [place] DISEASE, LUMBAR 03/28/2009  . Arthritis     "everywhere"  . Chronic kidney disease, stage 3     /notes 11/04/2014  . Chronic anemia     [name] 11/04/2014  . Thrombocytopenia     chronic/notes 11/04/2014  . Malignant neoplasm prostate     "in treatment for it now" (11/04/2014)  . Pneumonia     Sepsis- suspecting /notes 11/04/2014   Past Surgical  History  Procedure Laterality Date  . Shoulder arthroscopy w/ rotator cuff repair Right 2003  . Cataract extraction w/ intraocular lens  implant, bilateral Bilateral   . Prostate biopsy    . Colonoscopy w/ polypectomy  06/29/2014    polpys proved to be non cancerous done at [facility] History  Problem Relation Age of Onset  . Lung cancer Father   . Stroke Brother   . Breast cancer Daughter    Social History  Substance Use Topics  . Smoking status: Former Smoker -- 1.00 packs/day for 41 years    Types: Cigarettes    Quit date: 03/27/1995  . Smokeless tobacco: Never Used  . Alcohol Use: Yes     Comment: quit drinking 1997    Review of Systems  All other systems negative except as documented in the HPI. All pertinent positives and negatives as reviewed in the HPI.  Allergies  Tramadol  Home Medications   Prior to Admission medications   Medication Sig Start Date End Date Taking? Authorizing Provider  albuterol (PROVENTIL HFA;VENTOLIN HFA) 108 (90 BASE) MCG/ACT inhaler Inhale 2 puffs into the lungs every 6 (six) hours as needed for wheezing or shortness of breath. Dispense Ventolin HFA please 06/01/14   [name], MD  carbidopa-levodopa (SINEMET IR) 25-100 MG per tablet Take 1 tablet by mouth 3 (three) times daily. 07/02/14   [name], DO  citalopram (CELEXA) 10 MG tablet take 1 tablet by  mouth once daily 04/19/14   [name], MD  cycloSPORINE (RESTASIS) 0.05 % ophthalmic emulsion Place 1 drop into both eyes 2 (two) times daily.    Historical Provider, MD  Diclofenac Sodium 2 % SOLN Apply 1 pump twice daily. 02/25/14   Lyndal Pulley, DO  fluticasone (FLONASE) 50 MCG/ACT nasal spray Place 2 sprays into both nostrils daily.    Historical Provider, MD  furosemide (LASIX) 40 MG tablet Take 40 mg by mouth daily.  08/24/14   Historical Provider, MD  gabapentin (NEURONTIN) 100 MG capsule Take 1 capsule (100 mg total) by mouth 3 (three) times daily. Patient taking differently:  Take 200 mg by mouth 3 (three) times daily.  09/09/14   [name], MD  hydrocortisone (ANUSOL-HC) 2.5 % rectal cream Place 1 application rectally 2 (two) times daily. 11/14/14   Tyler Pita, MD  hydrocortisone 2.5 % ointment Apply to affected area BID. 02/15/14   Lyndal Pulley, DO  LORazepam (ATIVAN) 1 MG tablet Take 1 tablet (1 mg total) by mouth every 8 (eight) hours as needed for anxiety (30 min before radiation). 11/01/14   Tyler Pita, MD  metoprolol tartrate (LOPRESSOR) 25 MG tablet take 1 tablet by mouth twice a day 07/21/14   [name], MD  morphine (MSIR) 15 MG tablet Take 15 mg by mouth every 4 (four) hours as needed for severe pain. And 30 mins before radiation.    Historical Provider, MD  potassium chloride (K-DUR,KLOR-CON) 10 MEQ tablet Take 10 mEq by mouth daily.    Historical Provider, MD  prochlorperazine (COMPAZINE) 10 MG tablet Take 1 tablet (10 mg total) by mouth every 6 (six) hours as needed for nausea or vomiting. 11/01/14   Tyler Pita, MD  tamsulosin [facility] 0.4 MG CAPS capsule take 1 capsule by mouth once daily 11/27/13   [name], MD  TOVIAZ 4 MG TB24 tablet Take 4 mg by mouth daily. 05/06/14   Historical Provider, MD   BP 160/112 mmHg  Pulse 79  Temp(Src) 98.1 F (36.7 C) (Oral)  Resp 16  SpO2 94% Physical Exam  Constitutional: He is oriented to person, place, and time.  Patient appears agitated  HENT:  Head: Normocephalic and atraumatic.  Mouth/Throat: Oropharynx is clear and moist.  Eyes: Pupils are equal, round, and reactive to light.  Neck: Normal range of motion.  Cardiovascular: Normal rate, regular rhythm and normal heart sounds.  Exam reveals no gallop and no friction rub.   No murmur heard. Pulmonary/Chest: Effort normal. No respiratory distress. He has no wheezes. He has no rales.  Was not able to auscultate due to increased pain with deep inspiration.   Abdominal: Soft. Bowel sounds are normal. He exhibits distension. There is no  tenderness. There is no rebound.  Edema noted in abdomen   Musculoskeletal: He exhibits edema.  Edema noted in hands, feet, and abdomen   Neurological: He is alert and oriented to person, place, and time. He exhibits normal muscle tone. Coordination normal.  Skin: Skin is warm and dry. No rash noted. No erythema.  Nursing note and vitals reviewed.   ED Course  Procedures (including critical care time) Labs Review Labs Reviewed  BASIC METABOLIC PANEL  BRAIN NATRIURETIC PEPTIDE  CBC WITH DIFFERENTIAL/PLATELET  URINALYSIS, ROUTINE W REFLEX MICROSCOPIC (NOT AT [facility])  TROPONIN I    Imaging Review No results found. I have personally reviewed and evaluated these images and lab results as part of my medical decision-making.  EKG Interpretation   Date/Time:  Wednesday November 17 2014 06:14:16 EDT Ventricular Rate:  95 PR Interval:  174 QRS Duration: 80 QT Interval:  405 QTC Calculation: 509 R Axis:   83 Text Interpretation:  Sinus rhythm Multiple premature complexes, vent   Borderline right axis deviation LVH by voltage No significant change since  last tracing Confirmed by WARD,  DO, KRISTEN [phone]) on 11/17/2014 6:26:10  AM      Patient will be admitted to the hospital for further evaluation and care of his congestive heart failure is given Lasix told her of the plan and all questions were answered and the [facility] who will come see the patient for admission    Dalia Heading, PA-C 11/18/14 [place], DO 11/18/14 1922

## 2014-11-17 NOTE — ED Notes (Signed)
Admitting Provider at the bedside.  

## 2014-11-17 NOTE — ED Notes (Signed)
Pt to ED from home c/o back pain and increased shortness of breath. Pt recently admitted to hospital and left AMA on 11/13/14. Pt c/o chronic back pain and worsening shortness of breath. Pt unsteady when ambulating. Pt a&ox2. Edema noted to abdomen, feet, and hands

## 2014-11-17 NOTE — H&P (Signed)
[facility] History and Physical                                                                                    Gerald Gomez, is a 74 y.o. male  MRN: [id number]   DOB - [birth year]  Admit Date - 11/17/2014  Outpatient Primary MD for the patient is [name], MD  Referring MD: Tyrone Nine / ER   With History of -  Past Medical History  Diagnosis Date  . ALLERGIC RHINITIS 03/28/2009  . ANXIETY 03/28/2009  . CHOLELITHIASIS 03/28/2009  . DEPRESSION 03/28/2009  . DIVERTICULOSIS, COLON 03/28/2009  . 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 FATIGUE 03/28/2009  . HYPERLIPIDEMIA 03/28/2009  . HYPERTENSION 03/28/2009  . LOW BACK PAIN, CHRONIC 03/28/2009  . PERIPHERAL VASCULAR DISEASE 03/28/2009  . Personal history of alcoholism 03/28/2009  . THROMBOCYTOPENIA 03/28/2009  . Wheezing 02/07/2010  . CHF (congestive heart failure)   . Impaired glucose tolerance 12/01/2013  . Heart murmur   . Exertional dyspnea 11/04/2014  . History of stomach ulcers   . HEPATITIS C 03/28/2009  . DEGENERATIVE JOINT DISEASE 03/28/2009  . [place] DISEASE, LUMBAR 03/28/2009  . Arthritis     "everywhere"  . Chronic kidney disease, stage 3     /notes 11/04/2014  . Chronic anemia     [name] 11/04/2014  . Thrombocytopenia     chronic/notes 11/04/2014  . Malignant neoplasm prostate     "in treatment for it now" (11/04/2014)  . Pneumonia     Sepsis- suspecting /notes 11/04/2014      Past Surgical History  Procedure Laterality Date  . Shoulder arthroscopy w/ rotator cuff repair Right 2003  . Cataract extraction w/ intraocular lens  implant, bilateral Bilateral   . Prostate biopsy    . Colonoscopy w/ polypectomy  06/29/2014    polpys proved to be non cancerous done at [facility]    in for   Chief Complaint  Patient presents with  . Back Pain  . Shortness of Breath     HPI This is a 74 year old male patient with chronic kidney disease now at stage IV, known COPD and former tobacco abuse, prior history of alcoholism, known prostate  cancer undergoing radiation therapy since May 2016, hypertension, anemia, chronic low back pain, Parkinson's disease, dyslipidemia, chronic thrombocytopenia, hepatitis C and GERD. Patient returns to the ER with increasing respiratory distress symptoms manifesting as dyspnea on exertion and orthopnea. Patient has been hospitalized twice in August for acute respiratory failure presumed related to COPD as well as diastolic heart failure. Patient left AGAINST MEDICAL ADVICE yesterday. According to the discharge summary patient did not want to stay to have any other treatment. He apparently had had a run of atrial fibrillation during the night as well. He reported to the ER physician increasing shortness of breath and low back pain but denied constitutional symptoms such as fevers chills night sweats or sputum. He has not had any nausea vomiting or diarrhea. He reports excessive fatigue.  In the ER he was afebrile, blood pressure was mildly elevated at 157/65, pulse was 76, room air saturations were 95%. Because the symptoms 2 L nasal cannula oxygen was applied.  In addition he's been given Lasix 60 mg IV 1. Chest x-ray did reveal increased central pulmonary vascular congestion with bilateral perihilar edema. Baseline renal function June 2016 with BUN 69 and creatinine 2.18 with slightly decreased CO2 of 18. Current renal function with BUN 94 and creatinine 2.61 with CO2 of 17 potassium was normal glucose slightly elevated at 140. BNP 1430 noting was elevated at 2334 on 8/21. Troponin is slightly elevated at 0.11 which appears consistent with a shows previous readings and are likely related to his underlying chronic kidney disease. CBC is unremarkable except for slight leukocytosis white count 10,700, stable hemoglobin 8.5, platelets 105,000 which are also chronically low and stable.  In further discussion with primarily the wife, patient has been undergoing radiation treatment for prostate cancer with Dr. [name]  (urologist is Dr. Vira Blanco) since May 2016, patient has had excessive fatigue for several months and has been experiencing dyspnea on exertion for about 6 weeks as well as diffuse edema involving the central body as well as extremities and the face, the wife reports the patient's weight is up about 15 pounds, she primarily noticed him having difficulty breathing while laying down last night   Review of Systems   In addition to the HPI above,  No Fever-chills, myalgias or other constitutional symptoms No Headache, changes with Vision or hearing, new weakness, tingling, numbness in any extremity, No problems swallowing food or Liquids, indigestion/reflux No Chest pain, Cough, palpitations No Abdominal pain, N/V; no melena or hematochezia, no dark tarry stools, Bowel movements are regular, No dysuria, hematuria or flank pain No new skin rashes, lesions, masses or bruises, No new joints pains-aches No recent weight gain or loss No polyuria, polydypsia or polyphagia,  *A full 10 point Review of Systems was done, except as stated above, all other Review of Systems were negative.  Social History Social History  Substance Use Topics  . Smoking status: Former Smoker -- 1.00 packs/day for 41 years    Types: Cigarettes    Quit date: 03/27/1995  . Smokeless tobacco: Never Used  . Alcohol Use: Yes     Comment: quit drinking 1997    Resides at: Private residence  Lives with: Wife  Ambulatory status: With walker or cane; history of gait disturbance secondary to Parkinson's disease and has a history of falls   Family History Family History  Problem Relation Age of Onset  . Lung cancer Father   . Stroke Brother   . Breast cancer Daughter      Prior to Admission medications   Medication Sig Start Date End Date Taking? Authorizing Provider  albuterol (PROVENTIL HFA;VENTOLIN HFA) 108 (90 BASE) MCG/ACT inhaler Inhale 2 puffs into the lungs every 6 (six) hours as needed for wheezing or  shortness of breath. Dispense Ventolin HFA please 06/01/14   [name], MD  carbidopa-levodopa (SINEMET IR) 25-100 MG per tablet Take 1 tablet by mouth 3 (three) times daily. 07/02/14   [name], DO  citalopram (CELEXA) 10 MG tablet take 1 tablet by mouth once daily 04/19/14   [name], MD  cycloSPORINE (RESTASIS) 0.05 % ophthalmic emulsion Place 1 drop into both eyes 2 (two) times daily.    Historical Provider, MD  Diclofenac Sodium 2 % SOLN Apply 1 pump twice daily. 02/25/14   Lyndal Pulley, DO  fluticasone (FLONASE) 50 MCG/ACT nasal spray Place 2 sprays into both nostrils daily.    Historical Provider, MD  furosemide (LASIX) 40 MG tablet Take 40 mg  by mouth daily.  08/24/14   Historical Provider, MD  gabapentin (NEURONTIN) 100 MG capsule Take 1 capsule (100 mg total) by mouth 3 (three) times daily. Patient taking differently: Take 200 mg by mouth 3 (three) times daily.  09/09/14   [name], MD  hydrocortisone (ANUSOL-HC) 2.5 % rectal cream Place 1 application rectally 2 (two) times daily. 11/14/14   Tyler Pita, MD  hydrocortisone 2.5 % ointment Apply to affected area BID. 02/15/14   Lyndal Pulley, DO  LORazepam (ATIVAN) 1 MG tablet Take 1 tablet (1 mg total) by mouth every 8 (eight) hours as needed for anxiety (30 min before radiation). 11/01/14   Tyler Pita, MD  metoprolol tartrate (LOPRESSOR) 25 MG tablet take 1 tablet by mouth twice a day 07/21/14   [name], MD  morphine (MSIR) 15 MG tablet Take 15 mg by mouth every 4 (four) hours as needed for severe pain. And 30 mins before radiation.    Historical Provider, MD  potassium chloride (K-DUR,KLOR-CON) 10 MEQ tablet Take 10 mEq by mouth daily.    Historical Provider, MD  prochlorperazine (COMPAZINE) 10 MG tablet Take 1 tablet (10 mg total) by mouth every 6 (six) hours as needed for nausea or vomiting. 11/01/14   Tyler Pita, MD  tamsulosin [facility]) 0.4 MG CAPS capsule take 1 capsule by mouth once daily 11/27/13   [name], MD  TOVIAZ 4 MG TB24 tablet Take 4 mg by mouth daily. 05/06/14   Historical Provider, MD    Allergies  Allergen Reactions  . Tramadol Nausea Only    Physical Exam  Vitals  Blood pressure 159/51, pulse 78, temperature 98.1 F (36.7 C), temperature source Oral, resp. rate 16, SpO2 99 %.   General:  In no acute respiratory distress, appears chronically ill and fatigued  Psych:  Flat affect, Awake and Oriented X name only. Requested his wife assist with the history stating he could not remember  Neuro:   No focal neurological deficits, CN II through XII intact, Strength 5/5 all 4 extremities, Sensation intact all 4 extremities. No tremulous activity observed  ENT:  Ears and Eyes appear Normal, Conjunctivae clear, PER. Moist oral mucosa without erythema or exudates.  Neck:  Supple, No lymphadenopathy appreciated  Respiratory:  Symmetrical chest wall movement, Good air movement bilaterally, primarily bibasilar crackles no wheezing. 2 L oxygen  Cardiac: Irregular-sinus rhythm with frequent focal PVCs, No Murmurs but did have S3 gallop, re-plus LE edema noted and is associated with generalized anasarca, no JVD, No carotid bruits, peripheral pulses palpable at 2+  Abdomen:  Positive bowel sounds, Soft, Non tender, Non distended,  No masses appreciated, no obvious hepatosplenomegaly  Skin:  No Cyanosis, Normal Skin Turgor, No Skin Rash or Bruise.  Extremities: Symmetrical without obvious trauma or injury,  no effusions.  Data Review  CBC  Recent Labs Lab 11/13/14 0640 11/13/14 1425 11/14/14 0220 11/15/14 0228 11/16/14 0518 11/17/14 0636  WBC 8.0 9.5 8.5 9.9 13.7* 10.7*  HGB 8.2* 7.7* 7.6* 7.0* 8.0* 8.5*  HCT 26.4* 24.7* 23.6* 22.0* 24.4* 26.1*  PLT 130* 133* 103* 118* 128* 105*  MCV 85.2 85.2 85.5 83.0 82.4 83.1  MCH 26.5 26.6 27.5 26.4 27.0 27.1  MCHC 31.1 31.2 32.2 31.8 32.8 32.6  RDW 15.6* 15.5 15.6* 15.4 15.4 15.5  LYMPHSABS 0.9  --   --  0.3*  --  1.3    MONOABS 1.0  --   --  0.6  --  0.5  EOSABS 0.1  --   --  0.0  --  0.0  BASOSABS 0.0  --   --  0.0  --  0.0    Chemistries   Recent Labs Lab 11/13/14 0640 11/13/14 1425 11/14/14 0220 11/15/14 0228 11/16/14 0518 11/17/14 0636  NA 142  --  142 139 138 139  K 4.8  --  4.6 3.8 4.2 4.1  CL 119*  --  117* 111 112* 111  CO2 12*  --  16* 16* 19* 17*  GLUCOSE 128*  --  175* 176* 133* 140*  BUN 84*  --  81* 85* 92* 94*  CREATININE 2.93* 2.85* 2.63* 2.69* 2.74* 2.61*  CALCIUM 8.7*  --  8.8* 8.5* 8.6* 8.5*  MG  --   --   --  2.2  --   --     estimated creatinine clearance is 30.1 mL/min (by C-G formula based on Cr of 2.61).  No results for input(s): TSH, T4TOTAL, T3FREE, THYROIDAB in the last 72 hours.  Invalid input(s): FREET3  Coagulation profile No results for input(s): INR, PROTIME in the last 168 hours.  No results for input(s): DDIMER in the last 72 hours.  Cardiac Enzymes  Recent Labs Lab 11/13/14 2022 11/14/14 0220 11/17/14 0636  TROPONINI 0.14* 0.17* 0.11*    Invalid input(s): POCBNP  Urinalysis    Component Value Date/Time   COLORURINE YELLOW 11/17/2014 0600   APPEARANCEUR CLEAR 11/17/2014 0600   LABSPEC 1.012 11/17/2014 0600   PHURINE 5.5 11/17/2014 0600   GLUCOSEU NEGATIVE 11/17/2014 0600   GLUCOSEU NEGATIVE 12/01/2013 1150   HGBUR MODERATE* 11/17/2014 0600   BILIRUBINUR NEGATIVE 11/17/2014 0600   KETONESUR NEGATIVE 11/17/2014 0600   PROTEINUR 30* 11/17/2014 0600   UROBILINOGEN 0.2 11/17/2014 0600   NITRITE NEGATIVE 11/17/2014 0600   LEUKOCYTESUR NEGATIVE 11/17/2014 0600    Imaging results:   Dg Chest 2 View  11/17/2014   CLINICAL DATA:  Shortness of breath.  EXAM: CHEST  2 VIEW  COMPARISON:  November 14, 2014.  FINDINGS: Stable cardiomediastinal silhouette. No pneumothorax or pleural effusion is noted. Mildly increased central pulmonary vascular congestion is noted with bilateral perihilar at edema. Bony thorax is intact.  IMPRESSION: Mildly  increased central pulmonary vascular congestion with probable mild bilateral perihilar edema.   Electronically Signed   By: Marijo Conception, M.D.   On: 11/17/2014 07:10   X-ray Chest Pa And Lateral  11/14/2014   CLINICAL DATA:  Patient with acute respiratory failure.  EXAM: CHEST  2 VIEW  COMPARISON:  Chest radiograph 11/13/2014  FINDINGS: Stable enlarged cardiac and mediastinal contours. Low lung volumes. Bibasilar heterogeneous opacities. No pleural effusion or pneumothorax. Regional skeleton is unremarkable.  IMPRESSION: Cardiomegaly and pulmonary vascular redistribution. Basilar atelectasis.   Electronically Signed   By: Lovey Newcomer M.D.   On: 11/14/2014 09:53   Dg Chest 2 View  11/05/2014   CLINICAL DATA:  Sepsis.  EXAM: CHEST  2 VIEW  COMPARISON:  11/04/2014.  09/08/2014.  FINDINGS: Mediastinum and hilar structures are normal. Heart size stable. Left base subsegmental atelectasis and or mild infiltrate. Small left pleural effusion. Elevation of the left hemidiaphragm. No pneumothorax. No acute bony abnormality.  IMPRESSION: 1. Left lower lobe subsegmental atelectasis and or mild infiltrate. Small left pleural effusion. 2. Elevation of the left hemidiaphragm.   Electronically Signed   By: Marcello Moores  Register   On: 11/05/2014 08:09   Dg Chest 2 View  11/04/2014   CLINICAL DATA:  Shortness of breath.  Currently undergoing radiation therapy for prostate cancer.  EXAM: CHEST  2 VIEW  COMPARISON:  09/08/2014 and 02/16/2014  FINDINGS: The heart size and mediastinal contours are within normal limits. Both lungs are clear. Previous resection of the distal right clavicle. Arthritic changes of the right shoulder.  IMPRESSION: No active cardiopulmonary disease.   Electronically Signed   By: Lorriane Shire M.D.   On: 11/04/2014 08:47   Dg Chest Port 1 View  11/13/2014   CLINICAL DATA:  Shortness of breath for 1 day. Former smoker. Congestive heart failure. Hypertension.  EXAM: PORTABLE CHEST - 1 VIEW  COMPARISON:   11/05/2014  FINDINGS: Mild elevation of left hemidiaphragm remains stable. No evidence of pulmonary consolidation or edema. Mild cardiomegaly stable. No definite evidence of pleural effusion.  IMPRESSION: Mild elevation of left hemidiaphragm again noted.  No acute findings   Electronically Signed   By: Earle Gell M.D.   On: 11/13/2014 07:04     EKG: (Independently reviewed) sinus rhythm with frequent unifocal PVCs, ventricular rate 95 bpm, no ST segment or T-wave changes that would be concerning for ischemia, QTC slightly prolonged at 509 ms   Assessment & Plan  Principal Problem:   Acute respiratory failure with hypoxia: A) HOCM / Acute diastolic heart failure, NYHA class 2 B) COPD  -Admit to telemetry -Routine CHF admission orders -No clinical signs of COPD exacerbation; suspect previous wheezing more likely cardiac in nature -Begin aggressive IV diuresis with increased Lasix dosage based on decreased GFR: 80 mg every 12 hours with potassium supplementation -Continue preadmission beta blocker-no ACE inhibitor secondary to progressive chronic kidney disease -May benefit from the addition of hydralazine -Suspect a degree of heart failure related to progressive kidney disease -Continue supportive care with oxygen -Continue preadmission MDIs -Daily weight and strict I/O  Active Problems:   CKD (chronic kidney disease), stage IV -Baseline renal function BUN 69 and creatinine 2.18 with current renal function BUN 94 and creatinine 2.61 -Suspect recent increases in BUN likely more related to recent steroid therapy -Appears to have progressed to stage IV and now with associated metabolic acidosis with normal anion gap therefore will begin twice a day oral bicarbonate -Has been receiving radiation therapy to the prostate so check renal ultrasound to rule out potential obstructive causes that may be contributing to issue such as hydronephrosis -No indications at this juncture to pursue  nephrology consultation    QT prolongation -Noted on EKG at admission and associated with frequent PVCs -Had documented episode of paroxysmal atrial fibrillation overnight on 8/23 prior to patient leaving AMA therefore we are continuing telemetry -Avoid offending medications such as Haldol, fluoroquinolones as well as Zofran -Patient was on Toviaz and Compazine prior to admission both of which can precipitate QT prolongation so these have been discontinued    Essential hypertension -Moderate controlled and suspect influenced by volume overload -See above regarding potential need to add alpha-blocker such as hydralazine    Overactive bladder/Malignant neoplasm of prostate -Followed as an outpatient by Dr. [name] (radiation oncology) and Dr. 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 -Continue Sinemet and Celexa -PT/OT evaluations    Hepatitis C -LFTs checked  8/11 showed subtle elevation of AST to 54 which is stable for this patient    Hyperlipidemia -Not on statin therapy prior to admission    Thrombocytopenia -Stable and at baseline currently greater than 100,000    GERD  -Not on suppressive therapy prior to admission    DVT Prophylaxis: Lovenox dose adjusted for low GFR  Family Communication:   Wife at bedside  Code Status:  Full code  Condition:  Stable  Discharge disposition: Anticipate discharge back to home pending improvement in heart failure symptomatology and pending PT/OT evaluation-suspect wife may need some assistance at home with the patient  Time spent in minutes : 60      Mekhi Sonn L. ANP on  11/17/2014 at 10:09 AM  Between 7am to 7pm - Pager - [phone]  After 7pm go to [email or web address] - password [email or web address]  And look for the night coverage person covering me after hours  [facility]

## 2014-11-17 NOTE — ED Notes (Signed)
Informed admitting provider that patient has been having more frequent episodes of tachycardia. Pt not symptomatic at this time. Remains on cardiac monitor.

## 2014-11-17 NOTE — Telephone Encounter (Signed)
Received call from patient's wife, [name]. She reports her husband has been admitted to the hospital for confusion and weakness. Cancelled radiation treatment appointments for Wednesday, Thursday and Friday of this week. Informed [name], RT of this finding. Will inform Dr. [name] of this finding. Will phoned patient's wife back with further direction after speaking with Dr. [name].

## 2014-11-17 NOTE — ED Notes (Addendum)
Pt noted to be tachycardic at 140-150s, lasting approx 20 seconds, then converted to rate of 80s; pt lying on side, resting. Repeat EKG taken and given to EDP. Per EMS, wife reported increased confusion since last admission. Pt states "It's 1983." Alert to self and name. Pt repeatedly stating he doesn't feel good and appears to become agitated when asked questions about care. Abdomen firm, edema noted to lower extremities

## 2014-11-18 ENCOUNTER — Inpatient Hospital Stay (HOSPITAL_COMMUNITY): Payer: Medicare Other

## 2014-11-18 ENCOUNTER — Ambulatory Visit: Payer: Medicare Other

## 2014-11-18 LAB — BLOOD GAS, ARTERIAL
Acid-base deficit: 1.8 mmol/L (ref 0.0–2.0)
Bicarbonate: 22.7 mEq/L (ref 20.0–24.0)
DRAWN BY: 358491
FIO2: 0.21
O2 Saturation: 95.2 %
PATIENT TEMPERATURE: 98.6
PH ART: 7.373 (ref 7.350–7.450)
TCO2: 23.9 mmol/L (ref 0–100)
pCO2 arterial: 39.8 mmHg (ref 35.0–45.0)
pO2, Arterial: 72.9 mmHg — ABNORMAL LOW (ref 80.0–100.0)

## 2014-11-18 LAB — CULTURE, BLOOD (ROUTINE X 2)
Culture: NO GROWTH
Culture: NO GROWTH

## 2014-11-18 LAB — BASIC METABOLIC PANEL
Anion gap: 11 (ref 5–15)
BUN: 77 mg/dL — AB (ref 6–20)
CALCIUM: 9 mg/dL (ref 8.9–10.3)
CO2: 23 mmol/L (ref 22–32)
CREATININE: 2.39 mg/dL — AB (ref 0.61–1.24)
Chloride: 108 mmol/L (ref 101–111)
GFR calc non Af Amer: 25 mL/min — ABNORMAL LOW (ref >60)
GFR, EST AFRICAN AMERICAN: 29 mL/min — AB (ref >60)
Glucose, Bld: 133 mg/dL — ABNORMAL HIGH (ref 65–99)
Potassium: 3.8 mmol/L (ref 3.5–5.1)
SODIUM: 142 mmol/L (ref 135–145)

## 2014-11-18 LAB — AMMONIA: AMMONIA: 16 umol/L (ref 9–35)

## 2014-11-18 LAB — GLUCOSE, CAPILLARY: GLUCOSE-CAPILLARY: 149 mg/dL — AB (ref 65–99)

## 2014-11-18 MED ORDER — DILTIAZEM HCL 60 MG PO TABS
60.0000 mg | ORAL_TABLET | Freq: Four times a day (QID) | ORAL | Status: DC
  Administered 2014-11-18 – 2014-11-20 (×6): 60 mg via ORAL
  Filled 2014-11-18 (×7): qty 1

## 2014-11-18 MED ORDER — DILTIAZEM LOAD VIA INFUSION
10.0000 mg | Freq: Once | INTRAVENOUS | Status: AC
  Administered 2014-11-18: 10 mg via INTRAVENOUS
  Filled 2014-11-18: qty 10

## 2014-11-18 MED ORDER — HALOPERIDOL LACTATE 5 MG/ML IJ SOLN
2.0000 mg | Freq: Four times a day (QID) | INTRAMUSCULAR | Status: DC | PRN
  Administered 2014-11-18 – 2014-11-24 (×9): 2 mg via INTRAVENOUS
  Filled 2014-11-18 (×9): qty 1

## 2014-11-18 MED ORDER — HYDROCODONE-ACETAMINOPHEN 5-325 MG PO TABS
1.0000 | ORAL_TABLET | ORAL | Status: DC | PRN
  Administered 2014-11-18 – 2014-11-24 (×15): 1 via ORAL
  Filled 2014-11-18 (×15): qty 1

## 2014-11-18 MED ORDER — DILTIAZEM HCL 100 MG IV SOLR
5.0000 mg/h | INTRAVENOUS | Status: DC
  Administered 2014-11-18: 10 mg/h via INTRAVENOUS
  Filled 2014-11-18 (×2): qty 100

## 2014-11-18 MED ORDER — HYDRALAZINE HCL 20 MG/ML IJ SOLN
10.0000 mg | Freq: Four times a day (QID) | INTRAMUSCULAR | Status: DC | PRN
  Administered 2014-11-18 – 2014-11-19 (×2): 10 mg via INTRAVENOUS
  Filled 2014-11-18 (×2): qty 1

## 2014-11-18 MED ORDER — MORPHINE SULFATE (PF) 2 MG/ML IV SOLN: 2.0000 mg | INTRAVENOUS | Status: DC | PRN

## 2014-11-18 MED ORDER — CEFTRIAXONE SODIUM 1 G IJ SOLR
1.0000 g | INTRAMUSCULAR | Status: DC
  Administered 2014-11-18 – 2014-11-23 (×6): 1 g via INTRAVENOUS
  Filled 2014-11-18 (×6): qty 10

## 2014-11-18 MED ORDER — TRAMADOL HCL 50 MG PO TABS
50.0000 mg | ORAL_TABLET | Freq: Four times a day (QID) | ORAL | Status: DC | PRN
  Administered 2014-11-18: 50 mg via ORAL
  Filled 2014-11-18: qty 1

## 2014-11-18 NOTE — Progress Notes (Addendum)
Patient does not have IV access. Pulled IV out. IV team attempting to establish IV. Cardizem has been stopped x 1 hour due to IV. Patient has now converted back to SR with PVC's in the 70's but BP still 168/95. Dr. Candiss Norse notified. Will give IV hydralazine once IV established, but discontinue IV cardizem.

## 2014-11-18 NOTE — Progress Notes (Signed)
Bladder scanner done at this time 130 ml noted. Will continue to monitorl

## 2014-11-18 NOTE — Progress Notes (Addendum)
Patient telemetry showing heart rate of 160. Dr. Wallace Keller paged and EKG ordered. EKG showed Afib w/RVR/ Dr. Wallace Keller notified. Pharmacy called to notify cardizem drip is needed.

## 2014-11-18 NOTE — Progress Notes (Addendum)
PT Cancellation Note  Patient Details Name: STACY DESHLER MRN: [id number] DOB: 07-Oct-1940   Cancelled Treatment:    Reason Eval/Treat Not Completed: Other (comment) (pt going OOR for CT) [phone]  Attempted a second time at 1147 and pt HR 160 in bed and not medically appropriate at this time   Melford Aase 11/18/2014, 8:51 AM Elwyn Reach, [place]

## 2014-11-18 NOTE — Evaluation (Signed)
Clinical/Bedside Swallow Evaluation Patient Details  Name: Gerald Gomez MRN: [id number] Date of Birth: November 26, 1940  Today's Date: 11/18/2014 Time: SLP Start Time (ACUTE ONLY): 1529 SLP Stop Time (ACUTE ONLY): 1540 SLP Time Calculation (min) (ACUTE ONLY): 11 min  Past Medical History:  Past Medical History  Diagnosis Date  . ALLERGIC RHINITIS 03/28/2009  . ANXIETY 03/28/2009  . CHOLELITHIASIS 03/28/2009  . DEPRESSION 03/28/2009  . DIVERTICULOSIS, COLON 03/28/2009  . 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 FATIGUE 03/28/2009  . HYPERLIPIDEMIA 03/28/2009  . HYPERTENSION 03/28/2009  . LOW BACK PAIN, CHRONIC 03/28/2009  . PERIPHERAL VASCULAR DISEASE 03/28/2009  . Personal history of alcoholism 03/28/2009  . THROMBOCYTOPENIA 03/28/2009  . Wheezing 02/07/2010  . CHF (congestive heart failure)   . Impaired glucose tolerance 12/01/2013  . Heart murmur   . Exertional dyspnea 11/04/2014  . History of stomach ulcers   . HEPATITIS C 03/28/2009  . DEGENERATIVE JOINT DISEASE 03/28/2009  . [place] DISEASE, LUMBAR 03/28/2009  . Arthritis     "everywhere"  . Chronic kidney disease, stage 3     /notes 11/04/2014  . Chronic anemia     [name] 11/04/2014  . Thrombocytopenia     chronic/notes 11/04/2014  . Malignant neoplasm prostate     "in treatment for it now" (11/04/2014)  . Pneumonia     Sepsis- suspecting /notes 11/04/2014   Past Surgical History:  Past Surgical History  Procedure Laterality Date  . Shoulder arthroscopy w/ rotator cuff repair Right 2003  . Cataract extraction w/ intraocular lens  implant, bilateral Bilateral   . Prostate biopsy    . Colonoscopy w/ polypectomy  06/29/2014    polpys proved to be non cancerous done at [place]   HPI:  74 year old male presents with SOB secondary to fluid overload from CHF. 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    Assessment / Plan / Recommendation Clinical Impression  Pt's oropharyngeal swallow appears WFL, although with current altered mentation he will likely require full supervision for safe intake and sustained attention to meals. Will restart regular textures and thin liquids. No acute SLP needs identified, will sign off.    Aspiration Risk  Mild    Diet Recommendation Age appropriate regular solids;Thin   Medication Administration: Whole meds with liquid Compensations: Minimize environmental distractions;Slow rate;Small sips/bites    Other  Recommendations Oral Care Recommendations: Oral care BID   Follow Up Recommendations   N/A    Pertinent Vitals/Pain n/a    SLP Swallow Goals     Swallow Study Prior Functional Status       General Other Pertinent Information: 74 year old male presents with SOB secondary to fluid overload from CHF. 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  Type of Study: Bedside swallow evaluation Previous Swallow Assessment: none in chart Diet Prior to this Study: NPO Temperature Spikes Noted: No Respiratory Status: Room air History of Recent Intubation: No Behavior/Cognition: Alert;Confused;Requires cueing Oral Cavity - Dentition: Adequate natural dentition/normal for age Self-Feeding Abilities: Able to feed self Patient Positioning: Upright in bed Baseline Vocal Quality: Normal    Oral/Motor/Sensory Function Overall  Oral Motor/Sensory Function: Appears within functional limits for tasks assessed   Ice Chips Ice chips: Not tested   Thin Liquid Thin Liquid: Within functional  limits Presentation: Cup;Self Fed;Straw    Nectar Thick Nectar Thick Liquid: Not tested   Honey Thick Honey Thick Liquid: Not tested   Puree Puree: Within functional limits Presentation: Spoon   Solid   Solid: Within functional limits      Germain Osgood, M.A. CCC-SLP [phone]  Germain Osgood 11/18/2014,3:52 PM

## 2014-11-18 NOTE — Progress Notes (Addendum)
Patient Demographics:    Gerald Gomez, is a 74 y.o. male, DOB - March 13, 1941, CWC:[id number]  Admit date - 11/17/2014   Admitting Physician No admitting provider for patient encounter.  Outpatient Primary MD for the patient is [name], MD  LOS - 1   Chief Complaint  Patient presents with  . Back Pain  . Shortness of Breath        Subjective:    Gerald Gomez today has, No headache, No chest pain, No abdominal pain - No Nausea, No new weakness tingling or numbness, No Cough - SOB. Unreliable historian. [place] of some back pain.   Assessment  & Plan :    1. Acute on chronic hypoxic respiratory failure due to fluid overload from acute on chronic diastolic CHF EF 76%, patient signed out AMA one day before this hospitalization. Hypoxia improved on IV Lasix which will be continued, continue beta blocker. Monitor salt intake and fluid intake. Monitor weight and BMP.  Filed Weights   11/18/14 0604  Weight: 97.523 kg (215 lb)    2. CK D stage IV -a sliding creatinine is close to 2.5. Improving with diuresis continue Lasix and monitor. Renal ultrasound done upon admission is stable.   3. History of Parkinson's with Parkinson's dementia. Continue Sinemet, He has some delirium at this time, CT head nonacute, ABG stable, obtain ammonia level, avoid narcotics, benzodiazepine's and monitor closely. 4 aspiration precautions. Speech to evaluate. Possible UTI placed on Rocephin and we'll monitor.   4. Essential hypertension. Stable continue on beta blocker, Lasix and monitor.   5. Anemia of chronic disease. No acute issues supportive care.   6. History of prostate cancer undergoing radiation treatments under the care of Dr. [name] and [name]. Outpatient follow-up.   7. Chronic low back pain Will place  on tramadol and avoid narcotics due to delirium.   8. Chronic thrombocytopenia likely due to history of hepatitis C. Monitor levels. No acute issue   9. History of hep C. Outpatient follow-up with GI and PCP.   10. Possible UTI. Placed on Rocephin monitor.    Code Status : Full  Family Communication  : Called and left message for the wife on listed phone # on 11/18/2014 at 10:45 AM  Disposition Plan  : To be decided  Consults  :  None  Procedures  :   CT head nonacute  DVT Prophylaxis  :  Lovenox    Lab Results  Component Value Date   PLT 105* 11/17/2014    Inpatient Medications  Scheduled Meds: . carbidopa-levodopa  1 tablet Oral TID WC  . cefTRIAXone (ROCEPHIN)  IV  1 g Intravenous Q24H  . citalopram  10 mg Oral Daily  . cycloSPORINE  1 drop Both Eyes BID  . enoxaparin (LOVENOX) injection  30 mg Subcutaneous Q24H  . fluticasone  2 spray Each Nare Daily  . furosemide  80 mg Intravenous BID  . gabapentin  200 mg Oral TID  . metoprolol tartrate  25 mg Oral BID  . potassium chloride  20 mEq Oral BID  . sodium bicarbonate  650 mg Oral BID  . sodium chloride  3 mL Intravenous Q12H  . tamsulosin  0.4 mg Oral Daily   Continuous Infusions:  PRN  Meds:.sodium chloride, acetaminophen, albuterol, LORazepam, morphine, sodium chloride  Antibiotics  :    Anti-infectives    Start     Dose/Rate Route Frequency Ordered Stop   11/18/14 0815  cefTRIAXone (ROCEPHIN) 1 g in dextrose 5 % 50 mL IVPB     1 g 100 mL/hr over 30 Minutes Intravenous Every 24 hours 11/18/14 0805          Objective:   Filed Vitals:   11/17/14 2147 11/18/14 0211 11/18/14 0604 11/18/14 0817  BP: 181/91 117/87 140/90   Pulse: 83 80 82 86  Temp: 98.1 F (36.7 C) 98.3 F (36.8 C) 98.6 F (37 C) 98.6 F (37 C)  TempSrc: Oral Oral Oral Oral  Resp: 16 18 18 18   Weight:   97.523 kg (215 lb)   SpO2: 96% 94% 100% 95%    Wt Readings from Last 3 Encounters:  11/18/14 97.523 kg (215 lb)  11/15/14  101.107 kg (222 lb 14.4 oz)  11/07/14 98.7 kg (217 lb 9.5 oz)     Intake/Output Summary (Last 24 hours) at 11/18/14 1047 Last data filed at 11/18/14 1004  Gross per 24 hour  Intake    360 ml  Output   3250 ml  Net  -2890 ml     Physical Exam  Awake , pleasantly confused, No new F.N deficits, Normal affect .AT,PERRAL Supple Neck,No JVD, No cervical lymphadenopathy appriciated.  Symmetrical Chest wall movement, Good air movement bilaterally, CTAB RRR,No Gallops,Rubs or new Murmurs, No Parasternal Heave +ve B.Sounds, Abd Soft, No tenderness, No organomegaly appriciated, No rebound - guarding or rigidity. No Cyanosis, Clubbing or edema, No new Rash or bruise      Data Review:   Micro Results Recent Results (from the past 240 hour(s))  Culture, blood (routine x 2)     Status: None (Preliminary result)   Collection Time: 11/13/14  6:40 AM  Result Value Ref Range Status   Specimen Description BLOOD RIGHT ANTECUBITAL  Final   Special Requests BOTTLES DRAWN AEROBIC AND ANAEROBIC 5CC  Final   Culture NO GROWTH 4 DAYS  Final   Report Status PENDING  Incomplete  Culture, blood (routine x 2)     Status: None (Preliminary result)   Collection Time: 11/13/14  6:45 AM  Result Value Ref Range Status   Specimen Description BLOOD LEFT HAND  Final   Special Requests BOTTLES DRAWN AEROBIC ONLY 3CC  Final   Culture NO GROWTH 4 DAYS  Final   Report Status PENDING  Incomplete  Urine culture     Status: None   Collection Time: 11/13/14  7:23 AM  Result Value Ref Range Status   Specimen Description URINE, CLEAN CATCH  Final   Special Requests NONE  Final   Culture NO GROWTH 1 DAY  Final   Report Status 11/14/2014 FINAL  Final  MRSA PCR Screening     Status: None   Collection Time: 11/13/14  2:15 PM  Result Value Ref Range Status   MRSA by PCR NEGATIVE NEGATIVE Final    Comment:        The GeneXpert MRSA Assay (FDA approved for NASAL specimens only), is one component of  a comprehensive MRSA colonization surveillance program. It is not intended to diagnose MRSA infection nor to guide or monitor treatment for MRSA infections.     Radiology Reports Dg Chest 2 View  11/17/2014   CLINICAL DATA:  Shortness of breath.  EXAM: CHEST  2 VIEW  COMPARISON:  November 14, 2014.  FINDINGS: Stable cardiomediastinal silhouette. No pneumothorax or pleural effusion is noted. Mildly increased central pulmonary vascular congestion is noted with bilateral perihilar at edema. Bony thorax is intact.  IMPRESSION: Mildly increased central pulmonary vascular congestion with probable mild bilateral perihilar edema.   Electronically Signed   By: Marijo Conception, M.D.   On: 11/17/2014 07:10   X-ray Chest Pa And Lateral  11/14/2014   CLINICAL DATA:  Patient with acute respiratory failure.  EXAM: CHEST  2 VIEW  COMPARISON:  Chest radiograph 11/13/2014  FINDINGS: Stable enlarged cardiac and mediastinal contours. Low lung volumes. Bibasilar heterogeneous opacities. No pleural effusion or pneumothorax. Regional skeleton is unremarkable.  IMPRESSION: Cardiomegaly and pulmonary vascular redistribution. Basilar atelectasis.   Electronically Signed   By: Lovey Newcomer M.D.   On: 11/14/2014 09:53   Dg Chest 2 View  11/05/2014   CLINICAL DATA:  Sepsis.  EXAM: CHEST  2 VIEW  COMPARISON:  11/04/2014.  09/08/2014.  FINDINGS: Mediastinum and hilar structures are normal. Heart size stable. Left base subsegmental atelectasis and or mild infiltrate. Small left pleural effusion. Elevation of the left hemidiaphragm. No pneumothorax. No acute bony abnormality.  IMPRESSION: 1. Left lower lobe subsegmental atelectasis and or mild infiltrate. Small left pleural effusion. 2. Elevation of the left hemidiaphragm.   Electronically Signed   By: Marcello Moores  Register   On: 11/05/2014 08:09   Dg Chest 2 View  11/04/2014   CLINICAL DATA:  Shortness of breath. Currently undergoing radiation therapy for prostate cancer.  EXAM: CHEST   2 VIEW  COMPARISON:  09/08/2014 and 02/16/2014  FINDINGS: The heart size and mediastinal contours are within normal limits. Both lungs are clear. Previous resection of the distal right clavicle. Arthritic changes of the right shoulder.  IMPRESSION: No active cardiopulmonary disease.   Electronically Signed   By: Lorriane Shire M.D.   On: 11/04/2014 08:47   Ct Head Wo Contrast  11/18/2014   CLINICAL DATA:  Confusion.  Altered mental status.  EXAM: CT HEAD WITHOUT CONTRAST  TECHNIQUE: Contiguous axial images were obtained from the base of the skull through the vertex without intravenous contrast.  COMPARISON:  CT scan of September 08, 2014.  FINDINGS: Bony calvarium appears intact. No mass effect or midline shift is noted. Ventricular size is within normal limits. There is no evidence of mass lesion, hemorrhage or acute infarction.  IMPRESSION: Normal head CT.   Electronically Signed   By: Marijo Conception, M.D.   On: 11/18/2014 09:34   US Renal  11/17/2014   CLINICAL DATA:  Chronic renal insufficiency stage IV  EXAM: RENAL / URINARY TRACT ULTRASOUND COMPLETE  COMPARISON:  Abdominal ultrasound dated March 25, 2013  FINDINGS: Right Kidney:  Length: 10.9 cm. The renal cortical echotexture is increased and is equal to or exceeds that of the adjacent liver. There is no parenchymal mass or hydronephrosis.  Left Kidney:  Length: 11.1 cm the renal cortical echotexture is increased. There is no mass or hydronephrosis. Echogenicity within normal limits. No mass or hydronephrosis visualized.  Bladder:  Appears normal for degree of bladder distention. The urinary bladder wall measures approximately 5.5 mm.  IMPRESSION: Increased renal cortical echotexture consistent with medical renal disease. There is no hydronephrosis.   Electronically Signed   By: David  Martinique M.D.   On: 11/17/2014 10:30   Dg Chest Port 1 View  11/13/2014   CLINICAL DATA:  Shortness of breath for 1 day. Former smoker. Congestive heart failure.  Hypertension.  EXAM: PORTABLE CHEST -  1 VIEW  COMPARISON:  11/05/2014  FINDINGS: Mild elevation of left hemidiaphragm remains stable. No evidence of pulmonary consolidation or edema. Mild cardiomegaly stable. No definite evidence of pleural effusion.  IMPRESSION: Mild elevation of left hemidiaphragm again noted.  No acute findings   Electronically Signed   By: Earle Gell M.D.   On: 11/13/2014 07:04     CBC  Recent Labs Lab 11/13/14 0640 11/13/14 1425 11/14/14 0220 11/15/14 0228 11/16/14 0518 11/17/14 0636  WBC 8.0 9.5 8.5 9.9 13.7* 10.7*  HGB 8.2* 7.7* 7.6* 7.0* 8.0* 8.5*  HCT 26.4* 24.7* 23.6* 22.0* 24.4* 26.1*  PLT 130* 133* 103* 118* 128* 105*  MCV 85.2 85.2 85.5 83.0 82.4 83.1  MCH 26.5 26.6 27.5 26.4 27.0 27.1  MCHC 31.1 31.2 32.2 31.8 32.8 32.6  RDW 15.6* 15.5 15.6* 15.4 15.4 15.5  LYMPHSABS 0.9  --   --  0.3*  --  1.3  MONOABS 1.0  --   --  0.6  --  0.5  EOSABS 0.1  --   --  0.0  --  0.0  BASOSABS 0.0  --   --  0.0  --  0.0    Chemistries   Recent Labs Lab 11/14/14 0220 11/15/14 0228 11/16/14 0518 11/17/14 0636 11/17/14 1011 11/18/14 0610  NA 142 139 138 139  --  142  K 4.6 3.8 4.2 4.1  --  3.8  CL 117* 111 112* 111  --  108  CO2 16* 16* 19* 17*  --  23  GLUCOSE 175* 176* 133* 140*  --  133*  BUN 81* 85* 92* 94*  --  77*  CREATININE 2.63* 2.69* 2.74* 2.61*  --  2.39*  CALCIUM 8.8* 8.5* 8.6* 8.5*  --  9.0  MG  --  2.2  --   --  2.0  --    ------------------------------------------------------------------------------------------------------------------ estimated creatinine clearance is 32.3 mL/min (by C-G formula based on Cr of 2.39). ------------------------------------------------------------------------------------------------------------------ No results for input(s): HGBA1C in the last 72 hours. ------------------------------------------------------------------------------------------------------------------ No results for input(s): CHOL, HDL, LDLCALC,  TRIG, CHOLHDL, LDLDIRECT in the last 72 hours. ------------------------------------------------------------------------------------------------------------------ No results for input(s): TSH, T4TOTAL, T3FREE, THYROIDAB in the last 72 hours.  Invalid input(s): FREET3 ------------------------------------------------------------------------------------------------------------------ No results for input(s): VITAMINB12, FOLATE, FERRITIN, TIBC, IRON, RETICCTPCT in the last 72 hours.  Coagulation profile No results for input(s): INR, PROTIME in the last 168 hours.  No results for input(s): DDIMER in the last 72 hours.  Cardiac Enzymes  Recent Labs Lab 11/13/14 2022 11/14/14 0220 11/17/14 0636  TROPONINI 0.14* 0.17* 0.11*   ------------------------------------------------------------------------------------------------------------------ Invalid input(s): POCBNP   Time Spent in minutes  35   Sharnell Knight K M.D on 11/18/2014 at 10:47 AM  Between 7am to 7pm - Pager - [phone]  After 7pm go to [email or web address] - password [facility]  [facility] -  Office  [phone]

## 2014-11-19 ENCOUNTER — Ambulatory Visit: Payer: Medicare Other | Admitting: Radiation Oncology

## 2014-11-19 ENCOUNTER — Ambulatory Visit
Admit: 2014-11-19 | Discharge: 2014-11-19 | Disposition: A | Discharge status: Home / Self Care | Payer: Medicare Other | Attending: Radiation Oncology | Admitting: Radiation Oncology

## 2014-11-19 ENCOUNTER — Ambulatory Visit: Payer: Medicare Other

## 2014-11-19 ENCOUNTER — Inpatient Hospital Stay (HOSPITAL_COMMUNITY): Payer: Medicare Other

## 2014-11-19 DIAGNOSIS — C61 Malignant neoplasm of prostate: Secondary | ICD-10-CM

## 2014-11-19 LAB — CBC
HCT: 29 % — ABNORMAL LOW (ref 39.0–52.0)
HEMOGLOBIN: 9.4 g/dL — AB (ref 13.0–17.0)
MCH: 26.7 pg (ref 26.0–34.0)
MCHC: 32.4 g/dL (ref 30.0–36.0)
MCV: 82.4 fL (ref 78.0–100.0)
Platelets: 107 10*3/uL — ABNORMAL LOW (ref 150–400)
RBC: 3.52 MIL/uL — ABNORMAL LOW (ref 4.22–5.81)
RDW: 15 % (ref 11.5–15.5)
WBC: 12.3 10*3/uL — ABNORMAL HIGH (ref 4.0–10.5)

## 2014-11-19 LAB — BASIC METABOLIC PANEL
Anion gap: 13 (ref 5–15)
BUN: 66 mg/dL — AB (ref 6–20)
CALCIUM: 8.9 mg/dL (ref 8.9–10.3)
CO2: 24 mmol/L (ref 22–32)
CREATININE: 2.35 mg/dL — AB (ref 0.61–1.24)
Chloride: 103 mmol/L (ref 101–111)
GFR calc Af Amer: 29 mL/min — ABNORMAL LOW (ref >60)
GFR calc non Af Amer: 25 mL/min — ABNORMAL LOW (ref >60)
GLUCOSE: 163 mg/dL — AB (ref 65–99)
Potassium: 3.5 mmol/L (ref 3.5–5.1)
Sodium: 140 mmol/L (ref 135–145)

## 2014-11-19 MED ORDER — DILTIAZEM LOAD VIA INFUSION
10.0000 mg | INTRAVENOUS | Status: DC | PRN
Start: 2014-11-19 — End: 2014-11-19

## 2014-11-19 MED ORDER — METOPROLOL TARTRATE 1 MG/ML IV SOLN
5.0000 mg | Freq: Once | INTRAVENOUS | Status: AC
  Administered 2014-11-19: 5 mg via INTRAVENOUS
  Filled 2014-11-19: qty 5

## 2014-11-19 MED ORDER — POTASSIUM CHLORIDE 20 MEQ/15ML (10%) PO SOLN
40.0000 meq | Freq: Every day | ORAL | Status: DC
  Administered 2014-11-19: 40 meq via ORAL
  Filled 2014-11-19: qty 30

## 2014-11-19 MED ORDER — FUROSEMIDE 10 MG/ML IJ SOLN
INTRAMUSCULAR | Status: AC
  Filled 2014-11-19: qty 8

## 2014-11-19 MED ORDER — LORAZEPAM 2 MG/ML IJ SOLN
1.0000 mg | Freq: Once | INTRAMUSCULAR | Status: AC
  Administered 2014-11-19: 1 mg via INTRAVENOUS
  Filled 2014-11-19: qty 1

## 2014-11-19 MED ORDER — DILTIAZEM HCL 25 MG/5ML IV SOLN
10.0000 mg | INTRAVENOUS | Status: DC | PRN
  Filled 2014-11-19 (×4): qty 5

## 2014-11-19 NOTE — Evaluation (Signed)
Physical Therapy Evaluation Patient Details Name: Gerald Gomez MRN: [id number] DOB: 23-Sep-1940 Today's Date: 11/19/2014   History of Present Illness  74 year old male patient with chronic kidney disease now at stage IV, known COPD and former tobacco abuse, prior history of alcoholism, known prostate cancer undergoing radiation therapy since May 2016, hypertension, anemia, chronic low back pain, Parkinson's disease, dyslipidemia, chronic thrombocytopenia, hepatitis C and GERD. Patient returns to the ER with increasing respiratory distress symptoms manifesting as dyspnea on exertion and orthopnea. Patient has been hospitalized twice in August for acute respiratory failure presumed related to COPD as well as diastolic heart failure. Patient left AGAINST MEDICAL ADVICE 8/23. According to the discharge summary patient did not want to stay to have any other treatment. He had a run of atrial fibrillation during the night as well. He reported to the ER physician increasing shortness of breath and low back pain.   Clinical Impression  Patient in bed, wife at bedside. Patient extremely confused, agitated, and impulsive throughout session. He fixated on multiple phrases, such as "just let me up," and was frustrated by his restraints. Patient was able to transfer as described below. Further mobility was deferred due to HR response and agitations. See Vitals below to see pertinent vitals during session. Patient will benefit from continued PT to progress his mobility when he becomes less agitated.    Follow Up Recommendations SNF;Supervision/Assistance - 24 hour    Equipment Recommendations  None recommended by PT    Recommendations for Other Services       Precautions / Restrictions Precautions Precautions: Other (comment) (Restraints) Precaution Comments: Bil mitts, trunk restraint Restrictions Weight Bearing Restrictions: No      Mobility  Bed Mobility Overal bed mobility: Needs Assistance;+ 2  for safety/equipment Bed Mobility: Supine to Sit;Rolling;Sit to Supine Rolling: Modified independent (Device/Increase time);+2 for safety/equipment (Patient rolling uncontrollably in bed throughout session.)   Supine to sit: Mod assist;+2 for physical assistance;HOB elevated Sit to supine: Min assist;+2 for safety/equipment   General bed mobility comments: Patient very confused, requires multiple verbal and tactile cues to perform bed mobility. Repeatedly saying "just get me up" even after he was sitting EOB. Patient c/o pain when moving R LE. Required max verbal cues to lay down at the head of the bed.  Transfers Overall transfer level: Needs assistance Equipment used: Rolling walker (2 wheeled);2 person hand held assist Transfers: Sit to/from Stand Sit to Stand: Max assist;+2 physical assistance;From elevated surface         General transfer comment: Patient able to stand for ~10 seconds to move pad up in bed. Patient very weak on his feet. Further transfers were deferred due to increased HR as well as agittation and confusion.  Ambulation/Gait                Stairs            Wheelchair Mobility    Modified Rankin (Stroke Patients Only)       Balance Overall balance assessment: Needs assistance Sitting-balance support: Bilateral upper extremity supported;Feet supported Sitting balance-Leahy Scale: Poor     Standing balance support: Bilateral upper extremity supported Standing balance-Leahy Scale: Poor Standing balance comment: Required RW and HHA x2 for standing ~10 seconds.                             Pertinent Vitals/Pain Pain Assessment: Faces Faces Pain Scale: Hurts whole lot Pain Location: Back, legs, "whole body"  Pain Descriptors / Indicators: Aching;Discomfort;Grimacing Pain Intervention(s): Limited activity within patient's tolerance;Monitored during session;Repositioned  HR during bed mobility: 120-135 bpm, fluctuating HR during  standing: 128 bpm    Home Living Family/patient expects to be discharged to:: Private residence Living Arrangements: Spouse/significant other Available Help at Discharge: Family;Available 24 hours/day Type of Home: House Home Access: Stairs to enter Entrance Stairs-Rails: None Entrance Stairs-Number of Steps: 2 Home Layout: One level Home Equipment: Walker - 2 wheels;Shower seat;Toilet riser;Grab bars - tub/shower;Grab bars - toilet      Prior Function Level of Independence: Independent with assistive device(s)               Hand Dominance        Extremity/Trunk Assessment               Lower Extremity Assessment: Generalized weakness         Communication   Communication: Expressive difficulties  Cognition Arousal/Alertness: Awake/alert Behavior During Therapy: Restless;Agitated;Impulsive;Anxious Overall Cognitive Status: Impaired/Different from baseline Area of Impairment: Following commands;Safety/judgement;Problem solving;Attention   Current Attention Level: Focused   Following Commands: Follows one step commands inconsistently Safety/Judgement: Decreased awareness of safety;Decreased awareness of deficits   Problem Solving: Slow processing;Decreased initiation;Difficulty sequencing;Requires verbal cues;Requires tactile cues General Comments: Patient thoroughly confused. Fixated on "just get me up!" although he was already sitting EOB. Wife reports that he has been confused before but nothing like this. Patient very agitated by restraints, complaining of pain in "his whole body."    General Comments      Exercises        Assessment/Plan    PT Assessment Patient needs continued PT services  PT Diagnosis Generalized weakness;Altered mental status   PT Problem List Decreased strength;Decreased activity tolerance;Decreased balance;Decreased mobility;Decreased cognition;Decreased safety awareness;Decreased knowledge of precautions;Cardiopulmonary  status limiting activity  PT Treatment Interventions DME instruction;Gait training;Stair training;Functional mobility training;Therapeutic activities;Therapeutic exercise;Neuromuscular re-education;Cognitive remediation;Patient/family education   PT Goals (Current goals can be found in the Care Plan section) Acute Rehab PT Goals Patient Stated Goal: Get better PT Goal Formulation: With family Time For Goal Achievement: 12/03/14 Potential to Achieve Goals: Fair    Frequency Min 2X/week   Barriers to discharge Decreased caregiver support Wife states she is not able to provide care for patient in his current state of health. She repeatedly expressed concerns about "what am I going to do next?"    Co-evaluation               End of Session   Activity Tolerance: Treatment limited secondary to agitation;Treatment limited secondary to medical complications (Comment) (Increased HR response to agitation and movement) Patient left: in bed;with bed alarm set;with family/visitor present;with restraints reapplied Nurse Communication: Mobility status         Time: 1610-9604 PT Time Calculation (min) (ACUTE ONLY): 28 min   Charges:   PT Evaluation $Initial PT Evaluation Tier I: 1 Procedure PT Treatments $Therapeutic Activity: 8-22 mins   PT G CodesRoanna Epley, SPT [phone] 11/19/2014, 1:24 PM  I have read, reviewed and agree with student's note.   [place] [phone] (pager)

## 2014-11-19 NOTE — Progress Notes (Signed)
OT Cancellation Note  Patient Details Name: Gerald Gomez MRN: [id number] DOB: [birth year]   Cancelled Treatment:    Reason Eval/Treat Not Completed: Pain limiting ability to participate.  Pt agitated and restless not able to participate in OT eval.  Will re-try tomorrow if time permits.  Shayana Hornstein,Lewi OTR/L 11/19/2014, 4:41 PM

## 2014-11-19 NOTE — Progress Notes (Signed)
Patient Demographics:    Gerald Gomez, is a 74 y.o. male, DOB - 04-02-1940, TTS:[id number]  Admit date - 11/17/2014   Admitting Physician No admitting provider for patient encounter.  Outpatient Primary MD for the patient is Gerald Cower, MD  LOS - 2   Chief Complaint  Patient presents with  . Back Pain  . Shortness of Breath        Subjective:    Gerald Gomez today has, No headache, No chest pain, No abdominal pain - No Nausea, No new weakness tingling or numbness, No Cough - SOB. Unreliable historian. [place] of some back pain.   Assessment  & Plan :    1. Acute on chronic hypoxic respiratory failure due to fluid overload from acute on chronic diastolic CHF EF 09%, patient signed out AMA one day before this hospitalization. Hypoxia improved on IV Lasix which will be continued, continue beta blocker. Monitor salt intake and fluid intake. Monitor weight and BMP.  Filed Weights   11/18/14 0604 11/19/14 0546  Weight: 97.523 kg (215 lb) 88.724 kg (195 lb 9.6 oz)    2. CK D stage IV -a sliding creatinine is close to 2.5. Improving with diuresis continue Lasix and monitor. Renal ultrasound done upon admission is stable.   3. History of Parkinson's with Parkinson's dementia. Continue Sinemet, He has some delirium at this time, CT head nonacute, ABG stable, stable ammonia level, minimize narcotics, benzodiazepine's and monitor closely. Fall and aspiration precautions, Speech has been consulted. Possible UTI placed on Rocephin and we'll monitor.   4. Essential hypertension. Stable continue on beta blocker, Lasix and monitor.   5. Anemia of chronic disease. No acute issues supportive care.   6. History of prostate cancer undergoing radiation treatments under the care of Dr. Tammi Gomez and [name].  Outpatient follow-up.   7. Chronic low back pain Will place on tramadol and avoid narcotics due to delirium.   8. Chronic thrombocytopenia likely due to history of hepatitis C. Monitor levels. No acute issue   9. History of hep C. Outpatient follow-up with GI and PCP.   10. Possible UTI. Placed on Rocephin monitor.    Code Status : Full  Family Communication  : wife in detail  Disposition Plan  : To be decided  Consults  :  None  Procedures  :   CT head nonacute  DVT Prophylaxis  :  Lovenox    Lab Results  Component Value Date   PLT 107* 11/19/2014    Inpatient Medications  Scheduled Meds: . carbidopa-levodopa  1 tablet Oral TID WC  . cefTRIAXone (ROCEPHIN)  IV  1 g Intravenous Q24H  . cycloSPORINE  1 drop Both Eyes BID  . diltiazem  60 mg Oral 4 times per day  . enoxaparin (LOVENOX) injection  30 mg Subcutaneous Q24H  . fluticasone  2 spray Each Nare Daily  . furosemide      . furosemide  80 mg Intravenous BID  . metoprolol tartrate  25 mg Oral BID  . potassium chloride  20 mEq Oral BID  . sodium bicarbonate  650 mg Oral BID  . tamsulosin  0.4 mg Oral Daily   Continuous Infusions:  PRN Meds:.acetaminophen, albuterol, haloperidol lactate, hydrALAZINE, HYDROcodone-acetaminophen  Antibiotics  :  Anti-infectives    Start     Dose/Rate Route Frequency Ordered Stop   11/18/14 0815  cefTRIAXone (ROCEPHIN) 1 g in dextrose 5 % 50 mL IVPB     1 g 100 mL/hr over 30 Minutes Intravenous Every 24 hours 11/18/14 0805          Objective:   Filed Vitals:   11/18/14 2035 11/19/14 0546 11/19/14 0800 11/19/14 1032  BP: 163/72 158/97  178/108  Pulse: 72 79    Temp: 98.9 F (37.2 C) 98.9 F (37.2 C)    TempSrc: Oral Axillary    Resp: 18 24 18 18   Weight:  88.724 kg (195 lb 9.6 oz)    SpO2: 98% 97%  100%    Wt Readings from Last 3 Encounters:  11/19/14 88.724 kg (195 lb 9.6 oz)  11/15/14 101.107 kg (222 lb 14.4 oz)  11/07/14 98.7 kg (217 lb 9.5 oz)      Intake/Output Summary (Last 24 hours) at 11/19/14 1104 Last data filed at 11/19/14 0600  Gross per 24 hour  Intake    665 ml  Output   3625 ml  Net  -2960 ml     Physical Exam  Awake , pleasantly confused, No new F.N deficits, Normal affect Jaconita.AT,PERRAL Supple Neck,No JVD, No cervical lymphadenopathy appriciated.  Symmetrical Chest wall movement, Good air movement bilaterally, CTAB RRR,No Gallops,Rubs or new Murmurs, No Parasternal Heave +ve B.Sounds, Abd Soft, No tenderness, No organomegaly appriciated, No rebound - guarding or rigidity. No Cyanosis, Clubbing or edema, No new Rash or bruise      Data Review:   Micro Results Recent Results (from the past 240 hour(s))  Culture, blood (routine x 2)     Status: None   Collection Time: 11/13/14  6:40 AM  Result Value Ref Range Status   Specimen Description BLOOD RIGHT ANTECUBITAL  Final   Special Requests BOTTLES DRAWN AEROBIC AND ANAEROBIC 5CC  Final   Culture NO GROWTH 5 DAYS  Final   Report Status 11/18/2014 FINAL  Final  Culture, blood (routine x 2)     Status: None   Collection Time: 11/13/14  6:45 AM  Result Value Ref Range Status   Specimen Description BLOOD LEFT HAND  Final   Special Requests BOTTLES DRAWN AEROBIC ONLY 3CC  Final   Culture NO GROWTH 5 DAYS  Final   Report Status 11/18/2014 FINAL  Final  Urine culture     Status: None   Collection Time: 11/13/14  7:23 AM  Result Value Ref Range Status   Specimen Description URINE, CLEAN CATCH  Final   Special Requests NONE  Final   Culture NO GROWTH 1 DAY  Final   Report Status 11/14/2014 FINAL  Final  MRSA PCR Screening     Status: None   Collection Time: 11/13/14  2:15 PM  Result Value Ref Range Status   MRSA by PCR NEGATIVE NEGATIVE Final    Comment:        The GeneXpert MRSA Assay (FDA approved for NASAL specimens only), is one component of a comprehensive MRSA colonization surveillance program. It is not intended to diagnose MRSA infection nor  to guide or monitor treatment for MRSA infections.     Radiology Reports Dg Chest 2 View  11/17/2014   CLINICAL DATA:  Shortness of breath.  EXAM: CHEST  2 VIEW  COMPARISON:  November 14, 2014.  FINDINGS: Stable cardiomediastinal silhouette. No pneumothorax or pleural effusion is noted. Mildly increased central pulmonary vascular congestion is  noted with bilateral perihilar at edema. Bony thorax is intact.  IMPRESSION: Mildly increased central pulmonary vascular congestion with probable mild bilateral perihilar edema.   Electronically Signed   By: Marijo Conception, M.D.   On: 11/17/2014 07:10   X-ray Chest Pa And Lateral  11/14/2014   CLINICAL DATA:  Patient with acute respiratory failure.  EXAM: CHEST  2 VIEW  COMPARISON:  Chest radiograph 11/13/2014  FINDINGS: Stable enlarged cardiac and mediastinal contours. Low lung volumes. Bibasilar heterogeneous opacities. No pleural effusion or pneumothorax. Regional skeleton is unremarkable.  IMPRESSION: Cardiomegaly and pulmonary vascular redistribution. Basilar atelectasis.   Electronically Signed   By: Lovey Newcomer M.D.   On: 11/14/2014 09:53   Dg Chest 2 View  11/05/2014   CLINICAL DATA:  Sepsis.  EXAM: CHEST  2 VIEW  COMPARISON:  11/04/2014.  09/08/2014.  FINDINGS: Mediastinum and hilar structures are normal. Heart size stable. Left base subsegmental atelectasis and or mild infiltrate. Small left pleural effusion. Elevation of the left hemidiaphragm. No pneumothorax. No acute bony abnormality.  IMPRESSION: 1. Left lower lobe subsegmental atelectasis and or mild infiltrate. Small left pleural effusion. 2. Elevation of the left hemidiaphragm.   Electronically Signed   By: Marcello Moores  Register   On: 11/05/2014 08:09   Dg Chest 2 View  11/04/2014   CLINICAL DATA:  Shortness of breath. Currently undergoing radiation therapy for prostate cancer.  EXAM: CHEST  2 VIEW  COMPARISON:  09/08/2014 and 02/16/2014  FINDINGS: The heart size and mediastinal contours are within  normal limits. Both lungs are clear. Previous resection of the distal right clavicle. Arthritic changes of the right shoulder.  IMPRESSION: No active cardiopulmonary disease.   Electronically Signed   By: Lorriane Shire M.D.   On: 11/04/2014 08:47   Ct Head Wo Contrast  11/18/2014   CLINICAL DATA:  Confusion.  Altered mental status.  EXAM: CT HEAD WITHOUT CONTRAST  TECHNIQUE: Contiguous axial images were obtained from the base of the skull through the vertex without intravenous contrast.  COMPARISON:  CT scan of September 08, 2014.  FINDINGS: Bony calvarium appears intact. No mass effect or midline shift is noted. Ventricular size is within normal limits. There is no evidence of mass lesion, hemorrhage or acute infarction.  IMPRESSION: Normal head CT.   Electronically Signed   By: Marijo Conception, M.D.   On: 11/18/2014 09:34   US Renal  11/17/2014   CLINICAL DATA:  Chronic renal insufficiency stage IV  EXAM: RENAL / URINARY TRACT ULTRASOUND COMPLETE  COMPARISON:  Abdominal ultrasound dated March 25, 2013  FINDINGS: Right Kidney:  Length: 10.9 cm. The renal cortical echotexture is increased and is equal to or exceeds that of the adjacent liver. There is no parenchymal mass or hydronephrosis.  Left Kidney:  Length: 11.1 cm the renal cortical echotexture is increased. There is no mass or hydronephrosis. Echogenicity within normal limits. No mass or hydronephrosis visualized.  Bladder:  Appears normal for degree of bladder distention. The urinary bladder wall measures approximately 5.5 mm.  IMPRESSION: Increased renal cortical echotexture consistent with medical renal disease. There is no hydronephrosis.   Electronically Signed   By: David  Martinique M.D.   On: 11/17/2014 10:30   Dg Chest Port 1 View  11/19/2014   CLINICAL DATA:  Shortness of Breath  EXAM: PORTABLE CHEST - 1 VIEW  COMPARISON:  11/17/2014  FINDINGS: Cardiac shadow is mildly enlarged but stable. The lungs are well aerated bilaterally. Mild  interstitial changes are again  seen similar to that noted on the prior study. No focal confluent infiltrate is seen.  IMPRESSION: Mild persistent interstitial change although somewhat improved when compared with the prior exam. No focal infiltrate is noted.   Electronically Signed   By: Inez Catalina M.D.   On: 11/19/2014 07:50   Dg Chest Port 1 View  11/13/2014   CLINICAL DATA:  Shortness of breath for 1 day. Former smoker. Congestive heart failure. Hypertension.  EXAM: PORTABLE CHEST - 1 VIEW  COMPARISON:  11/05/2014  FINDINGS: Mild elevation of left hemidiaphragm remains stable. No evidence of pulmonary consolidation or edema. Mild cardiomegaly stable. No definite evidence of pleural effusion.  IMPRESSION: Mild elevation of left hemidiaphragm again noted.  No acute findings   Electronically Signed   By: Earle Gell M.D.   On: 11/13/2014 07:04     CBC  Recent Labs Lab 11/13/14 0640  11/14/14 0220 11/15/14 0228 11/16/14 0518 11/17/14 0636 11/19/14 0345  WBC 8.0  < > 8.5 9.9 13.7* 10.7* 12.3*  HGB 8.2*  < > 7.6* 7.0* 8.0* 8.5* 9.4*  HCT 26.4*  < > 23.6* 22.0* 24.4* 26.1* 29.0*  PLT 130*  < > 103* 118* 128* 105* 107*  MCV 85.2  < > 85.5 83.0 82.4 83.1 82.4  MCH 26.5  < > 27.5 26.4 27.0 27.1 26.7  MCHC 31.1  < > 32.2 31.8 32.8 32.6 32.4  RDW 15.6*  < > 15.6* 15.4 15.4 15.5 15.0  LYMPHSABS 0.9  --   --  0.3*  --  1.3  --   MONOABS 1.0  --   --  0.6  --  0.5  --   EOSABS 0.1  --   --  0.0  --  0.0  --   BASOSABS 0.0  --   --  0.0  --  0.0  --   < > = values in this interval not displayed.  Chemistries   Recent Labs Lab 11/15/14 0228 11/16/14 0518 11/17/14 0636 11/17/14 1011 11/18/14 0610 11/19/14 0345  NA 139 138 139  --  142 140  K 3.8 4.2 4.1  --  3.8 3.5  CL 111 112* 111  --  108 103  CO2 16* 19* 17*  --  23 24  GLUCOSE 176* 133* 140*  --  133* 163*  BUN 85* 92* 94*  --  77* 66*  CREATININE 2.69* 2.74* 2.61*  --  2.39* 2.35*  CALCIUM 8.5* 8.6* 8.5*  --  9.0 8.9  MG 2.2   --   --  2.0  --   --    ------------------------------------------------------------------------------------------------------------------ estimated creatinine clearance is 29.8 mL/min (by C-G formula based on Cr of 2.35). ------------------------------------------------------------------------------------------------------------------ No results for input(s): HGBA1C in the last 72 hours. ------------------------------------------------------------------------------------------------------------------ No results for input(s): CHOL, HDL, LDLCALC, TRIG, CHOLHDL, LDLDIRECT in the last 72 hours. ------------------------------------------------------------------------------------------------------------------ No results for input(s): TSH, T4TOTAL, T3FREE, THYROIDAB in the last 72 hours.  Invalid input(s): FREET3 ------------------------------------------------------------------------------------------------------------------ No results for input(s): VITAMINB12, FOLATE, FERRITIN, TIBC, IRON, RETICCTPCT in the last 72 hours.  Coagulation profile No results for input(s): INR, PROTIME in the last 168 hours.  No results for input(s): DDIMER in the last 72 hours.  Cardiac Enzymes  Recent Labs Lab 11/13/14 2022 11/14/14 0220 11/17/14 0636  TROPONINI 0.14* 0.17* 0.11*   ------------------------------------------------------------------------------------------------------------------ Invalid input(s): POCBNP   Time Spent in minutes  35   SINGH,PRASHANT K M.D on 11/19/2014 at 11:04 AM  Between 7am to 7pm - Pager - [phone]  After 7pm go to [email or web address] - password [facility]  [facility] -  Office  [phone]

## 2014-11-19 NOTE — Progress Notes (Signed)
UR completed 

## 2014-11-20 LAB — CBC
HEMATOCRIT: 30.2 % — AB (ref 39.0–52.0)
HEMOGLOBIN: 9.8 g/dL — AB (ref 13.0–17.0)
MCH: 26.9 pg (ref 26.0–34.0)
MCHC: 32.5 g/dL (ref 30.0–36.0)
MCV: 83 fL (ref 78.0–100.0)
Platelets: 120 10*3/uL — ABNORMAL LOW (ref 150–400)
RBC: 3.64 MIL/uL — ABNORMAL LOW (ref 4.22–5.81)
RDW: 14.8 % (ref 11.5–15.5)
WBC: 11.8 10*3/uL — ABNORMAL HIGH (ref 4.0–10.5)

## 2014-11-20 LAB — BASIC METABOLIC PANEL
Anion gap: 11 (ref 5–15)
BUN: 56 mg/dL — AB (ref 6–20)
CHLORIDE: 97 mmol/L — AB (ref 101–111)
CO2: 28 mmol/L (ref 22–32)
Calcium: 8.6 mg/dL — ABNORMAL LOW (ref 8.9–10.3)
Creatinine, Ser: 2.28 mg/dL — ABNORMAL HIGH (ref 0.61–1.24)
GFR calc Af Amer: 31 mL/min — ABNORMAL LOW (ref >60)
GFR, EST NON AFRICAN AMERICAN: 26 mL/min — AB (ref >60)
GLUCOSE: 152 mg/dL — AB (ref 65–99)
POTASSIUM: 3.5 mmol/L (ref 3.5–5.1)
Sodium: 136 mmol/L (ref 135–145)

## 2014-11-20 LAB — MAGNESIUM: Magnesium: 1.3 mg/dL — ABNORMAL LOW (ref 1.7–2.4)

## 2014-11-20 MED ORDER — POTASSIUM CHLORIDE 20 MEQ/15ML (10%) PO SOLN
40.0000 meq | Freq: Two times a day (BID) | ORAL | Status: DC
Start: 2014-11-20 — End: 2014-11-21
  Administered 2014-11-20 (×2): 40 meq via ORAL
  Filled 2014-11-20 (×2): qty 30

## 2014-11-20 MED ORDER — MAGNESIUM SULFATE 2 GM/50ML IV SOLN
2.0000 g | Freq: Once | INTRAVENOUS | Status: AC
  Administered 2014-11-20: 2 g via INTRAVENOUS
  Filled 2014-11-20: qty 50

## 2014-11-20 MED ORDER — DILTIAZEM HCL 60 MG PO TABS: 120.0000 mg | ORAL_TABLET | Freq: Four times a day (QID) | ORAL | Status: DC

## 2014-11-20 MED ORDER — DILTIAZEM HCL ER COATED BEADS 240 MG PO CP24
240.0000 mg | ORAL_CAPSULE | Freq: Every day | ORAL | Status: DC
  Administered 2014-11-20: 240 mg via ORAL
  Filled 2014-11-20: qty 1

## 2014-11-20 MED ORDER — MAGNESIUM SULFATE 2 GM/50ML IV SOLN
2.0000 g | Freq: Once | INTRAVENOUS | Status: AC
Start: 2014-11-20 — End: 2014-11-20
  Administered 2014-11-20: 2 g via INTRAVENOUS
  Filled 2014-11-20: qty 50

## 2014-11-20 MED ORDER — SODIUM BICARBONATE 650 MG PO TABS
325.0000 mg | ORAL_TABLET | Freq: Every day | ORAL | Status: DC
  Administered 2014-11-21 – 2014-11-24 (×4): 325 mg via ORAL
  Filled 2014-11-20 (×4): qty 1

## 2014-11-20 MED ORDER — ASPIRIN 81 MG PO CHEW
81.0000 mg | CHEWABLE_TABLET | Freq: Every day | ORAL | Status: DC
  Administered 2014-11-20 – 2014-11-24 (×5): 81 mg via ORAL
  Filled 2014-11-20 (×5): qty 1

## 2014-11-20 NOTE — Progress Notes (Signed)
Patient remains agitated, confused, pulling tubes and still trying to get out of bed. Restraints remain intact.Patient attended to and needs met.

## 2014-11-20 NOTE — Progress Notes (Signed)
Patient Demographics:    Gerald Gomez, is a 74 y.o. male, DOB - 05-29-1940, NID:[id number]  Admit date - 11/17/2014   Admitting Physician No admitting provider for patient encounter.  Outpatient Primary MD for the patient is [name], MD  LOS - 3   Chief Complaint  Patient presents with  . Back Pain  . Shortness of Breath        Subjective:    Gerald Gomez today has, No headache, No chest pain, No abdominal pain - No Nausea, No new weakness tingling or numbness, No Cough - SOB. Unreliable historian. Looks comfortable today.   Assessment  & Plan :    1. Acute on chronic hypoxic respiratory failure due to fluid overload from acute on chronic diastolic CHF EF 14%, patient signed out AMA one day before this hospitalization. Hypoxia improved on IV Lasix which will be continued, continue beta blocker. Monitor salt intake and fluid intake. Monitor weight and BMP.  Filed Weights   11/18/14 0604 11/19/14 0546  Weight: 97.523 kg (215 lb) 88.724 kg (195 lb 9.6 oz)    2. CKD stage IV metabolic acidosis -his baseline creatinine is close to 2.5. Improving with diuresis continue Lasix and monitor. Renal ultrasound done upon admission is stable. Bicarbonate is steadily rising so I have dropped oral bicarbonate dose.   3. History of Parkinson's with Parkinson's dementia. Continue Sinemet, He has some delirium at this time, CT head nonacute, ABG stable, stable ammonia level, minimize narcotics, benzodiazepine's and monitor closely. Fall and aspiration precautions, Speech has been consulted. Possible UTI placed on Rocephin and we'll monitor. Delirium seems to have improved on 11/20/2014 Will monitor.   4. Essential hypertension. Stable continue on beta blocker, Lasix and monitor.   5. Anemia of chronic  disease. No acute issues supportive care.   6. History of prostate cancer undergoing radiation treatments under the care of Dr. [name] and [name]. Outpatient follow-up.   7. Chronic low back pain Will place on tramadol and avoid narcotics due to delirium.   8. Chronic thrombocytopenia likely due to history of hepatitis C. Monitor levels. No acute issue   9. History of hep C. Outpatient follow-up with GI and PCP.   10. Possible UTI. Placed on Rocephin monitor.   11. New onset SVT, paroxysmal atrial fibrillation and atrial flutter. All discussed with cardiologist Dr. Lovena Le on 11/20/2014. For now goal will be rate controlled, continue Cardizem along with beta blocker, placed on 81 mg of aspirin. He is an extremely poor candidate for anticoagulation due to his noncompliance, note he signed out AMA few days prior to this admission. Dementia with delirium with extremely high fall risk.    Code Status : Full  Family Communication  : wife in detail  Disposition Plan  : To be decided  Consults  :  None  Procedures  :   CT head nonacute  DVT Prophylaxis  :  Lovenox    Lab Results  Component Value Date   PLT 120* 11/20/2014    Inpatient Medications  Scheduled Meds: . carbidopa-levodopa  1 tablet Oral TID WC  . cefTRIAXone (ROCEPHIN)  IV  1 g Intravenous Q24H  . cycloSPORINE  1 drop Both Eyes BID  . diltiazem  120 mg Oral  4 times per day  . enoxaparin (LOVENOX) injection  30 mg Subcutaneous Q24H  . fluticasone  2 spray Each Nare Daily  . furosemide  80 mg Intravenous BID  . metoprolol tartrate  25 mg Oral BID  . potassium chloride  40 mEq Oral BID  . sodium bicarbonate  650 mg Oral BID  . tamsulosin  0.4 mg Oral Daily   Continuous Infusions:  PRN Meds:.acetaminophen, albuterol, diltiazem, haloperidol lactate, hydrALAZINE, HYDROcodone-acetaminophen  Antibiotics  :    Anti-infectives    Start     Dose/Rate Route Frequency Ordered Stop   11/18/14 0815  cefTRIAXone  (ROCEPHIN) 1 g in dextrose 5 % 50 mL IVPB     1 g 100 mL/hr over 30 Minutes Intravenous Every 24 hours 11/18/14 0805          Objective:   Filed Vitals:   11/19/14 2050 11/19/14 2307 11/20/14 0126 11/20/14 0403  BP: 183/103 173/109 125/68 157/82  Pulse:  155 76 76  Temp:  98.5 F (36.9 C) 97.7 F (36.5 C) 98.3 F (36.8 C)  TempSrc:  Oral Oral Oral  Resp:  19 20 19   Weight:      SpO2:  97% 98% 99%    Wt Readings from Last 3 Encounters:  11/19/14 88.724 kg (195 lb 9.6 oz)  11/15/14 101.107 kg (222 lb 14.4 oz)  11/07/14 98.7 kg (217 lb 9.5 oz)     Intake/Output Summary (Last 24 hours) at 11/20/14 0949 Last data filed at 11/20/14 0941  Gross per 24 hour  Intake    360 ml  Output   2300 ml  Net  -1940 ml     Physical Exam  Awake , pleasantly confused, No new F.N deficits, Normal affect [place].AT,PERRAL Supple Neck,No JVD, No cervical lymphadenopathy appriciated.  Symmetrical Chest wall movement, Good air movement bilaterally, CTAB iRRR,No Gallops,Rubs or new Murmurs, No Parasternal Heave +ve B.Sounds, Abd Soft, No tenderness, No organomegaly appriciated, No rebound - guarding or rigidity. No Cyanosis, Clubbing or edema, No new Rash or bruise      Data Review:   Micro Results Recent Results (from the past 240 hour(s))  Culture, blood (routine x 2)     Status: None   Collection Time: 11/13/14  6:40 AM  Result Value Ref Range Status   Specimen Description BLOOD RIGHT ANTECUBITAL  Final   Special Requests BOTTLES DRAWN AEROBIC AND ANAEROBIC 5CC  Final   Culture NO GROWTH 5 DAYS  Final   Report Status 11/18/2014 FINAL  Final  Culture, blood (routine x 2)     Status: None   Collection Time: 11/13/14  6:45 AM  Result Value Ref Range Status   Specimen Description BLOOD LEFT HAND  Final   Special Requests BOTTLES DRAWN AEROBIC ONLY 3CC  Final   Culture NO GROWTH 5 DAYS  Final   Report Status 11/18/2014 FINAL  Final  Urine culture     Status: None   Collection Time:  11/13/14  7:23 AM  Result Value Ref Range Status   Specimen Description URINE, CLEAN CATCH  Final   Special Requests NONE  Final   Culture NO GROWTH 1 DAY  Final   Report Status 11/14/2014 FINAL  Final  MRSA PCR Screening     Status: None   Collection Time: 11/13/14  2:15 PM  Result Value Ref Range Status   MRSA by PCR NEGATIVE NEGATIVE Final    Comment:        The GeneXpert MRSA Assay (  FDA approved for NASAL specimens only), is one component of a comprehensive MRSA colonization surveillance program. It is not intended to diagnose MRSA infection nor to guide or monitor treatment for MRSA infections.     Radiology Reports Dg Chest 2 View  11/17/2014   CLINICAL DATA:  Shortness of breath.  EXAM: CHEST  2 VIEW  COMPARISON:  November 14, 2014.  FINDINGS: Stable cardiomediastinal silhouette. No pneumothorax or pleural effusion is noted. Mildly increased central pulmonary vascular congestion is noted with bilateral perihilar at edema. Bony thorax is intact.  IMPRESSION: Mildly increased central pulmonary vascular congestion with probable mild bilateral perihilar edema.   Electronically Signed   By: Marijo Conception, M.D.   On: 11/17/2014 07:10   X-ray Chest Pa And Lateral  11/14/2014   CLINICAL DATA:  Patient with acute respiratory failure.  EXAM: CHEST  2 VIEW  COMPARISON:  Chest radiograph 11/13/2014  FINDINGS: Stable enlarged cardiac and mediastinal contours. Low lung volumes. Bibasilar heterogeneous opacities. No pleural effusion or pneumothorax. Regional skeleton is unremarkable.  IMPRESSION: Cardiomegaly and pulmonary vascular redistribution. Basilar atelectasis.   Electronically Signed   By: Lovey Newcomer M.D.   On: 11/14/2014 09:53   Dg Chest 2 View  11/05/2014   CLINICAL DATA:  Sepsis.  EXAM: CHEST  2 VIEW  COMPARISON:  11/04/2014.  09/08/2014.  FINDINGS: Mediastinum and hilar structures are normal. Heart size stable. Left base subsegmental atelectasis and or mild infiltrate. Small left  pleural effusion. Elevation of the left hemidiaphragm. No pneumothorax. No acute bony abnormality.  IMPRESSION: 1. Left lower lobe subsegmental atelectasis and or mild infiltrate. Small left pleural effusion. 2. Elevation of the left hemidiaphragm.   Electronically Signed   By: Marcello Moores  Register   On: 11/05/2014 08:09   Dg Chest 2 View  11/04/2014   CLINICAL DATA:  Shortness of breath. Currently undergoing radiation therapy for prostate cancer.  EXAM: CHEST  2 VIEW  COMPARISON:  09/08/2014 and 02/16/2014  FINDINGS: The heart size and mediastinal contours are within normal limits. Both lungs are clear. Previous resection of the distal right clavicle. Arthritic changes of the right shoulder.  IMPRESSION: No active cardiopulmonary disease.   Electronically Signed   By: Lorriane Shire M.D.   On: 11/04/2014 08:47   Ct Head Wo Contrast  11/18/2014   CLINICAL DATA:  Confusion.  Altered mental status.  EXAM: CT HEAD WITHOUT CONTRAST  TECHNIQUE: Contiguous axial images were obtained from the base of the skull through the vertex without intravenous contrast.  COMPARISON:  CT scan of September 08, 2014.  FINDINGS: Bony calvarium appears intact. No mass effect or midline shift is noted. Ventricular size is within normal limits. There is no evidence of mass lesion, hemorrhage or acute infarction.  IMPRESSION: Normal head CT.   Electronically Signed   By: Marijo Conception, M.D.   On: 11/18/2014 09:34   US Renal  11/17/2014   CLINICAL DATA:  Chronic renal insufficiency stage IV  EXAM: RENAL / URINARY TRACT ULTRASOUND COMPLETE  COMPARISON:  Abdominal ultrasound dated March 25, 2013  FINDINGS: Right Kidney:  Length: 10.9 cm. The renal cortical echotexture is increased and is equal to or exceeds that of the adjacent liver. There is no parenchymal mass or hydronephrosis.  Left Kidney:  Length: 11.1 cm the renal cortical echotexture is increased. There is no mass or hydronephrosis. Echogenicity within normal limits. No mass or  hydronephrosis visualized.  Bladder:  Appears normal for degree of bladder distention. The urinary bladder  wall measures approximately 5.5 mm.  IMPRESSION: Increased renal cortical echotexture consistent with medical renal disease. There is no hydronephrosis.   Electronically Signed   By: David  Martinique M.D.   On: 11/17/2014 10:30   Dg Chest Port 1 View  11/19/2014   CLINICAL DATA:  Shortness of Breath  EXAM: PORTABLE CHEST - 1 VIEW  COMPARISON:  11/17/2014  FINDINGS: Cardiac shadow is mildly enlarged but stable. The lungs are well aerated bilaterally. Mild interstitial changes are again seen similar to that noted on the prior study. No focal confluent infiltrate is seen.  IMPRESSION: Mild persistent interstitial change although somewhat improved when compared with the prior exam. No focal infiltrate is noted.   Electronically Signed   By: Inez Catalina M.D.   On: 11/19/2014 07:50   Dg Chest Port 1 View  11/13/2014   CLINICAL DATA:  Shortness of breath for 1 day. Former smoker. Congestive heart failure. Hypertension.  EXAM: PORTABLE CHEST - 1 VIEW  COMPARISON:  11/05/2014  FINDINGS: Mild elevation of left hemidiaphragm remains stable. No evidence of pulmonary consolidation or edema. Mild cardiomegaly stable. No definite evidence of pleural effusion.  IMPRESSION: Mild elevation of left hemidiaphragm again noted.  No acute findings   Electronically Signed   By: Earle Gell M.D.   On: 11/13/2014 07:04     CBC  Recent Labs Lab 11/15/14 0228 11/16/14 0518 11/17/14 0636 11/19/14 0345 11/20/14 0400  WBC 9.9 13.7* 10.7* 12.3* 11.8*  HGB 7.0* 8.0* 8.5* 9.4* 9.8*  HCT 22.0* 24.4* 26.1* 29.0* 30.2*  PLT 118* 128* 105* 107* 120*  MCV 83.0 82.4 83.1 82.4 83.0  MCH 26.4 27.0 27.1 26.7 26.9  MCHC 31.8 32.8 32.6 32.4 32.5  RDW 15.4 15.4 15.5 15.0 14.8  LYMPHSABS 0.3*  --  1.3  --   --   MONOABS 0.6  --  0.5  --   --   EOSABS 0.0  --  0.0  --   --   BASOSABS 0.0  --  0.0  --   --     Chemistries    Recent Labs Lab 11/15/14 0228 11/16/14 0518 11/17/14 0636 11/17/14 1011 11/18/14 0610 11/19/14 0345 11/20/14 0400  NA 139 138 139  --  142 140 136  K 3.8 4.2 4.1  --  3.8 3.5 3.5  CL 111 112* 111  --  108 103 97*  CO2 16* 19* 17*  --  23 24 28   GLUCOSE 176* 133* 140*  --  133* 163* 152*  BUN 85* 92* 94*  --  77* 66* 56*  CREATININE 2.69* 2.74* 2.61*  --  2.39* 2.35* 2.28*  CALCIUM 8.5* 8.6* 8.5*  --  9.0 8.9 8.6*  MG 2.2  --   --  2.0  --   --  1.3*   ------------------------------------------------------------------------------------------------------------------ estimated creatinine clearance is 30.7 mL/min (by C-G formula based on Cr of 2.28). ------------------------------------------------------------------------------------------------------------------ No results for input(s): HGBA1C in the last 72 hours. ------------------------------------------------------------------------------------------------------------------ No results for input(s): CHOL, HDL, LDLCALC, TRIG, CHOLHDL, LDLDIRECT in the last 72 hours. ------------------------------------------------------------------------------------------------------------------ No results for input(s): TSH, T4TOTAL, T3FREE, THYROIDAB in the last 72 hours.  Invalid input(s): FREET3 ------------------------------------------------------------------------------------------------------------------ No results for input(s): VITAMINB12, FOLATE, FERRITIN, TIBC, IRON, RETICCTPCT in the last 72 hours.  Coagulation profile No results for input(s): INR, PROTIME in the last 168 hours.  No results for input(s): DDIMER in the last 72 hours.  Cardiac Enzymes  Recent Labs Lab 11/13/14 2022 11/14/14 0220 11/17/14 0636  TROPONINI  0.14* 0.17* 0.11*   ------------------------------------------------------------------------------------------------------------------ Invalid input(s): POCBNP   Time Spent in minutes   35   Neziah Vogelgesang K M.D on 11/20/2014 at 9:49 AM  Between 7am to 7pm - Pager - [phone]  After 7pm go to [email or web address] - password [facility]  [facility] -  Office  [phone]

## 2014-11-21 LAB — BASIC METABOLIC PANEL
Anion gap: 10 (ref 5–15)
BUN: 51 mg/dL — AB (ref 6–20)
CALCIUM: 8.6 mg/dL — AB (ref 8.9–10.3)
CO2: 28 mmol/L (ref 22–32)
CREATININE: 2.42 mg/dL — AB (ref 0.61–1.24)
Chloride: 97 mmol/L — ABNORMAL LOW (ref 101–111)
GFR calc Af Amer: 28 mL/min — ABNORMAL LOW (ref >60)
GFR, EST NON AFRICAN AMERICAN: 25 mL/min — AB (ref >60)
GLUCOSE: 148 mg/dL — AB (ref 65–99)
POTASSIUM: 4.4 mmol/L (ref 3.5–5.1)
SODIUM: 135 mmol/L (ref 135–145)

## 2014-11-21 LAB — MAGNESIUM: MAGNESIUM: 2.1 mg/dL (ref 1.7–2.4)

## 2014-11-21 MED ORDER — LORAZEPAM 2 MG/ML IJ SOLN
1.0000 mg | Freq: Once | INTRAMUSCULAR | Status: AC
  Administered 2014-11-21: 1 mg via INTRAVENOUS
  Filled 2014-11-21: qty 1

## 2014-11-21 MED ORDER — FUROSEMIDE 40 MG PO TABS
40.0000 mg | ORAL_TABLET | Freq: Two times a day (BID) | ORAL | Status: DC
  Administered 2014-11-21: 40 mg via ORAL
  Filled 2014-11-21: qty 1

## 2014-11-21 MED ORDER — DILTIAZEM HCL ER COATED BEADS 180 MG PO CP24
300.0000 mg | ORAL_CAPSULE | Freq: Every day | ORAL | Status: DC
  Administered 2014-11-21 – 2014-11-24 (×4): 300 mg via ORAL
  Filled 2014-11-21 (×8): qty 1

## 2014-11-21 MED ORDER — HYDRALAZINE HCL 25 MG PO TABS
25.0000 mg | ORAL_TABLET | Freq: Three times a day (TID) | ORAL | Status: DC
  Administered 2014-11-21 – 2014-11-24 (×11): 25 mg via ORAL
  Filled 2014-11-21 (×12): qty 1

## 2014-11-21 MED ORDER — POTASSIUM CHLORIDE 20 MEQ/15ML (10%) PO SOLN
40.0000 meq | Freq: Every day | ORAL | Status: DC
  Administered 2014-11-21: 40 meq via ORAL
  Filled 2014-11-21: qty 30

## 2014-11-21 MED ORDER — HALOPERIDOL LACTATE 5 MG/ML IJ SOLN: 2.5000 mg | Freq: Once | INTRAMUSCULAR | Status: DC

## 2014-11-21 NOTE — Progress Notes (Signed)
Pt continues to get out of bed continually, is high falls risk, pt is oriented to self and place but does not follow direction. Pt agitated and cursing at [employer], Haldol already given. RN/NT unable to leave room currently. NP on call notified.Gerald Gomez with new order for haldol 2.5mg  and sitter. Pt QTc 0.46 and having frequent PVCs, NP notified and order for ativan instead. Ativan given and sitter at bedside.

## 2014-11-21 NOTE — Clinical Social Work Placement (Signed)
   CLINICAL SOCIAL WORK PLACEMENT  NOTE  Date:  11/21/2014  Patient Details  Name: Gerald Gomez MRN: [id number] Date of Birth: 1940/09/01  Clinical Social Work is seeking post-discharge placement for this patient at the [place] level of care (*CSW will initial, date and re-position this form in  chart as items are completed):  Yes   Patient/family provided with [place] Work Department's list of facilities offering this level of care within the geographic area requested by the patient (or if unable, by the patient's family).  Yes   Patient/family informed of their freedom to choose among providers that offer the needed level of care, that participate in Medicare, Medicaid or managed care program needed by the patient, have an available bed and are willing to accept the patient.      Patient/family informed of [facility]'s ownership interest in [facility] and [facility], as well as of the fact that they are under no obligation to receive care at these facilities.  PASRR submitted to EDS on       PASRR number received on       Existing PASRR number confirmed on 11/21/14     FL2 transmitted to all facilities in geographic area requested by pt/family on 11/21/14     FL2 transmitted to all facilities within larger geographic area on       Patient informed that his/her managed care company has contracts with or will negotiate with certain facilities, including the following:            Patient/family informed of bed offers received.  Patient chooses bed at       Physician recommends and patient chooses bed at      Patient to be transferred to   on  .  Patient to be transferred to facility by       Patient family notified on   of transfer.  Name of family member notified:        PHYSICIAN       Additional Comment:    _______________________________________________ Roanna Raider, LCSW 11/21/2014, 4:29 PM

## 2014-11-21 NOTE — Progress Notes (Signed)
Patient Demographics:    Gerald Gomez, is a 74 y.o. male, DOB - 08-31-1940, ZHG:[id number]  Admit date - 11/17/2014   Admitting Physician No admitting provider for patient encounter.  Outpatient Primary MD for the patient is Gerald Cower, MD  LOS - 4   Chief Complaint  Patient presents with  . Back Pain  . Shortness of Breath        Subjective:    Gerald Gomez today has, No headache, No chest pain, No abdominal pain - No Nausea, No new weakness tingling or numbness, No Cough - SOB. Unreliable historian. Looks comfortable today.   Assessment  & Plan :    1. Acute on chronic hypoxic respiratory failure due to fluid overload from acute on chronic diastolic CHF EF 19%, patient signed out AMA one day before this hospitalization. Hypoxia improved on IV Lasix which will be continued, continue beta blocker. Monitor salt intake and fluid intake. Monitor weight and BMP.  Filed Weights   11/19/14 0546 11/20/14 0900 11/21/14 0454  Weight: 88.724 kg (195 lb 9.6 oz) 89.994 kg (198 lb 6.4 oz) 88.4 kg (194 lb 14.2 oz)    2. CKD stage IV metabolic acidosis -his baseline creatinine is close to 2.5. Improving with diuresis continue Lasix and monitor. Renal ultrasound done upon admission is stable. Bicarbonate is steadily rising so I have dropped oral bicarbonate dose.   3. History of Parkinson's with Parkinson's dementia. Continue Sinemet, He has some delirium at this time, CT head nonacute, ABG stable, stable ammonia level, minimize narcotics, benzodiazepine's and monitor closely. Fall and aspiration precautions, Speech has been consulted. Possible UTI placed on Rocephin and we'll monitor. His delirium continues to improve and now appears to be close to baseline.   4. Essential hypertension. Stable continue on beta  blocker, Lasix and monitor.   5. Anemia of chronic disease. No acute issues supportive care.   6. History of prostate cancer undergoing radiation treatments under the care of Dr. Tammi Gomez and Gerald Gomez. Outpatient follow-up.   7. Chronic low back pain Will place on tramadol and avoid narcotics due to delirium.   8. Chronic thrombocytopenia likely due to history of hepatitis C. Monitor levels. No acute issue   9. History of hep C. Outpatient follow-up with GI and PCP.   10. Possible UTI. Placed on Rocephin monitor.   11. New onset SVT, paroxysmal atrial fibrillation and atrial flutter. All discussed with cardiologist Gerald Gomez on 11/20/2014. For now goal will be rate controlled, continue Cardizem along with beta blocker, placed on 81 mg of aspirin. He is an extremely poor candidate for anticoagulation due to his noncompliance, note he signed out AMA few days prior to this admission. Dementia with delirium with extremely high fall risk.   12. Non-ACS pattern troponin rise which was trending from 11/13/2014 last admission to 11/17/2014. Nonacute EKG, not a candidate for invasive procedures due to his underlying kidney problem along with poor functional status and high fall risk. Continue aspirin and Plavix. Remained chest pain-free throughout. Outpatient cartilage follow-up.     Code Status : Full  Family Communication  : wife in detail  Disposition Plan  : SNF recommended by PT  Consults  :  Social work  Procedures  :  CT head nonacute  DVT Prophylaxis  :  Lovenox    Lab Results  Component Value Date   PLT 120* 11/20/2014    Inpatient Medications  Scheduled Meds: . aspirin  81 mg Oral Daily  . carbidopa-levodopa  1 tablet Oral TID WC  . cefTRIAXone (ROCEPHIN)  IV  1 g Intravenous Q24H  . cycloSPORINE  1 drop Both Eyes BID  . diltiazem  300 mg Oral Daily  . enoxaparin (LOVENOX) injection  30 mg Subcutaneous Q24H  . fluticasone  2 spray Each Nare Daily  .  furosemide  80 mg Intravenous BID  . hydrALAZINE  25 mg Oral 3 times per day  . metoprolol tartrate  25 mg Oral BID  . potassium chloride  40 mEq Oral Daily  . sodium bicarbonate  325 mg Oral Daily  . tamsulosin  0.4 mg Oral Daily   Continuous Infusions:  PRN Meds:.acetaminophen, albuterol, diltiazem, haloperidol lactate, hydrALAZINE, HYDROcodone-acetaminophen  Antibiotics  :    Anti-infectives    Start     Dose/Rate Route Frequency Ordered Stop   11/18/14 0815  cefTRIAXone (ROCEPHIN) 1 g in dextrose 5 % 50 mL IVPB     1 g 100 mL/hr over 30 Minutes Intravenous Every 24 hours 11/18/14 0805          Objective:   Filed Vitals:   11/20/14 0900 11/20/14 1523 11/20/14 2046 11/21/14 0454  BP:  111/41 152/87 164/81  Pulse:   118 71  Temp:  98.5 F (36.9 C) 97.8 F (36.6 C) 97.4 F (36.3 C)  TempSrc:  Oral Oral Oral  Resp:  20 18 18   Weight: 89.994 kg (198 lb 6.4 oz)   88.4 kg (194 lb 14.2 oz)  SpO2:  96% 98% 98%    Wt Readings from Last 3 Encounters:  11/21/14 88.4 kg (194 lb 14.2 oz)  11/15/14 101.107 kg (222 lb 14.4 oz)  11/07/14 98.7 kg (217 lb 9.5 oz)     Intake/Output Summary (Last 24 hours) at 11/21/14 1033 Last data filed at 11/21/14 1009  Gross per 24 hour  Intake    896 ml  Output    551 ml  Net    345 ml     Physical Exam  Awake , pleasantly confused, No new F.N deficits, Normal affect Gerald Gomez Supple Neck,No JVD, No cervical lymphadenopathy appriciated.  Symmetrical Chest wall movement, Good air movement bilaterally, CTAB iRRR,No Gallops,Rubs or new Murmurs, No Parasternal Heave +ve B.Sounds, Abd Soft, No tenderness, No organomegaly appriciated, No rebound - guarding or rigidity. No Cyanosis, Clubbing or edema, No new Rash or bruise      Data Review:   Micro Results Recent Results (from the past 240 hour(s))  Culture, blood (routine x 2)     Status: None   Collection Time: 11/13/14  6:40 AM  Result Value Ref Range Status   Specimen  Description BLOOD RIGHT ANTECUBITAL  Final   Special Requests BOTTLES DRAWN AEROBIC AND ANAEROBIC 5CC  Final   Culture NO GROWTH 5 DAYS  Final   Report Status 11/18/2014 FINAL  Final  Culture, blood (routine x 2)     Status: None   Collection Time: 11/13/14  6:45 AM  Result Value Ref Range Status   Specimen Description BLOOD LEFT HAND  Final   Special Requests BOTTLES DRAWN AEROBIC ONLY 3CC  Final   Culture NO GROWTH 5 DAYS  Final   Report Status 11/18/2014 FINAL  Final  Urine culture  Status: None   Collection Time: 11/13/14  7:23 AM  Result Value Ref Range Status   Specimen Description URINE, CLEAN CATCH  Final   Special Requests NONE  Final   Culture NO GROWTH 1 DAY  Final   Report Status 11/14/2014 FINAL  Final  MRSA PCR Screening     Status: None   Collection Time: 11/13/14  2:15 PM  Result Value Ref Range Status   MRSA by PCR NEGATIVE NEGATIVE Final    Comment:        The GeneXpert MRSA Assay (FDA approved for NASAL specimens only), is one component of a comprehensive MRSA colonization surveillance program. It is not intended to diagnose MRSA infection nor to guide or monitor treatment for MRSA infections.     Radiology Reports Dg Chest 2 View  11/17/2014   CLINICAL DATA:  Shortness of breath.  EXAM: CHEST  2 VIEW  COMPARISON:  November 14, 2014.  FINDINGS: Stable cardiomediastinal silhouette. No pneumothorax or pleural effusion is noted. Mildly increased central pulmonary vascular congestion is noted with bilateral perihilar at edema. Bony thorax is intact.  IMPRESSION: Mildly increased central pulmonary vascular congestion with probable mild bilateral perihilar edema.   Electronically Signed   By: Marijo Conception, M.D.   On: 11/17/2014 07:10   X-ray Chest Pa And Lateral  11/14/2014   CLINICAL DATA:  Patient with acute respiratory failure.  EXAM: CHEST  2 VIEW  COMPARISON:  Chest radiograph 11/13/2014  FINDINGS: Stable enlarged cardiac and mediastinal contours. Low  lung volumes. Bibasilar heterogeneous opacities. No pleural effusion or pneumothorax. Regional skeleton is unremarkable.  IMPRESSION: Cardiomegaly and pulmonary vascular redistribution. Basilar atelectasis.   Electronically Signed   By: Lovey Newcomer M.D.   On: 11/14/2014 09:53   Dg Chest 2 View  11/05/2014   CLINICAL DATA:  Sepsis.  EXAM: CHEST  2 VIEW  COMPARISON:  11/04/2014.  09/08/2014.  FINDINGS: Mediastinum and hilar structures are normal. Heart size stable. Left base subsegmental atelectasis and or mild infiltrate. Small left pleural effusion. Elevation of the left hemidiaphragm. No pneumothorax. No acute bony abnormality.  IMPRESSION: 1. Left lower lobe subsegmental atelectasis and or mild infiltrate. Small left pleural effusion. 2. Elevation of the left hemidiaphragm.   Electronically Signed   By: Marcello Moores  Register   On: 11/05/2014 08:09   Dg Chest 2 View  11/04/2014   CLINICAL DATA:  Shortness of breath. Currently undergoing radiation therapy for prostate cancer.  EXAM: CHEST  2 VIEW  COMPARISON:  09/08/2014 and 02/16/2014  FINDINGS: The heart size and mediastinal contours are within normal limits. Both lungs are clear. Previous resection of the distal right clavicle. Arthritic changes of the right shoulder.  IMPRESSION: No active cardiopulmonary disease.   Electronically Signed   By: Lorriane Shire M.D.   On: 11/04/2014 08:47   Ct Head Wo Contrast  11/18/2014   CLINICAL DATA:  Confusion.  Altered mental status.  EXAM: CT HEAD WITHOUT CONTRAST  TECHNIQUE: Contiguous axial images were obtained from the base of the skull through the vertex without intravenous contrast.  COMPARISON:  CT scan of September 08, 2014.  FINDINGS: Bony calvarium appears intact. No mass effect or midline shift is noted. Ventricular size is within normal limits. There is no evidence of mass lesion, hemorrhage or acute infarction.  IMPRESSION: Normal head CT.   Electronically Signed   By: Marijo Conception, M.D.   On: 11/18/2014 09:34    US Renal  11/17/2014   CLINICAL DATA:  Chronic renal insufficiency stage IV  EXAM: RENAL / URINARY TRACT ULTRASOUND COMPLETE  COMPARISON:  Abdominal ultrasound dated March 25, 2013  FINDINGS: Right Kidney:  Length: 10.9 cm. The renal cortical echotexture is increased and is equal to or exceeds that of the adjacent liver. There is no parenchymal mass or hydronephrosis.  Left Kidney:  Length: 11.1 cm the renal cortical echotexture is increased. There is no mass or hydronephrosis. Echogenicity within normal limits. No mass or hydronephrosis visualized.  Bladder:  Appears normal for degree of bladder distention. The urinary bladder wall measures approximately 5.5 mm.  IMPRESSION: Increased renal cortical echotexture consistent with medical renal disease. There is no hydronephrosis.   Electronically Signed   By: David  Martinique M.D.   On: 11/17/2014 10:30   Dg Chest Port 1 View  11/19/2014   CLINICAL DATA:  Shortness of Breath  EXAM: PORTABLE CHEST - 1 VIEW  COMPARISON:  11/17/2014  FINDINGS: Cardiac shadow is mildly enlarged but stable. The lungs are well aerated bilaterally. Mild interstitial changes are again seen similar to that noted on the prior study. No focal confluent infiltrate is seen.  IMPRESSION: Mild persistent interstitial change although somewhat improved when compared with the prior exam. No focal infiltrate is noted.   Electronically Signed   By: Inez Catalina M.D.   On: 11/19/2014 07:50   Dg Chest Port 1 View  11/13/2014   CLINICAL DATA:  Shortness of breath for 1 day. Former smoker. Congestive heart failure. Hypertension.  EXAM: PORTABLE CHEST - 1 VIEW  COMPARISON:  11/05/2014  FINDINGS: Mild elevation of left hemidiaphragm remains stable. No evidence of pulmonary consolidation or edema. Mild cardiomegaly stable. No definite evidence of pleural effusion.  IMPRESSION: Mild elevation of left hemidiaphragm again noted.  No acute findings   Electronically Signed   By: Earle Gell M.D.   On:  11/13/2014 07:04     CBC  Recent Labs Lab 11/15/14 0228 11/16/14 0518 11/17/14 0636 11/19/14 0345 11/20/14 0400  WBC 9.9 13.7* 10.7* 12.3* 11.8*  HGB 7.0* 8.0* 8.5* 9.4* 9.8*  HCT 22.0* 24.4* 26.1* 29.0* 30.2*  PLT 118* 128* 105* 107* 120*  MCV 83.0 82.4 83.1 82.4 83.0  MCH 26.4 27.0 27.1 26.7 26.9  MCHC 31.8 32.8 32.6 32.4 32.5  RDW 15.4 15.4 15.5 15.0 14.8  LYMPHSABS 0.3*  --  1.3  --   --   MONOABS 0.6  --  0.5  --   --   EOSABS 0.0  --  0.0  --   --   BASOSABS 0.0  --  0.0  --   --     Chemistries   Recent Labs Lab 11/15/14 0228  11/17/14 0636 11/17/14 1011 11/18/14 0610 11/19/14 0345 11/20/14 0400 11/21/14 0448  NA 139  < > 139  --  142 140 136 135  K 3.8  < > 4.1  --  3.8 3.5 3.5 4.4  CL 111  < > 111  --  108 103 97* 97*  CO2 16*  < > 17*  --  23 24 28 28   GLUCOSE 176*  < > 140*  --  133* 163* 152* 148*  BUN 85*  < > 94*  --  77* 66* 56* 51*  CREATININE 2.69*  < > 2.61*  --  2.39* 2.35* 2.28* 2.42*  CALCIUM 8.5*  < > 8.5*  --  9.0 8.9 8.6* 8.6*  MG 2.2  --   --  2.0  --   --  1.3* 2.1  < > = values in this interval not displayed. ------------------------------------------------------------------------------------------------------------------ estimated creatinine clearance is 28.9 mL/min (by C-G formula based on Cr of 2.42). ------------------------------------------------------------------------------------------------------------------ No results for input(s): HGBA1C in the last 72 hours. ------------------------------------------------------------------------------------------------------------------ No results for input(s): CHOL, HDL, LDLCALC, TRIG, CHOLHDL, LDLDIRECT in the last 72 hours. ------------------------------------------------------------------------------------------------------------------ No results for input(s): TSH, T4TOTAL, T3FREE, THYROIDAB in the last 72 hours.  Invalid input(s):  FREET3 ------------------------------------------------------------------------------------------------------------------ No results for input(s): VITAMINB12, FOLATE, FERRITIN, TIBC, IRON, RETICCTPCT in the last 72 hours.  Coagulation profile No results for input(s): INR, PROTIME in the last 168 hours.  No results for input(s): DDIMER in the last 72 hours.  Cardiac Enzymes  Recent Labs Lab 11/17/14 0636  TROPONINI 0.11*   ------------------------------------------------------------------------------------------------------------------ Invalid input(s): POCBNP   Time Spent in minutes  35   SINGH,PRASHANT K M.D on 11/21/2014 at 10:33 AM  Between 7am to 7pm - Pager - [phone]  After 7pm go to [email or web address] - password [facility]  [facility] -  Office  [phone]

## 2014-11-21 NOTE — Clinical Social Work Note (Signed)
Clinical Social Work Assessment  Patient Details  Name: Gerald Gomez MRN: [id number] Date of Birth: 31-Mar-1940  Date of referral:                  Reason for consult:  Facility Placement                Permission sought to share information with:  Chartered certified accountant granted to share information::  Yes, Verbal Permission Granted  Name::        Agency::     Relationship::     Contact Information:     Housing/Transportation Living arrangements for the past 2 months:  Single Family Home Source of Information:  Spouse Patient Interpreter Needed:  None Criminal Activity/Legal Involvement Pertinent to Current Situation/Hospitalization:  No - Comment as needed Significant Relationships:  Spouse Lives with:  Spouse Do you feel safe going back to the place where you live?  Yes Need for family participation in patient care:  Yes (Comment)  Care giving concerns:  Pt's wife unable to care for pt in his current state and pt unable to care for self.  Pt's wife requests SNF for rehab.  Social Worker assessment / plan:CSW spoke with pt's wife over the phone re: role of CSW/discharge planning.  Placement process discussed, as well as pt's Medicare SNF benefits. Pt's wife agreeable to NHP in [place] and may prefer [place].  CSW will continue to follow and facilitate NH tx as apporpriate. Employment status:  Retired Forensic scientist:  [employer] PT Recommendations:  [place] / Referral to community resources:  [place]  Patient/Family's Response to care: wife agreeable to bed search with eventual placement for pt in [employer] [facility]. Patient/Family's Understanding of and Emotional Response to Diagnosis, Current Treatment, and Prognosis: Pt's wife realizes that she cannot physically handle pt by herself at home at this time.  She is hopeful that he will be able to return home following therapy at the [facility]. Emotional  Assessment Appearance:  Appears stated age Attitude/Demeanor/Rapport:  Unable to Assess Affect (typically observed):   (unable to assess) Orientation:  Oriented to Self, Oriented to Place, Oriented to Situation Alcohol / Substance use:  Not Applicable Psych involvement (Current and /or in the community):  No (Comment)  Discharge Needs  Concerns to be addressed:  Discharge Planning Concerns Readmission within the last 30 days:  Yes Current discharge risk:  None Barriers to Discharge:  No Barriers Identified   Roanna Raider, LCSW 11/21/2014, 4:23 PM

## 2014-11-22 ENCOUNTER — Inpatient Hospital Stay (HOSPITAL_COMMUNITY): Payer: Medicare Other

## 2014-11-22 ENCOUNTER — Encounter (HOSPITAL_BASED_OUTPATIENT_CLINIC_OR_DEPARTMENT_OTHER): Admission: RE | Payer: Self-pay | Source: Ambulatory Visit

## 2014-11-22 ENCOUNTER — Ambulatory Visit: Payer: Medicare Other

## 2014-11-22 ENCOUNTER — Ambulatory Visit (HOSPITAL_BASED_OUTPATIENT_CLINIC_OR_DEPARTMENT_OTHER): Admission: RE | Admit: 2014-11-22 | Payer: Medicare Other | Source: Ambulatory Visit | Admitting: Urology

## 2014-11-22 LAB — BASIC METABOLIC PANEL
ANION GAP: 11 (ref 5–15)
BUN: 55 mg/dL — ABNORMAL HIGH (ref 6–20)
CHLORIDE: 99 mmol/L — AB (ref 101–111)
CO2: 26 mmol/L (ref 22–32)
Calcium: 8.9 mg/dL (ref 8.9–10.3)
Creatinine, Ser: 2.5 mg/dL — ABNORMAL HIGH (ref 0.61–1.24)
GFR calc non Af Amer: 24 mL/min — ABNORMAL LOW (ref >60)
GFR, EST AFRICAN AMERICAN: 27 mL/min — AB (ref >60)
Glucose, Bld: 133 mg/dL — ABNORMAL HIGH (ref 65–99)
Potassium: 3.8 mmol/L (ref 3.5–5.1)
Sodium: 136 mmol/L (ref 135–145)

## 2014-11-22 LAB — URIC ACID: Uric Acid, Serum: 11.7 mg/dL — ABNORMAL HIGH (ref 4.4–7.6)

## 2014-11-22 LAB — MAGNESIUM: Magnesium: 2.1 mg/dL (ref 1.7–2.4)

## 2014-11-22 SURGERY — INSERTION, RADIATION SOURCE, PROSTATE
Anesthesia: General

## 2014-11-22 MED ORDER — COLCHICINE 0.6 MG PO TABS
0.6000 mg | ORAL_TABLET | Freq: Two times a day (BID) | ORAL | Status: DC
  Administered 2014-11-22 – 2014-11-24 (×5): 0.6 mg via ORAL
  Filled 2014-11-22 (×6): qty 1

## 2014-11-22 MED ORDER — HALOPERIDOL LACTATE 5 MG/ML IJ SOLN: 4.0000 mg | Freq: Once | INTRAMUSCULAR | Status: DC

## 2014-11-22 MED ORDER — TRAMADOL HCL 50 MG PO TABS
50.0000 mg | ORAL_TABLET | Freq: Four times a day (QID) | ORAL | Status: DC | PRN
  Administered 2014-11-22 – 2014-11-23 (×3): 50 mg via ORAL
  Filled 2014-11-22 (×3): qty 1

## 2014-11-22 MED ORDER — HALOPERIDOL LACTATE 5 MG/ML IJ SOLN
2.0000 mg | Freq: Once | INTRAMUSCULAR | Status: AC
  Administered 2014-11-22: 2 mg via INTRAVENOUS
  Filled 2014-11-22: qty 1

## 2014-11-22 NOTE — Consult Note (Signed)
Reason for Consult: Swelling and pain right knee with chronic symptoms Referring Physician: Dr. Suszanne Conners is an 74 y.o. male.  HPI: Patient is a 74 year old gentleman who states that his right knee became more painful after a recent thunderstorm. Patient complains of chronic pain in the right knee.  Past Medical History  Diagnosis Date  . ALLERGIC RHINITIS 03/28/2009  . ANXIETY 03/28/2009  . CHOLELITHIASIS 03/28/2009  . DEPRESSION 03/28/2009  . DIVERTICULOSIS, COLON 03/28/2009  . 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 FATIGUE 03/28/2009  . HYPERLIPIDEMIA 03/28/2009  . HYPERTENSION 03/28/2009  . LOW BACK PAIN, CHRONIC 03/28/2009  . PERIPHERAL VASCULAR DISEASE 03/28/2009  . Personal history of alcoholism 03/28/2009  . THROMBOCYTOPENIA 03/28/2009  . Wheezing 02/07/2010  . CHF (congestive heart failure)   . Impaired glucose tolerance 12/01/2013  . Heart murmur   . Exertional dyspnea 11/04/2014  . History of stomach ulcers   . HEPATITIS C 03/28/2009  . DEGENERATIVE JOINT DISEASE 03/28/2009  . [place] DISEASE, LUMBAR 03/28/2009  . Arthritis     "everywhere"  . Chronic kidney disease, stage 3     /notes 11/04/2014  . Chronic anemia     [name] 11/04/2014  . Thrombocytopenia     chronic/notes 11/04/2014  . Malignant neoplasm prostate     "in treatment for it now" (11/04/2014)  . Pneumonia     Sepsis- suspecting /notes 11/04/2014    Past Surgical History  Procedure Laterality Date  . Shoulder arthroscopy w/ rotator cuff repair Right 2003  . Cataract extraction w/ intraocular lens  implant, bilateral Bilateral   . Prostate biopsy    . Colonoscopy w/ polypectomy  06/29/2014    polpys proved to be non cancerous done at [facility] History  Problem Relation Age of Onset  . Lung cancer Father   . Stroke Brother   . Breast cancer Daughter     Social History:  reports that he quit smoking about 19 years ago. His smoking use included Cigarettes. He has a 41 pack-year smoking history. He has never  used smokeless tobacco. He reports that he drinks alcohol. He reports that he uses illicit drugs (Marijuana).  Allergies:  Allergies  Allergen Reactions  . Tramadol Nausea Only    Medications: I have reviewed the patient's current medications.  Results for orders placed or performed during the hospital encounter of 11/17/14 (from the past 48 hour(s))  Magnesium     Status: None   Collection Time: 11/21/14  4:48 AM  Result Value Ref Range   Magnesium 2.1 1.7 - 2.4 mg/dL  Basic metabolic panel     Status: Abnormal   Collection Time: 11/21/14  4:48 AM  Result Value Ref Range   Sodium 135 135 - 145 mmol/L   Potassium 4.4 3.5 - 5.1 mmol/L    Comment: DELTA CHECK NOTED   Chloride 97 (L) 101 - 111 mmol/L   CO2 28 22 - 32 mmol/L   Glucose, Bld 148 (H) 65 - 99 mg/dL   BUN 51 (H) 6 - 20 mg/dL   Creatinine, Ser 2.42 (H) 0.61 - 1.24 mg/dL   Calcium 8.6 (L) 8.9 - 10.3 mg/dL   GFR calc non Af Amer 25 (L) >60 mL/min   GFR calc Af Amer 28 (L) >60 mL/min    Comment: (NOTE) The eGFR has been calculated using the CKD EPI equation. This calculation has not been validated in all clinical situations. eGFR's persistently <60 mL/min signify possible Chronic Kidney Disease.  Anion gap 10 5 - 15  Basic metabolic panel     Status: Abnormal   Collection Time: 11/22/14  7:32 AM  Result Value Ref Range   Sodium 136 135 - 145 mmol/L   Potassium 3.8 3.5 - 5.1 mmol/L   Chloride 99 (L) 101 - 111 mmol/L   CO2 26 22 - 32 mmol/L   Glucose, Bld 133 (H) 65 - 99 mg/dL   BUN 55 (H) 6 - 20 mg/dL   Creatinine, Ser 2.50 (H) 0.61 - 1.24 mg/dL   Calcium 8.9 8.9 - 10.3 mg/dL   GFR calc non Af Amer 24 (L) >60 mL/min   GFR calc Af Amer 27 (L) >60 mL/min    Comment: (NOTE) The eGFR has been calculated using the CKD EPI equation. This calculation has not been validated in all clinical situations. eGFR's persistently <60 mL/min signify possible Chronic Kidney Disease.    Anion gap 11 5 - 15  Magnesium      Status: None   Collection Time: 11/22/14  7:32 AM  Result Value Ref Range   Magnesium 2.1 1.7 - 2.4 mg/dL  Uric acid     Status: Abnormal   Collection Time: 11/22/14  7:32 AM  Result Value Ref Range   Uric Acid, Serum 11.7 (H) 4.4 - 7.6 mg/dL    No results found.  Review of Systems  All other systems reviewed and are negative.  Blood pressure 117/52, pulse 78, temperature 97.5 F (36.4 C), temperature source Oral, resp. rate 18, weight 89.449 kg (197 lb 3.2 oz), SpO2 98 %. Physical Exam On examination patient has no effusion of the left knee right knee he has a trace effusion he is tender to palpation of the medial and lateral joint lines patellofemoral joint is nontender to palpation there is minimal symptoms with active or passive range of motion of the knee. Patient states that he could not lay still to obtain an x-ray of his knee. Uric acid 11.7. Assessment/Plan: Assessment: Gouty arthritis right knee with very minimal effusion.  Plan: There does not appear to be enough fluid to draw out of his knee. Would treat him presuming this is strictly gout he does not have any signs of septic joint at this time. Would use the allopurinol and colchicine and I will follow-up in the office after discharge.  Hason Ofarrell V 11/22/2014, 7:02 PM

## 2014-11-22 NOTE — Progress Notes (Signed)
Pt refused to void all night, bladder scan 46ml. K schorr notified and new order for I/O cath. Pt notified and voided in bed soaking bed pads and sheets. Will continue to monitor. Ronnette Hila, RN

## 2014-11-22 NOTE — Progress Notes (Signed)
OT Cancellation Note  Patient Details Name: Gerald Gomez MRN: [id number] DOB: 1940/04/28   Cancelled Treatment:    Reason Eval/Treat Not Completed: Other (comment). Pt is Medicare and current D/C plan is SNF. No apparent immediate acute care OT needs, therefore will defer OT to SNF. If OT eval is needed please call Acute Rehab Dept. at [phone] or text page OT at [phone].    Almon Register [phone] 11/22/2014, 11:47 AM

## 2014-11-22 NOTE — Progress Notes (Signed)
Patient Demographics:    Gerald Gomez, is a 74 y.o. male, DOB - [birth year], MHD:[id number]  Admit date - 11/17/2014   Admitting Physician No admitting provider for patient encounter.  Outpatient Primary MD for the patient is Gerald Cower, MD  LOS - 5   Chief Complaint  Patient presents with  . Back Pain  . Shortness of Breath        Subjective:    [name] today has, No headache, No chest pain, No abdominal pain - No Nausea, No new weakness tingling or numbness, No Cough - SOB. Unreliable historian. Looks comfortable today. Complaining of some right knee pain.   Assessment  & Plan :    1. Acute on chronic hypoxic respiratory failure due to fluid overload from acute on chronic diastolic CHF EF 21%, patient signed out AMA one day before this hospitalization. Hypoxia improved on IV Lasix , now close to compensated, creatinine slightly up so will hold Lasix, continue beta blocker. Monitor salt intake and fluid intake. Monitor weight and BMP.  Filed Weights   11/20/14 0900 11/21/14 0454 11/22/14 0532  Weight: 89.994 kg (198 lb 6.4 oz) 88.4 kg (194 lb 14.2 oz) 89.449 kg (197 lb 3.2 oz)    2. CKD stage IV metabolic acidosis -his baseline creatinine is close to 2.5. Improving with diuresis continue Lasix and monitor. Renal ultrasound done upon admission is stable. Bicarbonate is steadily rising so I have dropped oral bicarbonate dose.   3. History of Parkinson's with Parkinson's dementia. Continue Sinemet, He has some delirium at this time, CT head nonacute, ABG stable, stable ammonia level, minimize narcotics, benzodiazepine's and monitor closely. Fall and aspiration precautions, Speech has been consulted. Possible UTI placed on Rocephin and we'll monitor. His delirium continues to improve and now  appears to be close to baseline.   4. Essential hypertension. Stable continue on beta blocker, Lasix and monitor.   5. Anemia of chronic disease. No acute issues supportive care.   6. History of prostate cancer undergoing radiation treatments under the care of Dr. [name] and [name]. Outpatient follow-up.   7. Chronic low back pain Will place on tramadol and avoid narcotics due to delirium.   8. Chronic thrombocytopenia likely due to history of hepatitis C. Monitor levels. No acute issue   9. History of hep C. Outpatient follow-up with GI and PCP.   10. Possible UTI. Placed on Rocephin monitor.   11. New onset SVT, paroxysmal atrial fibrillation and atrial flutter. All discussed with cardiologist Dr. Lovena Le on 11/20/2014. For now goal will be rate controlled, continue Cardizem along with beta blocker, placed on 81 mg of aspirin. He is an extremely poor candidate for anticoagulation due to his noncompliance, note he signed out AMA few days prior to this admission. Dementia with delirium with extremely high fall risk.   12. Non-ACS pattern troponin rise which was trending from 11/13/2014 last admission to 11/17/2014. Nonacute EKG, not a candidate for invasive procedures due to his underlying kidney problem along with poor functional status and high fall risk. Continue aspirin and Plavix. Remained chest pain-free throughout. Outpatient cartilage follow-up.   13. R Knee and Leg pain - knees tender on exam, uric acid is elevated at 11.7, suspicious for gout, x-rays ordered,  placed on culture seen, requested Dr. due to orthopedics to try and aspirate. If infection rule out might benefit from intra-articular steroid shot.     Code Status : Full  Family Communication  : wife in detail  Disposition Plan  : SNF recommended by PT  Consults  :  Social work  Procedures  :   CT head nonacute  DVT Prophylaxis  :  Lovenox    Lab Results  Component Value Date   PLT 120* 11/20/2014      Inpatient Medications  Scheduled Meds: . aspirin  81 mg Oral Daily  . carbidopa-levodopa  1 tablet Oral TID WC  . cefTRIAXone (ROCEPHIN)  IV  1 g Intravenous Q24H  . colchicine  0.6 mg Oral BID  . cycloSPORINE  1 drop Both Eyes BID  . diltiazem  300 mg Oral Daily  . enoxaparin (LOVENOX) injection  30 mg Subcutaneous Q24H  . fluticasone  2 spray Each Nare Daily  . hydrALAZINE  25 mg Oral 3 times per day  . metoprolol tartrate  25 mg Oral BID  . sodium bicarbonate  325 mg Oral Daily  . tamsulosin  0.4 mg Oral Daily   Continuous Infusions:  PRN Meds:.acetaminophen, albuterol, diltiazem, haloperidol lactate, hydrALAZINE, HYDROcodone-acetaminophen, traMADol  Antibiotics  :    Anti-infectives    Start     Dose/Rate Route Frequency Ordered Stop   11/18/14 0815  cefTRIAXone (ROCEPHIN) 1 g in dextrose 5 % 50 mL IVPB     1 g 100 mL/hr over 30 Minutes Intravenous Every 24 hours 11/18/14 0805          Objective:   Filed Vitals:   11/21/14 1501 11/21/14 2105 11/21/14 2207 11/22/14 0532  BP: 117/39 150/64  132/60  Pulse: 76 98  61  Temp: 97.7 F (36.5 C)  97.4 F (36.3 C) 97.8 F (36.6 C)  TempSrc: Oral  Oral Axillary  Resp: 20 18  20   Weight:    89.449 kg (197 lb 3.2 oz)  SpO2: 99% 97%  95%    Wt Readings from Last 3 Encounters:  11/22/14 89.449 kg (197 lb 3.2 oz)  11/15/14 101.107 kg (222 lb 14.4 oz)  11/07/14 98.7 kg (217 lb 9.5 oz)     Intake/Output Summary (Last 24 hours) at 11/22/14 1013 Last data filed at 11/22/14 0900  Gross per 24 hour  Intake   1020 ml  Output    301 ml  Net    719 ml     Physical Exam  Awake , pleasantly confused, No new F.N deficits, Normal affect [place].AT,PERRAL Supple Neck,No JVD, No cervical lymphadenopathy appriciated.  Symmetrical Chest wall movement, Good air movement bilaterally, CTAB iRRR,No Gallops,Rubs or new Murmurs, No Parasternal Heave +ve B.Sounds, Abd Soft, No tenderness, No organomegaly appriciated, No rebound -  guarding or rigidity. No Cyanosis, Clubbing or edema, No new Rash or bruise   Right knee has some effusion, tender to touch, no warmth or redness.   Data Review:   Micro Results Recent Results (from the past 240 hour(s))  Culture, blood (routine x 2)     Status: None   Collection Time: 11/13/14  6:40 AM  Result Value Ref Range Status   Specimen Description BLOOD RIGHT ANTECUBITAL  Final   Special Requests BOTTLES DRAWN AEROBIC AND ANAEROBIC 5CC  Final   Culture NO GROWTH 5 DAYS  Final   Report Status 11/18/2014 FINAL  Final  Culture, blood (routine x 2)     Status:  None   Collection Time: 11/13/14  6:45 AM  Result Value Ref Range Status   Specimen Description BLOOD LEFT HAND  Final   Special Requests BOTTLES DRAWN AEROBIC ONLY 3CC  Final   Culture NO GROWTH 5 DAYS  Final   Report Status 11/18/2014 FINAL  Final  Urine culture     Status: None   Collection Time: 11/13/14  7:23 AM  Result Value Ref Range Status   Specimen Description URINE, CLEAN CATCH  Final   Special Requests NONE  Final   Culture NO GROWTH 1 DAY  Final   Report Status 11/14/2014 FINAL  Final  MRSA PCR Screening     Status: None   Collection Time: 11/13/14  2:15 PM  Result Value Ref Range Status   MRSA by PCR NEGATIVE NEGATIVE Final    Comment:        The GeneXpert MRSA Assay (FDA approved for NASAL specimens only), is one component of a comprehensive MRSA colonization surveillance program. It is not intended to diagnose MRSA infection nor to guide or monitor treatment for MRSA infections.     Radiology Reports Dg Chest 2 View  11/17/2014   CLINICAL DATA:  Shortness of breath.  EXAM: CHEST  2 VIEW  COMPARISON:  November 14, 2014.  FINDINGS: Stable cardiomediastinal silhouette. No pneumothorax or pleural effusion is noted. Mildly increased central pulmonary vascular congestion is noted with bilateral perihilar at edema. Bony thorax is intact.  IMPRESSION: Mildly increased central pulmonary vascular  congestion with probable mild bilateral perihilar edema.   Electronically Signed   By: Marijo Conception, M.D.   On: 11/17/2014 07:10   X-ray Chest Pa And Lateral  11/14/2014   CLINICAL DATA:  Patient with acute respiratory failure.  EXAM: CHEST  2 VIEW  COMPARISON:  Chest radiograph 11/13/2014  FINDINGS: Stable enlarged cardiac and mediastinal contours. Low lung volumes. Bibasilar heterogeneous opacities. No pleural effusion or pneumothorax. Regional skeleton is unremarkable.  IMPRESSION: Cardiomegaly and pulmonary vascular redistribution. Basilar atelectasis.   Electronically Signed   By: Lovey Newcomer M.D.   On: 11/14/2014 09:53   Dg Chest 2 View  11/05/2014   CLINICAL DATA:  Sepsis.  EXAM: CHEST  2 VIEW  COMPARISON:  11/04/2014.  09/08/2014.  FINDINGS: Mediastinum and hilar structures are normal. Heart size stable. Left base subsegmental atelectasis and or mild infiltrate. Small left pleural effusion. Elevation of the left hemidiaphragm. No pneumothorax. No acute bony abnormality.  IMPRESSION: 1. Left lower lobe subsegmental atelectasis and or mild infiltrate. Small left pleural effusion. 2. Elevation of the left hemidiaphragm.   Electronically Signed   By: Marcello Moores  Register   On: 11/05/2014 08:09   Dg Chest 2 View  11/04/2014   CLINICAL DATA:  Shortness of breath. Currently undergoing radiation therapy for prostate cancer.  EXAM: CHEST  2 VIEW  COMPARISON:  09/08/2014 and 02/16/2014  FINDINGS: The heart size and mediastinal contours are within normal limits. Both lungs are clear. Previous resection of the distal right clavicle. Arthritic changes of the right shoulder.  IMPRESSION: No active cardiopulmonary disease.   Electronically Signed   By: Lorriane Shire M.D.   On: 11/04/2014 08:47   Ct Head Wo Contrast  11/18/2014   CLINICAL DATA:  Confusion.  Altered mental status.  EXAM: CT HEAD WITHOUT CONTRAST  TECHNIQUE: Contiguous axial images were obtained from the base of the skull through the vertex  without intravenous contrast.  COMPARISON:  CT scan of September 08, 2014.  FINDINGS: Bony calvarium  appears intact. No mass effect or midline shift is noted. Ventricular size is within normal limits. There is no evidence of mass lesion, hemorrhage or acute infarction.  IMPRESSION: Normal head CT.   Electronically Signed   By: Marijo Conception, M.D.   On: 11/18/2014 09:34   US Renal  11/17/2014   CLINICAL DATA:  Chronic renal insufficiency stage IV  EXAM: RENAL / URINARY TRACT ULTRASOUND COMPLETE  COMPARISON:  Abdominal ultrasound dated March 25, 2013  FINDINGS: Right Kidney:  Length: 10.9 cm. The renal cortical echotexture is increased and is equal to or exceeds that of the adjacent liver. There is no parenchymal mass or hydronephrosis.  Left Kidney:  Length: 11.1 cm the renal cortical echotexture is increased. There is no mass or hydronephrosis. Echogenicity within normal limits. No mass or hydronephrosis visualized.  Bladder:  Appears normal for degree of bladder distention. The urinary bladder wall measures approximately 5.5 mm.  IMPRESSION: Increased renal cortical echotexture consistent with medical renal disease. There is no hydronephrosis.   Electronically Signed   By: David  Martinique M.D.   On: 11/17/2014 10:30   Dg Chest Port 1 View  11/19/2014   CLINICAL DATA:  Shortness of Breath  EXAM: PORTABLE CHEST - 1 VIEW  COMPARISON:  11/17/2014  FINDINGS: Cardiac shadow is mildly enlarged but stable. The lungs are well aerated bilaterally. Mild interstitial changes are again seen similar to that noted on the prior study. No focal confluent infiltrate is seen.  IMPRESSION: Mild persistent interstitial change although somewhat improved when compared with the prior exam. No focal infiltrate is noted.   Electronically Signed   By: Inez Catalina M.D.   On: 11/19/2014 07:50   Dg Chest Port 1 View  11/13/2014   CLINICAL DATA:  Shortness of breath for 1 day. Former smoker. Congestive heart failure. Hypertension.  EXAM:  PORTABLE CHEST - 1 VIEW  COMPARISON:  11/05/2014  FINDINGS: Mild elevation of left hemidiaphragm remains stable. No evidence of pulmonary consolidation or edema. Mild cardiomegaly stable. No definite evidence of pleural effusion.  IMPRESSION: Mild elevation of left hemidiaphragm again noted.  No acute findings   Electronically Signed   By: Earle Gell M.D.   On: 11/13/2014 07:04     CBC  Recent Labs Lab 11/16/14 0518 11/17/14 0636 11/19/14 0345 11/20/14 0400  WBC 13.7* 10.7* 12.3* 11.8*  HGB 8.0* 8.5* 9.4* 9.8*  HCT 24.4* 26.1* 29.0* 30.2*  PLT 128* 105* 107* 120*  MCV 82.4 83.1 82.4 83.0  MCH 27.0 27.1 26.7 26.9  MCHC 32.8 32.6 32.4 32.5  RDW 15.4 15.5 15.0 14.8  LYMPHSABS  --  1.3  --   --   MONOABS  --  0.5  --   --   EOSABS  --  0.0  --   --   BASOSABS  --  0.0  --   --     Chemistries   Recent Labs Lab 11/17/14 1011 11/18/14 0610 11/19/14 0345 11/20/14 0400 11/21/14 0448 11/22/14 0732  NA  --  142 140 136 135 136  K  --  3.8 3.5 3.5 4.4 3.8  CL  --  108 103 97* 97* 99*  CO2  --  23 24 28 28 26   GLUCOSE  --  133* 163* 152* 148* 133*  BUN  --  77* 66* 56* 51* 55*  CREATININE  --  2.39* 2.35* 2.28* 2.42* 2.50*  CALCIUM  --  9.0 8.9 8.6* 8.6* 8.9  MG 2.0  --   --  1.3* 2.1 2.1   ------------------------------------------------------------------------------------------------------------------ estimated creatinine clearance is 28 mL/min (by C-G formula based on Cr of 2.5). ------------------------------------------------------------------------------------------------------------------ No results for input(s): HGBA1C in the last 72 hours. ------------------------------------------------------------------------------------------------------------------ No results for input(s): CHOL, HDL, LDLCALC, TRIG, CHOLHDL, LDLDIRECT in the last 72 hours. ------------------------------------------------------------------------------------------------------------------ No results  for input(s): TSH, T4TOTAL, T3FREE, THYROIDAB in the last 72 hours.  Invalid input(s): FREET3 ------------------------------------------------------------------------------------------------------------------ No results for input(s): VITAMINB12, FOLATE, FERRITIN, TIBC, IRON, RETICCTPCT in the last 72 hours.  Coagulation profile No results for input(s): INR, PROTIME in the last 168 hours.  No results for input(s): DDIMER in the last 72 hours.  Cardiac Enzymes  Recent Labs Lab 11/17/14 0636  TROPONINI 0.11*   ------------------------------------------------------------------------------------------------------------------ Invalid input(s): POCBNP   Time Spent in minutes  35   SINGH,PRASHANT K M.D on 11/22/2014 at 10:13 AM  Between 7am to 7pm - Pager - [phone]  After 7pm go to [email or web address] - password [place]  [facility] -  Office  [phone]

## 2014-11-22 NOTE — Care Management Important Message (Signed)
Important Message  Patient Details  Name: Gerald Gomez MRN: [id number] Date of Birth: [birth year]   Medicare Important Message Given:  Yes-second notification given    Pricilla Handler 11/22/2014, 3:34 PM

## 2014-11-22 NOTE — Progress Notes (Signed)
Pt will go down for xray, will give Haldol, qtc .43 per CCMD. We will continue to monitor.

## 2014-11-22 NOTE — Progress Notes (Signed)
Physical Therapy Treatment Patient Details Name: Gerald Gomez MRN: [id number] DOB: Aug 14, 1940 Today's Date: 11/22/2014    History of Present Illness 74 year old male patient with chronic kidney disease now at stage IV, known COPD and former tobacco abuse, prior history of alcoholism, known prostate cancer undergoing radiation therapy since May 2016, hypertension, anemia, chronic low back pain, Parkinson's disease, dyslipidemia, chronic thrombocytopenia, hepatitis C and GERD. Patient returns to the ER with increasing respiratory distress symptoms manifesting as dyspnea on exertion and orthopnea. Patient has been hospitalized twice in August for acute respiratory failure presumed related to COPD as well as diastolic heart failure. Patient left AGAINST MEDICAL ADVICE 8/23. According to the discharge summary patient did not want to stay to have any other treatment. He had a run of atrial fibrillation during the night as well. He reported to the ER physician increasing shortness of breath and low back pain.     PT Comments    Patient in bed, agreeable to participate in PT today. RN reports that he has been up in the chair and to the [facility], was recently returned to bed. Patient's cognition is much more intact today, however he is still restless and impulsive with his movements. Reports that these are due to his knee pain and discomfort in his back. He gave the knee pain a rating, failed to give rating for back pain, stating that he has "had it since he was born." He declined further exercise due to not wanting to hurt his knee. Patient will benefit from continued PT as mental status improves to progress towards ambulation and maintain strength while in the hospital.    Follow Up Recommendations  SNF;Supervision/Assistance - 24 hour     Equipment Recommendations  None recommended by PT (Pt reports being familiar with RW use.)    Recommendations for Other Services       Precautions / Restrictions  Precautions Precautions: Fall Restrictions Weight Bearing Restrictions: No    Mobility  Bed Mobility Overal bed mobility: Modified Independent Bed Mobility: Rolling Rolling: Modified independent (Device/Increase time)         General bed mobility comments: RN states patient just returned to bed after use of BSC and that he has been in the chair earlier today. More thorough bed mobility deferred for later session, but patient is able to roll around for self-repositioning with use of bed rails.  Transfers                    Ambulation/Gait                 Stairs            Wheelchair Mobility    Modified Rankin (Stroke Patients Only)       Balance                                    Cognition Arousal/Alertness: Awake/alert Behavior During Therapy: Restless Overall Cognitive Status: No family/caregiver present to determine baseline cognitive functioning                 General Comments: Patient more oriented today and less agitated. Still very restless, moving around repeatedly. Able to hold normal conversations.    Exercises General Exercises - Lower Extremity Ankle Circles/Pumps: AROM;20 reps;10 reps;Supine Heel Slides: AROM;10 reps;Both;Supine Straight Leg Raises: PROM;Left;5 reps;Supine (R LE too painful)    General Comments  Pertinent Vitals/Pain Pain Assessment: 0-10 Pain Score: 7  Pain Location: R knee Pain Descriptors / Indicators: Aching;Constant;Discomfort Pain Intervention(s): Limited activity within patient's tolerance;Monitored during session;Repositioned    Home Living                      Prior Function            PT Goals (current goals can now be found in the care plan section) Acute Rehab PT Goals PT Goal Formulation: With patient Time For Goal Achievement: 12/03/14 Potential to Achieve Goals: Good Progress towards PT goals: Progressing toward goals    Frequency  Min  2X/week    PT Plan Current plan remains appropriate    Co-evaluation             End of Session   Activity Tolerance: Patient limited by fatigue;Patient limited by pain Patient left: in bed;with call bell/phone within reach;with bed alarm set;with nursing/sitter in room     Time: 1347-1404 PT Time Calculation (min) (ACUTE ONLY): 17 min  Charges:  $Therapeutic Exercise: 8-22 mins                    G CodesRoanna Epley, SPT [phone] 11/22/2014, 2:29 PM  I have read, reviewed and agree with student's note.   [place] [phone] (pager)

## 2014-11-22 NOTE — Consult Note (Signed)
   [facility] Inpatient Consult   11/22/2014  Gerald Gomez 1940/05/14 [id number] Referral received to assess for care management services for community follow up.  Met with the patient and wife, [name] regarding the benefits of [facility] [place] [facility] services. Review information for [facility] Care Management and a folder was provided with contact information. [name] states that she was seeking a rehab stay for the patient and she was not sure if this would be short term or long term.  Explained that [facility] does not interfere with or replace any services arranged by the inpatient care management staff.  [name] declined services with [facility] Care Management at this time, requested a brochure and contact information and this was given.  Encouraged her to follow up with his primary care provider or directly if his needs and disposition changes.  For questions, please contact: Natividad Brood, RN BSN [facility] Liaison  [phone] business mobile phone

## 2014-11-22 NOTE — Progress Notes (Signed)
Noted that patient remains hospitalized. Informed Emily, RT for L1. Prostate radiation treatment cancelled for today.

## 2014-11-23 ENCOUNTER — Ambulatory Visit: Payer: Medicare Other

## 2014-11-23 ENCOUNTER — Ambulatory Visit: Payer: Medicare Other | Admitting: Neurology

## 2014-11-23 LAB — BASIC METABOLIC PANEL
ANION GAP: 11 (ref 5–15)
BUN: 57 mg/dL — ABNORMAL HIGH (ref 6–20)
CHLORIDE: 93 mmol/L — AB (ref 101–111)
CO2: 26 mmol/L (ref 22–32)
Calcium: 8.7 mg/dL — ABNORMAL LOW (ref 8.9–10.3)
Creatinine, Ser: 2.56 mg/dL — ABNORMAL HIGH (ref 0.61–1.24)
GFR calc Af Amer: 27 mL/min — ABNORMAL LOW (ref >60)
GFR calc non Af Amer: 23 mL/min — ABNORMAL LOW (ref >60)
GLUCOSE: 145 mg/dL — AB (ref 65–99)
POTASSIUM: 3.8 mmol/L (ref 3.5–5.1)
Sodium: 130 mmol/L — ABNORMAL LOW (ref 135–145)

## 2014-11-23 LAB — CBC
HCT: 32.4 % — ABNORMAL LOW (ref 39.0–52.0)
Hemoglobin: 10.3 g/dL — ABNORMAL LOW (ref 13.0–17.0)
MCH: 26.8 pg (ref 26.0–34.0)
MCHC: 31.8 g/dL (ref 30.0–36.0)
MCV: 84.2 fL (ref 78.0–100.0)
Platelets: 127 10*3/uL — ABNORMAL LOW (ref 150–400)
RBC: 3.85 MIL/uL — ABNORMAL LOW (ref 4.22–5.81)
RDW: 14.7 % (ref 11.5–15.5)
WBC: 7.6 10*3/uL (ref 4.0–10.5)

## 2014-11-23 MED ORDER — OXYCODONE HCL 5 MG PO TABS
5.0000 mg | ORAL_TABLET | Freq: Four times a day (QID) | ORAL | Status: DC | PRN
  Administered 2014-11-23 – 2014-11-24 (×3): 5 mg via ORAL
  Filled 2014-11-23 (×3): qty 1

## 2014-11-23 MED ORDER — QUETIAPINE FUMARATE 25 MG PO TABS
25.0000 mg | ORAL_TABLET | Freq: Two times a day (BID) | ORAL | Status: DC
  Administered 2014-11-23 – 2014-11-24 (×3): 25 mg via ORAL
  Filled 2014-11-23 (×4): qty 1

## 2014-11-23 MED ORDER — FUROSEMIDE 10 MG/ML IJ SOLN
40.0000 mg | Freq: Once | INTRAMUSCULAR | Status: AC
  Administered 2014-11-23: 40 mg via INTRAVENOUS
  Filled 2014-11-23: qty 4

## 2014-11-23 MED ORDER — FUROSEMIDE 40 MG PO TABS
40.0000 mg | ORAL_TABLET | Freq: Two times a day (BID) | ORAL | Status: DC
  Administered 2014-11-23 – 2014-11-24 (×2): 40 mg via ORAL
  Filled 2014-11-23 (×3): qty 1

## 2014-11-23 NOTE — Progress Notes (Signed)
Pt had nit urinated all shift.  Sitter, nurse tech and myself got patient up to bedside commode. Pt had large Bm and urinated a large amount of urine.  Pt continues to be agitated.  Haldol was given earlier, and pt relaxed for about an hour, then he continued to be very agitated.

## 2014-11-23 NOTE — Progress Notes (Signed)
Unable to weigh pt in scale, pt cannot tolerate standing d/t pain in R knee. Sitting right now in the recliner and not wanting to go back to bed.

## 2014-11-23 NOTE — Progress Notes (Signed)
Patient Demographics:    Gerald Gomez, is a 74 y.o. male, DOB - 1940/08/01, ERD:[id number]  Admit date - 11/17/2014   Admitting Physician No admitting provider for patient encounter.  Outpatient Primary MD for the patient is [name], MD  LOS - 6  Summary  This is a pleasant 74 year old African-American gentleman with history of chronic diastolic dysfunction, CK D stage V, Parkinson's disease with dementia, essential hypertension, prostate cancer who had recently signed out AMA 2 days prior to this admission was brought in for shortness of breath and edema due to acute on chronic diastolic dysfunction. He is being diuresed with good effect. Shortness of breath has resolved and he is off of oxygen, his current issue is on-and-off delirium due to his underlying dementia. 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 Orthopedic surgeon Dr. Sharol Given on board.  He got really educated 2 nights ago and is requiring a Actuary. Has been placed on Seroquel and as needed Haldol. If delirium is better sitter will be discharged and patient can be placed to SNF. He has no headache or focal deficits. Delirium in daytime today is much improved.  Chief Complaint  Patient presents with  . Back Pain  . Shortness of Breath        Subjective:    Gerald Gomez today has, No headache, No chest pain, No abdominal pain - No Nausea, No new weakness tingling or numbness, No Cough - SOB. Unreliable historian. Looks comfortable today. Complaining of some right knee pain.   Assessment  & Plan :    1. Acute on chronic hypoxic respiratory failure due to fluid overload from acute on chronic diastolic CHF EF 56%, patient signed out AMA one day before this hospitalization. Hypoxia improved on IV  Lasix , until new diuresis, had dropped Lasix dose on 11/22/2014 but developed mild hyponatremia and edema on the low-dose hands I have increased his Lasix to twice a day again on 11/23/2014, at her weight and BMP closely.  Filed Weights   11/21/14 0454 11/22/14 0532 11/23/14 0500  Weight: 88.4 kg (194 lb 14.2 oz) 89.449 kg (197 lb 3.2 oz) 90.039 kg (198 lb 8 oz)    2. CKD stage IV metabolic acidosis -his baseline creatinine is close to 2.5. Stable with diuresis continue Lasix and monitor. Renal ultrasound done upon admission is stable. Bicarbonate was steadily rising so I have dropped oral bicarbonate dose.   3. History of Parkinson's with Parkinson's dementia. Continue Sinemet, He has some delirium at this time, CT head nonacute, ABG stable, stable ammonia level, minimize narcotics, benzodiazepine's and monitor closely. Fall and aspiration precautions, Speech has been consulted. Possible UTI placed on Rocephin and we'll monitor. He is now better in daytime but still is delirious at night, placed on Seroquel on 11/23/2014. If stable will discontinue bedside sitter and look into placement.   4. Essential hypertension. Stable continue on beta blocker, Lasix and monitor.   5. Anemia of chronic disease. No acute issues supportive care.   6. History of prostate cancer undergoing radiation treatments under the care of Dr. [name] and [name]. Outpatient follow-up.   7. Chronic low back pain . Supportive care, trying to minimize narcotic use due  to delirium. Stable round..   8. Chronic thrombocytopenia likely due to history of hepatitis C. Monitor levels. No acute issue   9. History of hep C. Outpatient follow-up with GI and PCP.   10. Possible UTI. Treated with Rocephin x 5.     11. New onset SVT, paroxysmal atrial fibrillation and atrial flutter. All discussed with cardiologist Dr. Lovena Le on 11/20/2014. For now goal will be rate controlled, continue Cardizem along with beta blocker,  placed on 81 mg of aspirin. He is an extremely poor candidate for anticoagulation due to his noncompliance, note he signed out AMA few days prior to this admission. Dementia with delirium with extremely high fall risk.   12. Non-ACS pattern troponin rise which was trending from 11/13/2014 last admission to 11/17/2014. Nonacute EKG, not a candidate for invasive procedures due to his underlying kidney problem along with poor functional status and high fall risk. Continue aspirin and Plavix. Remained chest pain-free throughout. Outpatient cartilage follow-up.   13. R Knee pain - knees tender on exam, uric acid is elevated at 11.7, suspicious for gout, was nonacute, as been placed on Colchicine with some improvement, seen by orthopedic surgeon Dr. Sharol Given. Not enough fluid to be tapped. Clinically this does not look like an infected knee however since we don't have a fluid sample I will hesitate on placing him in steroids. Continue Colchicine, once acute phase is better placed on allopurinol. Likely pain Will improve in the next 1-2 days.     Code Status : Full  Family Communication  : wife in detail  Disposition Plan  : SNF recommended by PT  Consults  :  Social work  Procedures  :   CT head nonacute  DVT Prophylaxis  :  Lovenox    Lab Results  Component Value Date   PLT 127* 11/23/2014    Inpatient Medications  Scheduled Meds: . aspirin  81 mg Oral Daily  . carbidopa-levodopa  1 tablet Oral TID WC  . cefTRIAXone (ROCEPHIN)  IV  1 g Intravenous Q24H  . colchicine  0.6 mg Oral BID  . cycloSPORINE  1 drop Both Eyes BID  . diltiazem  300 mg Oral Daily  . enoxaparin (LOVENOX) injection  30 mg Subcutaneous Q24H  . fluticasone  2 spray Each Nare Daily  . furosemide  40 mg Oral BID  . haloperidol lactate  4 mg Intramuscular Once  . hydrALAZINE  25 mg Oral 3 times per day  . metoprolol tartrate  25 mg Oral BID  . QUEtiapine  25 mg Oral BID  . sodium bicarbonate  325 mg Oral Daily  .  tamsulosin  0.4 mg Oral Daily   Continuous Infusions:  PRN Meds:.acetaminophen, albuterol, diltiazem, haloperidol lactate, hydrALAZINE, HYDROcodone-acetaminophen, oxyCODONE  Antibiotics  :    Anti-infectives    Start     Dose/Rate Route Frequency Ordered Stop   11/18/14 0815  cefTRIAXone (ROCEPHIN) 1 g in dextrose 5 % 50 mL IVPB     1 g 100 mL/hr over 30 Minutes Intravenous Every 24 hours 11/18/14 0805          Objective:   Filed Vitals:   11/22/14 2149 11/23/14 0500 11/23/14 0638 11/23/14 0818  BP: 146/54  149/74 142/54  Pulse: 60  69 77  Temp: 98.6 F (37 C)  97.7 F (36.5 C) 98.1 F (36.7 C)  TempSrc: Oral  Oral Oral  Resp: 18  20 18   Weight:  90.039 kg (198 lb 8 oz)  SpO2: 98%  99% 99%    Wt Readings from Last 3 Encounters:  11/23/14 90.039 kg (198 lb 8 oz)  11/15/14 101.107 kg (222 lb 14.4 oz)  11/07/14 98.7 kg (217 lb 9.5 oz)     Intake/Output Summary (Last 24 hours) at 11/23/14 1019 Last data filed at 11/23/14 0830  Gross per 24 hour  Intake    830 ml  Output    926 ml  Net    -96 ml     Physical Exam  Awake , pleasantly confused, No new F.N deficits, Normal affect [place].AT,PERRAL Supple Neck,No JVD, No cervical lymphadenopathy appriciated.  Symmetrical Chest wall movement, Good air movement bilaterally, CTAB iRRR,No Gallops,Rubs or new Murmurs, No Parasternal Heave +ve B.Sounds, Abd Soft, No tenderness, No organomegaly appriciated, No rebound - guarding or rigidity. No Cyanosis, Clubbing or edema, No new Rash or bruise   Right knee has some effusion, tender to touch, no warmth or redness.   Data Review:   Micro Results Recent Results (from the past 240 hour(s))  MRSA PCR Screening     Status: None   Collection Time: 11/13/14  2:15 PM  Result Value Ref Range Status   MRSA by PCR NEGATIVE NEGATIVE Final    Comment:        The GeneXpert MRSA Assay (FDA approved for NASAL specimens only), is one component of a comprehensive MRSA  colonization surveillance program. It is not intended to diagnose MRSA infection nor to guide or monitor treatment for MRSA infections.     Radiology Reports Dg Chest 2 View  11/17/2014   CLINICAL DATA:  Shortness of breath.  EXAM: CHEST  2 VIEW  COMPARISON:  November 14, 2014.  FINDINGS: Stable cardiomediastinal silhouette. No pneumothorax or pleural effusion is noted. Mildly increased central pulmonary vascular congestion is noted with bilateral perihilar at edema. Bony thorax is intact.  IMPRESSION: Mildly increased central pulmonary vascular congestion with probable mild bilateral perihilar edema.   Electronically Signed   By: Marijo Conception, M.D.   On: 11/17/2014 07:10   X-ray Chest Pa And Lateral  11/14/2014   CLINICAL DATA:  Patient with acute respiratory failure.  EXAM: CHEST  2 VIEW  COMPARISON:  Chest radiograph 11/13/2014  FINDINGS: Stable enlarged cardiac and mediastinal contours. Low lung volumes. Bibasilar heterogeneous opacities. No pleural effusion or pneumothorax. Regional skeleton is unremarkable.  IMPRESSION: Cardiomegaly and pulmonary vascular redistribution. Basilar atelectasis.   Electronically Signed   By: Lovey Newcomer M.D.   On: 11/14/2014 09:53   Dg Chest 2 View  11/05/2014   CLINICAL DATA:  Sepsis.  EXAM: CHEST  2 VIEW  COMPARISON:  11/04/2014.  09/08/2014.  FINDINGS: Mediastinum and hilar structures are normal. Heart size stable. Left base subsegmental atelectasis and or mild infiltrate. Small left pleural effusion. Elevation of the left hemidiaphragm. No pneumothorax. No acute bony abnormality.  IMPRESSION: 1. Left lower lobe subsegmental atelectasis and or mild infiltrate. Small left pleural effusion. 2. Elevation of the left hemidiaphragm.   Electronically Signed   By: Marcello Moores  Register   On: 11/05/2014 08:09   Dg Chest 2 View  11/04/2014   CLINICAL DATA:  Shortness of breath. Currently undergoing radiation therapy for prostate cancer.  EXAM: CHEST  2 VIEW  COMPARISON:   09/08/2014 and 02/16/2014  FINDINGS: The heart size and mediastinal contours are within normal limits. Both lungs are clear. Previous resection of the distal right clavicle. Arthritic changes of the right shoulder.  IMPRESSION: No active cardiopulmonary disease.  Electronically Signed   By: Lorriane Shire M.D.   On: 11/04/2014 08:47   Dg Tibia/fibula Right  11/22/2014   CLINICAL DATA:  Right anterior lower leg pain.  EXAM: RIGHT TIBIA AND FIBULA - 2 VIEW  COMPARISON:  None.  FINDINGS: Negative for fracture, dislocation or radiopaque foreign body. There is no bone lesion or bony destruction. There are extensive vascular calcifications. There is chondrocalcinosis about the knee, incompletely imaged.  IMPRESSION: No significant bony abnormality. Extensive vascular calcifications are present.   Electronically Signed   By: Andreas Newport M.D.   On: 11/22/2014 21:21   Ct Head Wo Contrast  11/18/2014   CLINICAL DATA:  Confusion.  Altered mental status.  EXAM: CT HEAD WITHOUT CONTRAST  TECHNIQUE: Contiguous axial images were obtained from the base of the skull through the vertex without intravenous contrast.  COMPARISON:  CT scan of September 08, 2014.  FINDINGS: Bony calvarium appears intact. No mass effect or midline shift is noted. Ventricular size is within normal limits. There is no evidence of mass lesion, hemorrhage or acute infarction.  IMPRESSION: Normal head CT.   Electronically Signed   By: Marijo Conception, M.D.   On: 11/18/2014 09:34   US Renal  11/17/2014   CLINICAL DATA:  Chronic renal insufficiency stage IV  EXAM: RENAL / URINARY TRACT ULTRASOUND COMPLETE  COMPARISON:  Abdominal ultrasound dated March 25, 2013  FINDINGS: Right Kidney:  Length: 10.9 cm. The renal cortical echotexture is increased and is equal to or exceeds that of the adjacent liver. There is no parenchymal mass or hydronephrosis.  Left Kidney:  Length: 11.1 cm the renal cortical echotexture is increased. There is no mass or  hydronephrosis. Echogenicity within normal limits. No mass or hydronephrosis visualized.  Bladder:  Appears normal for degree of bladder distention. The urinary bladder wall measures approximately 5.5 mm.  IMPRESSION: Increased renal cortical echotexture consistent with medical renal disease. There is no hydronephrosis.   Electronically Signed   By: David  Martinique M.D.   On: 11/17/2014 10:30   Dg Chest Port 1 View  11/19/2014   CLINICAL DATA:  Shortness of Breath  EXAM: PORTABLE CHEST - 1 VIEW  COMPARISON:  11/17/2014  FINDINGS: Cardiac shadow is mildly enlarged but stable. The lungs are well aerated bilaterally. Mild interstitial changes are again seen similar to that noted on the prior study. No focal confluent infiltrate is seen.  IMPRESSION: Mild persistent interstitial change although somewhat improved when compared with the prior exam. No focal infiltrate is noted.   Electronically Signed   By: Inez Catalina M.D.   On: 11/19/2014 07:50   Dg Chest Port 1 View  11/13/2014   CLINICAL DATA:  Shortness of breath for 1 day. Former smoker. Congestive heart failure. Hypertension.  EXAM: PORTABLE CHEST - 1 VIEW  COMPARISON:  11/05/2014  FINDINGS: Mild elevation of left hemidiaphragm remains stable. No evidence of pulmonary consolidation or edema. Mild cardiomegaly stable. No definite evidence of pleural effusion.  IMPRESSION: Mild elevation of left hemidiaphragm again noted.  No acute findings   Electronically Signed   By: Earle Gell M.D.   On: 11/13/2014 07:04   Dg Knee Complete 4 Views Right  11/22/2014   CLINICAL DATA:  Acute superimposed upon chronic right knee pain. No known injury.  EXAM: RIGHT KNEE - COMPLETE 4+ VIEW  COMPARISON:  09/08/2013.  FINDINGS: No evidence of acute or subacute fracture or dislocation. Joint spaces well preserved for age. Chondrocalcinosis involving the lateral and medial  menisci. Bone mineral density well-preserved. No intrinsic osseous abnormality. Small joint effusion.  Femoropopliteal atherosclerosis. No significant interval change.  IMPRESSION: 1. No acute or subacute osseous abnormality. 2. CPPD.  No evidence of significant superimposed osteoarthritis. 3. Small joint effusion.   Electronically Signed   By: Evangeline Dakin M.D.   On: 11/22/2014 21:19     CBC  Recent Labs Lab 11/17/14 0636 11/19/14 0345 11/20/14 0400 11/23/14 0543  WBC 10.7* 12.3* 11.8* 7.6  HGB 8.5* 9.4* 9.8* 10.3*  HCT 26.1* 29.0* 30.2* 32.4*  PLT 105* 107* 120* 127*  MCV 83.1 82.4 83.0 84.2  MCH 27.1 26.7 26.9 26.8  MCHC 32.6 32.4 32.5 31.8  RDW 15.5 15.0 14.8 14.7  LYMPHSABS 1.3  --   --   --   MONOABS 0.5  --   --   --   EOSABS 0.0  --   --   --   BASOSABS 0.0  --   --   --     Chemistries   Recent Labs Lab 11/17/14 1011  11/19/14 0345 11/20/14 0400 11/21/14 0448 11/22/14 0732 11/23/14 0543  NA  --   < > 140 136 135 136 130*  K  --   < > 3.5 3.5 4.4 3.8 3.8  CL  --   < > 103 97* 97* 99* 93*  CO2  --   < > 24 28 28 26 26   GLUCOSE  --   < > 163* 152* 148* 133* 145*  BUN  --   < > 66* 56* 51* 55* 57*  CREATININE  --   < > 2.35* 2.28* 2.42* 2.50* 2.56*  CALCIUM  --   < > 8.9 8.6* 8.6* 8.9 8.7*  MG 2.0  --   --  1.3* 2.1 2.1  --   < > = values in this interval not displayed. ------------------------------------------------------------------------------------------------------------------ estimated creatinine clearance is 27.4 mL/min (by C-G formula based on Cr of 2.56). ------------------------------------------------------------------------------------------------------------------ No results for input(s): HGBA1C in the last 72 hours. ------------------------------------------------------------------------------------------------------------------ No results for input(s): CHOL, HDL, LDLCALC, TRIG, CHOLHDL, LDLDIRECT in the last 72 hours. ------------------------------------------------------------------------------------------------------------------ No results  for input(s): TSH, T4TOTAL, T3FREE, THYROIDAB in the last 72 hours.  Invalid input(s): FREET3 ------------------------------------------------------------------------------------------------------------------ No results for input(s): VITAMINB12, FOLATE, FERRITIN, TIBC, IRON, RETICCTPCT in the last 72 hours.  Coagulation profile No results for input(s): INR, PROTIME in the last 168 hours.  No results for input(s): DDIMER in the last 72 hours.  Cardiac Enzymes  Recent Labs Lab 11/17/14 0636  TROPONINI 0.11*   ------------------------------------------------------------------------------------------------------------------ Invalid input(s): POCBNP   Time Spent in minutes  35   Ambrea Hegler K M.D on 11/23/2014 at 10:19 AM  Between 7am to 7pm - Pager - [phone]  After 7pm go to [email or web address] - password [email or web address]  [facility] -  Office  [phone]

## 2014-11-23 NOTE — Progress Notes (Signed)
CSW spoke with patient's wife Gerald Gomez and notified her of anticipated d/c tomorrow to SNF if stable.  She has requested placement at [facility] and bed offer in place and accepted. She was asked to go to the facility at 3 pm tomorrow to sign admission papers.  Patient is currently sitter free per discussion with nursing.  MD: please sign FL2 on front of chart and plan d/c tomorrow if medically stable. Thanks!  Lorie Phenix. Pauline Good, [place]

## 2014-11-23 NOTE — Progress Notes (Deleted)
CSW notified of possible stability for d/c to SNF tomorrow. Per nursing notes- patient still has a Actuary and has experienced agitation today.  Unable to place patient with sitter in place.  Fl2 on chart- MD- please sign.  Family prefers [facility]. Awaiting clarification from SNF that patient can be admitted there (when sitter free for 24 hours). Lorie Phenix. Pauline Good, [place]

## 2014-11-23 NOTE — Progress Notes (Signed)
Pt stated that he is restless and unable to sleep. Trying to get out of bed. Baltazar Najjar made aware and new orders given. Pt is currently calm and sleeping, cooperate at times. Restraint not applied. We will continue to monitor.

## 2014-11-24 ENCOUNTER — Ambulatory Visit: Payer: Medicare Other

## 2014-11-24 DIAGNOSIS — J9601 Acute respiratory failure with hypoxia: Secondary | ICD-10-CM

## 2014-11-24 DIAGNOSIS — I421 Obstructive hypertrophic cardiomyopathy: Secondary | ICD-10-CM

## 2014-11-24 DIAGNOSIS — I5031 Acute diastolic (congestive) heart failure: Secondary | ICD-10-CM

## 2014-11-24 DIAGNOSIS — I10 Essential (primary) hypertension: Secondary | ICD-10-CM

## 2014-11-24 LAB — BASIC METABOLIC PANEL
ANION GAP: 11 (ref 5–15)
BUN: 54 mg/dL — ABNORMAL HIGH (ref 6–20)
CHLORIDE: 96 mmol/L — AB (ref 101–111)
CO2: 25 mmol/L (ref 22–32)
Calcium: 8.6 mg/dL — ABNORMAL LOW (ref 8.9–10.3)
Creatinine, Ser: 2.61 mg/dL — ABNORMAL HIGH (ref 0.61–1.24)
GFR calc non Af Amer: 22 mL/min — ABNORMAL LOW (ref >60)
GFR, EST AFRICAN AMERICAN: 26 mL/min — AB (ref >60)
Glucose, Bld: 146 mg/dL — ABNORMAL HIGH (ref 65–99)
POTASSIUM: 3.7 mmol/L (ref 3.5–5.1)
SODIUM: 132 mmol/L — AB (ref 135–145)

## 2014-11-24 LAB — MAGNESIUM: MAGNESIUM: 1.8 mg/dL (ref 1.7–2.4)

## 2014-11-24 MED ORDER — QUETIAPINE FUMARATE 25 MG PO TABS: 25.0000 mg | ORAL_TABLET | Freq: Two times a day (BID) | ORAL | Status: DC

## 2014-11-24 MED ORDER — FUROSEMIDE 20 MG PO TABS: 20.0000 mg | ORAL_TABLET | Freq: Every day | ORAL | Status: DC

## 2014-11-24 MED ORDER — HYDROCODONE-ACETAMINOPHEN 5-325 MG PO TABS: 1.0000 | ORAL_TABLET | ORAL | Status: DC | PRN

## 2014-11-24 MED ORDER — FUROSEMIDE 40 MG PO TABS: 40.0000 mg | ORAL_TABLET | Freq: Every morning | ORAL | Status: DC

## 2014-11-24 MED ORDER — DILTIAZEM HCL ER COATED BEADS 300 MG PO CP24: 300.0000 mg | ORAL_CAPSULE | Freq: Every day | ORAL | Status: DC

## 2014-11-24 MED ORDER — ASPIRIN 81 MG PO CHEW: 81.0000 mg | CHEWABLE_TABLET | Freq: Every day | ORAL | Status: DC

## 2014-11-24 NOTE — Progress Notes (Signed)
Patient quiet most of night after getting Haldol. Had periods of confusion. But rested well.

## 2014-11-24 NOTE — Progress Notes (Signed)
Report called to Nurse Rollene Fare at [facility].

## 2014-11-24 NOTE — Discharge Summary (Signed)
Physician Discharge Summary  Gerald Gomez XHB:[id number] DOB: October 05, 1940 DOA: 11/17/2014  PCP: [name], MD  Admit date: 11/17/2014 Discharge date: 11/24/2014  Recommendations for Outpatient Follow-up:  1. Pt will need to follow up with PCP in 2 weeks post discharge 2. Please obtain BMP on 11/26/2014  Discharge Diagnoses:   1. Acute on chronic hypoxic respiratory failure due to fluid overload from acute on chronic diastolic CHF EF 81%, patient signed out AMA one day before this hospitalization. Hypoxia improved on IV Lasix , until new diuresis, had dropped Lasix dose on 11/22/2014 but developed mild hyponatremia and edema on the low-dose hands I have increased his Lasix to twice a day again on 11/23/2014,  -Patient will be discharged with furosemide 40 mg in the morning, 20 mg in the afternoon -Please check BMP 11/26/2014 -Neg 6.8L for the admission -The patient was stable on room air on the day of discharge with nausea saturation 97-98 percent -11/15/2014 echocardiogram showed EF 65-70 percent with dynamic obstructive outflow tract consistent with his history of HOCM  2. CKD stage IV metabolic acidosis -his baseline creatinine is close to 2.5. Stable with diuresis continue Lasix and monitor. Renal ultrasound done upon admission is stable.  Bicarbonate initially 17 at the time of admission, but gradually increased with diuresis and supplementation Discontinue bicarbonate at the time of discharge and repeat BMP 11/26/2014 -Baseline creatinine 2.2-2.5   3. History of Parkinson's with Parkinson's dementia. Continue Sinemet, He has some delirium at this time, CT head nonacute, ABG stable, stable ammonia level, minimize narcotics, benzodiazepine's and monitor closely. Fall and aspiration precautions, Speech has been consulted. Possible UTI placed on Rocephin and we'll monitor. He is now better in daytime but still is delirious at night, placed on Seroquel on 11/23/2014. If stable will  discontinue bedside sitter and look into placement. -The patient's delirium was multifactorial including sundowning, renal insufficiency, and his CHF decompensation -The patient was monitored without a sitter for over 24 hours prior to discharge  4. Essential hypertension. Stable continue on beta blocker, Lasix and monitor.   5. Anemia of chronic disease. No acute issues supportive care.   6. History of prostate cancer undergoing radiation treatments under the care of Dr. [name] and [name]. Outpatient follow-up.   7. Chronic low back pain . Supportive care, trying to minimize narcotic use due to delirium. Stable round..   8. Chronic thrombocytopenia likely due to history of hepatitis C. Monitor levels. No acute issue   9. History of hep C. Outpatient follow-up with GI and PCP.   10. Possible UTI. Treated with Rocephin x 5d.  -Will not discharged on any antibiotics   11. New onset SVT, paroxysmal atrial fibrillation and atrial flutter. All discussed with cardiologist Dr. Lovena Le on 11/20/2014. For now goal will be rate controlled, continue Cardizem along with beta blocker, placed on 81 mg of aspirin. He is an extremely poor candidate for anticoagulation due to his noncompliance, note he signed out AMA few days prior to this admission. Dementia with delirium with extremely high fall risk. -Aspirin 81 mg daily   12. Non-ACS pattern troponin rise which was trending from 11/13/2014 last admission to 11/17/2014. Nonacute EKG, not a candidate for invasive procedures due to his underlying kidney problem along with poor functional status and high fall risk. Continue aspirin and Plavix. Remained chest pain-free throughout. Outpatient cardiology follow-up.       Discharge Condition: stable  Disposition: SNF Follow-up Information    Follow up with GeraldMARCUS V, MD In  1 week.   Specialty:  Orthopedic Surgery   Contact information:   [address] [place]  [address] [phone]       Diet:heart healthy Wt Readings from Last 3 Encounters:  11/24/14 89.4 kg (197 lb 1.5 oz)  11/15/14 101.107 kg (222 lb 14.4 oz)  11/07/14 98.7 kg (217 lb 9.5 oz)    History of present illness:  74 year old African-American gentleman with history of chronic diastolic dysfunction, CK D stage Gomez, Parkinson's disease with dementia, essential hypertension, prostate cancer who had recently signed out AMA 2 days prior to this admission was brought in for shortness of breath and edema due to acute on chronic diastolic dysfunction. He is being diuresed with good effect. Shortness of breath has resolved and he is off of oxygen, his current issue is on-and-off delirium due to his underlying dementia. 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 Orthopedic surgeon Dr. Sharol Given on board who did not feel the patient had a septic knee. The patient's knee pain improved with colchicine. He will not be discharged with any additional colchicine at this time. Consider starting allopurinol 100 mg daily 1 week after discharge    Patient became agitated during nighttime  and is requiring a sitter. Has been placed on Seroquel and as needed Haldol. However he gradually improved. The patient did not require sitter for over 24 hours prior to discharge.  He has no headache or focal deficits. Delirium in daytime today is much improved.  Consultants: Dr. Sharol Given   Discharge Exam: Filed Vitals:   11/24/14 0342  BP: 159/77  Pulse: 61  Temp: 97.8 F (36.6 C)  Resp: 18   Filed Vitals:   11/23/14 0818 11/23/14 1412 11/23/14 2126 11/24/14 0342  BP: 142/54 148/60 124/75 159/77  Pulse: 77 67 83 61  Temp: 98.1 F (36.7 C) 98 F (36.7 C) 97.8 F (36.6 C) 97.8 F (36.6 C)  TempSrc: Oral Axillary Axillary Oral  Resp: 18 18 18 18   Weight:    89.4 kg (197 lb 1.5 oz)  SpO2: 99% 100% 99% 98%   General: Alert and awake, NAD, pleasant,  cooperative Cardiovascular: RRR, no rub, no gallop, no S3 Respiratory: Poor inspiratory effort but clear to auscultation  Abdomen:soft, nontender, nondistended, positive bowel sounds Extremities: No edema, No lymphangitis, no petechiae  Discharge Instructions      Discharge Instructions    Diet - low sodium heart healthy    Complete by:  As directed      Increase activity slowly    Complete by:  As directed             Medication List    STOP taking these medications        hydrocortisone 2.5 % ointment     hydrocortisone 2.5 % rectal cream  Commonly known as:  ANUSOL-HC     LORazepam 1 MG tablet  Commonly known as:  ATIVAN     morphine 15 MG tablet  Commonly known as:  MSIR     TOVIAZ 4 MG Tb24 tablet  Generic drug:  fesoterodine      TAKE these medications        albuterol 108 (90 BASE) MCG/ACT inhaler  Commonly known as:  PROVENTIL HFA;VENTOLIN HFA  Inhale 2 puffs into the lungs every 6 (six) hours as needed for wheezing or shortness of breath. Dispense Ventolin HFA please     aspirin 81 MG chewable tablet  Chew  1 tablet (81 mg total) by mouth daily.     carbidopa-levodopa 25-100 MG per tablet  Commonly known as:  SINEMET IR  Take 1 tablet by mouth 3 (three) times daily.     citalopram 10 MG tablet  Commonly known as:  CELEXA  take 1 tablet by mouth once daily     cycloSPORINE 0.05 % ophthalmic emulsion  Commonly known as:  RESTASIS  Place 1 drop into both eyes 2 (two) times daily.     Diclofenac Sodium 2 % Soln  Apply 1 pump twice daily.     diltiazem 300 MG 24 hr capsule  Commonly known as:  CARDIZEM CD  Take 1 capsule (300 mg total) by mouth daily.     fluticasone 50 MCG/ACT nasal spray  Commonly known as:  FLONASE  Place 2 sprays into both nostrils daily.     furosemide 40 MG tablet  Commonly known as:  LASIX  Take 1 tablet (40 mg total) by mouth every morning.     furosemide 20 MG tablet  Commonly known as:  LASIX  Take 1 tablet (20  mg total) by mouth daily with supper.     gabapentin 100 MG capsule  Commonly known as:  NEURONTIN  Take 1 capsule (100 mg total) by mouth 3 (three) times daily.     HYDROcodone-acetaminophen 5-325 MG per tablet  Commonly known as:  NORCO/VICODIN  Take 1 tablet by mouth every 4 (four) hours as needed for moderate pain.     metoprolol tartrate 25 MG tablet  Commonly known as:  LOPRESSOR  take 1 tablet by mouth twice a day     potassium chloride 10 MEQ tablet  Commonly known as:  K-DUR,KLOR-CON  Take 10 mEq by mouth daily.     prochlorperazine 10 MG tablet  Commonly known as:  COMPAZINE  Take 1 tablet (10 mg total) by mouth every 6 (six) hours as needed for nausea or vomiting.     QUEtiapine 25 MG tablet  Commonly known as:  SEROQUEL  Take 1 tablet (25 mg total) by mouth 2 (two) times daily.     tamsulosin 0.4 MG Caps capsule  Commonly known as:  FLOMAX  take 1 capsule by mouth once daily         The results of significant diagnostics from this hospitalization (including imaging, microbiology, ancillary and laboratory) are listed below for reference.    Significant Diagnostic Studies: Dg Chest 2 View  11/17/2014   CLINICAL DATA:  Shortness of breath.  EXAM: CHEST  2 VIEW  COMPARISON:  November 14, 2014.  FINDINGS: Stable cardiomediastinal silhouette. No pneumothorax or pleural effusion is noted. Mildly increased central pulmonary vascular congestion is noted with bilateral perihilar at edema. Bony thorax is intact.  IMPRESSION: Mildly increased central pulmonary vascular congestion with probable mild bilateral perihilar edema.   Electronically Signed   By: Marijo Conception, M.D.   On: 11/17/2014 07:10   X-ray Chest Pa And Lateral  11/14/2014   CLINICAL DATA:  Patient with acute respiratory failure.  EXAM: CHEST  2 VIEW  COMPARISON:  Chest radiograph 11/13/2014  FINDINGS: Stable enlarged cardiac and mediastinal contours. Low lung volumes. Bibasilar heterogeneous opacities. No  pleural effusion or pneumothorax. Regional skeleton is unremarkable.  IMPRESSION: Cardiomegaly and pulmonary vascular redistribution. Basilar atelectasis.   Electronically Signed   By: Lovey Newcomer M.D.   On: 11/14/2014 09:53   Dg Chest 2 View  11/05/2014   CLINICAL DATA:  Sepsis.  EXAM:  CHEST  2 VIEW  COMPARISON:  11/04/2014.  09/08/2014.  FINDINGS: Mediastinum and hilar structures are normal. Heart size stable. Left base subsegmental atelectasis and or mild infiltrate. Small left pleural effusion. Elevation of the left hemidiaphragm. No pneumothorax. No acute bony abnormality.  IMPRESSION: 1. Left lower lobe subsegmental atelectasis and or mild infiltrate. Small left pleural effusion. 2. Elevation of the left hemidiaphragm.   Electronically Signed   By: Marcello Moores  Register   On: 11/05/2014 08:09   Dg Chest 2 View  11/04/2014   CLINICAL DATA:  Shortness of breath. Currently undergoing radiation therapy for prostate cancer.  EXAM: CHEST  2 VIEW  COMPARISON:  09/08/2014 and 02/16/2014  FINDINGS: The heart size and mediastinal contours are within normal limits. Both lungs are clear. Previous resection of the distal right clavicle. Arthritic changes of the right shoulder.  IMPRESSION: No active cardiopulmonary disease.   Electronically Signed   By: Lorriane Shire M.D.   On: 11/04/2014 08:47   Dg Tibia/fibula Right  11/22/2014   CLINICAL DATA:  Right anterior lower leg pain.  EXAM: RIGHT TIBIA AND FIBULA - 2 VIEW  COMPARISON:  None.  FINDINGS: Negative for fracture, dislocation or radiopaque foreign body. There is no bone lesion or bony destruction. There are extensive vascular calcifications. There is chondrocalcinosis about the knee, incompletely imaged.  IMPRESSION: No significant bony abnormality. Extensive vascular calcifications are present.   Electronically Signed   By: Andreas Newport M.D.   On: 11/22/2014 21:21   Ct Head Wo Contrast  11/18/2014   CLINICAL DATA:  Confusion.  Altered mental status.   EXAM: CT HEAD WITHOUT CONTRAST  TECHNIQUE: Contiguous axial images were obtained from the base of the skull through the vertex without intravenous contrast.  COMPARISON:  CT scan of September 08, 2014.  FINDINGS: Bony calvarium appears intact. No mass effect or midline shift is noted. Ventricular size is within normal limits. There is no evidence of mass lesion, hemorrhage or acute infarction.  IMPRESSION: Normal head CT.   Electronically Signed   By: Marijo Conception, M.D.   On: 11/18/2014 09:34   US Renal  11/17/2014   CLINICAL DATA:  Chronic renal insufficiency stage IV  EXAM: RENAL / URINARY TRACT ULTRASOUND COMPLETE  COMPARISON:  Abdominal ultrasound dated March 25, 2013  FINDINGS: Right Kidney:  Length: 10.9 cm. The renal cortical echotexture is increased and is equal to or exceeds that of the adjacent liver. There is no parenchymal mass or hydronephrosis.  Left Kidney:  Length: 11.1 cm the renal cortical echotexture is increased. There is no mass or hydronephrosis. Echogenicity within normal limits. No mass or hydronephrosis visualized.  Bladder:  Appears normal for degree of bladder distention. The urinary bladder wall measures approximately 5.5 mm.  IMPRESSION: Increased renal cortical echotexture consistent with medical renal disease. There is no hydronephrosis.   Electronically Signed   By: Lindley Hiney  [place] M.D.   On: 11/17/2014 10:30   Dg Chest Port 1 View  11/19/2014   CLINICAL DATA:  Shortness of Breath  EXAM: PORTABLE CHEST - 1 VIEW  COMPARISON:  11/17/2014  FINDINGS: Cardiac shadow is mildly enlarged but stable. The lungs are well aerated bilaterally. Mild interstitial changes are again seen similar to that noted on the prior study. No focal confluent infiltrate is seen.  IMPRESSION: Mild persistent interstitial change although somewhat improved when compared with the prior exam. No focal infiltrate is noted.   Electronically Signed   By: Inez Catalina M.D.   On:  11/19/2014 07:50   Dg Chest Port 1  View  11/13/2014   CLINICAL DATA:  Shortness of breath for 1 day. Former smoker. Congestive heart failure. Hypertension.  EXAM: PORTABLE CHEST - 1 VIEW  COMPARISON:  11/05/2014  FINDINGS: Mild elevation of left hemidiaphragm remains stable. No evidence of pulmonary consolidation or edema. Mild cardiomegaly stable. No definite evidence of pleural effusion.  IMPRESSION: Mild elevation of left hemidiaphragm again noted.  No acute findings   Electronically Signed   By: Earle Gell M.D.   On: 11/13/2014 07:04   Dg Knee Complete 4 Views Right  11/22/2014   CLINICAL DATA:  Acute superimposed upon chronic right knee pain. No known injury.  EXAM: RIGHT KNEE - COMPLETE 4+ VIEW  COMPARISON:  09/08/2013.  FINDINGS: No evidence of acute or subacute fracture or dislocation. Joint spaces well preserved for age. Chondrocalcinosis involving the lateral and medial menisci. Bone mineral density well-preserved. No intrinsic osseous abnormality. Small joint effusion. Femoropopliteal atherosclerosis. No significant interval change.  IMPRESSION: 1. No acute or subacute osseous abnormality. 2. CPPD.  No evidence of significant superimposed osteoarthritis. 3. Small joint effusion.   Electronically Signed   By: Evangeline Dakin M.D.   On: 11/22/2014 21:19     Microbiology: No results found for this or any previous visit (from the past 240 hour(s)).   Labs: Basic Metabolic Panel:  Recent Labs Lab 11/20/14 0400 11/21/14 0448 11/22/14 0732 11/23/14 0543 11/24/14 0524  NA 136 135 136 130* 132*  K 3.5 4.4 3.8 3.8 3.7  CL 97* 97* 99* 93* 96*  CO2 28 28 26 26 25   GLUCOSE 152* 148* 133* 145* 146*  BUN 56* 51* 55* 57* 54*  CREATININE 2.28* 2.42* 2.50* 2.56* 2.61*  CALCIUM 8.6* 8.6* 8.9 8.7* 8.6*  MG 1.3* 2.1 2.1  --  1.8   Liver Function Tests: No results for input(s): AST, ALT, ALKPHOS, BILITOT, PROT, ALBUMIN in the last 168 hours. No results for input(s): LIPASE, AMYLASE in the last 168 hours.  Recent Labs Lab  11/18/14 1113  AMMONIA 16   CBC:  Recent Labs Lab 11/19/14 0345 11/20/14 0400 11/23/14 0543  WBC 12.3* 11.8* 7.6  HGB 9.4* 9.8* 10.3*  HCT 29.0* 30.2* 32.4*  MCV 82.4 83.0 84.2  PLT 107* 120* 127*   Cardiac Enzymes: No results for input(s): CKTOTAL, CKMB, CKMBINDEX, TROPONINI in the last 168 hours. BNP: Invalid input(s): POCBNP CBG:  Recent Labs Lab 11/18/14 2239  GLUCAP 149*    Time coordinating discharge:  Greater than 30 minutes  Signed:  Senovia Gauer, DO [facility] Pager: [phone] 11/24/2014, 10:13 AM

## 2014-11-24 NOTE — Progress Notes (Signed)
Physical Therapy Treatment Patient Details Name: Gerald Gomez MRN: [id number] DOB: 17-Jul-1940 Today's Date: 11/24/2014    History of Present Illness 74 year old male patient with chronic kidney disease now at stage IV, known COPD and former tobacco abuse, prior history of alcoholism, known prostate cancer undergoing radiation therapy since May 2016, hypertension, anemia, chronic low back pain, Parkinson's disease, dyslipidemia, chronic thrombocytopenia, hepatitis C and GERD. Patient returns to the ER with increasing respiratory distress symptoms manifesting as dyspnea on exertion and orthopnea. Patient has been hospitalized twice in August for acute respiratory failure presumed related to COPD as well as diastolic heart failure. Patient left AGAINST MEDICAL ADVICE 8/23. According to the discharge summary patient did not want to stay to have any other treatment. He had a run of atrial fibrillation during the night as well. He reported to the ER physician increasing shortness of breath and low back pain.     PT Comments    Pt cooperated with therapy enough for transfer out of bed to chair but not following commands consistently for ambulation and not with safe posture to progress this. Required +2 assist for transfers. PT will continue to follow.     Follow Up Recommendations  SNF;Supervision/Assistance - 24 hour     Equipment Recommendations  None recommended by PT    Recommendations for Other Services       Precautions / Restrictions Precautions Precautions: Fall Precaution Comments: all rails up Restrictions Weight Bearing Restrictions: No    Mobility  Bed Mobility Overal bed mobility: Needs Assistance Bed Mobility: Rolling;Supine to Sit Rolling: Mod assist   Supine to sit: Mod assist     General bed mobility comments: attempted to get pt to roll to right side of bed from left (chair was on that side of bed), pt assisted to initiate roll but reported too much pain in right  knee and returned to left SL. Pt then assisted with supine to sit on left side, required mod A for legs off bed and sup to sit. Seemed more that pt did not want to get up than that he physically could not get to sitting  Transfers Overall transfer level: Needs assistance Equipment used: 2 person hand held assist Transfers: Sit to/from Stand;Stand Pivot Transfers Sit to Stand: +2 physical assistance;Max assist Stand pivot transfers: +2 physical assistance;Max assist       General transfer comment: Max A +2 for power up, encouraged pt to extend trunk to stretch back in full standing since he reported being achy all over but pt refused, maintaining flexed posture throughout sit to stand and SPT. Pt shuffled feet to chair and followed commands to reach back for chair. Descent to chair controlled by therapist because did not contorl.  Ambulation/Gait             General Gait Details: pt with unsafe posture for ambulation and would not stand all the way up   Stairs            Wheelchair Mobility    Modified Rankin (Stroke Patients Only)       Balance Overall balance assessment: Needs assistance Sitting-balance support: Single extremity supported Sitting balance-Leahy Scale: Poor Sitting balance - Comments: heavy lean on left bed rail, did not attempt unsupported sitting despite encouragement Postural control: Left lateral lean Standing balance support: Bilateral upper extremity supported Standing balance-Leahy Scale: Poor                      Cognition Arousal/Alertness:  Lethargic Behavior During Therapy: Flat affect Overall Cognitive Status: No family/caregiver present to determine baseline cognitive functioning Area of Impairment: Following commands;Safety/judgement;Problem solving   Current Attention Level: Sustained   Following Commands: Follows one step commands inconsistently Safety/Judgement: Decreased awareness of safety   Problem Solving: Decreased  initiation;Difficulty sequencing;Requires verbal cues;Requires tactile cues General Comments: difficult to assess cognition today with decreased verbalization    Exercises      General Comments General comments (skin integrity, edema, etc.): pt very self limiting      Pertinent Vitals/Pain Pain Assessment: Faces Faces Pain Scale: Hurts whole lot Pain Location: knees, arms, "all over" Pain Descriptors / Indicators: Aching Pain Intervention(s): Limited activity within patient's tolerance;Monitored during session    Home Living                      Prior Function            PT Goals (current goals can now be found in the care plan section) Acute Rehab PT Goals Patient Stated Goal: none stated PT Goal Formulation: With patient Time For Goal Achievement: 12/03/14 Potential to Achieve Goals: Good Progress towards PT goals: Not progressing toward goals - comment (pt not desiring more mobility, possibly cognitive issue?)    Frequency  Min 2X/week    PT Plan Current plan remains appropriate    Co-evaluation             End of Session Equipment Utilized During Treatment: Gait belt Activity Tolerance: Patient limited by pain Patient left: in chair;with chair alarm set;with call bell/phone within reach     Time: 0941-1002 PT Time Calculation (min) (ACUTE ONLY): 21 min  Charges:  $Therapeutic Activity: 8-22 mins                    G Codes:     Leighton Roach, PT  Acute Rehab Services  [place], [place] 11/24/2014, 11:01 AM

## 2014-11-24 NOTE — Clinical Social Work Placement (Signed)
   CLINICAL SOCIAL WORK PLACEMENT  NOTE  Date:  11/24/2014  Patient Details  Name: Gerald Gomez MRN: [id number] Date of Birth: 08-Jan-1941  Clinical Social Work is seeking post-discharge placement for this patient at the [place] level of care (*CSW will initial, date and re-position this form in  chart as items are completed):  Yes   Patient/family provided with [facility] Work Department's list of facilities offering this level of care within the geographic area requested by the patient (or if unable, by the patient's family).  Yes   Patient/family informed of their freedom to choose among providers that offer the needed level of care, that participate in Medicare, Medicaid or managed care program needed by the patient, have an available bed and are willing to accept the patient.      Patient/family informed of [facility]'s ownership interest in [facility] and [facility], as well as of the fact that they are under no obligation to receive care at these facilities.  PASRR submitted to EDS on       PASRR number received on       Existing PASRR number confirmed on 11/21/14     FL2 transmitted to all facilities in geographic area requested by pt/family on 11/21/14     FL2 transmitted to all facilities within larger geographic area on       Patient informed that his/her managed care company has contracts with or will negotiate with certain facilities, including the following:   (NA)     Yes   Patient/family informed of bed offers received.  Patient chooses bed at [facility] recommends and patient chooses bed at      Patient to be transferred to [facility] on 11/24/14.  Patient to be transferred to facility by Ambulance [facility])     Patient family notified on 11/24/14 of transfer.  Name of family member notified:  Wife:  Gerald Gomez     PHYSICIAN Please prepare priority discharge  summary, including medications, Please sign FL2, Please prepare prescriptions     Additional Comment: OK for d/c today to SNF per MD. Wife will go sign admission paperwork at facility at 3pm. EMS arranged and packet prepared. Nursing notified to call report to facility.  Patient is alert but confused- unable to fully understand d/c plan.  Wife is pleased with d/c to [facility] as this was her facility of choice.  No further CSW needs identified. CSW signing off.  Lorie Phenix. Murrell Redden [phone]      _______________________________________________ Williemae Area, LCSW 11/24/2014, 12:41 PM

## 2014-11-25 ENCOUNTER — Non-Acute Institutional Stay (SKILLED_NURSING_FACILITY): Payer: PRIVATE HEALTH INSURANCE | Admitting: Internal Medicine

## 2014-11-25 ENCOUNTER — Telehealth: Payer: Self-pay | Admitting: Radiation Oncology

## 2014-11-25 ENCOUNTER — Encounter: Payer: Self-pay | Admitting: Internal Medicine

## 2014-11-25 ENCOUNTER — Ambulatory Visit: Payer: Medicare Other

## 2014-11-25 DIAGNOSIS — D638 Anemia in other chronic diseases classified elsewhere: Secondary | ICD-10-CM | POA: Diagnosis not present

## 2014-11-25 DIAGNOSIS — I110 Hypertensive heart disease with heart failure: Secondary | ICD-10-CM | POA: Diagnosis not present

## 2014-11-25 DIAGNOSIS — N184 Chronic kidney disease, stage 4 (severe): Secondary | ICD-10-CM

## 2014-11-25 DIAGNOSIS — G8929 Other chronic pain: Secondary | ICD-10-CM

## 2014-11-25 DIAGNOSIS — G20 Parkinson's disease: Secondary | ICD-10-CM | POA: Diagnosis not present

## 2014-11-25 DIAGNOSIS — J9601 Acute respiratory failure with hypoxia: Secondary | ICD-10-CM

## 2014-11-25 DIAGNOSIS — C61 Malignant neoplasm of prostate: Secondary | ICD-10-CM | POA: Diagnosis not present

## 2014-11-25 DIAGNOSIS — M545 Low back pain, unspecified: Secondary | ICD-10-CM

## 2014-11-25 DIAGNOSIS — I480 Paroxysmal atrial fibrillation: Secondary | ICD-10-CM

## 2014-11-25 DIAGNOSIS — D696 Thrombocytopenia, unspecified: Secondary | ICD-10-CM

## 2014-11-25 DIAGNOSIS — I509 Heart failure, unspecified: Secondary | ICD-10-CM | POA: Diagnosis not present

## 2014-11-25 DIAGNOSIS — B182 Chronic viral hepatitis C: Secondary | ICD-10-CM

## 2014-11-25 DIAGNOSIS — I471 Supraventricular tachycardia: Secondary | ICD-10-CM

## 2014-11-25 NOTE — Telephone Encounter (Signed)
Noted patient has been discharged from [facility] to [facility]. Phoned wife, Kevan Ny, to inquire about future prostate radiation. No answer. Left message. Phoned [facility] and spoke with Edgardo Roys. Hilda Blades reports that the patient is being discharged from [facility] today. No other explanation provided. Informed Eve, RT on L1 to cancel patient for today. Will continue to try and reach wife for clarification.

## 2014-11-25 NOTE — Progress Notes (Signed)
MRN: [id number] Name: Gerald Gomez  Sex: male Age: 74 y.o. DOB: [birth year]  Kimberly #: Helene Kelp Facility/Room:119 Level Of Care: SNF Provider: Inocencio Homes D Emergency Contacts: Extended Emergency Contact Information Primary Emergency Contact: [facility] Address: [address]          [place], [place] [address] Johnnette Litter of [place] Phone: [phone] Relation: Spouse Secondary Emergency Contact: Augustin Schooling [place] of [place] Relation: Brother  Code Status: FULL  Allergies: Tramadol  Chief Complaint  Patient presents with  . New Admit To SNF     HPI: Patient is 74 y.o. male with history of chronic diastolic dysfunction, CK D stage V, Parkinson's disease with dementia, essential hypertension, prostate cancer who had recently signed out AMA 2 days prior to current hospital admission was brought in for shortness of breath and edema due to acute on chronic diastolic dysfunction. Pt was hospitalized from 8/24-31 for acute on chronic respiratory failure 2/2 CHF and acute on chronic stage 4 CKD with acidosis.Pt developed new onset SVT/PAF and flutter which will be rayte controlled with cardizem as well as b blocker with ASA as prophylaxis-pt not candidate for anticoagulation due to noncompliance with medical treatment and falls. 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 Patient became agitated during nighttime and was  placed on Seroquel and as needed Haldol with a night time sitter. When pt did not need a sitter for 24 hours pt was discharged to SNF for generalized weakness for OT/PT. While at SNF pt will be followed for parkinsons dx with parkinson's dementia, tx with sinemet, HTN, tx with bblocker, and lasix and chronic back pain with as little narcotic as possible.  Past Medical History  Diagnosis Date  . ALLERGIC RHINITIS 03/28/2009  . ANXIETY 03/28/2009  . CHOLELITHIASIS 03/28/2009  . DEPRESSION 03/28/2009  .  DIVERTICULOSIS, COLON 03/28/2009  . 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 FATIGUE 03/28/2009  . HYPERLIPIDEMIA 03/28/2009  . HYPERTENSION 03/28/2009  . LOW BACK PAIN, CHRONIC 03/28/2009  . PERIPHERAL VASCULAR DISEASE 03/28/2009  . Personal history of alcoholism 03/28/2009  . THROMBOCYTOPENIA 03/28/2009  . Wheezing 02/07/2010  . CHF (congestive heart failure)   . Impaired glucose tolerance 12/01/2013  . Heart murmur   . Exertional dyspnea 11/04/2014  . History of stomach ulcers   . HEPATITIS C 03/28/2009  . DEGENERATIVE JOINT DISEASE 03/28/2009  . [place] DISEASE, LUMBAR 03/28/2009  . Arthritis     "everywhere"  . Chronic kidney disease, stage 3     /notes 11/04/2014  . Chronic anemia     [name] 11/04/2014  . Thrombocytopenia     chronic/notes 11/04/2014  . Malignant neoplasm prostate     "in treatment for it now" (11/04/2014)  . Pneumonia     Sepsis- suspecting /notes 11/04/2014    Past Surgical History  Procedure Laterality Date  . Shoulder arthroscopy w/ rotator cuff repair Right 2003  . Cataract extraction w/ intraocular lens  implant, bilateral Bilateral   . Prostate biopsy    . Colonoscopy w/ polypectomy  06/29/2014    polpys proved to be non cancerous done at [facility]      Medication List       This list is accurate as of: 11/25/14 11:59 PM.  Always use your most recent med list.               albuterol 108 (90 BASE) MCG/ACT inhaler  Commonly known as:  PROVENTIL HFA;VENTOLIN HFA  Inhale  2 puffs into the lungs every 6 (six) hours as needed for wheezing or shortness of breath. Dispense Ventolin HFA please     aspirin 81 MG chewable tablet  Chew 1 tablet (81 mg total) by mouth daily.     carbidopa-levodopa 25-100 MG per tablet  Commonly known as:  SINEMET IR  Take 1 tablet by mouth 3 (three) times daily.     citalopram 10 MG tablet  Commonly known as:  CELEXA  take 1 tablet by mouth once daily     cycloSPORINE 0.05 % ophthalmic emulsion  Commonly known as:  RESTASIS  Place 1 drop  into both eyes 2 (two) times daily.     Diclofenac Sodium 2 % Soln  Apply 1 pump twice daily.     diltiazem 300 MG 24 hr capsule  Commonly known as:  CARDIZEM CD  Take 1 capsule (300 mg total) by mouth daily.     fluticasone 50 MCG/ACT nasal spray  Commonly known as:  FLONASE  Place 2 sprays into both nostrils daily.     furosemide 40 MG tablet  Commonly known as:  LASIX  Take 1 tablet (40 mg total) by mouth every morning.     furosemide 20 MG tablet  Commonly known as:  LASIX  Take 1 tablet (20 mg total) by mouth daily with supper.     gabapentin 100 MG capsule  Commonly known as:  NEURONTIN  Take 1 capsule (100 mg total) by mouth 3 (three) times daily.     HYDROcodone-acetaminophen 5-325 MG per tablet  Commonly known as:  NORCO/VICODIN  Take 1 tablet by mouth every 4 (four) hours as needed for moderate pain.     metoprolol tartrate 25 MG tablet  Commonly known as:  LOPRESSOR  take 1 tablet by mouth twice a day     potassium chloride 10 MEQ tablet  Commonly known as:  K-DUR,KLOR-CON  Take 10 mEq by mouth daily.     prochlorperazine 10 MG tablet  Commonly known as:  COMPAZINE  Take 1 tablet (10 mg total) by mouth every 6 (six) hours as needed for nausea or vomiting.     QUEtiapine 25 MG tablet  Commonly known as:  SEROQUEL  Take 1 tablet (25 mg total) by mouth 2 (two) times daily.     tamsulosin 0.4 MG Caps capsule  Commonly known as:  FLOMAX  take 1 capsule by mouth once daily        No orders of the defined types were placed in this encounter.    Immunization History  Administered Date(s) Administered  . Influenza Whole 12/24/2008, 02/07/2010  . Influenza,inj,Quad PF,36+ Mos 12/01/2013  . Influenza-Unspecified 01/24/2013  . Pneumococcal Conjugate-13 04/03/2013  . Pneumococcal Polysaccharide-23 03/28/2009  . Td 03/28/2009    Social History  Substance Use Topics  . Smoking status: Former Smoker -- 1.00 packs/day for 41 years    Types: Cigarettes     Quit date: 03/27/1995  . Smokeless tobacco: Never Used  . Alcohol Use: Yes     Comment: quit drinking 1997    Family history is + CA, stroke   Review of Systems  DATA OBTAINED: from patient, nurse; pt says he is going home GENERAL:  no fevers, fatigue, appetite changes SKIN: No itching, rash or wounds EYES: No eye pain, redness, discharge EARS: No earache, tinnitus, change in hearing NOSE: No congestion, drainage or bleeding  MOUTH/THROAT: No mouth or tooth pain, No sore throat RESPIRATORY: No cough, wheezing, SOB CARDIAC:  No chest pain, palpitations, lower extremity edema  GI: No abdominal pain, No N/V/D or constipation, No heartburn or reflux  GU: No dysuria, frequency or urgency, or incontinence  MUSCULOSKELETAL: No unrelieved bone/joint pain NEUROLOGIC: No headache, dizziness or focal weakness PSYCHIATRIC: No c/o anxiety or sadness   Filed Vitals:   11/25/14 1411  BP: 150/82  Pulse: 70  Temp: 98 F (36.7 C)  Resp: 20    SpO2 Readings from Last 1 Encounters:  11/24/14 99%        Physical Exam  GENERAL APPEARANCE: Alert, conversant, BM  No acute distress.  SKIN: No diaphoresis rash HEAD: Normocephalic, atraumatic  EYES: Conjunctiva/lids clear. Pupils round, reactive. EOMs intact.  EARS: External exam WNL, canals clear. Hearing grossly normal.  NOSE: No deformity or discharge.  MOUTH/THROAT: Lips w/o lesions  RESPIRATORY: Breathing is even, unlabored. Lung sounds are diffusely decreased , no rales   CARDIOVASCULAR: Heart irreg, 3/6 high pitched syslolic murmurmurmurs, rubs or gallops. trace peripheral edema.   GASTROINTESTINAL: Abdomen is soft, non-tender, not distended w/ normal bowel sounds. GENITOURINARY: Bladder non tender, not distended  MUSCULOSKELETAL: No abnormal joints or musculature NEUROLOGIC:  Cranial nerves 2-12 grossly intact. Moves all extremities  PSYCHIATRIC: pt exhibits poor insight, no behavioral issues  Patient Active Problem List    Diagnosis Date Noted  . Paroxysmal SVT (supraventricular tachycardia) 11/29/2014  . PAF (paroxysmal atrial fibrillation) 11/29/2014  . Anemia, chronic disease 11/29/2014  . Acute diastolic heart failure, NYHA class 2 11/17/2014  . CKD (chronic kidney disease), stage IV 11/17/2014  . HOCM (hypertrophic obstructive cardiomyopathy) 11/17/2014  . QT prolongation 11/17/2014  . CKD (chronic kidney disease) stage 4, GFR 15-29 ml/min   . Acute on chronic diastolic heart failure   . Metabolic acidosis 50/53/9767  . Acute respiratory failure with hypoxia 11/13/2014  . COPD (chronic obstructive pulmonary disease) 11/13/2014  . Malignant neoplasm of prostate 07/15/2014  . GERD (gastroesophageal reflux disease) 02/17/2014  . Acromioclavicular joint arthritis 02/15/2014  . Impaired glucose tolerance 12/01/2013  . PD (Parkinson's disease) 09/28/2013  . Chronic low back pain 09/01/2013  . 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 Recurrent falls 07/28/2013  . Chronic diastolic heart failure 34/19/3790  . Anemia in CKD (chronic kidney disease) 03/25/2013  . Overactive bladder 11/23/2011  . Left knee pain 04/10/2011  . Bladder neck obstruction 04/10/2011  . Preventative health care 10/01/2010  . 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 Hepatitis C 03/28/2009  . Hyperlipidemia 03/28/2009  . Thrombocytopenia 03/28/2009  . Anxiety state 03/28/2009  . Depression 03/28/2009  . Hypertensive heart disease with CHF 03/28/2009  . PERIPHERAL VASCULAR DISEASE 03/28/2009  . ALLERGIC RHINITIS 03/28/2009  . DIVERTICULOSIS, COLON 03/28/2009  . CHOLELITHIASIS 03/28/2009  . Osteoarthritis 03/28/2009  . [place] DISEASE, LUMBAR 03/28/2009  . LOW BACK PAIN, CHRONIC 03/28/2009  . FATIGUE 03/28/2009  . Personal history of alcoholism 03/28/2009    CBC    Component Value Date/Time   WBC 7.6 11/23/2014 0543   WBC 5.1 11/23/2008 1045   RBC 3.85* 11/23/2014 0543   RBC 5.05 11/23/2008 1045   HGB 10.3* 11/23/2014  0543   HGB 14.4 11/23/2008 1045   HCT 32.4* 11/23/2014 0543   HCT 43.1 11/23/2008 1045   PLT 127* 11/23/2014 0543   PLT 73* 11/23/2008 1045   MCV 84.2 11/23/2014 0543   MCV 85.3 11/23/2008 1045   LYMPHSABS 1.3 11/17/2014 0636   LYMPHSABS 2.4 11/23/2008 1045   MONOABS 0.5 11/17/2014 0636   MONOABS 0.6 11/23/2008 1045  EOSABS 0.0 11/17/2014 0636   EOSABS 0.2 11/23/2008 1045   BASOSABS 0.0 11/17/2014 0636   BASOSABS 0.0 11/23/2008 1045    CMP     Component Value Date/Time   NA 132* 11/24/2014 0524   K 3.7 11/24/2014 0524   CL 96* 11/24/2014 0524   CO2 25 11/24/2014 0524   GLUCOSE 146* 11/24/2014 0524   BUN 54* 11/24/2014 0524   CREATININE 2.61* 11/24/2014 0524   CALCIUM 8.6* 11/24/2014 0524   PROT 8.0 11/04/2014 0810   ALBUMIN 3.0* 11/04/2014 0810   AST 54* 11/04/2014 0810   ALT 16* 11/04/2014 0810   ALKPHOS 78 11/04/2014 0810   BILITOT 0.5 11/04/2014 0810   GFRNONAA 22* 11/24/2014 0524   GFRAA 26* 11/24/2014 0524    Lab Results  Component Value Date   HGBA1C 6.5 12/01/2013     Dg Chest Port 1 View  11/19/2014   CLINICAL DATA:  Shortness of Breath  EXAM: PORTABLE CHEST - 1 VIEW  COMPARISON:  11/17/2014  FINDINGS: Cardiac shadow is mildly enlarged but stable. The lungs are well aerated bilaterally. Mild interstitial changes are again seen similar to that noted on the prior study. No focal confluent infiltrate is seen.  IMPRESSION: Mild persistent interstitial change although somewhat improved when compared with the prior exam. No focal infiltrate is noted.   Electronically Signed   By: Inez Catalina M.D.   On: 11/19/2014 07:50    Not all labs, radiology exams or other studies done during hospitalization come through on my EPIC note; however they are reviewed by me.    Assessment and Plan  Acute respiratory failure with hypoxia due to fluid overload from acute on chronic diastolic CHF EF 08%, patient signed out AMA one day before this hospitalization. Hypoxia  improved on IV Lasix , until new diuresis, had dropped Lasix dose on 11/22/2014 but developed mild hyponatremia and edema on the low-dose hands I have increased his Lasix to twice a day again on 11/23/2014,  -Neg 6.8L for the admission -The patient was stable on room air on the day of discharge with nausea saturation 97-98 percent -11/15/2014 echocardiogram showed EF 65-70 percent with dynamic obstructive outflow tract consistent with his history of HOCM SNF plan - cont d/c dose of lasix, 40 mg qam, 20 mg q pm; will check BMP in 3 days  CKD (chronic kidney disease) stage 4, GFR 67-61 ml/min metabolic acidosis -his baseline creatinine is close to 2.5. Stable with diuresis continue Lasix and monitor. Renal ultrasound done upon admission is stable.  Bicarbonate initially 17 at the time of admission, but gradually increased with diuresis and supplementation SNF plan - repat NMP 3 days to check bicarb and renal fx  PD (Parkinson's disease)  He has some delirium in hosp, CT head nonacute, ABG stable, stable ammonia level, narcotics, benzodiazapines minimized. Fall and aspiration precautions,  Possible UTI placed on Rocephin. Improved in  daytime but still is delirious at night, placed on Seroquel on 11/23/2014. If stable will discontinue bedside sitter and look into placement. -The patient's delirium was multifactorial including sundowning, renal insufficiency, and his CHF decompensation SNF plan - cont sinemet and monitor for delerium  Hypertensive heart disease with CHF Stable; SNF plan -cont lopressor 25 mg daily and lasix BID  PAF (paroxysmal atrial fibrillation) New onset SVT, paroxysmal atrial fibrillation and atrial flutter. All discussed with cardiologist Dr. Lovena Le on 11/20/2014. For now goal will be rate controlled, continue Cardizem along with beta blocker, placed on 81 mg of  aspirin. He is an extremely poor candidate for anticoagulation due to his noncompliance, note he signed out AMA few  days prior to this admission. Dementia with delirium with extremely high fall risk SNF plan - conr cardizem 300 mg daily for rate and ASA 81 mg daily as prophylaxis  Hepatitis C Outpt f/u with GI  Malignant neoplasm of prostate Reported undergoing XRT with Dr [name] and Dr [name]  Thrombocytopenia Chronic SNF plan - CBC to check PLT   Anemia, chronic disease Reported stable; d/c HB 10.3; Plan - recheck CBC  Chronic low back pain Hosp rec as little narcotics as possible; SNF plan - pain control with tylenol with prn    Time spent > 45 min > 50% of time with patient was spent reviewing records, labs, tests and studies, counseling and developing plan of care  Hennie Duos, MD

## 2014-11-25 NOTE — Telephone Encounter (Signed)
Phoned patient's wife, [name]. She states, "[facility] wasn't giving her husband the care he needed so she brought him home today." She explains that home health and PT are being set up by [facility]. She hopes these will begin the first of the coming week. She states, "he really wants to complete his radiation." Will cancel radiation for the coming week and contact wife again on 9/9 to see if her husband is strong enough to resume.

## 2014-11-26 ENCOUNTER — Ambulatory Visit: Payer: Medicare Other

## 2014-11-26 ENCOUNTER — Telehealth: Payer: Self-pay | Admitting: *Deleted

## 2014-11-26 NOTE — Telephone Encounter (Signed)
Pt was on tcm list d/c 11/24/14 " Respiratory failure" pt was sent to SNF...Gerald Gomez

## 2014-11-29 DIAGNOSIS — I471 Supraventricular tachycardia, unspecified: Secondary | ICD-10-CM | POA: Insufficient documentation

## 2014-11-29 DIAGNOSIS — D638 Anemia in other chronic diseases classified elsewhere: Secondary | ICD-10-CM | POA: Insufficient documentation

## 2014-11-29 DIAGNOSIS — I480 Paroxysmal atrial fibrillation: Secondary | ICD-10-CM | POA: Insufficient documentation

## 2014-11-29 NOTE — Assessment & Plan Note (Signed)
Reported undergoing XRT with Dr [name] and Dr [name]

## 2014-11-29 NOTE — Assessment & Plan Note (Signed)
Reported stable; d/c HB 10.3; Plan - recheck CBC

## 2014-11-29 NOTE — Assessment & Plan Note (Signed)
due to fluid overload from acute on chronic diastolic CHF EF 07%, patient signed out AMA one day before this hospitalization. Hypoxia improved on IV Lasix , until new diuresis, had dropped Lasix dose on 11/22/2014 but developed mild hyponatremia and edema on the low-dose hands I have increased his Lasix to twice a day again on 11/23/2014,  -Neg 6.8L for the admission -The patient was stable on room air on the day of discharge with nausea saturation 97-98 percent -11/15/2014 echocardiogram showed EF 65-70 percent with dynamic obstructive outflow tract consistent with his history of HOCM SNF plan - cont d/c dose of lasix, 40 mg qam, 20 mg q pm; will check BMP in 3 days

## 2014-11-29 NOTE — Assessment & Plan Note (Signed)
He has some delirium in hosp, CT head nonacute, ABG stable, stable ammonia level, narcotics, benzodiazapines minimized. Fall and aspiration precautions,  Possible UTI placed on Rocephin. Improved in  daytime but still is delirious at night, placed on Seroquel on 11/23/2014. If stable will discontinue bedside sitter and look into placement. -The patient's delirium was multifactorial including sundowning, renal insufficiency, and his CHF decompensation SNF plan - cont sinemet and monitor for delerium

## 2014-11-29 NOTE — Assessment & Plan Note (Signed)
Stable; SNF plan -cont lopressor 25 mg daily and lasix BID

## 2014-11-29 NOTE — Assessment & Plan Note (Signed)
Chronic SNF plan - CBC to check PLT

## 2014-11-29 NOTE — Assessment & Plan Note (Addendum)
Hosp rec as little narcotics as possible; SNF plan - pain control with tylenol with prn norco

## 2014-11-29 NOTE — Assessment & Plan Note (Signed)
metabolic acidosis -his baseline creatinine is close to 2.5. Stable with diuresis continue Lasix and monitor. Renal ultrasound done upon admission is stable.  Bicarbonate initially 17 at the time of admission, but gradually increased with diuresis and supplementation SNF plan - repat NMP 3 days to check bicarb and renal fx

## 2014-11-29 NOTE — Assessment & Plan Note (Signed)
New onset SVT, paroxysmal atrial fibrillation and atrial flutter. All discussed with cardiologist Dr. Lovena Le on 11/20/2014. For now goal will be rate controlled, continue Cardizem along with beta blocker, placed on 81 mg of aspirin. He is an extremely poor candidate for anticoagulation due to his noncompliance, note he signed out AMA few days prior to this admission. Dementia with delirium with extremely high fall risk SNF plan - conr cardizem 300 mg daily for rate and ASA 81 mg daily as prophylaxis

## 2014-11-29 NOTE — Assessment & Plan Note (Signed)
Outpt f/u with GI

## 2014-11-30 ENCOUNTER — Ambulatory Visit: Payer: Medicare Other

## 2014-12-01 ENCOUNTER — Ambulatory Visit: Payer: Medicare Other

## 2014-12-01 ENCOUNTER — Ambulatory Visit: Payer: Medicare Other | Admitting: Internal Medicine

## 2014-12-01 ENCOUNTER — Telehealth: Payer: Self-pay | Admitting: Radiation Oncology

## 2014-12-01 NOTE — Telephone Encounter (Signed)
Patient's radiation has been on hold. Phoned wife to inquire about his status. Wife states, "I am exhausted." She goes on to say she feels he needs to return to a SNF. She reports her husband is weak, confused and "not breathing normal." Finally, she reports she plans to call for an ambulance or his PCP to get him re-admitted to the hospital. Informed Eve, RT on L1 of these findings.

## 2014-12-01 NOTE — Telephone Encounter (Signed)
Let's follow-up on this potential hospitalization before we decide. MM

## 2014-12-02 ENCOUNTER — Ambulatory Visit: Payer: Medicare Other

## 2014-12-03 ENCOUNTER — Ambulatory Visit: Payer: Medicare Other

## 2014-12-03 ENCOUNTER — Telehealth: Payer: Self-pay | Admitting: Radiation Oncology

## 2014-12-03 NOTE — Telephone Encounter (Signed)
Phoned patient's wife to inquire about his status. Wife reports they were unable to make it to his PCP appointment because he was "too weak to get in the car." Wife reports the patient remains very confused. Reports they rescheduled his appointment with the PCP for next week. Encouraged wife to phone 911 if his condition worsens. Wife verbalized understanding. Wife states, "I am so tired, I can't take care of him by myself, he needs to go to a facility." Offered to reach out to [facility] social workers for assistance and she declined. Encouraged her to call with future needs and she verbalized understanding.

## 2014-12-06 ENCOUNTER — Ambulatory Visit: Payer: Medicare Other

## 2014-12-07 ENCOUNTER — Encounter: Payer: Self-pay | Admitting: Internal Medicine

## 2014-12-07 ENCOUNTER — Ambulatory Visit (INDEPENDENT_AMBULATORY_CARE_PROVIDER_SITE_OTHER): Payer: Medicare Other | Admitting: Internal Medicine

## 2014-12-07 ENCOUNTER — Ambulatory Visit: Payer: Medicare Other

## 2014-12-07 ENCOUNTER — Other Ambulatory Visit (INDEPENDENT_AMBULATORY_CARE_PROVIDER_SITE_OTHER): Payer: Medicare Other

## 2014-12-07 VITALS — BP 118/62 | HR 85 | Temp 98.7°F

## 2014-12-07 DIAGNOSIS — F0391 Unspecified dementia with behavioral disturbance: Secondary | ICD-10-CM | POA: Diagnosis not present

## 2014-12-07 DIAGNOSIS — R7302 Impaired glucose tolerance (oral): Secondary | ICD-10-CM

## 2014-12-07 DIAGNOSIS — I5032 Chronic diastolic (congestive) heart failure: Secondary | ICD-10-CM

## 2014-12-07 DIAGNOSIS — N184 Chronic kidney disease, stage 4 (severe): Secondary | ICD-10-CM | POA: Diagnosis not present

## 2014-12-07 DIAGNOSIS — F03918 Unspecified dementia, unspecified severity, with other behavioral disturbance: Secondary | ICD-10-CM | POA: Insufficient documentation

## 2014-12-07 DIAGNOSIS — R531 Weakness: Secondary | ICD-10-CM

## 2014-12-07 DIAGNOSIS — R269 Unspecified abnormalities of gait and mobility: Secondary | ICD-10-CM

## 2014-12-07 LAB — CBC WITH DIFFERENTIAL/PLATELET
BASOS ABS: 0 10*3/uL (ref 0.0–0.1)
Basophils Relative: 0.3 % (ref 0.0–3.0)
EOS ABS: 0.1 10*3/uL (ref 0.0–0.7)
Eosinophils Relative: 1.9 % (ref 0.0–5.0)
HEMATOCRIT: 27.7 % — AB (ref 39.0–52.0)
LYMPHS PCT: 31.7 % (ref 12.0–46.0)
Lymphs Abs: 1.4 10*3/uL (ref 0.7–4.0)
MCHC: 31.9 g/dL (ref 30.0–36.0)
MCV: 85.2 fl (ref 78.0–100.0)
MONO ABS: 0.7 10*3/uL (ref 0.1–1.0)
Monocytes Relative: 16.2 % — ABNORMAL HIGH (ref 3.0–12.0)
Neutro Abs: 2.2 10*3/uL (ref 1.4–7.7)
Neutrophils Relative %: 49.9 % (ref 43.0–77.0)
PLATELETS: 201 10*3/uL (ref 150.0–400.0)
RBC: 3.26 Mil/uL — ABNORMAL LOW (ref 4.22–5.81)
RDW: 16.2 % — ABNORMAL HIGH (ref 11.5–15.5)
WBC: 4.4 10*3/uL (ref 4.0–10.5)

## 2014-12-07 LAB — HEPATIC FUNCTION PANEL
ALBUMIN: 2.9 g/dL — AB (ref 3.5–5.2)
ALK PHOS: 111 U/L (ref 39–117)
ALT: 13 U/L (ref 0–53)
AST: 49 U/L — AB (ref 0–37)
Bilirubin, Direct: 0 mg/dL (ref 0.0–0.3)
TOTAL PROTEIN: 7.2 g/dL (ref 6.0–8.3)
Total Bilirubin: 0.3 mg/dL (ref 0.2–1.2)

## 2014-12-07 LAB — BASIC METABOLIC PANEL
BUN: 47 mg/dL — AB (ref 6–23)
CALCIUM: 9 mg/dL (ref 8.4–10.5)
CO2: 27 mEq/L (ref 19–32)
CREATININE: 2.41 mg/dL — AB (ref 0.40–1.50)
Chloride: 108 mEq/L (ref 96–112)
GFR: 33.91 mL/min — ABNORMAL LOW (ref >60.00)
Glucose, Bld: 155 mg/dL — ABNORMAL HIGH (ref 70–99)
Potassium: 4.2 mEq/L (ref 3.5–5.1)
Sodium: 143 mEq/L (ref 135–145)

## 2014-12-07 MED ORDER — QUETIAPINE FUMARATE 25 MG PO TABS: ORAL_TABLET | ORAL | Status: DC

## 2014-12-07 MED ORDER — FUROSEMIDE 40 MG PO TABS: 40.0000 mg | ORAL_TABLET | Freq: Every morning | ORAL | Status: DC

## 2014-12-07 MED ORDER — DILTIAZEM HCL ER COATED BEADS 300 MG PO CP24: 300.0000 mg | ORAL_CAPSULE | Freq: Every day | ORAL | Status: AC

## 2014-12-07 NOTE — Assessment & Plan Note (Signed)
stable overall by history and exam, recent data reviewed with pt, and pt to continue medical treatment as before,  to f/u any worsening symptoms or concerns le Lab Results  Component Value Date   WBC 7.6 11/23/2014   HGB 10.3* 11/23/2014   HCT 32.4* 11/23/2014   PLT 127* 11/23/2014   GLUCOSE 146* 11/24/2014   CHOL 101 12/01/2013   TRIG * 12/01/2013    477.0 Triglyceride is over 400; calculations on Lipids are invalid.   HDL 15.30* 12/01/2013   LDLDIRECT 32.4 12/01/2013   ALT 16* 11/04/2014   AST 54* 11/04/2014   NA 132* 11/24/2014   K 3.7 11/24/2014   CL 96* 11/24/2014   CREATININE 2.61* 11/24/2014   BUN 54* 11/24/2014   CO2 25 11/24/2014   TSH 1.39 12/01/2013   PSA 6.18* 12/01/2013   INR 1.27 09/09/2014   HGBA1C 6.5 12/01/2013    Note:  Total time for pt hx, exam, review of record with pt in the room, determination of diagnoses and plan for further eval and tx is > 40 min, with over 50% spent in coordination and counseling of patient

## 2014-12-07 NOTE — Progress Notes (Signed)
Pre visit review using our clinic review tool, if applicable. No additional management support is needed unless otherwise documented below in the visit note. 

## 2014-12-07 NOTE — Progress Notes (Signed)
Subjective:    Patient ID: Gerald Gomez, male    DOB: 02-20-1941, 74 y.o.   MRN: [id number]  HPI  Here to f/u with wife, overall doing ok,  Pt denies chest pain, increasing sob or doe, wheezing, orthopnea, PND, increased LE swelling, palpitations, dizziness or syncope.  Pt denies new neurological symptoms such as new headache, or facial or extremity weakness or numbness.  Pt denies polydipsia, polyuria, or low sugar episode.   Pt denies new neurological symptoms such as new headache, or facial or extremity weakness or numbness.   Pt states overall good compliance with meds, not really trying to follow a diet. Has more difficulty with memory loss, ADL and home care, wife ask for home heatlh assist, due to worsening general weakness and several falls. Seems to sleep more during the day on bid seroquel, then up at night.   Pt denies fever, wt loss, night sweats, loss of appetite, or other constitutional symptoms Past Medical History  Diagnosis Date  . ALLERGIC RHINITIS 03/28/2009  . ANXIETY 03/28/2009  . CHOLELITHIASIS 03/28/2009  . DEPRESSION 03/28/2009  . DIVERTICULOSIS, COLON 03/28/2009  . 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 FATIGUE 03/28/2009  . HYPERLIPIDEMIA 03/28/2009  . HYPERTENSION 03/28/2009  . LOW BACK PAIN, CHRONIC 03/28/2009  . PERIPHERAL VASCULAR DISEASE 03/28/2009  . Personal history of alcoholism 03/28/2009  . THROMBOCYTOPENIA 03/28/2009  . Wheezing 02/07/2010  . CHF (congestive heart failure)   . Impaired glucose tolerance 12/01/2013  . Heart murmur   . Exertional dyspnea 11/04/2014  . History of stomach ulcers   . HEPATITIS C 03/28/2009  . DEGENERATIVE JOINT DISEASE 03/28/2009  . [place] DISEASE, LUMBAR 03/28/2009  . Arthritis     "everywhere"  . Chronic kidney disease, stage 3     /notes 11/04/2014  . Chronic anemia     [name] 11/04/2014  . Thrombocytopenia     chronic/notes 11/04/2014  . Malignant neoplasm prostate     "in treatment for it now" (11/04/2014)  . Pneumonia     Sepsis-  suspecting /notes 11/04/2014   Past Surgical History  Procedure Laterality Date  . Shoulder arthroscopy w/ rotator cuff repair Right 2003  . Cataract extraction w/ intraocular lens  implant, bilateral Bilateral   . Prostate biopsy    . Colonoscopy w/ polypectomy  06/29/2014    polpys proved to be non cancerous done at [facility]    reports that he quit smoking about 19 years ago. His smoking use included Cigarettes. He has a 41 pack-year smoking history. He has never used smokeless tobacco. He reports that he drinks alcohol. He reports that he uses illicit drugs (Marijuana). family history includes Breast cancer in his daughter; Lung cancer in his father; Stroke in his brother. Allergies  Allergen Reactions  . Tramadol Nausea Only   Current Outpatient Prescriptions on File Prior to Visit  Medication Sig Dispense Refill  . albuterol (PROVENTIL HFA;VENTOLIN HFA) 108 (90 BASE) MCG/ACT inhaler Inhale 2 puffs into the lungs every 6 (six) hours as needed for wheezing or shortness of breath. Dispense Ventolin HFA please 1 Inhaler 11  . aspirin 81 MG chewable tablet Chew 1 tablet (81 mg total) by mouth daily. 30 tablet 0  . carbidopa-levodopa (SINEMET IR) 25-100 MG per tablet Take 1 tablet by mouth 3 (three) times daily. 90 tablet 5  . citalopram (CELEXA) 10 MG tablet take 1 tablet by mouth once daily 90 tablet 2  . cycloSPORINE (RESTASIS) 0.05 % ophthalmic emulsion Place 1 drop into  both eyes 2 (two) times daily.    . Diclofenac Sodium 2 % SOLN Apply 1 pump twice daily. (Patient not taking: Reported on 12/07/2014) 112 g 3  . fluticasone (FLONASE) 50 MCG/ACT nasal spray Place 2 sprays into both nostrils daily.    . furosemide (LASIX) 20 MG tablet Take 1 tablet (20 mg total) by mouth daily with supper. 30 tablet 0  . gabapentin (NEURONTIN) 100 MG capsule Take 1 capsule (100 mg total) by mouth 3 (three) times daily. (Patient taking differently: Take 200 mg by mouth 3 (three) times daily. ) 90 capsule 0  .  HYDROcodone-acetaminophen (NORCO/VICODIN) 5-325 MG per tablet Take 1 tablet by mouth every 4 (four) hours as needed for moderate pain. 30 tablet 0  . metoprolol tartrate (LOPRESSOR) 25 MG tablet take 1 tablet by mouth twice a day 180 tablet 3  . potassium chloride (K-DUR,KLOR-CON) 10 MEQ tablet Take 10 mEq by mouth daily.    . prochlorperazine (COMPAZINE) 10 MG tablet Take 1 tablet (10 mg total) by mouth every 6 (six) hours as needed for nausea or vomiting. 45 tablet 3  . tamsulosin (FLOMAX) 0.4 MG CAPS capsule take 1 capsule by mouth once daily 90 capsule 3   No current facility-administered medications on file prior to visit.   Review of Systems  Constitutional: Negative for unusual diaphoresis or night sweats HENT: Negative for ringing in ear or discharge Eyes: Negative for double vision or worsening visual disturbance.  Respiratory: Negative for choking and stridor.   Gastrointestinal: Negative for vomiting or other signifcant bowel change Genitourinary: Negative for hematuria or change in urine volume.  Musculoskeletal: Negative for other MSK pain or swelling Skin: Negative for color change and worsening wound.  Neurological: Negative for tremors and numbness other than noted  Psychiatric/Behavioral: Negative for decreased concentration or agitation other than above       Objective:   Physical Exam BP 118/62 mmHg  Pulse 85  Temp(Src) 98.7 F (37.1 C) (Oral)  SpO2 95% VS noted, non toxic, examined in wheelchair Constitutional: Pt appears in no significant distress HENT: Head: NCAT.  Right Ear: External ear normal.  Left Ear: External ear normal.  Eyes: . Pupils are equal, round, and reactive to light. Conjunctivae and EOM are normal Neck: Normal range of motion. Neck supple.  Cardiovascular: Normal rate and regular rhythm.   Pulmonary/Chest: Effort normal and breath sounds without rales or wheezing.  Abd:  Soft, NT, ND, + BS Neurological: Pt is alert. + confused , motor  grossly intact Skin: Skin is warm. No rash, no LE edema Psychiatric: Pt behavior is normal. No agitation.     Assessment & Plan:

## 2014-12-07 NOTE — Patient Instructions (Addendum)
Please continue all other medications as before, and refills have been done if requested.  Please have the pharmacy call with any other refills you may need.  Please continue your efforts at being more active, low cholesterol diet, and weight control.  You are otherwise up to date with prevention measures today.  Please keep your appointments with your specialists as you may have planned - please call Dr [name] as you have planned (renal)  You will be contacted regarding the referral for: Home Health with RN and PT evaluation  OK to change the seroquel to 50 mg at bedtime (sent to the pharmacy)  Please go to the LAB in the Basement (turn left off the elevator) for the tests to be done today  You will be contacted by phone if any changes need to be made immediately.  Otherwise, you will receive a letter about your results with an explanation, but please check with MyChart first.  Please remember to sign up for MyChart if you have not done so, as this will be important to you in the future with finding out test results, communicating by private email, and scheduling acute appointments online when needed.  Please return in 3 months, or sooner if needed

## 2014-12-08 ENCOUNTER — Ambulatory Visit: Payer: Medicare Other

## 2014-12-09 ENCOUNTER — Ambulatory Visit: Payer: Medicare Other

## 2014-12-09 ENCOUNTER — Telehealth: Payer: Self-pay | Admitting: Internal Medicine

## 2014-12-09 MED ORDER — QUETIAPINE FUMARATE 100 MG PO TABS: 100.0000 mg | ORAL_TABLET | Freq: Every day | ORAL | Status: AC

## 2014-12-09 NOTE — Telephone Encounter (Signed)
[name] called to advise that QUEtiapine (SEROQUEL) 25 MG tablet dosage isnt having any effect. Asking for higher dosage

## 2014-12-09 NOTE — Telephone Encounter (Signed)
Please advise, thanks.

## 2014-12-09 NOTE — Telephone Encounter (Signed)
Ok to incresae the seroquel from 50 to 100 - done erx

## 2014-12-09 NOTE — Telephone Encounter (Signed)
Pt wife informed

## 2014-12-10 ENCOUNTER — Ambulatory Visit: Payer: Medicare Other

## 2014-12-10 ENCOUNTER — Telehealth: Payer: Self-pay | Admitting: Radiation Oncology

## 2014-12-10 NOTE — Telephone Encounter (Signed)
Returned message left by patient's wife, [name]. She wanted to provided an update. She explains she took her husband to see the PCP this week. She goes on to say the PCP set up in home PT and changed around some of his routine medication. Also, she reports the patient is scheduled to see Dr. [name] on Tuesday coming and Dr. Carles Collet (neurologist) after that. [name] and I decided I would reach out to her via phone on September 30 (allowing time for him to strengthen with PT). If the patient is stronger we will arrange a follow up appointment with Dr. [name] to resume his radiation therapy.

## 2014-12-12 NOTE — Assessment & Plan Note (Signed)
stable overall by history and exam, recent data reviewed with pt, and pt to continue medical treatment as before,  to f/u any worsening symptoms or concerns Lab Results  Component Value Date   HGBA1C 6.5 12/01/2013    

## 2014-12-12 NOTE — Assessment & Plan Note (Signed)
For [facility] PT,  to f/u any worsening symptoms or concerns

## 2014-12-12 NOTE — Assessment & Plan Note (Signed)
stable overall by history and exam, recent data reviewed with pt, and pt to continue medical treatment as before,  to f/u any worsening symptoms or concerns, for f/u lab Lab Results  Component Value Date   CREATININE 2.41* 12/07/2014

## 2014-12-12 NOTE — Assessment & Plan Note (Signed)
Some worsening, cont current meds, for [facility] RN and aide if appropriate,  to f/u any worsening symptoms or concerns

## 2014-12-12 NOTE — Assessment & Plan Note (Signed)
With increased risk fall - for home PT ,  to f/u any worsening symptoms or concerns

## 2014-12-13 ENCOUNTER — Ambulatory Visit: Payer: Medicare Other

## 2014-12-14 ENCOUNTER — Ambulatory Visit: Payer: Medicare Other

## 2014-12-15 ENCOUNTER — Ambulatory Visit: Payer: Medicare Other

## 2014-12-16 ENCOUNTER — Ambulatory Visit: Payer: Medicare Other

## 2014-12-17 ENCOUNTER — Ambulatory Visit: Payer: Medicare Other

## 2014-12-20 ENCOUNTER — Ambulatory Visit: Payer: Medicare Other

## 2014-12-20 ENCOUNTER — Telehealth: Payer: Self-pay | Admitting: Internal Medicine

## 2014-12-20 NOTE — Telephone Encounter (Signed)
Terri Piedra is calling to advise that she saw patient for PT eval. She wants 1xweek for 1 week and then 2x/week for 6 weeks. She is requesting verbals

## 2014-12-20 NOTE — Telephone Encounter (Signed)
Verbal okay given.  

## 2014-12-20 NOTE — Telephone Encounter (Signed)
Ok for verbal 

## 2014-12-21 ENCOUNTER — Encounter: Payer: Self-pay | Admitting: Neurology

## 2014-12-21 ENCOUNTER — Ambulatory Visit: Payer: Medicare Other

## 2014-12-21 ENCOUNTER — Ambulatory Visit (INDEPENDENT_AMBULATORY_CARE_PROVIDER_SITE_OTHER): Payer: Medicare Other | Admitting: Neurology

## 2014-12-21 VITALS — BP 140/70 | HR 70

## 2014-12-21 DIAGNOSIS — R413 Other amnesia: Secondary | ICD-10-CM

## 2014-12-21 DIAGNOSIS — G20 Parkinson's disease: Secondary | ICD-10-CM | POA: Diagnosis not present

## 2014-12-21 NOTE — Progress Notes (Signed)
Gerald Gomez was seen today in the movement disorders clinic for neurologic consultation at the request of Dr. Letta Pate.  His PCP is [name], MD.  The consultation is for the evaluation of gait instability out of proportion to the degenerative changes in his back.  Pt is accompanied by his wife, who supplements the history.  Pt has had difficulty walking for about 1.5 years.  His wife reports that he began to fall.  Some of the falls were associated with acute CHF.  He was hospitalized in May for falls.  He was had gotten up to go to the kitchen and had fallen on the floor and could not get off the floor.  There was no LOC.  He is now receiving in home PT.  His wife is not sure if he is getting stronger or not.  She is requiring help to get him to stand.    12/29/13 update:  Pt returning for f/u.  Pt is on carbidopa/levodopa 25/100 bid even though he is to take it tid; he states that it made his stomach upset.  He had started it and then stopped it because he was convinced he didn't have PD and then his wife convinced him to restart it and follow up here.  He is out of the wheel chair; he says that he isn't doing better from the carbidopa/levodopa 25/100 but rather from his ibuprofen which took away "that inflammation from my spinal cord."  He is no longer falling and says that is from the ibuprofen.  He admits that his tremor is gone that he previously had.    05/03/14 update:  Pt is returning for f/u, on carbidopa/levodopa 25/100 but only taking bid (AM and noon) instead of tid.  He is overall "not good" but it is unrelated to PD and related to a new diagnosis of prostate CA.  No falls since last visit.  No hallucinations.  Still with chronic pain but much better than in July of last year.  No further tremor  12/21/14 update:  The patient presents today, accompanied by his wife who supplements the history.  I last saw the patient proximally 6 months ago.  I reviewed records available to me since  that time.  He has had multiple hospital admissions.  He presented to Hospital on 09/08/2014.  Apparently, he had some slurred speech the day before and difficulty walking.  The day he presented to the hospital he was okay in the early morning but then fell and hit his head and became nonverbal, although he was following commands.  He had a CT of the brain that was negative.  He was admitted and worked up and it was felt that he had a toxic metabolic encephalopathy secondary to medication that was added (morphine) for pain control.  He was then admitted again from August 11 to 11/07/2014 because of an exacerbation of COPD.  Just a few days later on 11/13/2014 he was admitted for acute renal failure.  He signed himself out [place] on 11/16/2014.  He went back to the hospital on 11/17/2014 and was admitted until 11/24/2014 with acute on chronic congestive heart failure.  He experienced some delirium in the hospital and was placed on Seroquel, 25 mg.  He called his primary care physician, Dr. Jenny Reichmann, on September 15 and asked for an increase in dosage of the Seroquel as he was having insomnia and this was increased from 50 mg to 100 mg.  Pt states that "I really need 200 mg of that; when I took 200 mg of that I slept really well."  Pt reports that he is taking his carbidopa/levodopa 25/100 and is taking it tid (last time was taking bid).  States that PT is coming to the house.    PREVIOUS MEDICATIONS: none to date  ALLERGIES:   Allergies  Allergen Reactions  . Tramadol Nausea Only    CURRENT MEDICATIONS:  Current Outpatient Prescriptions on File Prior to Visit  Medication Sig Dispense Refill  . albuterol (PROVENTIL HFA;VENTOLIN HFA) 108 (90 BASE) MCG/ACT inhaler Inhale 2 puffs into the lungs every 6 (six) hours as needed for wheezing or shortness of breath. Dispense Ventolin HFA please 1 Inhaler 11  . aspirin 81 MG chewable tablet Chew 1 tablet (81 mg total) by mouth daily. 30 tablet 0  .  carbidopa-levodopa (SINEMET IR) 25-100 MG per tablet Take 1 tablet by mouth 3 (three) times daily. 90 tablet 5  . citalopram (CELEXA) 10 MG tablet take 1 tablet by mouth once daily 90 tablet 2  . cycloSPORINE (RESTASIS) 0.05 % ophthalmic emulsion Place 1 drop into both eyes 2 (two) times daily.    Marland Kitchen diltiazem (CARDIZEM CD) 300 MG 24 hr capsule Take 1 capsule (300 mg total) by mouth daily. 30 capsule 5  . fluticasone (FLONASE) 50 MCG/ACT nasal spray Place 2 sprays into both nostrils daily.    . furosemide (LASIX) 20 MG tablet Take 1 tablet (20 mg total) by mouth daily with supper. 30 tablet 0  . furosemide (LASIX) 40 MG tablet Take 1 tablet (40 mg total) by mouth every morning. 30 tablet 5  . gabapentin (NEURONTIN) 100 MG capsule Take 1 capsule (100 mg total) by mouth 3 (three) times daily. (Patient taking differently: Take 200 mg by mouth 3 (three) times daily. ) 90 capsule 0  . metoprolol tartrate (LOPRESSOR) 25 MG tablet take 1 tablet by mouth twice a day 180 tablet 3  . potassium chloride (K-DUR,KLOR-CON) 10 MEQ tablet Take 10 mEq by mouth daily.    . prochlorperazine (COMPAZINE) 10 MG tablet Take 1 tablet (10 mg total) by mouth every 6 (six) hours as needed for nausea or vomiting. 45 tablet 3  . QUEtiapine (SEROQUEL) 100 MG tablet Take 1 tablet (100 mg total) by mouth at bedtime. 90 tablet 3  . tamsulosin (FLOMAX) 0.4 MG CAPS capsule take 1 capsule by mouth once daily 90 capsule 3   No current facility-administered medications on file prior to visit.    PAST MEDICAL HISTORY:   Past Medical History  Diagnosis Date  . ALLERGIC RHINITIS 03/28/2009  . ANXIETY 03/28/2009  . CHOLELITHIASIS 03/28/2009  . DEPRESSION 03/28/2009  . DIVERTICULOSIS, COLON 03/28/2009  . 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 FATIGUE 03/28/2009  . HYPERLIPIDEMIA 03/28/2009  . HYPERTENSION 03/28/2009  . LOW BACK PAIN, CHRONIC 03/28/2009  . PERIPHERAL VASCULAR DISEASE 03/28/2009  . Personal history of alcoholism 03/28/2009  .  THROMBOCYTOPENIA 03/28/2009  . Wheezing 02/07/2010  . CHF (congestive heart failure)   . Impaired glucose tolerance 12/01/2013  . Heart murmur   . Exertional dyspnea 11/04/2014  . History of stomach ulcers   . HEPATITIS C 03/28/2009  . DEGENERATIVE JOINT DISEASE 03/28/2009  . [place] DISEASE, LUMBAR 03/28/2009  . Arthritis     "everywhere"  . Chronic kidney disease, stage 3     /notes 11/04/2014  . Chronic anemia     [name] 11/04/2014  . Thrombocytopenia     chronic/notes  11/04/2014  . Malignant neoplasm prostate     "in treatment for it now" (11/04/2014)  . Pneumonia     Sepsis- suspecting /notes 11/04/2014    PAST SURGICAL HISTORY:   Past Surgical History  Procedure Laterality Date  . Shoulder arthroscopy w/ rotator cuff repair Right 2003  . Cataract extraction w/ intraocular lens  implant, bilateral Bilateral   . Prostate biopsy    . Colonoscopy w/ polypectomy  06/29/2014    polpys proved to be non cancerous done at [facility]:   Social History   Social History  . Marital Status: Married    Spouse Name: N/A  . Number of Children: N/A  . Years of Education: N/A   Occupational History  .      truck driver   Social History Main Topics  . Smoking status: Former Smoker -- 1.00 packs/day for 41 years    Types: Cigarettes    Quit date: 03/27/1995  . Smokeless tobacco: Never Used  . Alcohol Use: Yes     Comment: quit drinking 1997  . Drug Use: Yes    Special: Marijuana     Comment: "quit smoking marijuana before 1997"  . Sexual Activity:    Partners: Female   Other Topics Concern  . 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 Mother Deceased     GI bleed  . Father Deceased     CA, lung  . Brother Alive     CVA  . Daughter Deceased 70    breast CA  . Daughter Alive     healthy     ROS:  A complete 10 system review of systems was obtained and was unremarkable apart from what is mentioned above.  PHYSICAL EXAMINATION:    VITALS:   Filed Vitals:   12/21/14 1248  BP: 140/70  Pulse: 70    GEN:  The patient appears stated age and is in NAD. HEENT:  Normocephalic, atraumatic.  The mucous membranes are moist. The superficial temporal arteries are without ropiness or tenderness. CV:  RRR Lungs:  CTAB Neck/HEME:  There are no carotid bruits bilaterally.  Neurological examination:  Orientation: Pt refused MoCA today and threw the paper back at the administrator Cranial nerves: There is good facial symmetry.  There is facial hypomimia.  Pupils are pinpoint and minimally reactive.  Funduscopic exam is attempted but the disc margins are not well visualized.  There are no square wave jerks.  Extraocular muscles are intact. The visual fields are full to confrontational testing. The speech is fluent and clear. Soft palate rises symmetrically and there is no tongue deviation. Hearing is intact to conversational tone. Sensation: Sensation is intact to light touch throughout. Motor: Strength is at 5/5 in the UE/LE but grip strength is reduced bilaterally..   Movement examination: Tone: There is normal tone bilaterally Abnormal movements: There is no tremor noted today. Coordination:  There is no significant decremation with rapid alternating movements today. Gait and Station: The patient arises out of the chair by pushing off the chair.  He is stooped but looks like from arthritis   ASSESSMENT/PLAN:  1.  Idiopathic Parkinson's disease, newly diagnosed 09/28/13.  -Pt looks markedly better on carbidopa/levodopa 25/100  tid.  He is actually taking it tid now and no rigidity. 2.  Chronic LBP  -This is stable. 3.  Prostate CA  -undergoing radiation treatments 4.  Hx of Toxic metabolic encephalopathy in 08/2014 and delirium in hospital in 10/2014  -This was felt secondary to  medications, especially narcotic pain medications.  I do think we need to try and limit medications that could cause confusion as much as possible.  His Seroquel was recently increased and we will need to watch him closely given this, along with all his other medications.  I talked to him and his wife about the black box warning on this medication and I asked him to talk to his PCP/prescriber about this.   5.  F/u in next 6 months, sooner should new neuro issues arise.  Much greater than 50% of this visit was spent in counseling with the patient and the family.  Total face to face time:  30 min

## 2014-12-22 ENCOUNTER — Ambulatory Visit: Payer: Medicare Other

## 2014-12-23 ENCOUNTER — Ambulatory Visit: Payer: Medicare Other

## 2014-12-24 ENCOUNTER — Ambulatory Visit: Payer: Medicare Other

## 2014-12-27 ENCOUNTER — Ambulatory Visit: Payer: Medicare Other

## 2014-12-28 ENCOUNTER — Telehealth: Payer: Self-pay | Admitting: Radiation Oncology

## 2014-12-28 ENCOUNTER — Ambulatory Visit: Payer: Medicare Other

## 2014-12-28 NOTE — Telephone Encounter (Signed)
Phoned patient's wife to inquire about status. She reports the patient is less confused. Also, she reports the patient is walking and standing better. Confirmed PT continues to go to the home every Monday and Wednesday. This RN will call back on October 11 th to arrange a follow up appointment with Dr. [name].

## 2014-12-29 ENCOUNTER — Other Ambulatory Visit: Payer: Self-pay | Admitting: Internal Medicine

## 2014-12-29 ENCOUNTER — Ambulatory Visit: Payer: Medicare Other

## 2014-12-30 ENCOUNTER — Ambulatory Visit: Payer: Medicare Other

## 2014-12-31 ENCOUNTER — Ambulatory Visit: Payer: Medicare Other

## 2014-12-31 ENCOUNTER — Telehealth: Payer: Self-pay | Admitting: Internal Medicine

## 2015-01-03 ENCOUNTER — Ambulatory Visit: Payer: Medicare Other

## 2015-01-03 NOTE — Telephone Encounter (Signed)
Pt's wife called regarding the refill request for ibuprofen. She states that pt needs to continue to take this. Pt is aware of how this medication can be bad for him and Dr. Jenny Reichmann and pt's kidney doctor are aware that he continually takes this at pt's own risk. She states last time he was taken off of it, he went to the hospital because of the inflammation was so bad in his back. Please advise

## 2015-01-04 ENCOUNTER — Telehealth: Payer: Self-pay | Admitting: Radiation Oncology

## 2015-01-04 ENCOUNTER — Ambulatory Visit: Payer: Medicare Other

## 2015-01-04 NOTE — Telephone Encounter (Signed)
Phoned patient's home to assess status. Spoke with patient's wife, [name]. She confirms the patient is stronger. She wishes to schedule follow up with Dr. [name] to discuss resuming radiation treatment to her husband's prostate. Transferred the call to Earleen Newport to schedule.

## 2015-01-05 ENCOUNTER — Telehealth: Payer: Self-pay | Admitting: *Deleted

## 2015-01-05 ENCOUNTER — Ambulatory Visit: Payer: Medicare Other

## 2015-01-05 MED ORDER — IBUPROFEN 800 MG PO TABS: ORAL_TABLET | ORAL | Status: DC

## 2015-01-05 NOTE — Telephone Encounter (Signed)
Wife left msg on triage stating she has been trying to get refill on husband Ibuprofen. Pharmacist stated that refill was denied. Just saw md & he did not discontinue any medications. Pls have nurse to return call...[name]

## 2015-01-05 NOTE — Telephone Encounter (Signed)
Called pt wife inform her when he was in hospital he told them he wasn't taking so they took med off medication list. Wife states he was confuse & out of it but he can't go with out it. He has been taking took last pill Monday. Inform pt will [facility] to [facility]...Gerald Gomez

## 2015-01-06 ENCOUNTER — Ambulatory Visit: Payer: Medicare Other

## 2015-01-07 ENCOUNTER — Ambulatory Visit: Payer: Medicare Other

## 2015-01-10 ENCOUNTER — Ambulatory Visit: Payer: Medicare Other

## 2015-01-11 ENCOUNTER — Ambulatory Visit: Payer: Medicare Other

## 2015-01-12 ENCOUNTER — Ambulatory Visit: Payer: Medicare Other

## 2015-01-13 ENCOUNTER — Ambulatory Visit: Payer: Medicare Other

## 2015-01-14 ENCOUNTER — Ambulatory Visit: Payer: Medicare Other

## 2015-01-17 ENCOUNTER — Ambulatory Visit: Payer: Medicare Other

## 2015-01-18 ENCOUNTER — Ambulatory Visit: Payer: Medicare Other

## 2015-01-19 ENCOUNTER — Encounter: Payer: Self-pay | Admitting: Radiation Oncology

## 2015-01-19 ENCOUNTER — Ambulatory Visit
Admission: RE | Admit: 2015-01-19 | Discharge: 2015-01-19 | Disposition: A | Discharge status: Home / Self Care | Payer: Medicare Other | Source: Ambulatory Visit | Attending: Radiation Oncology | Admitting: Radiation Oncology

## 2015-01-19 ENCOUNTER — Ambulatory Visit: Payer: Medicare Other

## 2015-01-19 ENCOUNTER — Other Ambulatory Visit: Payer: Self-pay | Admitting: Internal Medicine

## 2015-01-19 VITALS — BP 128/71 | HR 79 | Resp 16 | Wt 203.0 lb

## 2015-01-19 DIAGNOSIS — C61 Malignant neoplasm of prostate: Secondary | ICD-10-CM

## 2015-01-19 NOTE — Progress Notes (Signed)
See progress note under physician encounter. 

## 2015-01-19 NOTE — Progress Notes (Signed)
Radiation Oncology         [phone] ________________________________  Name: Gerald Gomez MRN: [id number]  Date: 01/19/2015  DOB: [birth year]  Reconsult Visit Note  CC: [name], MD  Kathie Rhodes, MD  Diagnosis:   Adenocarcinoma of the prostate.    ICD-9-CM ICD-10-CM   1. Malignant neoplasm of prostate (HCC) 185 C61     Interval Since Last Radiation:  2  months  Narrative:   Patient unable to complete radiation treatment due to hospitalizations, cardiac issues and pain. Patient returning to discuss completing radiation treatment for prostate cancer versus cancelling remaining treatment.  ALLERGIES:  is allergic to tramadol.  Meds: Current Outpatient Prescriptions  Medication Sig Dispense Refill  . albuterol (PROVENTIL HFA;VENTOLIN HFA) 108 (90 BASE) MCG/ACT inhaler Inhale 2 puffs into the lungs every 6 (six) hours as needed for wheezing or shortness of breath. Dispense Ventolin HFA please 1 Inhaler 11  . aspirin 81 MG chewable tablet Chew 1 tablet (81 mg total) by mouth daily. 30 tablet 0  . carbidopa-levodopa (SINEMET IR) 25-100 MG per tablet Take 1 tablet by mouth 3 (three) times daily. 90 tablet 5  . citalopram (CELEXA) 10 MG tablet take 1 tablet by mouth once daily 90 tablet 2  . cycloSPORINE (RESTASIS) 0.05 % ophthalmic emulsion Place 1 drop into both eyes 2 (two) times daily.    Marland Kitchen diltiazem (CARDIZEM CD) 300 MG 24 hr capsule Take 1 capsule (300 mg total) by mouth daily. 30 capsule 5  . fluticasone (FLONASE) 50 MCG/ACT nasal spray Place 2 sprays into both nostrils daily.    . furosemide (LASIX) 20 MG tablet Take 1 tablet (20 mg total) by mouth daily with supper. 30 tablet 0  . furosemide (LASIX) 40 MG tablet Take 1 tablet (40 mg total) by mouth every morning. 30 tablet 5  . gabapentin (NEURONTIN) 100 MG capsule Take 1 capsule (100 mg total) by mouth 3 (three) times daily. (Patient taking differently: Take 200 mg by mouth 3 (three) times daily. ) 90 capsule 0  .  ibuprofen (ADVIL,MOTRIN) 800 MG tablet take 1 tablet by mouth twice a day if needed for pain 60 tablet 2  . metoprolol tartrate (LOPRESSOR) 25 MG tablet take 1 tablet by mouth twice a day 180 tablet 3  . prochlorperazine (COMPAZINE) 10 MG tablet Take 1 tablet (10 mg total) by mouth every 6 (six) hours as needed for nausea or vomiting. 45 tablet 3  . QUEtiapine (SEROQUEL) 100 MG tablet Take 1 tablet (100 mg total) by mouth at bedtime. 90 tablet 3  . tamsulosin (FLOMAX) 0.4 MG CAPS capsule take 1 capsule by mouth once daily 90 capsule 3  . potassium chloride (K-DUR,KLOR-CON) 10 MEQ tablet take 1 tablet by mouth once daily 30 tablet 5   No current facility-administered medications for this encounter.    Physical Findings: The patient is in no acute distress. Patient is alert and oriented.  weight is 203 lb (92.08 kg). His blood pressure is 128/71 and his pulse is 79. His respiration is 16 and oxygen saturation is 100%. .  No significant changes.  Lab Findings: Lab Results  Component Value Date   WBC 4.4 12/07/2014   WBC 5.1 11/23/2008   HGB 8.9 Repeated and verified X2.* 12/07/2014   HGB 14.4 11/23/2008   HCT 27.7* 12/07/2014   HCT 43.1 11/23/2008   PLT 201.0 12/07/2014   PLT 73* 11/23/2008    Lab Results  Component Value Date   NA 143 12/07/2014  K 4.2 12/07/2014   CO2 27 12/07/2014   GLUCOSE 155* 12/07/2014   BUN 47* 12/07/2014   CREATININE 2.41* 12/07/2014   BILITOT 0.3 12/07/2014   ALKPHOS 111 12/07/2014   AST 49* 12/07/2014   ALT 13 12/07/2014   PROT 7.2 12/07/2014   ALBUMIN 2.9* 12/07/2014   CALCIUM 9.0 12/07/2014   ANIONGAP 11 11/24/2014    Radiographic Findings: No results found.  Impression:  The patient is recovering from the effects of radiation. He is also recovering from his hospitalization.  He completed 87.5% of his planned radiation, and has been on break for 10 weeks.  At this point, I would not recommend completing radiotherapy.  Plan: Follow up in a  month. Patient will continue to follow up and take hormone therapy with urology.  _____________________________________  Sheral Apley [name], M.D.   This document serves as a record of services personally performed by Tyler Pita, MD. It was created on his behalf by Lendon Collar, a trained medical scribe. The creation of this record is based on the scribe's personal observations and the provider's statements to them. This document has been checked and approved by the attending provider.

## 2015-01-19 NOTE — Progress Notes (Signed)
GU Location of Tumor / Histology: adenocarcinoma of the prostate  If Prostate Cancer, Gleason Score is (4 + 4) and PSA is (6.18). 38 cc prostate volume.  Gerald Gomez presented 01/31/2012 to his urologist and noted to have very small functional capacity. January 2016 patient reported urinary frequency and nocturia x 4 that had become worse over the past four months.  Biopsies of prostate (if applicable) revealed:    Past/Anticipated interventions by urology, if any: biopsy, bone scan and referral  Past/Anticipated interventions by medical oncology, if any: no  Weight changes, if any: no  Bowel/Bladder complaints, if any: frequency, nocturia x 2, and ED   Nausea/Vomiting, if any: no  Pain issues, if any: no  SAFETY ISSUES:  Prior radiation? yes  Pacemaker/ICD? no  Possible current pregnancy? no  Is the patient on methotrexate? no  Current Complaints / other details: 74 year old male. Married. Bone scan revealed uptake on left L3 verteral body degenerative vs metastatic. Received Lupron 12/14/14. Using Tamsulosin.  Patient unable to complete radiation treatment due to numerous hospitalizations, cardiac issues and pain. Patient returning to discuss completing radiation treatment for prostate ca.

## 2015-01-20 ENCOUNTER — Ambulatory Visit: Payer: Medicare Other

## 2015-02-03 ENCOUNTER — Other Ambulatory Visit: Payer: Self-pay | Admitting: *Deleted

## 2015-02-03 MED ORDER — TAMSULOSIN HCL 0.4 MG PO CAPS: 0.4000 mg | ORAL_CAPSULE | Freq: Every day | ORAL | Status: AC

## 2015-02-03 NOTE — Telephone Encounter (Signed)
Receive call wife states thye have been waiting on refill for pt flomax for a week. [facility] stated thye have not received back. Inform pt per chart no refill has came through for flomax will send electronically...[name]

## 2015-02-11 NOTE — Progress Notes (Signed)
  Radiation Oncology         [phone] ________________________________  Name: Gerald Gomez MRN: [id number]  Date: 11/03/2014  DOB: [birth year]  End of Treatment Note   ICD-9-CM ICD-10-CM    1. Malignant neoplasm of prostate 185 C61     DIAGNOSIS: Adelbert Polcyn is a 74 year old gentleman with stage T1c adenocarcinoma of the prostate with a Gleason's score of 4+4 and a PSA of 6.18.     Indication for treatment:  Curative, Definitive Radiotherapy       Radiation treatment dates:   09/01/2014-11/03/2014  Site/dose:   The prostate was treated to 68.25 Gy in 35 fractions of 1.95 Gy  Beams/energy:   The patient was treated with IMRT using volumetric arc therapy delivering 6 MV X-rays to clockwise and counterclockwise circumferential arcs with a 90 degree collimator offset to avoid dose scalloping.  Image guidance was performed with daily cone beam CT prior to each fraction to align to gold markers in the prostate and assure proper bladder and rectal fill volumes.  Immobilization was achieved with BodyFix custom mold.  Narrative: The patient tolerated radiation treatment relatively well.   The patient experienced some minor urinary irritation and modest fatigue. Patient unable to complete radiation treatment due to hospitalizations, cardiac issues and pain.  Plan: The patient has completed radiation treatment. He will return to radiation oncology clinic for routine followup in one month. I advised him to call or return sooner if he has any questions or concerns related to his recovery or treatment. ________________________________  Sheral Apley. [name], M.D.  This document serves as a record of services personally performed by Tyler Pita, MD. It was created on his behalf by Arlyce Harman, a trained medical scribe. The creation of this record is based on the scribe's personal observations and the provider's statements to them. This document has been checked and approved by the  attending provider.

## 2015-02-21 ENCOUNTER — Other Ambulatory Visit: Payer: Self-pay | Admitting: Internal Medicine

## 2015-02-24 ENCOUNTER — Ambulatory Visit
Admission: RE | Admit: 2015-02-24 | Discharge: 2015-02-24 | Disposition: A | Discharge status: Home / Self Care | Payer: Medicare Other | Source: Ambulatory Visit | Attending: Radiation Oncology | Admitting: Radiation Oncology

## 2015-02-24 ENCOUNTER — Encounter: Payer: Self-pay | Admitting: Radiation Oncology

## 2015-02-24 VITALS — BP 144/66 | HR 62 | Resp 16 | Wt 218.0 lb

## 2015-02-24 DIAGNOSIS — C61 Malignant neoplasm of prostate: Secondary | ICD-10-CM

## 2015-02-24 NOTE — Progress Notes (Addendum)
Weight and vitals stable. Wife reports the patient's appetite is excellent. Patient concerned the teeth on his lower jaw are lose and falling out. Reports chronic back pain continues. Denies pain at this very moment because he has taken his pain medication this morning. Reports nocturia x1.  Denies hematuria. Reports one episode of dysuria three days ago. Describes a steady stream. Denies frequency. Denies urgency or incontinence. Reports fatigue and lack of energy due to high potassium.   BP 144/66 mmHg  Pulse 62  Resp 16  Wt 218 lb (98.884 kg)  SpO2 100% Wt Readings from Last 3 Encounters:  02/24/15 218 lb (98.884 kg)  01/19/15 203 lb (92.08 kg)  11/24/14 197 lb 1.5 oz (89.4 kg)

## 2015-02-24 NOTE — Progress Notes (Signed)
Radiation Oncology         [phone] ________________________________  Name: Gerald Gomez MRN: [id number]  Date: 02/24/2015  DOB: 1940-11-17    Follow-Up Visit Note  CC: [name], MD  Kathie Rhodes, MD  Diagnosis:   Adenocarcinoma of the prostate treated with radiation on 09/01/2014-11/03/2014 to the prostate with 68.25 Gy in 35 fractions of 1.95 Gy.    ICD-9-CM ICD-10-CM   1. Malignant neoplasm of prostate (HCC) 185 C61     Interval Since Last Radiation:  3  months  Narrative:  The patient returns today for routine follow-up.  Wife reports the patient's appetite is excellent. Patient concerned the teeth on his lower jaw are lose and falling out. Reports chronic back pain continues. Denies pain at this very moment because he has taken his pain medication this morning. Reports nocturia x1. Denies hematuria. Reports one episode of dysuria three days ago. Describes a steady stream.   ALLERGIES:  is allergic to tramadol.  Meds: Current Outpatient Prescriptions  Medication Sig Dispense Refill  . albuterol (PROVENTIL HFA;VENTOLIN HFA) 108 (90 BASE) MCG/ACT inhaler Inhale 2 puffs into the lungs every 6 (six) hours as needed for wheezing or shortness of breath. Dispense Ventolin HFA please 1 Inhaler 11  . aspirin 81 MG chewable tablet Chew 1 tablet (81 mg total) by mouth daily. 30 tablet 0  . carbidopa-levodopa (SINEMET IR) 25-100 MG per tablet Take 1 tablet by mouth 3 (three) times daily. 90 tablet 5  . citalopram (CELEXA) 10 MG tablet Take 1 tablet (10 mg total) by mouth daily. 30 tablet 0  . cycloSPORINE (RESTASIS) 0.05 % ophthalmic emulsion Place 1 drop into both eyes 2 (two) times daily.    Marland Kitchen diltiazem (CARDIZEM CD) 300 MG 24 hr capsule Take 1 capsule (300 mg total) by mouth daily. 30 capsule 5  . fluticasone (FLONASE) 50 MCG/ACT nasal spray Place 2 sprays into both nostrils daily.    . furosemide (LASIX) 40 MG tablet Take 1 tablet (40 mg total) by mouth every morning. 30 tablet 5   . gabapentin (NEURONTIN) 100 MG capsule Take 1 capsule (100 mg total) by mouth 3 (three) times daily. (Patient taking differently: Take 200 mg by mouth 3 (three) times daily. ) 90 capsule 0  . ibuprofen (ADVIL,MOTRIN) 800 MG tablet take 1 tablet by mouth twice a day if needed for pain 60 tablet 2  . metoprolol tartrate (LOPRESSOR) 25 MG tablet take 1 tablet by mouth twice a day 180 tablet 3  . QUEtiapine (SEROQUEL) 100 MG tablet Take 1 tablet (100 mg total) by mouth at bedtime. 90 tablet 3  . tamsulosin (FLOMAX) 0.4 MG CAPS capsule Take 1 capsule (0.4 mg total) by mouth daily. 90 capsule 3  . prochlorperazine (COMPAZINE) 10 MG tablet Take 1 tablet (10 mg total) by mouth every 6 (six) hours as needed for nausea or vomiting. (Patient not taking: Reported on 02/24/2015) 45 tablet 3   No current facility-administered medications for this encounter.    Physical Findings: The patient is in no acute distress. Patient is alert and oriented.  weight is 218 lb (98.884 kg). His blood pressure is 144/66 and his pulse is 62. His respiration is 16 and oxygen saturation is 100%. .  No significant changes.  Lab Findings: Lab Results  Component Value Date   WBC 4.4 12/07/2014   WBC 5.1 11/23/2008   HGB 8.9 Repeated and verified X2.* 12/07/2014   HGB 14.4 11/23/2008   HCT 27.7* 12/07/2014  HCT 43.1 11/23/2008   PLT 201.0 12/07/2014   PLT 73* 11/23/2008    Lab Results  Component Value Date   NA 143 12/07/2014   K 4.2 12/07/2014   CO2 27 12/07/2014   GLUCOSE 155* 12/07/2014   BUN 47* 12/07/2014   CREATININE 2.41* 12/07/2014   BILITOT 0.3 12/07/2014   ALKPHOS 111 12/07/2014   AST 49* 12/07/2014   ALT 13 12/07/2014   PROT 7.2 12/07/2014   ALBUMIN 2.9* 12/07/2014   CALCIUM 9.0 12/07/2014   ANIONGAP 11 11/24/2014    Radiographic Findings: No results found.  Impression:  The patient is recovering from the effects of radiation.  Plan: He has a follow up appointment with Dr. [name] in five  months. He will follow up with me on an as needed basis.  _____________________________________  Sheral Apley [name], M.D.    This document serves as a record of services personally performed by Tyler Pita, MD. It was created on his behalf by Lendon Collar, a trained medical scribe. The creation of this record is based on the scribe's personal observations and the provider's statements to them. This document has been checked and approved by the attending provider.

## 2015-03-01 ENCOUNTER — Other Ambulatory Visit (HOSPITAL_COMMUNITY): Payer: Self-pay | Admitting: *Deleted

## 2015-03-02 ENCOUNTER — Encounter (HOSPITAL_COMMUNITY)
Admission: RE | Admit: 2015-03-02 | Discharge: 2015-03-02 | Disposition: A | Discharge status: Home / Self Care | Payer: Medicare Other | Source: Ambulatory Visit | Attending: Nephrology | Admitting: Nephrology

## 2015-03-02 DIAGNOSIS — Z79899 Other long term (current) drug therapy: Secondary | ICD-10-CM | POA: Diagnosis not present

## 2015-03-02 DIAGNOSIS — Z5181 Encounter for therapeutic drug level monitoring: Secondary | ICD-10-CM | POA: Diagnosis not present

## 2015-03-02 DIAGNOSIS — D631 Anemia in chronic kidney disease: Secondary | ICD-10-CM | POA: Insufficient documentation

## 2015-03-02 DIAGNOSIS — N183 Chronic kidney disease, stage 3 (moderate): Secondary | ICD-10-CM | POA: Diagnosis not present

## 2015-03-02 LAB — POCT HEMOGLOBIN-HEMACUE: HEMOGLOBIN: 8.2 g/dL — AB (ref 13.0–17.0)

## 2015-03-02 MED ORDER — EPOETIN ALFA 20000 UNIT/ML IJ SOLN
20000.0000 [IU] | INTRAMUSCULAR | Status: DC
  Administered 2015-03-02: 20000 [IU] via SUBCUTANEOUS

## 2015-03-02 MED ORDER — EPOETIN ALFA 20000 UNIT/ML IJ SOLN
INTRAMUSCULAR | Status: AC
  Filled 2015-03-02: qty 1

## 2015-03-02 NOTE — Discharge Instructions (Signed)
Epoetin Alfa injection What is this medicine? EPOETIN ALFA (e POE e tin AL fa) helps your body make more red blood cells. This medicine is used to treat anemia caused by chronic kidney failure, cancer chemotherapy, or HIV-therapy. It may also be used before surgery if you have anemia. This medicine may be used for other purposes; ask your health care provider or pharmacist if you have questions. What should I tell my health care provider before I take this medicine? They need to know if you have any of these conditions: -blood clotting disorders -cancer patient not on chemotherapy -cystic fibrosis -heart disease, such as angina or heart failure -hemoglobin level of 12 g/dL or greater -high blood pressure -low levels of folate, iron, or vitamin B12 -seizures -an unusual or allergic reaction to erythropoietin, albumin, benzyl alcohol, hamster proteins, other medicines, foods, dyes, or preservatives -pregnant or trying to get pregnant -breast-feeding How should I use this medicine? This medicine is for injection into a vein or under the skin. It is usually given by a health care professional in a hospital or clinic setting. If you get this medicine at home, you will be taught how to prepare and give this medicine. Use exactly as directed. Take your medicine at regular intervals. Do not take your medicine more often than directed. It is important that you put your used needles and syringes in a special sharps container. Do not put them in a trash can. If you do not have a sharps container, call your pharmacist or healthcare provider to get one. Talk to your pediatrician regarding the use of this medicine in children. While this drug may be prescribed for selected conditions, precautions do apply. Overdosage: If you think you have taken too much of this medicine contact a poison control center or emergency room at once. NOTE: This medicine is only for you. Do not share this medicine with  others. What if I miss a dose? If you miss a dose, take it as soon as you can. If it is almost time for your next dose, take only that dose. Do not take double or extra doses. What may interact with this medicine? Do not take this medicine with any of the following medications: -darbepoetin alfa This list may not describe all possible interactions. Give your health care provider a list of all the medicines, herbs, non-prescription drugs, or dietary supplements you use. Also tell them if you smoke, drink alcohol, or use illegal drugs. Some items may interact with your medicine. What should I watch for while using this medicine? Visit your prescriber or health care professional for regular checks on your progress and for the needed blood tests and blood pressure measurements. It is especially important for the doctor to make sure your hemoglobin level is in the desired range, to limit the risk of potential side effects and to give you the best benefit. Keep all appointments for any recommended tests. Check your blood pressure as directed. Ask your doctor what your blood pressure should be and when you should contact him or her. As your body makes more red blood cells, you may need to take iron, folic acid, or vitamin B supplements. Ask your doctor or health care provider which products are right for you. If you have kidney disease continue dietary restrictions, even though this medication can make you feel better. Talk with your doctor or health care professional about the foods you eat and the vitamins that you take. What side effects may I notice   from receiving this medicine? Side effects that you should report to your doctor or health care professional as soon as possible: -allergic reactions like skin rash, itching or hives, swelling of the face, lips, or tongue -breathing problems -changes in vision -chest pain -confusion, trouble speaking or understanding -feeling faint or lightheaded,  falls -high blood pressure -muscle aches or pains -pain, swelling, warmth in the leg -rapid weight gain -severe headaches -sudden numbness or weakness of the face, arm or leg -trouble walking, dizziness, loss of balance or coordination -seizures (convulsions) -swelling of the ankles, feet, hands -unusually weak or tired Side effects that usually do not require medical attention (report to your doctor or health care professional if they continue or are bothersome): -diarrhea -fever, chills (flu-like symptoms) -headaches -nausea, vomiting -redness, stinging, or swelling at site where injected This list may not describe all possible side effects. Call your doctor for medical advice about side effects. You may report side effects to FDA at 1-800-FDA-1088. Where should I keep my medicine? Keep out of the reach of children. Store in a refrigerator between 2 and 8 degrees C (36 and 46 degrees F). Do not freeze or shake. Throw away any unused portion if using a single-dose vial. Multi-dose vials can be kept in the refrigerator for up to 21 days after the initial dose. Throw away unused medicine. NOTE: This sheet is a summary. It may not cover all possible information. If you have questions about this medicine, talk to your doctor, pharmacist, or health care provider.    2016, [employer]. (2008-02-24 10:25:44)  

## 2015-03-11 ENCOUNTER — Encounter (HOSPITAL_COMMUNITY): Payer: Self-pay

## 2015-03-11 ENCOUNTER — Inpatient Hospital Stay (HOSPITAL_COMMUNITY)
Admission: EM | Admit: 2015-03-11 | Discharge: 2015-03-21 | DRG: 682 | Disposition: A | Discharge status: Hospice | Payer: Medicare Other | Attending: Internal Medicine | Admitting: Internal Medicine

## 2015-03-11 ENCOUNTER — Inpatient Hospital Stay (HOSPITAL_COMMUNITY): Payer: Medicare Other

## 2015-03-11 ENCOUNTER — Emergency Department (HOSPITAL_COMMUNITY): Payer: Medicare Other

## 2015-03-11 DIAGNOSIS — D696 Thrombocytopenia, unspecified: Secondary | ICD-10-CM | POA: Diagnosis present

## 2015-03-11 DIAGNOSIS — Z9981 Dependence on supplemental oxygen: Secondary | ICD-10-CM

## 2015-03-11 DIAGNOSIS — F1290 Cannabis use, unspecified, uncomplicated: Secondary | ICD-10-CM | POA: Diagnosis present

## 2015-03-11 DIAGNOSIS — G20 Parkinson's disease: Secondary | ICD-10-CM | POA: Diagnosis present

## 2015-03-11 DIAGNOSIS — J449 Chronic obstructive pulmonary disease, unspecified: Secondary | ICD-10-CM | POA: Diagnosis present

## 2015-03-11 DIAGNOSIS — N184 Chronic kidney disease, stage 4 (severe): Secondary | ICD-10-CM | POA: Diagnosis present

## 2015-03-11 DIAGNOSIS — R0902 Hypoxemia: Secondary | ICD-10-CM | POA: Diagnosis present

## 2015-03-11 DIAGNOSIS — J42 Unspecified chronic bronchitis: Secondary | ICD-10-CM | POA: Diagnosis not present

## 2015-03-11 DIAGNOSIS — Z923 Personal history of irradiation: Secondary | ICD-10-CM | POA: Diagnosis not present

## 2015-03-11 DIAGNOSIS — Z7982 Long term (current) use of aspirin: Secondary | ICD-10-CM

## 2015-03-11 DIAGNOSIS — Z515 Encounter for palliative care: Secondary | ICD-10-CM | POA: Diagnosis not present

## 2015-03-11 DIAGNOSIS — N189 Chronic kidney disease, unspecified: Secondary | ICD-10-CM | POA: Diagnosis present

## 2015-03-11 DIAGNOSIS — E872 Acidosis, unspecified: Secondary | ICD-10-CM | POA: Diagnosis present

## 2015-03-11 DIAGNOSIS — M545 Low back pain, unspecified: Secondary | ICD-10-CM | POA: Diagnosis present

## 2015-03-11 DIAGNOSIS — E875 Hyperkalemia: Secondary | ICD-10-CM | POA: Diagnosis present

## 2015-03-11 DIAGNOSIS — I739 Peripheral vascular disease, unspecified: Secondary | ICD-10-CM | POA: Diagnosis present

## 2015-03-11 DIAGNOSIS — R339 Retention of urine, unspecified: Secondary | ICD-10-CM | POA: Diagnosis present

## 2015-03-11 DIAGNOSIS — Z87891 Personal history of nicotine dependence: Secondary | ICD-10-CM | POA: Diagnosis not present

## 2015-03-11 DIAGNOSIS — G20A1 Parkinson's disease without dyskinesia, without mention of fluctuations: Secondary | ICD-10-CM | POA: Diagnosis present

## 2015-03-11 DIAGNOSIS — E860 Dehydration: Secondary | ICD-10-CM | POA: Diagnosis present

## 2015-03-11 DIAGNOSIS — N179 Acute kidney failure, unspecified: Secondary | ICD-10-CM | POA: Diagnosis not present

## 2015-03-11 DIAGNOSIS — I110 Hypertensive heart disease with heart failure: Secondary | ICD-10-CM | POA: Diagnosis present

## 2015-03-11 DIAGNOSIS — J441 Chronic obstructive pulmonary disease with (acute) exacerbation: Secondary | ICD-10-CM | POA: Diagnosis present

## 2015-03-11 DIAGNOSIS — J810 Acute pulmonary edema: Secondary | ICD-10-CM | POA: Diagnosis not present

## 2015-03-11 DIAGNOSIS — R739 Hyperglycemia, unspecified: Secondary | ICD-10-CM | POA: Diagnosis present

## 2015-03-11 DIAGNOSIS — R269 Unspecified abnormalities of gait and mobility: Secondary | ICD-10-CM

## 2015-03-11 DIAGNOSIS — G8929 Other chronic pain: Secondary | ICD-10-CM | POA: Diagnosis present

## 2015-03-11 DIAGNOSIS — J9621 Acute and chronic respiratory failure with hypoxia: Secondary | ICD-10-CM | POA: Diagnosis present

## 2015-03-11 DIAGNOSIS — N170 Acute kidney failure with tubular necrosis: Principal | ICD-10-CM | POA: Diagnosis present

## 2015-03-11 DIAGNOSIS — E785 Hyperlipidemia, unspecified: Secondary | ICD-10-CM | POA: Diagnosis present

## 2015-03-11 DIAGNOSIS — I5032 Chronic diastolic (congestive) heart failure: Secondary | ICD-10-CM | POA: Diagnosis not present

## 2015-03-11 DIAGNOSIS — D509 Iron deficiency anemia, unspecified: Secondary | ICD-10-CM | POA: Diagnosis present

## 2015-03-11 DIAGNOSIS — F419 Anxiety disorder, unspecified: Secondary | ICD-10-CM | POA: Diagnosis present

## 2015-03-11 DIAGNOSIS — R0602 Shortness of breath: Secondary | ICD-10-CM | POA: Diagnosis present

## 2015-03-11 DIAGNOSIS — I130 Hypertensive heart and chronic kidney disease with heart failure and stage 1 through stage 4 chronic kidney disease, or unspecified chronic kidney disease: Secondary | ICD-10-CM | POA: Diagnosis present

## 2015-03-11 DIAGNOSIS — J189 Pneumonia, unspecified organism: Secondary | ICD-10-CM | POA: Diagnosis not present

## 2015-03-11 DIAGNOSIS — B1920 Unspecified viral hepatitis C without hepatic coma: Secondary | ICD-10-CM | POA: Diagnosis present

## 2015-03-11 DIAGNOSIS — I480 Paroxysmal atrial fibrillation: Secondary | ICD-10-CM | POA: Diagnosis present

## 2015-03-11 DIAGNOSIS — J9601 Acute respiratory failure with hypoxia: Secondary | ICD-10-CM | POA: Diagnosis not present

## 2015-03-11 DIAGNOSIS — Z66 Do not resuscitate: Secondary | ICD-10-CM | POA: Diagnosis present

## 2015-03-11 DIAGNOSIS — J679 Hypersensitivity pneumonitis due to unspecified organic dust: Secondary | ICD-10-CM | POA: Diagnosis present

## 2015-03-11 DIAGNOSIS — Z8546 Personal history of malignant neoplasm of prostate: Secondary | ICD-10-CM | POA: Diagnosis not present

## 2015-03-11 DIAGNOSIS — I5033 Acute on chronic diastolic (congestive) heart failure: Secondary | ICD-10-CM | POA: Diagnosis present

## 2015-03-11 DIAGNOSIS — I248 Other forms of acute ischemic heart disease: Secondary | ICD-10-CM | POA: Diagnosis present

## 2015-03-11 DIAGNOSIS — F329 Major depressive disorder, single episode, unspecified: Secondary | ICD-10-CM | POA: Diagnosis present

## 2015-03-11 DIAGNOSIS — D631 Anemia in chronic kidney disease: Secondary | ICD-10-CM | POA: Diagnosis present

## 2015-03-11 DIAGNOSIS — J9811 Atelectasis: Secondary | ICD-10-CM | POA: Diagnosis present

## 2015-03-11 DIAGNOSIS — R0600 Dyspnea, unspecified: Secondary | ICD-10-CM

## 2015-03-11 DIAGNOSIS — R278 Other lack of coordination: Secondary | ICD-10-CM | POA: Diagnosis present

## 2015-03-11 DIAGNOSIS — C61 Malignant neoplasm of prostate: Secondary | ICD-10-CM | POA: Diagnosis present

## 2015-03-11 DIAGNOSIS — D649 Anemia, unspecified: Secondary | ICD-10-CM | POA: Diagnosis present

## 2015-03-11 LAB — COMPREHENSIVE METABOLIC PANEL
ALBUMIN: 2.7 g/dL — AB (ref 3.5–5.0)
ALK PHOS: 75 U/L (ref 38–126)
ALT: 12 U/L — AB (ref 17–63)
ANION GAP: 11 (ref 5–15)
AST: 42 U/L — AB (ref 15–41)
BUN: 83 mg/dL — ABNORMAL HIGH (ref 6–20)
CALCIUM: 8.9 mg/dL (ref 8.9–10.3)
CO2: 14 mmol/L — AB (ref 22–32)
CREATININE: 3.49 mg/dL — AB (ref 0.61–1.24)
Chloride: 115 mmol/L — ABNORMAL HIGH (ref 101–111)
GFR calc Af Amer: 18 mL/min — ABNORMAL LOW (ref >60)
GFR calc non Af Amer: 16 mL/min — ABNORMAL LOW (ref >60)
GLUCOSE: 291 mg/dL — AB (ref 65–99)
Potassium: 4.4 mmol/L (ref 3.5–5.1)
SODIUM: 140 mmol/L (ref 135–145)
Total Bilirubin: 0.2 mg/dL — ABNORMAL LOW (ref 0.3–1.2)
Total Protein: 7.3 g/dL (ref 6.5–8.1)

## 2015-03-11 LAB — BLOOD GAS, ARTERIAL
Acid-base deficit: 14.6 mmol/L — ABNORMAL HIGH (ref 0.0–2.0)
BICARBONATE: 13 meq/L — AB (ref 20.0–24.0)
DRAWN BY: 44898
Delivery systems: POSITIVE
FIO2: 0.5
INSPIRATORY PAP: 10
Mode: POSITIVE
O2 Saturation: 98 %
PATIENT TEMPERATURE: 98.6
PO2 ART: 127 mmHg — AB (ref 80.0–100.0)
TCO2: 14.3 mmol/L (ref 0–100)
pCO2 arterial: 42.1 mmHg (ref 35.0–45.0)
pH, Arterial: 7.117 — CL (ref 7.350–7.450)

## 2015-03-11 LAB — POC OCCULT BLOOD, ED: Fecal Occult Bld: NEGATIVE

## 2015-03-11 LAB — I-STAT ARTERIAL BLOOD GAS, ED
Acid-base deficit: 15 mmol/L — ABNORMAL HIGH (ref 0.0–2.0)
Acid-base deficit: 15 mmol/L — ABNORMAL HIGH (ref 0.0–2.0)
BICARBONATE: 12.4 meq/L — AB (ref 20.0–24.0)
Bicarbonate: 12.7 mEq/L — ABNORMAL LOW (ref 20.0–24.0)
O2 SAT: 61 %
O2 SAT: 86 %
PCO2 ART: 35 mmHg (ref 35.0–45.0)
PCO2 ART: 33.9 mmHg — AB (ref 35.0–45.0)
PH ART: 7.169 — AB (ref 7.350–7.450)
PO2 ART: 40 mmHg — AB (ref 80.0–100.0)
PO2 ART: 63 mmHg — AB (ref 80.0–100.0)
Patient temperature: 98.6
TCO2: 14 mmol/L (ref 0–100)
TCO2: 13 mmol/L (ref 0–100)
pH, Arterial: 7.172 — CL (ref 7.350–7.450)

## 2015-03-11 LAB — CBC WITH DIFFERENTIAL/PLATELET
BASOS PCT: 0 %
Basophils Absolute: 0 10*3/uL (ref 0.0–0.1)
Eosinophils Absolute: 0 10*3/uL (ref 0.0–0.7)
Eosinophils Relative: 0 %
HEMATOCRIT: 23.3 % — AB (ref 39.0–52.0)
Hemoglobin: 7.2 g/dL — ABNORMAL LOW (ref 13.0–17.0)
Lymphocytes Relative: 4 %
Lymphs Abs: 0.4 10*3/uL — ABNORMAL LOW (ref 0.7–4.0)
MCH: 27.7 pg (ref 26.0–34.0)
MCHC: 30.9 g/dL (ref 30.0–36.0)
MCV: 89.6 fL (ref 78.0–100.0)
MONO ABS: 1.1 10*3/uL — AB (ref 0.1–1.0)
MONOS PCT: 12 %
NEUTROS ABS: 7.9 10*3/uL — AB (ref 1.7–7.7)
Neutrophils Relative %: 84 %
Platelets: 90 10*3/uL — ABNORMAL LOW (ref 150–400)
RBC: 2.6 MIL/uL — ABNORMAL LOW (ref 4.22–5.81)
RDW: 16.3 % — AB (ref 11.5–15.5)
WBC: 9.5 10*3/uL (ref 4.0–10.5)

## 2015-03-11 LAB — I-STAT CG4 LACTIC ACID, ED: Lactic Acid, Venous: 2.48 mmol/L (ref 0.5–2.0)

## 2015-03-11 LAB — URINALYSIS, ROUTINE W REFLEX MICROSCOPIC
Bilirubin Urine: NEGATIVE
GLUCOSE, UA: NEGATIVE mg/dL
Ketones, ur: NEGATIVE mg/dL
LEUKOCYTES UA: NEGATIVE
Nitrite: NEGATIVE
PH: 5 (ref 5.0–8.0)
Protein, ur: 30 mg/dL — AB
SPECIFIC GRAVITY, URINE: 1.016 (ref 1.005–1.030)

## 2015-03-11 LAB — TROPONIN I
TROPONIN I: 0.08 ng/mL — AB (ref <0.031)
Troponin I: 0.05 ng/mL — ABNORMAL HIGH (ref <0.031)

## 2015-03-11 LAB — URINE MICROSCOPIC-ADD ON

## 2015-03-11 LAB — BRAIN NATRIURETIC PEPTIDE: B NATRIURETIC PEPTIDE 5: 1299.4 pg/mL — AB (ref 0.0–100.0)

## 2015-03-11 LAB — GLUCOSE, CAPILLARY
GLUCOSE-CAPILLARY: 142 mg/dL — AB (ref 65–99)
GLUCOSE-CAPILLARY: 141 mg/dL — AB (ref 65–99)

## 2015-03-11 LAB — PREPARE RBC (CROSSMATCH)

## 2015-03-11 LAB — CBG MONITORING, ED: Glucose-Capillary: 92 mg/dL (ref 65–99)

## 2015-03-11 LAB — MRSA PCR SCREENING: MRSA BY PCR: NEGATIVE

## 2015-03-11 MED ORDER — TAMSULOSIN HCL 0.4 MG PO CAPS
0.4000 mg | ORAL_CAPSULE | Freq: Every day | ORAL | Status: DC
  Administered 2015-03-12 – 2015-03-19 (×7): 0.4 mg via ORAL
  Filled 2015-03-11 (×7): qty 1

## 2015-03-11 MED ORDER — ALBUTEROL SULFATE (2.5 MG/3ML) 0.083% IN NEBU: 5.0000 mg | INHALATION_SOLUTION | RESPIRATORY_TRACT | Status: AC | PRN

## 2015-03-11 MED ORDER — ONDANSETRON HCL 4 MG PO TABS: 4.0000 mg | ORAL_TABLET | Freq: Four times a day (QID) | ORAL | Status: DC | PRN

## 2015-03-11 MED ORDER — ACETAMINOPHEN 650 MG RE SUPP: 650.0000 mg | Freq: Four times a day (QID) | RECTAL | Status: DC | PRN

## 2015-03-11 MED ORDER — QUETIAPINE FUMARATE 25 MG PO TABS
100.0000 mg | ORAL_TABLET | Freq: Every day | ORAL | Status: DC
  Administered 2015-03-11 – 2015-03-16 (×6): 100 mg via ORAL
  Filled 2015-03-11 (×6): qty 1

## 2015-03-11 MED ORDER — ALUM & MAG HYDROXIDE-SIMETH 200-200-20 MG/5ML PO SUSP: 30.0000 mL | Freq: Four times a day (QID) | ORAL | Status: DC | PRN

## 2015-03-11 MED ORDER — LEVALBUTEROL HCL 0.63 MG/3ML IN NEBU
0.6300 mg | INHALATION_SOLUTION | Freq: Four times a day (QID) | RESPIRATORY_TRACT | Status: DC
  Administered 2015-03-11: 0.63 mg via RESPIRATORY_TRACT
  Filled 2015-03-11 (×2): qty 3

## 2015-03-11 MED ORDER — LEVALBUTEROL HCL 0.63 MG/3ML IN NEBU
0.6300 mg | INHALATION_SOLUTION | Freq: Three times a day (TID) | RESPIRATORY_TRACT | Status: DC
  Administered 2015-03-12 – 2015-03-18 (×18): 0.63 mg via RESPIRATORY_TRACT
  Filled 2015-03-11 (×19): qty 3

## 2015-03-11 MED ORDER — CITALOPRAM HYDROBROMIDE 20 MG PO TABS
10.0000 mg | ORAL_TABLET | Freq: Every day | ORAL | Status: DC
  Administered 2015-03-12 – 2015-03-19 (×7): 10 mg via ORAL
  Filled 2015-03-11 (×7): qty 1

## 2015-03-11 MED ORDER — INSULIN GLARGINE 100 UNIT/ML ~~LOC~~ SOLN
5.0000 [IU] | Freq: Every day | SUBCUTANEOUS | Status: DC
  Filled 2015-03-11: qty 0.05

## 2015-03-11 MED ORDER — SODIUM CHLORIDE 0.9 % IV SOLN
INTRAVENOUS | Status: DC
  Administered 2015-03-11: 16:00:00 via INTRAVENOUS

## 2015-03-11 MED ORDER — SODIUM CHLORIDE 0.9 % IV SOLN
Freq: Once | INTRAVENOUS | Status: AC
  Administered 2015-03-11: 11:00:00 via INTRAVENOUS

## 2015-03-11 MED ORDER — IPRATROPIUM-ALBUTEROL 0.5-2.5 (3) MG/3ML IN SOLN
3.0000 mL | Freq: Once | RESPIRATORY_TRACT | Status: AC
  Administered 2015-03-11: 3 mL via RESPIRATORY_TRACT
  Filled 2015-03-11: qty 3

## 2015-03-11 MED ORDER — METHYLPREDNISOLONE SODIUM SUCC 40 MG IJ SOLR
40.0000 mg | Freq: Three times a day (TID) | INTRAMUSCULAR | Status: DC
  Administered 2015-03-11 – 2015-03-12 (×3): 40 mg via INTRAVENOUS
  Filled 2015-03-11 (×3): qty 1

## 2015-03-11 MED ORDER — GABAPENTIN 100 MG PO CAPS
100.0000 mg | ORAL_CAPSULE | Freq: Three times a day (TID) | ORAL | Status: DC
  Administered 2015-03-11 – 2015-03-12 (×5): 100 mg via ORAL
  Filled 2015-03-11 (×5): qty 1

## 2015-03-11 MED ORDER — CARBIDOPA-LEVODOPA 25-100 MG PO TABS
1.0000 | ORAL_TABLET | Freq: Three times a day (TID) | ORAL | Status: DC
  Administered 2015-03-11 – 2015-03-21 (×26): 1 via ORAL
  Filled 2015-03-11 (×30): qty 1

## 2015-03-11 MED ORDER — CARBIDOPA-LEVODOPA 25-100 MG PO TABS
1.0000 | ORAL_TABLET | Freq: Three times a day (TID) | ORAL | Status: DC
  Administered 2015-03-11: 1 via ORAL
  Filled 2015-03-11 (×2): qty 1

## 2015-03-11 MED ORDER — ASPIRIN 81 MG PO CHEW
81.0000 mg | CHEWABLE_TABLET | Freq: Every day | ORAL | Status: DC
  Administered 2015-03-12 – 2015-03-19 (×7): 81 mg via ORAL
  Filled 2015-03-11 (×7): qty 1

## 2015-03-11 MED ORDER — ONDANSETRON HCL 4 MG/2ML IJ SOLN: 4.0000 mg | Freq: Four times a day (QID) | INTRAMUSCULAR | Status: DC | PRN

## 2015-03-11 MED ORDER — DEXTROSE 5 % IV SOLN
2.0000 g | INTRAVENOUS | Status: DC
  Administered 2015-03-11: 2 g via INTRAVENOUS
  Filled 2015-03-11: qty 2

## 2015-03-11 MED ORDER — ACETAMINOPHEN 325 MG PO TABS
650.0000 mg | ORAL_TABLET | Freq: Four times a day (QID) | ORAL | Status: DC | PRN
  Administered 2015-03-13 – 2015-03-17 (×3): 650 mg via ORAL
  Filled 2015-03-11 (×4): qty 2

## 2015-03-11 MED ORDER — LEVALBUTEROL HCL 0.63 MG/3ML IN NEBU
0.6300 mg | INHALATION_SOLUTION | Freq: Four times a day (QID) | RESPIRATORY_TRACT | Status: DC | PRN
  Filled 2015-03-11: qty 3

## 2015-03-11 MED ORDER — INSULIN ASPART 100 UNIT/ML ~~LOC~~ SOLN
0.0000 [IU] | Freq: Three times a day (TID) | SUBCUTANEOUS | Status: DC
  Administered 2015-03-11: 1 [IU] via SUBCUTANEOUS
  Administered 2015-03-12: 2 [IU] via SUBCUTANEOUS
  Administered 2015-03-12 (×2): 3 [IU] via SUBCUTANEOUS
  Administered 2015-03-13: 1 [IU] via SUBCUTANEOUS
  Administered 2015-03-13: 2 [IU] via SUBCUTANEOUS
  Administered 2015-03-13: 5 [IU] via SUBCUTANEOUS
  Administered 2015-03-14 (×2): 3 [IU] via SUBCUTANEOUS
  Administered 2015-03-14: 5 [IU] via SUBCUTANEOUS
  Administered 2015-03-15: 2 [IU] via SUBCUTANEOUS
  Administered 2015-03-15: 5 [IU] via SUBCUTANEOUS
  Administered 2015-03-15: 3 [IU] via SUBCUTANEOUS
  Administered 2015-03-16: 5 [IU] via SUBCUTANEOUS
  Administered 2015-03-16: 2 [IU] via SUBCUTANEOUS
  Administered 2015-03-16: 7 [IU] via SUBCUTANEOUS
  Administered 2015-03-17: 3 [IU] via SUBCUTANEOUS
  Administered 2015-03-17: 2 [IU] via SUBCUTANEOUS
  Administered 2015-03-17 – 2015-03-18 (×2): 3 [IU] via SUBCUTANEOUS
  Administered 2015-03-18: 2 [IU] via SUBCUTANEOUS
  Administered 2015-03-18: 3 [IU] via SUBCUTANEOUS
  Administered 2015-03-19: 5 [IU] via SUBCUTANEOUS
  Administered 2015-03-19 (×2): 2 [IU] via SUBCUTANEOUS
  Administered 2015-03-20: 3 [IU] via SUBCUTANEOUS
  Administered 2015-03-20: 2 [IU] via SUBCUTANEOUS
  Administered 2015-03-21: 3 [IU] via SUBCUTANEOUS
  Administered 2015-03-21: 2 [IU] via SUBCUTANEOUS

## 2015-03-11 MED ORDER — CYCLOSPORINE 0.05 % OP EMUL
1.0000 [drp] | Freq: Two times a day (BID) | OPHTHALMIC | Status: DC
  Administered 2015-03-11 – 2015-03-21 (×17): 1 [drp] via OPHTHALMIC
  Filled 2015-03-11 (×21): qty 1

## 2015-03-11 MED ORDER — METOPROLOL TARTRATE 25 MG PO TABS
25.0000 mg | ORAL_TABLET | Freq: Two times a day (BID) | ORAL | Status: DC
  Administered 2015-03-11 – 2015-03-19 (×15): 25 mg via ORAL
  Filled 2015-03-11 (×15): qty 1

## 2015-03-11 MED ORDER — FLUTICASONE PROPIONATE 50 MCG/ACT NA SUSP
2.0000 | Freq: Every day | NASAL | Status: DC
  Administered 2015-03-12 – 2015-03-19 (×5): 2 via NASAL
  Filled 2015-03-11 (×2): qty 16

## 2015-03-11 MED ORDER — SODIUM CHLORIDE 0.9 % IV SOLN
INTRAVENOUS | Status: DC
  Administered 2015-03-11: 09:00:00 via INTRAVENOUS

## 2015-03-11 MED ORDER — GUAIFENESIN ER 600 MG PO TB12
1200.0000 mg | ORAL_TABLET | Freq: Two times a day (BID) | ORAL | Status: DC
  Administered 2015-03-11 – 2015-03-17 (×14): 1200 mg via ORAL
  Filled 2015-03-11 (×15): qty 2

## 2015-03-11 MED ORDER — VANCOMYCIN HCL IN DEXTROSE 1-5 GM/200ML-% IV SOLN: 1000.0000 mg | INTRAVENOUS | Status: DC

## 2015-03-11 MED ORDER — VANCOMYCIN HCL IN DEXTROSE 1-5 GM/200ML-% IV SOLN
1000.0000 mg | Freq: Once | INTRAVENOUS | Status: AC
  Administered 2015-03-11: 1000 mg via INTRAVENOUS
  Filled 2015-03-11: qty 200

## 2015-03-11 MED ORDER — SENNOSIDES-DOCUSATE SODIUM 8.6-50 MG PO TABS
1.0000 | ORAL_TABLET | Freq: Every evening | ORAL | Status: DC | PRN
  Administered 2015-03-14: 1 via ORAL
  Filled 2015-03-11: qty 1

## 2015-03-11 MED ORDER — DEXTROSE 5 % IV SOLN
500.0000 mg | INTRAVENOUS | Status: DC
  Administered 2015-03-11 – 2015-03-12 (×2): 500 mg via INTRAVENOUS
  Filled 2015-03-11 (×2): qty 500

## 2015-03-11 MED ORDER — DILTIAZEM HCL ER COATED BEADS 300 MG PO CP24
300.0000 mg | ORAL_CAPSULE | Freq: Every day | ORAL | Status: DC
  Administered 2015-03-12 – 2015-03-21 (×8): 300 mg via ORAL
  Filled 2015-03-11 (×10): qty 1

## 2015-03-11 MED ORDER — GABAPENTIN 300 MG PO CAPS
600.0000 mg | ORAL_CAPSULE | Freq: Every day | ORAL | Status: DC
  Administered 2015-03-12 – 2015-03-13 (×2): 600 mg via ORAL
  Filled 2015-03-11 (×2): qty 2

## 2015-03-11 MED ORDER — GUAIFENESIN ER 600 MG PO TB12: 600.0000 mg | ORAL_TABLET | Freq: Two times a day (BID) | ORAL | Status: DC

## 2015-03-11 MED ORDER — CEFTRIAXONE SODIUM 1 G IJ SOLR
1.0000 g | INTRAMUSCULAR | Status: DC
  Administered 2015-03-11 – 2015-03-17 (×7): 1 g via INTRAVENOUS
  Filled 2015-03-11 (×9): qty 10

## 2015-03-11 MED ORDER — SODIUM CHLORIDE 0.9 % IV BOLUS (SEPSIS)
500.0000 mL | Freq: Once | INTRAVENOUS | Status: AC
  Administered 2015-03-11: 500 mL via INTRAVENOUS

## 2015-03-11 MED ORDER — SODIUM CHLORIDE 0.9 % IJ SOLN
3.0000 mL | Freq: Two times a day (BID) | INTRAMUSCULAR | Status: DC
  Administered 2015-03-11 – 2015-03-21 (×14): 3 mL via INTRAVENOUS

## 2015-03-11 NOTE — ED Provider Notes (Signed)
CSN: [id number]     Arrival date & time 03/11/15  [id number] History   First MD Initiated Contact with Patient 03/11/15 [phone]     Chief Complaint  Patient presents with  . Shortness of Breath     (Consider location/radiation/quality/duration/timing/severity/associated sxs/prior Treatment) HPI   Pt with hx COPD, CHF, CKDIII, Parkinson's disease, prostate CA s/p radiation p/w worsening SOB and DOE x 2 days.  Associated dry cough.  Has some orthopnea.  Denies other URI symptoms, chest pain, leg swelling.  No change in his medications, not out of medications.  He is not on oxygen at home.  No known sick contacts.     Past Medical History  Diagnosis Date  . ALLERGIC RHINITIS 03/28/2009  . ANXIETY 03/28/2009  . CHOLELITHIASIS 03/28/2009  . DEPRESSION 03/28/2009  . DIVERTICULOSIS, COLON 03/28/2009  . 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 FATIGUE 03/28/2009  . HYPERLIPIDEMIA 03/28/2009  . HYPERTENSION 03/28/2009  . LOW BACK PAIN, CHRONIC 03/28/2009  . PERIPHERAL VASCULAR DISEASE 03/28/2009  . Personal history of alcoholism ([place]) 03/28/2009  . THROMBOCYTOPENIA 03/28/2009  . Wheezing 02/07/2010  . CHF (congestive heart failure) ([place])   . Impaired glucose tolerance 12/01/2013  . Heart murmur   . Exertional dyspnea 11/04/2014  . History of stomach ulcers   . HEPATITIS C 03/28/2009  . DEGENERATIVE JOINT DISEASE 03/28/2009  . Lane DISEASE, LUMBAR 03/28/2009  . Arthritis     "everywhere"  . Chronic kidney disease, stage 3     /notes 11/04/2014  . Chronic anemia     [name] 11/04/2014  . Thrombocytopenia ([place])     chronic/notes 11/04/2014  . Malignant neoplasm prostate ([place])     "in treatment for it now" (11/04/2014)  . Pneumonia     Sepsis- suspecting /notes 11/04/2014   Past Surgical History  Procedure Laterality Date  . Shoulder arthroscopy w/ rotator cuff repair Right 2003  . Cataract extraction w/ intraocular lens  implant, bilateral Bilateral   . Prostate biopsy    . Colonoscopy w/ polypectomy  06/29/2014   polpys proved to be non cancerous done at [facility] History  Problem Relation Age of Onset  . Lung cancer Father   . Stroke Brother   . Breast cancer Daughter    Social History  Substance Use Topics  . Smoking status: Former Smoker -- 1.00 packs/day for 41 years    Types: Cigarettes    Quit date: 03/27/1995  . Smokeless tobacco: Never Used  . Alcohol Use: No     Comment: quit drinking 1997    Review of Systems  All other systems reviewed and are negative.     Allergies  Tramadol  Home Medications   Prior to Admission medications   Medication Sig Start Date End Date Taking? Authorizing Provider  albuterol (PROVENTIL HFA;VENTOLIN HFA) 108 (90 BASE) MCG/ACT inhaler Inhale 2 puffs into the lungs every 6 (six) hours as needed for wheezing or shortness of breath. Dispense Ventolin HFA please 06/01/14   [name], MD  aspirin 81 MG chewable tablet Chew 1 tablet (81 mg total) by mouth daily. 11/24/14   [name], MD  carbidopa-levodopa (SINEMET IR) 25-100 MG per tablet Take 1 tablet by mouth 3 (three) times daily. 07/02/14   [name], DO  citalopram (CELEXA) 10 MG tablet Take 1 tablet (10 mg total) by mouth daily. 02/21/15   [name], MD  cycloSPORINE (RESTASIS) 0.05 % ophthalmic emulsion Place 1 drop into both eyes 2 (two) times  daily.    Historical Provider, MD  diltiazem (CARDIZEM CD) 300 MG 24 hr capsule Take 1 capsule (300 mg total) by mouth daily. 12/07/14   [name], MD  fluticasone (FLONASE) 50 MCG/ACT nasal spray Place 2 sprays into both nostrils daily.    Historical Provider, MD  furosemide (LASIX) 40 MG tablet Take 1 tablet (40 mg total) by mouth every morning. 12/07/14   [name], MD  gabapentin (NEURONTIN) 100 MG capsule Take 1 capsule (100 mg total) by mouth 3 (three) times daily. Patient taking differently: Take 200 mg by mouth 3 (three) times daily.  09/09/14   [name], MD  ibuprofen (ADVIL,MOTRIN) 800 MG tablet take 1 tablet by mouth twice a  day if needed for pain 01/05/15   [name], MD  metoprolol tartrate (LOPRESSOR) 25 MG tablet take 1 tablet by mouth twice a day 07/21/14   [name], MD  prochlorperazine (COMPAZINE) 10 MG tablet Take 1 tablet (10 mg total) by mouth every 6 (six) hours as needed for nausea or vomiting. Patient not taking: Reported on 02/24/2015 11/01/14   Tyler Pita, MD  QUEtiapine (SEROQUEL) 100 MG tablet Take 1 tablet (100 mg total) by mouth at bedtime. 12/09/14   [name], MD  tamsulosin (FLOMAX) 0.4 MG CAPS capsule Take 1 capsule (0.4 mg total) by mouth daily. 02/03/15   [name], MD   BP 146/70 mmHg  Pulse 92  Temp(Src) 100 F (37.8 C) (Rectal)  Ht 6' 0.5" (1.842 m)  Wt 97.268 kg  BMI 28.67 kg/m2  SpO2 97% Physical Exam  Constitutional: He appears well-developed and well-nourished. No distress.  HENT:  Head: Normocephalic and atraumatic.  Neck: Neck supple.  Cardiovascular: Normal rate and regular rhythm.   Pulmonary/Chest: Effort normal. No respiratory distress. He has decreased breath sounds. He has no wheezes. He has rales.  Abdominal: Soft. He exhibits no distension and no mass. There is no tenderness. There is no rebound and no guarding.  Musculoskeletal: He exhibits no edema or tenderness.  Neurological: He is alert. He exhibits normal muscle tone.  Skin: He is not diaphoretic.  Nursing note and vitals reviewed.   ED Course  Procedures (including critical care time) Labs Review Labs Reviewed  COMPREHENSIVE METABOLIC PANEL - Abnormal; Notable for the following:    Chloride 115 (*)    CO2 14 (*)    Glucose, Bld 291 (*)    BUN 83 (*)    Creatinine, Ser 3.49 (*)    Albumin 2.7 (*)    AST 42 (*)    ALT 12 (*)    Total Bilirubin 0.2 (*)    GFR calc non Af Amer 16 (*)    GFR calc Af Amer 18 (*)    All other components within normal limits  CBC WITH DIFFERENTIAL/PLATELET - Abnormal; Notable for the following:    RBC 2.60 (*)    Hemoglobin 7.2 (*)    HCT 23.3 (*)     RDW 16.3 (*)    Platelets 90 (*)    Neutro Abs 7.9 (*)    Lymphs Abs 0.4 (*)    Monocytes Absolute 1.1 (*)    All other components within normal limits  TROPONIN I - Abnormal; Notable for the following:    Troponin I 0.05 (*)    All other components within normal limits  I-STAT CG4 LACTIC ACID, ED - Abnormal; Notable for the following:    Lactic Acid, Venous 2.48 (*)  All other components within normal limits  BRAIN NATRIURETIC PEPTIDE  POC OCCULT BLOOD, ED    Imaging Review Dg Chest 2 View  03/11/2015  CLINICAL DATA:  Shortness of Breath with exertion EXAM: CHEST  2 VIEW COMPARISON:  November 19, 2014 FINDINGS: There is slight atelectasis in the left base. There is no edema or consolidation. Heart is upper normal in size with pulmonary vascularity within normal limits. No adenopathy. No bone lesions. IMPRESSION: Atelectatic change left base, mild.  No edema or consolidation. Electronically Signed   By: Lowella Grip III M.D.   On: 03/11/2015 07:55   I have personally reviewed and evaluated these images and lab results as part of my medical decision-making.   EKG Interpretation   Date/Time:  Friday March 11 2015 07:05:36 EST Ventricular Rate:  88 PR Interval:  217 QRS Duration: 84 QT Interval:  369 QTC Calculation: 446 R Axis:   73 Text Interpretation:  Sinus rhythm Atrial premature complexes Borderline  prolonged PR interval Probable left atrial enlargement Confirmed by  Lita Mains  MD, DAVID ([id number]) on 03/11/2015 8:18:44 AM       9:07 AM Discussed pt with Imogene Burn, PA-C, who accepts patient for admission (attending Dr Daleen Bo).    MDM   Final diagnoses:  CAP (community acquired pneumonia)  Dehydration    Febrile patient with COPD, CHF, CKD p/w SOB x2 days, found to be hypoxic.  Labs significant for worsening renal failure, worsening anemia, lactic acidosis, hyperglycemia.  Troponin is chronically elevated.  CXR demonstrates atelectasis but with fever,  hypoxia, and dehydration, this may be pneumonia.  Pt discussed with and also seen and examined by Dr Lita Mains.  Dr Lita Mains has added UA and blood cultures, recommends treatment with broad spectrum antibiotics given number of comorbidities - Vanc, cefepime or recommended substitute due to shortage.  Admitted to [facility], [place] accepting.      Clayton Bibles, PA-C 03/11/15 [id number]  Julianne Rice, MD 03/11/15 [phone]

## 2015-03-11 NOTE — ED Notes (Signed)
Obtained consent for blood transfusion

## 2015-03-11 NOTE — ED Notes (Signed)
Notified resp to please place pt on CPAP.

## 2015-03-11 NOTE — H&P (Signed)
[facility] History and Physical                                                                                    Gerald Gomez, is a 74 y.o. male  MRN: [id number]   DOB - 1940/09/29  Admit Date - 03/11/2015  Outpatient Primary MD for the patient is [name], MD  Referring Physician:  Clayton Bibles, PA-C  Chief Complaint:   Chief Complaint  Patient presents with  . Shortness of Breath     HPI  Gerald Gomez  is a 74 y.o. male, with idiopathic parkinsonism, mild Perkinson's dementia, diastolic heart failure, stage IV chronic kidney disease, peripheral vascular disease, and thrombocytopenia. He presents to the emergency department with dyspnea on exertion. History is primarily gathered from his wife as the patient is to lethargic and sleepy to provide it. His wife reports that over the last 2 days he has become very short of breath on exertion. He is unable to go to the bathroom without being completely winded. He has been unable to sleep for the past 2 nights despite sitting up in a recliner because he is so short of breath. He is not on oxygen at home. He has gained approximately 5 pounds in the last several weeks.  He is coughing up clear phlegm and had an episode of vomiting clear phlegm last evening. He is currently confused but his wife states that he is intermittently confused at baseline.  In the emergency department the patient's ABG shows a pH of 7.172, PCO2 33, initial PO2 was 40 (subsequent PO2 was 63), hgb is 7.2, lactic acid 2.48, BNP 1299, creatinine elevated at 3.49, glucose 29.  Chest x-ray is negative for infiltrate or overt vascular congestion.   ROS - unable to obtain due to lethargy and confusion.   Past Medical History  Past Medical History  Diagnosis Date  . ALLERGIC RHINITIS 03/28/2009  . ANXIETY 03/28/2009  . CHOLELITHIASIS 03/28/2009  . DEPRESSION 03/28/2009  . DIVERTICULOSIS, COLON 03/28/2009  . 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 FATIGUE 03/28/2009  .  HYPERLIPIDEMIA 03/28/2009  . HYPERTENSION 03/28/2009  . LOW BACK PAIN, CHRONIC 03/28/2009  . PERIPHERAL VASCULAR DISEASE 03/28/2009  . Personal history of alcoholism ([place]) 03/28/2009  . THROMBOCYTOPENIA 03/28/2009  . Wheezing 02/07/2010  . CHF (congestive heart failure) ([place])   . Impaired glucose tolerance 12/01/2013  . Heart murmur   . Exertional dyspnea 11/04/2014  . History of stomach ulcers   . HEPATITIS C 03/28/2009  . DEGENERATIVE JOINT DISEASE 03/28/2009  . [place] DISEASE, LUMBAR 03/28/2009  . Arthritis     "everywhere"  . Chronic kidney disease, stage 3     /notes 11/04/2014  . Chronic anemia     [name] 11/04/2014  . Thrombocytopenia ([place])     chronic/notes 11/04/2014  . Malignant neoplasm prostate ([place])     "in treatment for it now" (11/04/2014)  . Pneumonia     Sepsis- suspecting /notes 11/04/2014    Past Surgical History  Procedure Laterality Date  . Shoulder arthroscopy w/ rotator cuff repair Right 2003  . Cataract extraction w/ intraocular lens  implant, bilateral Bilateral   . Prostate  biopsy    . Colonoscopy w/ polypectomy  06/29/2014    polpys proved to be non cancerous done at [place] History  Substance Use Topics  . Smoking status: Former Smoker -- 1.00 packs/day for 41 years    Types: Cigarettes    Quit date: 03/27/1995  . Smokeless tobacco: Never Used  . Alcohol Use: No     Comment: quit drinking 1997   lives at home with wife. Needs assistance with ADLs.  Family History Family History  Problem Relation Age of Onset  . Lung cancer Father   . Stroke Brother   . Breast cancer Daughter     Prior to Admission medications   Medication Sig Start Date End Date Taking? Authorizing Provider  albuterol (PROVENTIL HFA;VENTOLIN HFA) 108 (90 BASE) MCG/ACT inhaler Inhale 2 puffs into the lungs every 6 (six) hours as needed for wheezing or shortness of breath. Dispense Ventolin HFA please 06/01/14  Yes [name], MD  aspirin 81 MG chewable tablet Chew 1  tablet (81 mg total) by mouth daily. 11/24/14  Yes [name], MD  carbidopa-levodopa (SINEMET IR) 25-100 MG per tablet Take 1 tablet by mouth 3 (three) times daily. 07/02/14  Yes Rebecca S Tat, DO  citalopram (CELEXA) 10 MG tablet Take 1 tablet (10 mg total) by mouth daily. 02/21/15  Yes [name], MD  cycloSPORINE (RESTASIS) 0.05 % ophthalmic emulsion Place 1 drop into both eyes 2 (two) times daily.   Yes Historical Provider, MD  diltiazem (CARDIZEM CD) 300 MG 24 hr capsule Take 1 capsule (300 mg total) by mouth daily. 12/07/14  Yes [name], MD  fluticasone (FLONASE) 50 MCG/ACT nasal spray Place 2 sprays into both nostrils daily.   Yes Historical Provider, MD  furosemide (LASIX) 40 MG tablet Take 1 tablet (40 mg total) by mouth every morning. 12/07/14  Yes [name], MD  gabapentin (NEURONTIN) 100 MG capsule Take 1 capsule (100 mg total) by mouth 3 (three) times daily. Patient taking differently: Take 200 mg by mouth 3 (three) times daily.  09/09/14  Yes [name], MD  ibuprofen (ADVIL,MOTRIN) 800 MG tablet take 1 tablet by mouth twice a day if needed for pain 01/05/15  Yes [name], MD  metoprolol tartrate (LOPRESSOR) 25 MG tablet take 1 tablet by mouth twice a day 07/21/14  Yes [name], MD  QUEtiapine (SEROQUEL) 100 MG tablet Take 1 tablet (100 mg total) by mouth at bedtime. 12/09/14  Yes [name], MD  tamsulosin (FLOMAX) 0.4 MG CAPS capsule Take 1 capsule (0.4 mg total) by mouth daily. 02/03/15  Yes [name], MD    Allergies  Allergen Reactions  . Tramadol Nausea Only    Physical Exam  Vitals  Blood pressure 147/68, pulse 78, temperature 98.8 F (37.1 C), temperature source Rectal, resp. rate 17, height 6' 0.5" (1.842 m), weight 97.268 kg (214 lb 7 oz), SpO2 95 %.   General:  Elderly male, lethargic, lying in bed.  Psych:  Awakens to exam, cooperative, confused  Neuro:   Strength 5/5 all 4 extremities, Sensation intact all 4 extremities.  ENT:  Ears and  Eyes appear Normal, Conjunctivae clear, PER. Moist oral mucosa without erythema or exudates.  Neck:  Supple, No lymphadenopathy appreciated  Respiratory:  Decreased air movement, minimal crackles in the bases on the left, no wheeze.  Cardiac:  Irregular, No Murmurs, distended jugular veins on the right,  no lower extremity edema  Abdomen:  Positive bowel sounds, Soft, Non tender, ++distended, unable to assess for masses  Skin:  No Cyanosis, Normal Skin Turgor, No Skin Rash or Bruise.  Extremities:  Able to move all 4. 5/5 strength in each,  no effusions.  Data Review  Wt Readings from Last 3 Encounters:  03/11/15 97.268 kg (214 lb 7 oz)  02/24/15 98.884 kg (218 lb)  01/19/15 92.08 kg (203 lb)    CBC  Recent Labs Lab 03/11/15 0712  WBC 9.5  HGB 7.2*  HCT 23.3*  PLT 90*  MCV 89.6  MCH 27.7  MCHC 30.9  RDW 16.3*  LYMPHSABS 0.4*  MONOABS 1.1*  EOSABS 0.0  BASOSABS 0.0    Chemistries   Recent Labs Lab 03/11/15 0712  NA 140  K 4.4  CL 115*  CO2 14*  GLUCOSE 291*  BUN 83*  CREATININE 3.49*  CALCIUM 8.9  AST 42*  ALT 12*  ALKPHOS 75  BILITOT 0.2*     Lab Results  Component Value Date   HGBA1C 6.5 12/01/2013    CREATININE: 3.49 mg/dL ABNORMAL (03/11/15 0712) Estimated creatinine clearance - 22.3 mL/min    Cardiac Enzymes  Recent Labs Lab 03/11/15 0712  TROPONINI 0.05*     Urinalysis    Component Value Date/Time   COLORURINE YELLOW 11/17/2014 0600   APPEARANCEUR CLEAR 11/17/2014 0600   LABSPEC 1.012 11/17/2014 0600   PHURINE 5.5 11/17/2014 0600   GLUCOSEU NEGATIVE 11/17/2014 0600   GLUCOSEU NEGATIVE 12/01/2013 1150   HGBUR MODERATE* 11/17/2014 0600   BILIRUBINUR NEGATIVE 11/17/2014 0600   KETONESUR NEGATIVE 11/17/2014 0600   PROTEINUR 30* 11/17/2014 0600   UROBILINOGEN 0.2 11/17/2014 0600   NITRITE NEGATIVE 11/17/2014 0600   LEUKOCYTESUR NEGATIVE 11/17/2014 0600    Imaging results:   Dg Chest 2 View  03/11/2015  CLINICAL DATA:   Shortness of Breath with exertion EXAM: CHEST  2 VIEW COMPARISON:  November 19, 2014 FINDINGS: There is slight atelectasis in the left base. There is no edema or consolidation. Heart is upper normal in size with pulmonary vascularity within normal limits. No adenopathy. No bone lesions. IMPRESSION: Atelectatic change left base, mild.  No edema or consolidation. Electronically Signed   By: Lowella Grip III M.D.   On: 03/11/2015 07:55    My personal review of EKG: Sinus rhythm with no frank ST changes, QTC is not prolonged.   Assessment & Plan  Principal Problem:   Hypoxemia requiring supplemental oxygen Active Problems:   Chronic diastolic heart failure (HCC)   Acute on chronic kidney failure (HCC)   Symptomatic anemia   Hyperglycemia   COPD (chronic obstructive pulmonary disease) (HCC)   Hepatitis C   Thrombocytopenia (HCC)   Hypertensive heart disease with CHF (HCC)   PERIPHERAL VASCULAR DISEASE   LOW BACK PAIN, CHRONIC   Neurologic gait disorder   PD (Parkinson's disease) ([place])   Malignant neoplasm of prostate (HCC)   Metabolic acidosis   CKD (chronic kidney disease) stage 4, GFR 15-29 ml/min (HCC)   PAF (paroxysmal atrial fibrillation) ([place])   CAP (community acquired pneumonia)   Acute on chronic respiratory failure with hypoxemia Confusing picture.  Cause is likely multifactorial: Patient has worsening anemia, likely an element of COPD/community acquired pneumonia, possibly some heart failure as well.  Patient hypoxemic on ABG, but normal pulse ox (94-96% on room air).  We will obtain CT chest. Patient has been placed on CPAP and supplemental oxygen. His ABG does not improve in  a few hours we will check a VQ scan. Will empirically treat for both pneumonia and COPD with azithromycin/Rocephin, Solu-Medrol, Xopenex, Mucinex.   Symptomatic anemia Progressively worsening. This is being cared for by Dr. Vanetta Mulders who has ordered biweekly infusions. As the patient's  hemoglobin has continued to decrease and he is now dyspneic we will order 1 unit of packed red blood cells to be transfused.  Check stool for guiac as well.   Metabolic acidosis with elevated lactic acid and a CO2 of 14. Likely due to acute on chronic kidney failure.   Acute on chronic kidney failure Baseline creatinine is 2.5. Baseline BUN is 47-50. Creatinine today is 3.49 with a BUN of 83. UA is negative for infection. We will hold Lasix and give gentle IV fluids.  Will decrease dose of gabapentin back to 100 mg TID rather than 200 due to decreased renal clearance.   Diastolic heart failure BNP elevated, but does not appear as high as it has been in the past.  CXR negative for acute pulm edema, Last 2D echo showed and LVEF of 65 - 70%.  Has been compliant with lasix and low salt diet.  Due to dehydration, and acidosis will hold lasix and give gentle IVF for a brief period of time.  Strict I&O, Low salt diet, daily weight.   Hyperglycemia (295) No documented history of diabetes.  Anion gap of 11.  Check Hgb A1C, place and low dose lantus and SSI - S.     Idiopathic parkinsons Continue Sinemet IR.  Will ask pharmacy to dose give decreased renal clearance   Paroxysmal Atrial Fibrillation Not currently on anticoagulation.  Platelets are 90,000   Chronic Thrombocytopenia Baseline appears to be in the 120s.  Will monitor closely.  No pharmacologic VTE prophylaxis.  SCDs.      Consultants Called:    None.  Family Communication:     Wife at bedside  Code Status:    full  Condition:    Guarded.  Potential Disposition:   To be determined.  Length of stay likely 3+ days.  Time spent in minutes : [place],  [place] on 03/11/2015 at 10:29 AM Between 7am to 7pm - Pager - [phone] After 7pm go to [email or web address] - password [email or web address] And look for the night coverage person covering me after hours

## 2015-03-11 NOTE — ED Notes (Signed)
Pt from home complaining of worsening sob/dyspnea with exertion over the last 2 days. Pt has hx of chf and takes lasix and has been taking his meds. Auditory wheezes heard.

## 2015-03-11 NOTE — Evaluation (Signed)
Clinical/Bedside Swallow Evaluation Patient Details  Name: Gerald Gomez MRN: [id number] Date of Birth: Jun 15, 1940  Today's Date: 03/11/2015 Time: SLP Start Time (ACUTE ONLY): [id number] SLP Stop Time (ACUTE ONLY): 1557 SLP Time Calculation (min) (ACUTE ONLY): 13 min  Past Medical History:  Past Medical History  Diagnosis Date  . ALLERGIC RHINITIS 03/28/2009  . ANXIETY 03/28/2009  . CHOLELITHIASIS 03/28/2009  . DEPRESSION 03/28/2009  . DIVERTICULOSIS, COLON 03/28/2009  . 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 FATIGUE 03/28/2009  . HYPERLIPIDEMIA 03/28/2009  . HYPERTENSION 03/28/2009  . LOW BACK PAIN, CHRONIC 03/28/2009  . PERIPHERAL VASCULAR DISEASE 03/28/2009  . Personal history of alcoholism ([place]) 03/28/2009  . THROMBOCYTOPENIA 03/28/2009  . Wheezing 02/07/2010  . CHF (congestive heart failure) ([place])   . Impaired glucose tolerance 12/01/2013  . Heart murmur   . Exertional dyspnea 11/04/2014  . History of stomach ulcers   . HEPATITIS C 03/28/2009  . DEGENERATIVE JOINT DISEASE 03/28/2009  . [place] DISEASE, LUMBAR 03/28/2009  . Arthritis     "everywhere"  . Chronic kidney disease, stage 3     /notes 11/04/2014  . Chronic anemia     [name] 11/04/2014  . Thrombocytopenia ([place])     chronic/notes 11/04/2014  . Malignant neoplasm prostate ([place])     "in treatment for it now" (11/04/2014)  . Pneumonia     Sepsis- suspecting /notes 11/04/2014   Past Surgical History:  Past Surgical History  Procedure Laterality Date  . Shoulder arthroscopy w/ rotator cuff repair Right 2003  . Cataract extraction w/ intraocular lens  implant, bilateral Bilateral   . Prostate biopsy    . Colonoscopy w/ polypectomy  06/29/2014    polpys proved to be non cancerous done at [facility]   HPI:  Gerald Gomez is a 74 y.o. male, with idiopathic parkinsonism, mild Perkinson's dementia, diastolic heart failure, stage IV chronic kidney disease, peripheral vascular disease, and thrombocytopenia. He presents to the emergency department with dyspnea  on exertion.    Assessment / Plan / Recommendation Clinical Impression   Pt presents with what appears to be grossly intact oropharyngeal swallowing function.  No focal areas of weakness noted on oral motor exam.  Pt's O2 sats dropped to 89 during large, consecutive boluses of thin liquids with pt becoming slightly dyspneic; however, O2 rebounded to 99 quickly on room air and pt demonstrated no overt s/s of aspiration.  Additionally, pt demonstrated effective mastication of solids with complete clearance of residuals from the oral cavity post swallow.  No previous history of swallowing difficulty per pt.  Recommend that pt remain on his currently prescribed diet.  Given that pt is at baseline for swallowing function, no further ST needs are indicated at this time.      Aspiration Risk  Mild aspiration risk    Diet Recommendation Regular;Thin liquid   Liquid Administration via: Cup;Straw Medication Administration: Whole meds with liquid Supervision: Patient able to self feed;Comment (may need staff assist due to decreased manual dexterity)       Follow up Recommendations  None      Swallow Study   General HPI: Gerald Gomez is a 74 y.o. male, with idiopathic parkinsonism, mild Perkinson's dementia, diastolic heart failure, stage IV chronic kidney disease, peripheral vascular disease, and thrombocytopenia. He presents to the emergency department with dyspnea on exertion.  Type of Study: Bedside Swallow Evaluation Previous Swallow Assessment: none on record Diet Prior to this Study: Regular;Thin liquids Temperature Spikes Noted: No Respiratory Status: Room air (on CPAP upon  arrival, tolerated removal during evaluation ) History of Recent Intubation: No Behavior/Cognition: Alert;Cooperative;Pleasant mood Oral Cavity Assessment: Within Functional Limits Oral Care Completed by SLP: No Oral Cavity - Dentition: Adequate natural dentition Vision: Functional for self-feeding Self-Feeding  Abilities: Needs assist;Able to feed self Patient Positioning: Upright in bed Baseline Vocal Quality: Normal Volitional Cough: Strong Volitional Swallow: Able to elicit    Oral/Motor/Sensory Function Overall Oral Motor/Sensory Function: Within functional limits   Ice Chips     Thin Liquid Thin Liquid: Within functional limits Presentation: Straw    Nectar Thick     Honey Thick     Puree Puree: Within functional limits Presentation: Self Fed;Spoon   Solid Solid: Within functional limits Presentation: Self Fed       Shanena Pellegrino, Selinda Orion 03/11/2015,4:02 PM

## 2015-03-11 NOTE — Progress Notes (Signed)
MEDICATION RELATED CONSULT NOTE - INITIAL   Pharmacy Consult for Sinemet and Gabapentin  Allergies  Allergen Reactions  . Tramadol Nausea Only    Patient Measurements: Height: 6' 0.5" (184.2 cm) Weight: 214 lb 7 oz (97.268 kg) IBW/kg (Calculated) : 78.75  Vital Signs: Temp: 97.6 F (36.4 C) (12/16 1325) Temp Source: Axillary (12/16 1325) BP: 137/73 mmHg (12/16 1330) Pulse Rate: 66 (12/16 1330) Intake/Output from previous day:   Intake/Output from this shift: Total I/O In: 500 [I.V.:500] Out: -   Labs:  Recent Labs  03/11/15 0712  WBC 9.5  HGB 7.2*  HCT 23.3*  PLT 90*  CREATININE 3.49*  ALBUMIN 2.7*  PROT 7.3  AST 42*  ALT 12*  ALKPHOS 75  BILITOT 0.2*   Estimated Creatinine Clearance: 22.3 mL/min (by C-G formula based on Cr of 3.49).   Microbiology: No results found for this or any previous visit (from the past 720 hour(s)).  Medical History: Past Medical History  Diagnosis Date  . ALLERGIC RHINITIS 03/28/2009  . ANXIETY 03/28/2009  . CHOLELITHIASIS 03/28/2009  . DEPRESSION 03/28/2009  . DIVERTICULOSIS, COLON 03/28/2009  . 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 FATIGUE 03/28/2009  . HYPERLIPIDEMIA 03/28/2009  . HYPERTENSION 03/28/2009  . LOW BACK PAIN, CHRONIC 03/28/2009  . PERIPHERAL VASCULAR DISEASE 03/28/2009  . Personal history of alcoholism ([place]) 03/28/2009  . THROMBOCYTOPENIA 03/28/2009  . Wheezing 02/07/2010  . CHF (congestive heart failure) ([place])   . Impaired glucose tolerance 12/01/2013  . Heart murmur   . Exertional dyspnea 11/04/2014  . History of stomach ulcers   . HEPATITIS C 03/28/2009  . DEGENERATIVE JOINT DISEASE 03/28/2009  . [place] DISEASE, LUMBAR 03/28/2009  . Arthritis     "everywhere"  . Chronic kidney disease, stage 3     /notes 11/04/2014  . Chronic anemia     [name] 11/04/2014  . Thrombocytopenia ([place])     chronic/notes 11/04/2014  . Malignant neoplasm prostate ([place])     "in treatment for it now" (11/04/2014)  . Pneumonia     Sepsis-  suspecting /notes 11/04/2014    Medications:  Sinemet 25-100mg  TID and gabapentin 200mg  TID PTA. Pharmacy consulted to provide renal dosage adjustments.   Assessment: 49 YOM with CKD stage IV, admitted 03/11/2015 with worsening SOB/DOE x2 days. Pharmacy consulted to dose abx for HCAP. CrCl ~47mL/min. Dose of Sinemet does not require renal dosage adjustment. Gabapentin dose recommendation for CrCl >15 to 65mL/min = 200-700mg  once daily. Pt previously on 600mg  daily total. Recommend dosing 600mg  once daily for this patient.   Plan:  Gabapentin 600mg  by mouth once daily Sinemet IR 25-100mg  TID  Pharmacy will sign off   Larra Crunkleton C. Lennox Grumbles, PharmD Pharmacy Resident  Pager: [phone] 03/11/2015 1:47 PM  '

## 2015-03-11 NOTE — Progress Notes (Addendum)
ANTIBIOTIC CONSULT NOTE - INITIAL  Pharmacy Consult for Ceftazidime/Vancomycin  Indication: HCAP   Allergies  Allergen Reactions  . Tramadol Nausea Only    Patient Measurements: Height: 6' 0.5" (184.2 cm) Weight: 214 lb 7 oz (97.268 kg) IBW/kg (Calculated) : 78.75  Vital Signs: Temp: 98.8 F (37.1 C) (12/16 0826) Temp Source: Rectal (12/16 0826) BP: 154/82 mmHg (12/16 0800) Pulse Rate: 79 (12/16 0800) Intake/Output from previous day:   Intake/Output from this shift:    Labs:  Recent Labs  03/11/15 0712  WBC 9.5  HGB 7.2*  PLT 90*  CREATININE 3.49*   Estimated Creatinine Clearance: 22.3 mL/min (by C-G formula based on Cr of 3.49). No results for input(s): VANCOTROUGH, VANCOPEAK, VANCORANDOM, GENTTROUGH, GENTPEAK, GENTRANDOM, TOBRATROUGH, TOBRAPEAK, TOBRARND, AMIKACINPEAK, AMIKACINTROU, AMIKACIN in the last 72 hours.   Microbiology: No results found for this or any previous visit (from the past 720 hour(s)).  Medical History: Past Medical History  Diagnosis Date  . ALLERGIC RHINITIS 03/28/2009  . ANXIETY 03/28/2009  . CHOLELITHIASIS 03/28/2009  . DEPRESSION 03/28/2009  . DIVERTICULOSIS, COLON 03/28/2009  . 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 FATIGUE 03/28/2009  . HYPERLIPIDEMIA 03/28/2009  . HYPERTENSION 03/28/2009  . LOW BACK PAIN, CHRONIC 03/28/2009  . PERIPHERAL VASCULAR DISEASE 03/28/2009  . Personal history of alcoholism ([place]) 03/28/2009  . THROMBOCYTOPENIA 03/28/2009  . Wheezing 02/07/2010  . CHF (congestive heart failure) ([place])   . Impaired glucose tolerance 12/01/2013  . Heart murmur   . Exertional dyspnea 11/04/2014  . History of stomach ulcers   . HEPATITIS C 03/28/2009  . DEGENERATIVE JOINT DISEASE 03/28/2009  . [place] DISEASE, LUMBAR 03/28/2009  . Arthritis     "everywhere"  . Chronic kidney disease, stage 3     /notes 11/04/2014  . Chronic anemia     [name] 11/04/2014  . Thrombocytopenia ([place])     chronic/notes 11/04/2014  . Malignant neoplasm prostate ([place])    "in treatment for it now" (11/04/2014)  . Pneumonia     Sepsis- suspecting /notes 11/04/2014    Assessment: 46 YOM w/ CKD stage IV, admitted 03/11/2015 with worsening SOB/DOE x2 days. Pharmacy consulted to dose ceftazidime and vancomycin for HCAP.   WBC 9.5, afebrile. SCr 3.49 (BL ~2.4), CrCl ~65mL/min   12/16 BCx>>sent   12/16>Vanc  12/16>Ceftazidime   Goal of Therapy:  Eradication of infection   Plan:  Ceftazidime 2g IV Q24h  Vancomycin 1g IV @24  hours  F/U c/s, LOT Clinical course, VT @ss  as indicated  F/U renal fxn   Penny Frisbie C. Lennox Grumbles, PharmD Pharmacy Resident  Pager: [phone] 03/11/2015 8:59 AM

## 2015-03-11 NOTE — Progress Notes (Signed)
Placed patient on CPAP per MD's order

## 2015-03-11 NOTE — ED Notes (Addendum)
Pt's oxygen was turned off briefly per RN order. Sats dropped to 94% after 2 minutes. Oxygen was turned back on and set to 2 L/min.

## 2015-03-11 NOTE — Progress Notes (Signed)
Placed patient on CPAP and he is tolerating it well. RT will continue to monitor. 

## 2015-03-12 DIAGNOSIS — D696 Thrombocytopenia, unspecified: Secondary | ICD-10-CM

## 2015-03-12 DIAGNOSIS — J9601 Acute respiratory failure with hypoxia: Secondary | ICD-10-CM

## 2015-03-12 LAB — COMPREHENSIVE METABOLIC PANEL
ALBUMIN: 2.6 g/dL — AB (ref 3.5–5.0)
ALT: 6 U/L — AB (ref 17–63)
ANION GAP: 9 (ref 5–15)
AST: 36 U/L (ref 15–41)
Alkaline Phosphatase: 68 U/L (ref 38–126)
BUN: 86 mg/dL — ABNORMAL HIGH (ref 6–20)
CHLORIDE: 115 mmol/L — AB (ref 101–111)
CO2: 15 mmol/L — AB (ref 22–32)
CREATININE: 3.91 mg/dL — AB (ref 0.61–1.24)
Calcium: 8.9 mg/dL (ref 8.9–10.3)
GFR calc non Af Amer: 14 mL/min — ABNORMAL LOW (ref >60)
GFR, EST AFRICAN AMERICAN: 16 mL/min — AB (ref >60)
GLUCOSE: 152 mg/dL — AB (ref 65–99)
Potassium: 5.2 mmol/L — ABNORMAL HIGH (ref 3.5–5.1)
SODIUM: 139 mmol/L (ref 135–145)
Total Bilirubin: 0.5 mg/dL (ref 0.3–1.2)
Total Protein: 7.4 g/dL (ref 6.5–8.1)

## 2015-03-12 LAB — CBC
HCT: 25.6 % — ABNORMAL LOW (ref 39.0–52.0)
HEMOGLOBIN: 7.9 g/dL — AB (ref 13.0–17.0)
MCH: 27.5 pg (ref 26.0–34.0)
MCHC: 30.9 g/dL (ref 30.0–36.0)
MCV: 89.2 fL (ref 78.0–100.0)
Platelets: 91 10*3/uL — ABNORMAL LOW (ref 150–400)
RBC: 2.87 MIL/uL — AB (ref 4.22–5.81)
RDW: 15.6 % — ABNORMAL HIGH (ref 11.5–15.5)
WBC: 13.1 10*3/uL — ABNORMAL HIGH (ref 4.0–10.5)

## 2015-03-12 LAB — GLUCOSE, CAPILLARY
GLUCOSE-CAPILLARY: 218 mg/dL — AB (ref 65–99)
Glucose-Capillary: 154 mg/dL — ABNORMAL HIGH (ref 65–99)
Glucose-Capillary: 217 mg/dL — ABNORMAL HIGH (ref 65–99)
Glucose-Capillary: 206 mg/dL — ABNORMAL HIGH (ref 65–99)

## 2015-03-12 LAB — TYPE AND SCREEN
ABO/RH(D): O POS
ANTIBODY SCREEN: NEGATIVE
Unit division: 0

## 2015-03-12 LAB — HEMOGLOBIN A1C
Hgb A1c MFr Bld: 6.1 % — ABNORMAL HIGH (ref 4.8–5.6)
Mean Plasma Glucose: 128 mg/dL

## 2015-03-12 LAB — TROPONIN I: TROPONIN I: 0.06 ng/mL — AB (ref <0.031)

## 2015-03-12 MED ORDER — SODIUM BICARBONATE 8.4 % IV SOLN
INTRAVENOUS | Status: DC
  Administered 2015-03-12 – 2015-03-13 (×3): via INTRAVENOUS
  Filled 2015-03-12 (×5): qty 150

## 2015-03-12 MED ORDER — ALBUTEROL SULFATE (2.5 MG/3ML) 0.083% IN NEBU: 5.0000 mg | INHALATION_SOLUTION | RESPIRATORY_TRACT | Status: DC | PRN

## 2015-03-12 MED ORDER — SODIUM POLYSTYRENE SULFONATE 15 GM/60ML PO SUSP
30.0000 g | Freq: Once | ORAL | Status: AC
  Administered 2015-03-12: 30 g via ORAL
  Filled 2015-03-12: qty 120

## 2015-03-12 MED ORDER — METHYLPREDNISOLONE SODIUM SUCC 40 MG IJ SOLR
40.0000 mg | Freq: Two times a day (BID) | INTRAMUSCULAR | Status: DC
  Administered 2015-03-12 – 2015-03-15 (×6): 40 mg via INTRAVENOUS
  Filled 2015-03-12 (×6): qty 1

## 2015-03-12 NOTE — Progress Notes (Signed)
[facility] PROGRESS NOTE  Gerald Gomez [id number] DOB: [birth year] DOA: 03/11/2015  PCP: [name], MD  Brief HPI: 73 year old African-American male with past medical history of idiopathic parkinsonism, mild dementia, diastolic heart failure, stage IV chronic kidney disease, thrombocytopenia, presented with complaints of shortness of breath. He was admitted to the hospital for possible community-acquired pneumonia. Also noted to have acute on chronic renal failure.  Past medical history:  Past Medical History  Diagnosis Date  . ALLERGIC RHINITIS 03/28/2009  . ANXIETY 03/28/2009  . CHOLELITHIASIS 03/28/2009  . DEPRESSION 03/28/2009  . DIVERTICULOSIS, COLON 03/28/2009  . 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 FATIGUE 03/28/2009  . HYPERLIPIDEMIA 03/28/2009  . HYPERTENSION 03/28/2009  . LOW BACK PAIN, CHRONIC 03/28/2009  . PERIPHERAL VASCULAR DISEASE 03/28/2009  . Personal history of alcoholism ([place]) 03/28/2009  . THROMBOCYTOPENIA 03/28/2009  . Wheezing 02/07/2010  . CHF (congestive heart failure) ([place])   . Impaired glucose tolerance 12/01/2013  . Heart murmur   . Exertional dyspnea 11/04/2014  . History of stomach ulcers   . HEPATITIS C 03/28/2009  . DEGENERATIVE JOINT DISEASE 03/28/2009  . [place] DISEASE, LUMBAR 03/28/2009  . Arthritis     "everywhere"  . Chronic kidney disease, stage 3     /notes 11/04/2014  . Chronic anemia     [name] 11/04/2014  . Thrombocytopenia ([place])     chronic/notes 11/04/2014  . Malignant neoplasm prostate ([place])     "in treatment for it now" (11/04/2014)  . Pneumonia     Sepsis- suspecting /notes 11/04/2014    Consultants: None  Procedures: None  Antibiotics: Ceftriaxone and azithromycin 12/16  Subjective: Patient denies any complaints per se. He does have a cough. Denies any chest pain, abdominal pain, nausea or vomiting. Not a very good historian.  Objective: Vital Signs  Filed Vitals:   03/12/15 0427 03/12/15 0500 03/12/15 0733 03/12/15 0736  BP:  132/65  130/73   Pulse: 89  87   Temp: 98.9 F (37.2 C)  98.8 F (37.1 C)   TempSrc: Axillary  Axillary   Resp: 20  19   Height:      Weight:  99.7 kg (219 lb 12.8 oz)    SpO2: 100%  99% 100%    Intake/Output Summary (Last 24 hours) at 03/12/15 1120 Last data filed at 03/12/15 0945  Gross per 24 hour  Intake 1047.5 ml  Output      0 ml  Net 1047.5 ml   Filed Weights   03/11/15 0655 03/12/15 0500  Weight: 97.268 kg (214 lb 7 oz) 99.7 kg (219 lb 12.8 oz)    General appearance: alert, cooperative, appears stated age and no distress Resp: Course breath sounds bilaterally. Few crackles at the bases. No rhonchi. No wheezing. Cardio: S1, S2 normal regular , no murmur, click, rub or gallop GI: soft, non-tender; bowel sounds normal; no masses,  no organomegaly Extremities: extremities normal, atraumatic, no cyanosis or edema Neurologic: Alert. Somewhat distracted. Moving all of his extremities. No facial asymmetry.  Lab Results:  Basic Metabolic Panel:  Recent Labs Lab 03/11/15 0712 03/12/15 0037  NA 140 139  K 4.4 5.2*  CL 115* 115*  CO2 14* 15*  GLUCOSE 291* 152*  BUN 83* 86*  CREATININE 3.49* 3.91*  CALCIUM 8.9 8.9   Liver Function Tests:  Recent Labs Lab 03/11/15 0712 03/12/15 0037  AST 42* 36  ALT 12* 6*  ALKPHOS 75 68  BILITOT 0.2* 0.5  PROT 7.3 7.4  ALBUMIN 2.7* 2.6*  CBC:  Recent Labs Lab 03/11/15 0712 03/12/15 0037  WBC 9.5 13.1*  NEUTROABS 7.9*  --   HGB 7.2* 7.9*  HCT 23.3* 25.6*  MCV 89.6 89.2  PLT 90* 91*   Cardiac Enzymes:  Recent Labs Lab 03/11/15 0712 03/11/15 1845 03/12/15 0037  TROPONINI 0.05* 0.08* 0.06*   BNP (last 3 results)  Recent Labs  11/14/14 1601 11/17/14 0636 03/11/15 0712  BNP 2334.6* 1430.0* 1299.4*   CBG:  Recent Labs Lab 03/11/15 1257 03/11/15 1811 03/11/15 2148 03/12/15 0738  GLUCAP 92 142* 141* 154*    Recent Results (from the past 240 hour(s))  MRSA PCR Screening     Status: None    Collection Time: 03/11/15  5:16 PM  Result Value Ref Range Status   MRSA by PCR NEGATIVE NEGATIVE Final    Comment:        The GeneXpert MRSA Assay (FDA approved for NASAL specimens only), is one component of a comprehensive MRSA colonization surveillance program. It is not intended to diagnose MRSA infection nor to guide or monitor treatment for MRSA infections.       Studies/Results: Dg Chest 2 View  03/11/2015  CLINICAL DATA:  Shortness of Breath with exertion EXAM: CHEST  2 VIEW COMPARISON:  November 19, 2014 FINDINGS: There is slight atelectasis in the left base. There is no edema or consolidation. Heart is upper normal in size with pulmonary vascularity within normal limits. No adenopathy. No bone lesions. IMPRESSION: Atelectatic change left base, mild.  No edema or consolidation. Electronically Signed   By: Lowella Grip III M.D.   On: 03/11/2015 07:55   Ct Chest Wo Contrast  03/11/2015  CLINICAL DATA:  Progressive shortness of breath/dyspnea over the past 2 days. History of congestive heart failure. EXAM: CT CHEST WITHOUT CONTRAST TECHNIQUE: Multidetector CT imaging of the chest was performed following the standard protocol without IV contrast. COMPARISON:  Multiple exams, including chest radiograph of 03/11/2015 FINDINGS: Mediastinum/Nodes: Coronary, aortic arch, and branch vessel atherosclerotic vascular disease. Low-density blood pool.  Mild cardiomegaly.  No thoracic adenopathy. Lungs/Pleura: Patchy bilateral perihilar ground-glass opacities are present. Faint mosaic attenuation. Mild centrilobular emphysema. Cylindrical bronchiectasis in the right upper lobe. No honeycombing or significant peripheral fibrosis. Upper abdomen: Borderline enlarged lymph nodes along the porta hepatis, images 55-57 series 2. Musculoskeletal: Degenerative right sternoclavicular arthropathy with prominent spurring and nitrogen gas phenomenon. Incomplete bridging medially between the left ninth and  tenth ribs shown on image 79 series 5. IMPRESSION: 1. Patchy bilateral ground-glass opacities in the lungs, primarily perihilar distribution. No definite thickening of the secondary pulmonary lobular septa. Faint mosaic attenuation. I favor infiltrative cause such as edema, hypersensitivity pneumonitis, or less likely nonspecific interstitial pneumonitis. 2. Mild cardiomegaly. 3. Low-density blood pool suggests anemia. 4. Mild centrilobular emphysema. 5. Borderline enlarged lymph node in the porta hepatis, relatively similar appearance in 2005 hands probably incidental. Electronically Signed   By: Van Clines M.D.   On: 03/11/2015 11:30    Medications:  Scheduled: . aspirin  81 mg Oral Daily  . azithromycin  500 mg Intravenous Q24H  . carbidopa-levodopa  1 tablet Oral TID  . cefTRIAXone (ROCEPHIN)  IV  1 g Intravenous Q24H  . citalopram  10 mg Oral Daily  . cycloSPORINE  1 drop Both Eyes BID  . diltiazem  300 mg Oral Daily  . fluticasone  2 spray Each Nare Daily  . gabapentin  100 mg Oral TID  . gabapentin  600 mg Oral Daily  . guaiFENesin  1,200 mg Oral BID  . insulin aspart  0-9 Units Subcutaneous TID WC  . levalbuterol  0.63 mg Nebulization TID  . methylPREDNISolone (SOLU-MEDROL) injection  40 mg Intravenous Q12H  . metoprolol tartrate  25 mg Oral BID  . QUEtiapine  100 mg Oral QHS  . sodium chloride  3 mL Intravenous Q12H  . tamsulosin  0.4 mg Oral Daily   Continuous: .  sodium bicarbonate  infusion 1000 mL 75 mL/hr at 03/12/15 0948   [id number] **OR** acetaminophen, alum & mag hydroxide-simeth, levalbuterol, ondansetron **OR** ondansetron (ZOFRAN) IV, senna-docusate  Assessment/Plan:  Principal Problem:   Hypoxemia requiring supplemental oxygen Active Problems:   Hepatitis C   Thrombocytopenia (HCC)   Hypertensive heart disease with CHF (HCC)   PERIPHERAL VASCULAR DISEASE   LOW BACK PAIN, CHRONIC   Chronic diastolic heart failure (HCC)   Neurologic gait  disorder   PD (Parkinson's disease) (HCC)   Malignant neoplasm of prostate (HCC)   COPD (chronic obstructive pulmonary disease) (HCC)   Metabolic acidosis   CKD (chronic kidney disease) stage 4, GFR 15-29 ml/min (HCC)   PAF (paroxysmal atrial fibrillation) (HCC)   CAP (community acquired pneumonia)   Acute on chronic kidney failure (HCC)   Symptomatic anemia   Hyperglycemia    Acute on chronic respiratory failure with hypoxemia Etiology remains unclear. CT chest report reviewed. The findings more suggestive of pneumonitis. Continue coverage for pneumonia for now. Does not appear to have an element of fluid overload at this time. Continue treatment for COPD exacerbation as well. Monitor closely for now.   Acute on chronic kidney failure with metabolic acidosis/hyperkalemia Baseline creatinine is 2.5. Baseline BUN is 47-50. Creatinine was elevated to 3.49 at the time of admission. BUN was 83. This is all more suggestive of volume depletion. Patient was given IV fluids. He hasn't made much urine. Discussed with the nurse. Bladder scan revealed more than 400 mL of urine. Foley catheter will be ordered. Continue with IV fluid with bicarbonate. He did have a renal ultrasound in August which revealed medical renal disease. If creatinine does not improve with these measures or if his urine output doesn't pick up, we will involve nephrology. Will give Kayexalate for elevated potassium.  Anemia likely due to chronic kidney disease  Progressively worsening. This is being cared for by Dr. Vanetta Mulders. Patient was transfused 1 unit of blood. Continue to monitor hemoglobin. No evidence of overt bleeding.   Chronic Diastolic heart failure BNP was elevated at the time of admission but does not appear as high as it has been in the past.CXR negative for acute pulm edema, Last 2D echo showed and LVEF of 65 - 70%.Has been compliant with lasix and low salt diet.Due to dehydration, and acidosis will hold  lasix and give gentle IVF for a brief period of time.Strict I&O. Daily weights. Mildly elevated troponins noted. Significance unclear.  Hyperglycemia No documented history of diabetes.HbA1c is 6.1. Continue sliding scale coverage.  Idiopathic parkinsons with mild dementia Continue Sinemet IR.  Paroxysmal Atrial Fibrillation Currently in sinus rhythm. His episode of atrial fibrillation was back in August. CHADS2VASC his at least 4. However, patient has not been thought to be a candidate for anticoagulation due to compliance issues, thrombocytopenia. Continue Cardizem and metoprolol. Continue aspirin.  Chronic Thrombocytopenia Baseline appears to be in the 120s. Will monitor closely. No pharmacologic VTE prophylaxis. SCDs.   DVT Prophylaxis: SCDs    Code Status: Full code  Family Communication: No family at bedside  Disposition  Plan: Continue management as outlined above. Continue to remain in step down unit.    LOS: 1 day   [facility] Pager [phone] 03/12/2015, 11:20 AM  If 7PM-7AM, please contact night-coverage at [email or web address], password [facility]

## 2015-03-12 NOTE — Progress Notes (Signed)
Pt asked about wearing cpap tonight, he states he "doesnt want anything on his face right now". Education provided, will continue to monitor.

## 2015-03-13 ENCOUNTER — Inpatient Hospital Stay (HOSPITAL_COMMUNITY): Payer: Medicare Other

## 2015-03-13 DIAGNOSIS — D638 Anemia in other chronic diseases classified elsewhere: Secondary | ICD-10-CM

## 2015-03-13 DIAGNOSIS — I480 Paroxysmal atrial fibrillation: Secondary | ICD-10-CM

## 2015-03-13 LAB — BLOOD GAS, ARTERIAL
ACID-BASE DEFICIT: 10.3 mmol/L — AB (ref 0.0–2.0)
Bicarbonate: 16.5 mEq/L — ABNORMAL LOW (ref 20.0–24.0)
Drawn by: 280981
O2 Content: 8 L/min
O2 Saturation: 93.2 %
PCO2 ART: 45.4 mmHg — AB (ref 35.0–45.0)
PH ART: 7.185 — AB (ref 7.350–7.450)
Patient temperature: 98.6
TCO2: 17.9 mmol/L (ref 0–100)
pO2, Arterial: 78.4 mmHg — ABNORMAL LOW (ref 80.0–100.0)

## 2015-03-13 LAB — CBC
HEMATOCRIT: 23.8 % — AB (ref 39.0–52.0)
HEMOGLOBIN: 7.5 g/dL — AB (ref 13.0–17.0)
MCH: 27.6 pg (ref 26.0–34.0)
MCHC: 31.5 g/dL (ref 30.0–36.0)
MCV: 87.5 fL (ref 78.0–100.0)
PLATELETS: 103 10*3/uL — AB (ref 150–400)
RBC: 2.72 MIL/uL — AB (ref 4.22–5.81)
RDW: 15.8 % — ABNORMAL HIGH (ref 11.5–15.5)
WBC: 11.2 10*3/uL — AB (ref 4.0–10.5)

## 2015-03-13 LAB — BASIC METABOLIC PANEL
ANION GAP: 13 (ref 5–15)
BUN: 119 mg/dL — ABNORMAL HIGH (ref 6–20)
CO2: 17 mmol/L — ABNORMAL LOW (ref 22–32)
Calcium: 8.3 mg/dL — ABNORMAL LOW (ref 8.9–10.3)
Chloride: 108 mmol/L (ref 101–111)
Creatinine, Ser: 5.08 mg/dL — ABNORMAL HIGH (ref 0.61–1.24)
GFR calc Af Amer: 12 mL/min — ABNORMAL LOW (ref >60)
GFR, EST NON AFRICAN AMERICAN: 10 mL/min — AB (ref >60)
GLUCOSE: 149 mg/dL — AB (ref 65–99)
POTASSIUM: 4.7 mmol/L (ref 3.5–5.1)
Sodium: 138 mmol/L (ref 135–145)

## 2015-03-13 LAB — GLUCOSE, CAPILLARY
GLUCOSE-CAPILLARY: 264 mg/dL — AB (ref 65–99)
Glucose-Capillary: 146 mg/dL — ABNORMAL HIGH (ref 65–99)
Glucose-Capillary: 159 mg/dL — ABNORMAL HIGH (ref 65–99)
Glucose-Capillary: 251 mg/dL — ABNORMAL HIGH (ref 65–99)

## 2015-03-13 LAB — CK: CK TOTAL: 2147 U/L — AB (ref 49–397)

## 2015-03-13 MED ORDER — FUROSEMIDE 10 MG/ML IJ SOLN
160.0000 mg | INTRAMUSCULAR | Status: AC
  Administered 2015-03-13: 160 mg via INTRAVENOUS
  Filled 2015-03-13: qty 16

## 2015-03-13 MED ORDER — AZITHROMYCIN 500 MG PO TABS
500.0000 mg | ORAL_TABLET | ORAL | Status: DC
  Administered 2015-03-13 – 2015-03-17 (×5): 500 mg via ORAL
  Filled 2015-03-13 (×5): qty 1

## 2015-03-13 MED ORDER — FUROSEMIDE 10 MG/ML IJ SOLN: 160.0000 mg | Freq: Four times a day (QID) | INTRAVENOUS | Status: DC

## 2015-03-13 MED ORDER — FUROSEMIDE 10 MG/ML IJ SOLN
160.0000 mg | Freq: Four times a day (QID) | INTRAVENOUS | Status: DC
  Administered 2015-03-13 – 2015-03-18 (×21): 160 mg via INTRAVENOUS
  Filled 2015-03-13 (×28): qty 16

## 2015-03-13 MED ORDER — GABAPENTIN 300 MG PO CAPS
300.0000 mg | ORAL_CAPSULE | Freq: Every day | ORAL | Status: DC
  Administered 2015-03-14: 300 mg via ORAL
  Filled 2015-03-13: qty 1

## 2015-03-13 NOTE — Consult Note (Addendum)
Renal Service Consult Note [facility]  Gerald Gomez 03/13/2015 [place] Requesting Physician:  Dr Gerald Gomez  Reason for Consult:  Acute on CKD HPI: The patient is a 74 y.o. year-old with hx of HTN, pros Ca, hep C, DJD and CKD stage 4 f/b Dr Gerald Gomez at [facility].  Pt presented 12/16 with SOB/ DOE x 2 days. Had low grade temp in ED was admitted w dx of PNA and started on IVF and antibiotics. Creat up at 3.49 on admission, usual 2.3- 2.6.  Yest creat up to 3.91 and today 5.08.  Today SaO2 dropped and CXR showing new pulm edema , rather significant. Patient w/o complaints however and denies cough , SOB. On bipap now.   + using nsaids at home', denies difficulty voiding , dysuria, hematuria.  No abd pain. No edema, no joint pains or HA.    Past Medical History  Past Medical History  Diagnosis Date  . ALLERGIC RHINITIS 03/28/2009  . ANXIETY 03/28/2009  . CHOLELITHIASIS 03/28/2009  . DEPRESSION 03/28/2009  . DIVERTICULOSIS, COLON 03/28/2009  . 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 FATIGUE 03/28/2009  . HYPERLIPIDEMIA 03/28/2009  . HYPERTENSION 03/28/2009  . LOW BACK PAIN, CHRONIC 03/28/2009  . PERIPHERAL VASCULAR DISEASE 03/28/2009  . Personal history of alcoholism ([place]) 03/28/2009  . THROMBOCYTOPENIA 03/28/2009  . Wheezing 02/07/2010  . CHF (congestive heart failure) ([place])   . Impaired glucose tolerance 12/01/2013  . Heart murmur   . Exertional dyspnea 11/04/2014  . History of stomach ulcers   . HEPATITIS C 03/28/2009  . DEGENERATIVE JOINT DISEASE 03/28/2009  . [place] DISEASE, LUMBAR 03/28/2009  . Arthritis     "everywhere"  . Chronic kidney disease, stage 3     /notes 11/04/2014  . Chronic anemia     [name] 11/04/2014  . Thrombocytopenia ([place])     chronic/notes 11/04/2014  . Malignant neoplasm prostate ([place])     "in treatment for it now" (11/04/2014)  . Pneumonia     Sepsis- suspecting /notes 11/04/2014   Past Surgical History  Past Surgical History  Procedure Laterality Date   . Shoulder arthroscopy w/ rotator cuff repair Right 2003  . Cataract extraction w/ intraocular lens  implant, bilateral Bilateral   . Prostate biopsy    . Colonoscopy w/ polypectomy  06/29/2014    polpys proved to be non cancerous done at [facility] ([place]) History  Family History  Problem Relation Age of Onset  . Lung cancer Father   . Stroke Brother   . Breast cancer Daughter    Social History  reports that he quit smoking about 19 years ago. His smoking use included Cigarettes. He has a 41 pack-year smoking history. He has never used smokeless tobacco. He reports that he uses illicit drugs (Marijuana). He reports that he does not drink alcohol. Allergies  Allergies  Allergen Reactions  . Tramadol Nausea Only   Home medications Prior to Admission medications   Medication Sig Start Date End Date Taking? Authorizing Provider  albuterol (PROVENTIL HFA;VENTOLIN HFA) 108 (90 BASE) MCG/ACT inhaler Inhale 2 puffs into the lungs every 6 (six) hours as needed for wheezing or shortness of breath. Dispense Ventolin HFA please 06/01/14  Yes Gerald Borg, MD  aspirin 81 MG chewable tablet Chew 1 tablet (81 mg total) by mouth daily. 11/24/14  Yes Gerald Eva, MD  carbidopa-levodopa (SINEMET IR) 25-100 MG per tablet Take 1 tablet by mouth 3 (three) times daily. 07/02/14  Yes Gerald Quail Tat, DO  citalopram (CELEXA) 10 MG tablet Take 1 tablet (10 mg total) by mouth daily. 02/21/15  Yes Gerald Borg, MD  cycloSPORINE (RESTASIS) 0.05 % ophthalmic emulsion Place 1 drop into both eyes 2 (two) times daily.   Yes Historical Provider, MD  diltiazem (CARDIZEM CD) 300 MG 24 hr capsule Take 1 capsule (300 mg total) by mouth daily. 12/07/14  Yes Gerald Borg, MD  fluticasone (FLONASE) 50 MCG/ACT nasal spray Place 2 sprays into both nostrils daily.   Yes Historical Provider, MD  furosemide (LASIX) 40 MG tablet Take 1 tablet (40 mg total) by mouth every morning. 12/07/14  Yes Gerald Borg, MD  gabapentin (NEURONTIN) 100 MG  capsule Take 1 capsule (100 mg total) by mouth 3 (three) times daily. Patient taking differently: Take 200 mg by mouth 3 (three) times daily.  09/09/14  Yes Gerald Krystal Eaton, MD  ibuprofen (ADVIL,MOTRIN) 800 MG tablet take 1 tablet by mouth twice a day if needed for pain 01/05/15  Yes Gerald Borg, MD  metoprolol tartrate (LOPRESSOR) 25 MG tablet take 1 tablet by mouth twice a day 07/21/14  Yes Gerald Borg, MD  QUEtiapine (SEROQUEL) 100 MG tablet Take 1 tablet (100 mg total) by mouth at bedtime. 12/09/14  Yes Gerald Borg, MD  tamsulosin (FLOMAX) 0.4 MG CAPS capsule Take 1 capsule (0.4 mg total) by mouth daily. 02/03/15  Yes Gerald Borg, MD   Liver Function Tests  Recent Labs Lab 03/11/15 [phone] 03/12/15 0037  AST 42* 36  ALT 12* 6*  ALKPHOS 75 68  BILITOT 0.2* 0.5  PROT 7.3 7.4  ALBUMIN 2.7* 2.6*   No results for input(s): LIPASE, AMYLASE in the last 168 hours. CBC  Recent Labs Lab 03/11/15 0712 03/12/15 0037 03/13/15 0321  WBC 9.5 13.1* 11.2*  NEUTROABS 7.9*  --   --   HGB 7.2* 7.9* 7.5*  HCT 23.3* 25.6* 23.8*  MCV 89.6 89.2 87.5  PLT 90* 91* 960*   Basic Metabolic Panel  Recent Labs Lab 03/11/15 0712 03/12/15 0037 03/13/15 0321  NA 140 139 138  K 4.4 5.2* 4.7  CL 115* 115* 108  CO2 14* 15* 17*  GLUCOSE 291* 152* 149*  BUN 83* 86* 119*  CREATININE 3.49* 3.91* 5.08*  CALCIUM 8.9 8.9 8.3*    Filed Vitals:   03/13/15 1330 03/13/15 1340 03/13/15 1413 03/13/15 1458  BP:      Pulse: 80 78  69  Temp:      TempSrc:      Resp: _0 Height:      Weight:      SpO2: 97% 100% 89% 97%   Exam On bipap now, no distress, calm No rash, cyanosis or gangrene +jvd Chest faint basilar rales bilat RRR no MRG ABd protuberant tympanitic +bs no ascites GU normal male MS no joint effusion Ext trace LE edema bilat Neuro alert, ox 3 nonfocal  UA 30 prot 0-5 wbc/ rbc, gran cast CXR 12/16 no acute CXR 12/18 new bilat infiltrates c/w edema Renal US 11 cm kidneys no  hydro normal cort thickness  Assessment: 1 Acute on CKD4 - UA neg, poss d/t decomp CHF, nsaid's/ ATN 2 Pulm edema/ hypoxemia - on bipap now, due to vol overload 3 Afib on dilt/ MTP 4 Parkinson's disease -  Has mild/mod dementia per his wife 5 Anemia Hb 7.5 6 Met acidosis 7 HTN dilt / MTP po. BP's normal to low norm 8 Prostate cancer - completed  45/50 XRT treatments a few mos ago for this   Plan - IV lasix, bipap, bladder scan/ foley catheter.  Medical Rx. Spoke with his wife and she doesn't think that he should do dialysis given his age and other medical issues including dementia. Plan is for supportive care, no dialysis.    Kelly Splinter MD [employer] pager [phone]    cell [phone] 03/13/2015, 3:19 PM

## 2015-03-13 NOTE — Progress Notes (Addendum)
[facility] PROGRESS NOTE  DORUK CHADWICK [id number] DOB: [birth year] DOA: 03/11/2015  PCP: [name], MD  Brief HPI: 74 year old African-American male with past medical history of idiopathic parkinsonism, mild dementia, diastolic heart failure, stage IV chronic kidney disease, thrombocytopenia, presented with complaints of shortness of breath. He was admitted to the hospital for possible community-acquired pneumonia. Also noted to have acute on chronic renal failure.  Past medical history:  Past Medical History  Diagnosis Date  . ALLERGIC RHINITIS 03/28/2009  . ANXIETY 03/28/2009  . CHOLELITHIASIS 03/28/2009  . DEPRESSION 03/28/2009  . DIVERTICULOSIS, COLON 03/28/2009  . 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 FATIGUE 03/28/2009  . HYPERLIPIDEMIA 03/28/2009  . HYPERTENSION 03/28/2009  . LOW BACK PAIN, CHRONIC 03/28/2009  . PERIPHERAL VASCULAR DISEASE 03/28/2009  . Personal history of alcoholism ([place]) 03/28/2009  . THROMBOCYTOPENIA 03/28/2009  . Wheezing 02/07/2010  . CHF (congestive heart failure) ([place])   . Impaired glucose tolerance 12/01/2013  . Heart murmur   . Exertional dyspnea 11/04/2014  . History of stomach ulcers   . HEPATITIS C 03/28/2009  . DEGENERATIVE JOINT DISEASE 03/28/2009  . [place] DISEASE, LUMBAR 03/28/2009  . Arthritis     "everywhere"  . Chronic kidney disease, stage 3     /notes 11/04/2014  . Chronic anemia     [name] 11/04/2014  . Thrombocytopenia ([place])     chronic/notes 11/04/2014  . Malignant neoplasm prostate ([place])     "in treatment for it now" (11/04/2014)  . Pneumonia     Sepsis- suspecting /notes 11/04/2014    Consultants: None  Procedures: None  Antibiotics: Ceftriaxone and azithromycin 12/16  Subjective: Patient feels well. Breathing is improved. Denies any chest pains. He refused to have a Foley catheter placed.   Objective: Vital Signs  Filed Vitals:   03/13/15 0800 03/13/15 0929 03/13/15 0930 03/13/15 1044  BP: 145/75 145/75 145/75 118/90    Pulse: 74  84 83  Temp: 97.5 F (36.4 C)     TempSrc: Oral     Resp: 14   18  Height:      Weight:      SpO2: 91%   96%    Intake/Output Summary (Last 24 hours) at 03/13/15 1202 Last data filed at 03/13/15 1100  Gross per 24 hour  Intake   2770 ml  Output    675 ml  Net   2095 ml   Filed Weights   03/11/15 0655 03/12/15 0500 03/13/15 0418  Weight: 97.268 kg (214 lb 7 oz) 99.7 kg (219 lb 12.8 oz) 100.4 kg (221 lb 5.5 oz)    General appearance: alert, cooperative, appears stated age and no distress Resp: Improved air entry bilaterally. Few crackles at the bases. No rhonchi. No wheezing. Cardio: S1, S2 normal regular , no murmur, click, rub or gallop GI: soft, non-tender; bowel sounds normal; no masses,  no organomegaly Extremities: extremities normal, atraumatic, no cyanosis or edema Neurologic: Alert. Somewhat distracted. Moving all of his extremities. No facial asymmetry.  Lab Results:  Basic Metabolic Panel:  Recent Labs Lab 03/11/15 0712 03/12/15 0037 03/13/15 0321  NA 140 139 138  K 4.4 5.2* 4.7  CL 115* 115* 108  CO2 14* 15* 17*  GLUCOSE 291* 152* 149*  BUN 83* 86* 119*  CREATININE 3.49* 3.91* 5.08*  CALCIUM 8.9 8.9 8.3*   Liver Function Tests:  Recent Labs Lab 03/11/15 0712 03/12/15 0037  AST 42* 36  ALT 12* 6*  ALKPHOS 75 68  BILITOT 0.2* 0.5  PROT 7.3 7.4  ALBUMIN 2.7* 2.6*   CBC:  Recent Labs Lab 03/11/15 0712 03/12/15 0037 03/13/15 0321  WBC 9.5 13.1* 11.2*  NEUTROABS 7.9*  --   --   HGB 7.2* 7.9* 7.5*  HCT 23.3* 25.6* 23.8*  MCV 89.6 89.2 87.5  PLT 90* 91* 103*   Cardiac Enzymes:  Recent Labs Lab 03/11/15 0712 03/11/15 1845 03/12/15 0037 03/13/15 0947  CKTOTAL  --   --   --  2147*  TROPONINI 0.05* 0.08* 0.06*  --    BNP (last 3 results)  Recent Labs  11/14/14 1601 11/17/14 0636 03/11/15 0712  BNP 2334.6* 1430.0* 1299.4*   CBG:  Recent Labs Lab 03/12/15 0738 03/12/15 1206 03/12/15 1735 03/12/15 2228  03/13/15 0850  GLUCAP 154* 217* 206* 218* 146*    Recent Results (from the past 240 hour(s))  Culture, blood (Routine X 2) w Reflex to ID Panel     Status: None (Preliminary result)   Collection Time: 03/11/15  8:50 AM  Result Value Ref Range Status   Specimen Description BLOOD RIGHT ANTECUBITAL  Final   Special Requests BOTTLES DRAWN AEROBIC AND ANAEROBIC 10CC  Final   Culture NO GROWTH 2 DAYS  Final   Report Status PENDING  Incomplete  Culture, blood (Routine X 2) w Reflex to ID Panel     Status: None (Preliminary result)   Collection Time: 03/11/15  8:58 AM  Result Value Ref Range Status   Specimen Description BLOOD RIGHT HAND  Final   Special Requests BOTTLES DRAWN AEROBIC ONLY 10CC  Final   Culture NO GROWTH 2 DAYS  Final   Report Status PENDING  Incomplete  MRSA PCR Screening     Status: None   Collection Time: 03/11/15  5:16 PM  Result Value Ref Range Status   MRSA by PCR NEGATIVE NEGATIVE Final    Comment:        The GeneXpert MRSA Assay (FDA approved for NASAL specimens only), is one component of a comprehensive MRSA colonization surveillance program. It is not intended to diagnose MRSA infection nor to guide or monitor treatment for MRSA infections.       Studies/Results: No results found.  Medications:  Scheduled: . aspirin  81 mg Oral Daily  . azithromycin  500 mg Oral Q24H  . carbidopa-levodopa  1 tablet Oral TID  . cefTRIAXone (ROCEPHIN)  IV  1 g Intravenous Q24H  . citalopram  10 mg Oral Daily  . cycloSPORINE  1 drop Both Eyes BID  . diltiazem  300 mg Oral Daily  . fluticasone  2 spray Each Nare Daily  . gabapentin  600 mg Oral Daily  . guaiFENesin  1,200 mg Oral BID  . insulin aspart  0-9 Units Subcutaneous TID WC  . levalbuterol  0.63 mg Nebulization TID  . methylPREDNISolone (SOLU-MEDROL) injection  40 mg Intravenous Q12H  . metoprolol tartrate  25 mg Oral BID  . QUEtiapine  100 mg Oral QHS  . sodium chloride  3 mL Intravenous Q12H  .  tamsulosin  0.4 mg Oral Daily   Continuous: .  sodium bicarbonate  infusion 1000 mL 125 mL/hr at 03/13/15 0808   [id number] **OR** acetaminophen, alum & mag hydroxide-simeth, levalbuterol, ondansetron **OR** ondansetron (ZOFRAN) IV, senna-docusate  Assessment/Plan:  Principal Problem:   Hypoxemia requiring supplemental oxygen Active Problems:   Hepatitis C   Thrombocytopenia (HCC)   Hypertensive heart disease with CHF (HCC)   PERIPHERAL VASCULAR DISEASE   LOW BACK PAIN, CHRONIC  Chronic diastolic heart failure (HCC)   Neurologic gait disorder   PD (Parkinson's disease) ([place])   Malignant neoplasm of prostate (HCC)   COPD (chronic obstructive pulmonary disease) (HCC)   Metabolic acidosis   CKD (chronic kidney disease) stage 4, GFR 15-29 ml/min (HCC)   PAF (paroxysmal atrial fibrillation) (HCC)   CAP (community acquired pneumonia)   Acute on chronic kidney failure (HCC)   Symptomatic anemia   Hyperglycemia    Acute on chronic respiratory failure with hypoxemia Seems to be slightly better. Etiology remains unclear. CT chest report reviewed. The findings more suggestive of pneumonitis. Continue coverage for pneumonia for now. Does not appear to have an element of fluid overload at this time. Continue treatment for COPD exacerbation as well. Monitor closely for now.   Acute on chronic kidney failure with metabolic acidosis/hyperkalemia Renal function continues to get worse. However, it looks like he did make greater than 10100mL of urine in the last 24 hours. Foley catheter was ordered yesterday. However, patient refused. CK level was checked and was noted to be elevated. Since renal function is getting worse, we will consult nephrology. Still appears to be primarily due to volume depletion. ATN cannot be ruled out. Ultrasound of the kidneys will also be ordered. Baseline creatinine is around 2.5. Baseline BUN is 47-50. Creatinine was elevated to 3.49 at the time of admission.  BUN was 83. Continue with IV fluid with bicarbonate. We will increase her rate for now. Await nephrology input. He did have a renal ultrasound in August which revealed medical renal disease. Potassium level is normal now. He was given Kayexalate on 12/17  Anemia likely due to chronic kidney disease  Hemoglobin is low but stable. He was transfused 1 unit of blood at the time of admission. Patient is followed by nephrology as an outpatient and apparently gets marrow stimulating agents. No overt bleeding noted. Continue to monitor.  Chronic Diastolic heart failure BNP was elevated at the time of admission but does not appear as high as it has been in the past.CXR negative for acute pulm edema, Last 2D echo showed and LVEF of 65 - 70%.Continue to hold Lasix for now. He does not exhibit any signs of volume overload at this time. Continue strict ins and outs and daily weights. Mildly elevated troponins noted. Significance unclear. Patient denies any chest pain.  Hyperglycemia No documented history of diabetes.HbA1c is 6.1. Continue sliding scale coverage.  Idiopathic parkinsons with mild dementia Continue Sinemet IR.  Paroxysmal Atrial Fibrillation Currently in sinus rhythm. His episode of atrial fibrillation was back in August. CHADS2VASC his at least 4. However, patient has not been thought to be a candidate for anticoagulation due to compliance issues, thrombocytopenia. Continue Cardizem and metoprolol. Continue aspirin.  Chronic Thrombocytopenia Baseline appears to be in the 120s.Continue to monitor closely.No pharmacologic VTE prophylaxis.  ADDENDUM Called by respiratory therapist that the patient was becoming more and more hypoxic. Stat chest x-ray was obtained which suggests bilateral pulmonary edema. Patient has been getting IV fluids for renal failure. This is an extremely challenging situation considering his chronic kidney disease. Patient will be placed on BiPAP. Discussed with Dr.  Tessa Lerner with nephrology. 160 mg of Lasix to be given intravenously. Continue to monitor closely.  DVT Prophylaxis: SCDs    Code Status: Full code  Family Communication: No family at bedside. Tried calling wife without success. Message left. Disposition Plan: Continue management as outlined above. Continue to remain in step down unit for now.  LOS: 2 days   [facility] Pager [phone] 03/13/2015, 12:02 PM  If 7PM-7AM, please contact night-coverage at [email or web address], password [facility]

## 2015-03-13 NOTE — Consult Note (Signed)
Urology Consult  Referring physician: Bonnielee Haff, MD Reason for referral: Foley catheter placement  Chief Complaint: Need for foley catheter placement  History of Present Illness:   74 yo M with h/o prostate cancer s/p radiation and many other co-morbidities currently admitted to [facility] for pulmonary edema requiring BiPAP and strict I&Os. He was having difficulty voiding earlier today and two unsuccessful attempts were made by RNs at foley catheter placement. He subsequently voided 400 mL.  He was started on high lasix this afternoon for diuresis and requires a foley catheter for borderline urinary retention and strict I&Os. Urology was consulted for foley catheter placement.    Past Medical History  Diagnosis Date  . ALLERGIC RHINITIS 03/28/2009  . ANXIETY 03/28/2009  . CHOLELITHIASIS 03/28/2009  . DEPRESSION 03/28/2009  . DIVERTICULOSIS, COLON 03/28/2009  . 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 FATIGUE 03/28/2009  . HYPERLIPIDEMIA 03/28/2009  . HYPERTENSION 03/28/2009  . LOW BACK PAIN, CHRONIC 03/28/2009  . PERIPHERAL VASCULAR DISEASE 03/28/2009  . Personal history of alcoholism ([place]) 03/28/2009  . THROMBOCYTOPENIA 03/28/2009  . Wheezing 02/07/2010  . CHF (congestive heart failure) ([place])   . Impaired glucose tolerance 12/01/2013  . Heart murmur   . Exertional dyspnea 11/04/2014  . History of stomach ulcers   . HEPATITIS C 03/28/2009  . DEGENERATIVE JOINT DISEASE 03/28/2009  . [place] DISEASE, LUMBAR 03/28/2009  . Arthritis     "everywhere"  . Chronic kidney disease, stage 3     /notes 11/04/2014  . Chronic anemia     [name] 11/04/2014  . Thrombocytopenia ([place])     chronic/notes 11/04/2014  . Malignant neoplasm prostate ([place])     "in treatment for it now" (11/04/2014)  . Pneumonia     Sepsis- suspecting /notes 11/04/2014   Past Surgical History  Procedure Laterality Date  . Shoulder arthroscopy w/ rotator cuff repair Right 2003  . Cataract extraction w/ intraocular lens  implant, bilateral  Bilateral   . Prostate biopsy    . Colonoscopy w/ polypectomy  06/29/2014    polpys proved to be non cancerous done at [facility]    Medications: I have reviewed the patient's current medications. Allergies:  Allergies  Allergen Reactions  . Tramadol Nausea Only    Family History  Problem Relation Age of Onset  . Lung cancer Father   . Stroke Brother   . Breast cancer Daughter    Social History:  reports that he quit smoking about 19 years ago. His smoking use included Cigarettes. He has a 41 pack-year smoking history. He has never used smokeless tobacco. He reports that he uses illicit drugs (Marijuana). He reports that he does not drink alcohol.  ROS  Not obtainable from patient due to confusion  Physical Exam:  Vital signs in last 24 hours: Temp:  [97.2 F (36.2 C)-98.9 F (37.2 C)] 97.2 F (36.2 C) (12/18 1200) Pulse Rate:  [69-123] 69 (12/18 1532) Resp:  [13-24] 13 (12/18 1532) BP: (118-164)/(69-97) 122/69 mmHg (12/18 1200) SpO2:  [77 %-100 %] 97 % (12/18 1532) Weight:  [100.4 kg (221 lb 5.5 oz)] 100.4 kg (221 lb 5.5 oz) (12/18 0418) Physical Exam On bipap now, intermittently distressed, confused Abdomen obese, soft, non-tender Circumcised male phallus Extremities with trace LE edema   Laboratory Data:  Results for orders placed or performed during the hospital encounter of 03/11/15 (from the past 72 hour(s))  Comprehensive metabolic panel     Status: Abnormal   Collection Time: 03/11/15  7:12 AM  Result Value  Ref Range   Sodium 140 135 - 145 mmol/L   Potassium 4.4 3.5 - 5.1 mmol/L   Chloride 115 (H) 101 - 111 mmol/L   CO2 14 (L) 22 - 32 mmol/L   Glucose, Bld 291 (H) 65 - 99 mg/dL   BUN 83 (H) 6 - 20 mg/dL   Creatinine, Ser 3.49 (H) 0.61 - 1.24 mg/dL   Calcium 8.9 8.9 - 10.3 mg/dL   Total Protein 7.3 6.5 - 8.1 g/dL   Albumin 2.7 (L) 3.5 - 5.0 g/dL   AST 42 (H) 15 - 41 U/L   ALT 12 (L) 17 - 63 U/L   Alkaline Phosphatase 75 38 - 126 U/L   Total Bilirubin 0.2 (L)  0.3 - 1.2 mg/dL   GFR calc non Af Amer 16 (L) >60 mL/min   GFR calc Af Amer 18 (L) >60 mL/min    Comment: (NOTE) The eGFR has been calculated using the CKD EPI equation. This calculation has not been validated in all clinical situations. eGFR's persistently <60 mL/min signify possible Chronic Kidney Disease.    Anion gap 11 5 - 15  CBC with Differential     Status: Abnormal   Collection Time: 03/11/15  7:12 AM  Result Value Ref Range   WBC 9.5 4.0 - 10.5 K/uL   RBC 2.60 (L) 4.22 - 5.81 MIL/uL   Hemoglobin 7.2 (L) 13.0 - 17.0 g/dL   HCT 23.3 (L) 39.0 - 52.0 %   MCV 89.6 78.0 - 100.0 fL   MCH 27.7 26.0 - 34.0 pg   MCHC 30.9 30.0 - 36.0 g/dL   RDW 16.3 (H) 11.5 - 15.5 %   Platelets 90 (L) 150 - 400 K/uL    Comment: PLATELET COUNT CONFIRMED BY SMEAR   Neutrophils Relative % 84 %   Neutro Abs 7.9 (H) 1.7 - 7.7 K/uL   Lymphocytes Relative 4 %   Lymphs Abs 0.4 (L) 0.7 - 4.0 K/uL   Monocytes Relative 12 %   Monocytes Absolute 1.1 (H) 0.1 - 1.0 K/uL   Eosinophils Relative 0 %   Eosinophils Absolute 0.0 0.0 - 0.7 K/uL   Basophils Relative 0 %   Basophils Absolute 0.0 0.0 - 0.1 K/uL  Troponin I     Status: Abnormal   Collection Time: 03/11/15  7:12 AM  Result Value Ref Range   Troponin I 0.05 (H) <0.031 ng/mL    Comment:        PERSISTENTLY INCREASED TROPONIN VALUES IN THE RANGE OF 0.04-0.49 ng/mL CAN BE SEEN IN:       -UNSTABLE ANGINA       -CONGESTIVE HEART FAILURE       -MYOCARDITIS       -CHEST TRAUMA       -ARRYHTHMIAS       -LATE PRESENTING MYOCARDIAL INFARCTION       -COPD   CLINICAL FOLLOW-UP RECOMMENDED.   Brain natriuretic peptide     Status: Abnormal   Collection Time: 03/11/15  7:12 AM  Result Value Ref Range   B Natriuretic Peptide 1299.4 (H) 0.0 - 100.0 pg/mL  I-Stat CG4 Lactic Acid, ED     Status: Abnormal   Collection Time: 03/11/15  7:59 AM  Result Value Ref Range   Lactic Acid, Venous 2.48 (HH) 0.5 - 2.0 mmol/L   Comment NOTIFIED PHYSICIAN   POC  occult blood, ED RN will collect     Status: None   Collection Time: 03/11/15  8:36 AM  Result Value  Ref Range   Fecal Occult Bld NEGATIVE NEGATIVE  Culture, blood (Routine X 2) w Reflex to ID Panel     Status: None (Preliminary result)   Collection Time: 03/11/15  8:50 AM  Result Value Ref Range   Specimen Description BLOOD RIGHT ANTECUBITAL    Special Requests BOTTLES DRAWN AEROBIC AND ANAEROBIC 10CC    Culture NO GROWTH 2 DAYS    Report Status PENDING   Culture, blood (Routine X 2) w Reflex to ID Panel     Status: None (Preliminary result)   Collection Time: 03/11/15  8:58 AM  Result Value Ref Range   Specimen Description BLOOD RIGHT HAND    Special Requests BOTTLES DRAWN AEROBIC ONLY 10CC    Culture NO GROWTH 2 DAYS    Report Status PENDING   Type and screen     Status: None   Collection Time: 03/11/15  9:00 AM  Result Value Ref Range   ABO/RH(D) O POS    Antibody Screen NEG    Sample Expiration 03/14/2015    Unit Number [id number]    Blood Component Type RED CELLS,LR    Unit division 00    Status of Unit ISSUED,FINAL    Transfusion Status OK TO TRANSFUSE    Crossmatch Result Compatible   Urinalysis, Routine w reflex microscopic (not at [facility])     Status: Abnormal   Collection Time: 03/11/15  9:53 AM  Result Value Ref Range   Color, Urine YELLOW YELLOW   APPearance CLOUDY (A) CLEAR   Specific Gravity, Urine 1.016 1.005 - 1.030   pH 5.0 5.0 - 8.0   Glucose, UA NEGATIVE NEGATIVE mg/dL   Hgb urine dipstick MODERATE (A) NEGATIVE   Bilirubin Urine NEGATIVE NEGATIVE   Ketones, ur NEGATIVE NEGATIVE mg/dL   Protein, ur 30 (A) NEGATIVE mg/dL   Nitrite NEGATIVE NEGATIVE   Leukocytes, UA NEGATIVE NEGATIVE  Urine microscopic-add on     Status: Abnormal   Collection Time: 03/11/15  9:53 AM  Result Value Ref Range   Squamous Epithelial / LPF 0-5 (A) NONE SEEN   WBC, UA 0-5 0 - 5 WBC/hpf   RBC / HPF 0-5 0 - 5 RBC/hpf   Bacteria, UA FEW (A) NONE SEEN   Casts GRANULAR CAST (A)  NEGATIVE   Urine-Other AMORPHOUS URATES/PHOSPHATES   Prepare RBC     Status: None   Collection Time: 03/11/15  9:56 AM  Result Value Ref Range   Order Confirmation ORDER PROCESSED BY BLOOD BANK   I-Stat arterial blood gas, ED     Status: Abnormal   Collection Time: 03/11/15 10:03 AM  Result Value Ref Range   pH, Arterial 7.169 (LL) 7.350 - 7.450   pCO2 arterial 35.0 35.0 - 45.0 mmHg   pO2, Arterial 40.0 (L) 80.0 - 100.0 mmHg   Bicarbonate 12.7 (L) 20.0 - 24.0 mEq/L   TCO2 14 0 - 100 mmol/L   O2 Saturation 61.0 %   Acid-base deficit 15.0 (H) 0.0 - 2.0 mmol/L   Patient temperature HIDE    Sample type ARTERIAL    Comment NOTIFIED PHYSICIAN   I-Stat arterial blood gas, ED     Status: Abnormal   Collection Time: 03/11/15 11:47 AM  Result Value Ref Range   pH, Arterial 7.172 (LL) 7.350 - 7.450   pCO2 arterial 33.9 (L) 35.0 - 45.0 mmHg   pO2, Arterial 63.0 (L) 80.0 - 100.0 mmHg   Bicarbonate 12.4 (L) 20.0 - 24.0 mEq/L   TCO2 13 0 -  100 mmol/L   O2 Saturation 86.0 %   Acid-base deficit 15.0 (H) 0.0 - 2.0 mmol/L   Patient temperature 98.6 F    Collection site RADIAL, ALLEN'S TEST ACCEPTABLE    Drawn by Operator    Sample type ARTERIAL    Comment MD NOTIFIED, REPEAT TEST   CBG monitoring, ED     Status: None   Collection Time: 03/11/15 12:57 PM  Result Value Ref Range   Glucose-Capillary 92 65 - 99 mg/dL  Blood gas, arterial     Status: Abnormal   Collection Time: 03/11/15  4:28 PM  Result Value Ref Range   FIO2 0.50    Delivery systems CONTINUOUS POSITIVE AIRWAY PRESSURE    Mode CONTINUOUS POSITIVE AIRWAY PRESSURE    Inspiratory PAP 10.0    pH, Arterial 7.117 (LL) 7.350 - 7.450    Comment: CRITICAL RESULT CALLED TO, READ BACK BY AND VERIFIED WITH: STEVE MORAN,RRT,RCP AT 1640 BY SARAH BROWNING,RRT,RCP ON 03/11/2015    pCO2 arterial 42.1 35.0 - 45.0 mmHg   pO2, Arterial 127 (H) 80.0 - 100.0 mmHg   Bicarbonate 13.0 (L) 20.0 - 24.0 mEq/L   TCO2 14.3 0 - 100 mmol/L   Acid-base  deficit 14.6 (H) 0.0 - 2.0 mmol/L   O2 Saturation 98.0 %   Patient temperature 98.6    Collection site LEFT RADIAL    Drawn by [phone]    Sample type ARTERIAL DRAW    Allens test (pass/fail) PASS PASS  MRSA PCR Screening     Status: None   Collection Time: 03/11/15  5:16 PM  Result Value Ref Range   MRSA by PCR NEGATIVE NEGATIVE    Comment:        The GeneXpert MRSA Assay (FDA approved for NASAL specimens only), is one component of a comprehensive MRSA colonization surveillance program. It is not intended to diagnose MRSA infection nor to guide or monitor treatment for MRSA infections.   Glucose, capillary     Status: Abnormal   Collection Time: 03/11/15  6:11 PM  Result Value Ref Range   Glucose-Capillary 142 (H) 65 - 99 mg/dL  Troponin I (q 6hr x 3)     Status: Abnormal   Collection Time: 03/11/15  6:45 PM  Result Value Ref Range   Troponin I 0.08 (H) <0.031 ng/mL    Comment:        PERSISTENTLY INCREASED TROPONIN VALUES IN THE RANGE OF 0.04-0.49 ng/mL CAN BE SEEN IN:       -UNSTABLE ANGINA       -CONGESTIVE HEART FAILURE       -MYOCARDITIS       -CHEST TRAUMA       -ARRYHTHMIAS       -LATE PRESENTING MYOCARDIAL INFARCTION       -COPD   CLINICAL FOLLOW-UP RECOMMENDED.   Hemoglobin A1c     Status: Abnormal   Collection Time: 03/11/15  6:45 PM  Result Value Ref Range   Hgb A1c MFr Bld 6.1 (H) 4.8 - 5.6 %    Comment: (NOTE)         Pre-diabetes: 5.7 - 6.4         Diabetes: >6.4         Glycemic control for adults with diabetes: <7.0    Mean Plasma Glucose 128 mg/dL    Comment: (NOTE) Performed At: [facility] [address] [place], [place] [id number] Mila Homer MD [id number]   Glucose, capillary     Status: Abnormal  Collection Time: 03/11/15  9:48 PM  Result Value Ref Range   Glucose-Capillary 141 (H) 65 - 99 mg/dL  Troponin I (q 6hr x 3)     Status: Abnormal   Collection Time: 03/12/15 12:37 AM  Result Value Ref Range   Troponin I  0.06 (H) <0.031 ng/mL    Comment:        PERSISTENTLY INCREASED TROPONIN VALUES IN THE RANGE OF 0.04-0.49 ng/mL CAN BE SEEN IN:       -UNSTABLE ANGINA       -CONGESTIVE HEART FAILURE       -MYOCARDITIS       -CHEST TRAUMA       -ARRYHTHMIAS       -LATE PRESENTING MYOCARDIAL INFARCTION       -COPD   CLINICAL FOLLOW-UP RECOMMENDED.   Comprehensive metabolic panel     Status: Abnormal   Collection Time: 03/12/15 12:37 AM  Result Value Ref Range   Sodium 139 135 - 145 mmol/L   Potassium 5.2 (H) 3.5 - 5.1 mmol/L   Chloride 115 (H) 101 - 111 mmol/L   CO2 15 (L) 22 - 32 mmol/L   Glucose, Bld 152 (H) 65 - 99 mg/dL   BUN 86 (H) 6 - 20 mg/dL   Creatinine, Ser 3.91 (H) 0.61 - 1.24 mg/dL   Calcium 8.9 8.9 - 10.3 mg/dL   Total Protein 7.4 6.5 - 8.1 g/dL   Albumin 2.6 (L) 3.5 - 5.0 g/dL   AST 36 15 - 41 U/L   ALT 6 (L) 17 - 63 U/L   Alkaline Phosphatase 68 38 - 126 U/L   Total Bilirubin 0.5 0.3 - 1.2 mg/dL   GFR calc non Af Amer 14 (L) >60 mL/min   GFR calc Af Amer 16 (L) >60 mL/min    Comment: (NOTE) The eGFR has been calculated using the CKD EPI equation. This calculation has not been validated in all clinical situations. eGFR's persistently <60 mL/min signify possible Chronic Kidney Disease.    Anion gap 9 5 - 15  CBC     Status: Abnormal   Collection Time: 03/12/15 12:37 AM  Result Value Ref Range   WBC 13.1 (H) 4.0 - 10.5 K/uL   RBC 2.87 (L) 4.22 - 5.81 MIL/uL   Hemoglobin 7.9 (L) 13.0 - 17.0 g/dL   HCT 25.6 (L) 39.0 - 52.0 %   MCV 89.2 78.0 - 100.0 fL   MCH 27.5 26.0 - 34.0 pg   MCHC 30.9 30.0 - 36.0 g/dL   RDW 15.6 (H) 11.5 - 15.5 %   Platelets 91 (L) 150 - 400 K/uL    Comment: REPEATED TO VERIFY CONSISTENT WITH PREVIOUS RESULT   Glucose, capillary     Status: Abnormal   Collection Time: 03/12/15  7:38 AM  Result Value Ref Range   Glucose-Capillary 154 (H) 65 - 99 mg/dL  Glucose, capillary     Status: Abnormal   Collection Time: 03/12/15 12:06 PM  Result Value  Ref Range   Glucose-Capillary 217 (H) 65 - 99 mg/dL  Glucose, capillary     Status: Abnormal   Collection Time: 03/12/15  5:35 PM  Result Value Ref Range   Glucose-Capillary 206 (H) 65 - 99 mg/dL  Glucose, capillary     Status: Abnormal   Collection Time: 03/12/15 10:28 PM  Result Value Ref Range   Glucose-Capillary 218 (H) 65 - 99 mg/dL  CBC     Status: Abnormal   Collection Time: 03/13/15  3:21  AM  Result Value Ref Range   WBC 11.2 (H) 4.0 - 10.5 K/uL   RBC 2.72 (L) 4.22 - 5.81 MIL/uL   Hemoglobin 7.5 (L) 13.0 - 17.0 g/dL   HCT 23.8 (L) 39.0 - 52.0 %   MCV 87.5 78.0 - 100.0 fL   MCH 27.6 26.0 - 34.0 pg   MCHC 31.5 30.0 - 36.0 g/dL   RDW 15.8 (H) 11.5 - 15.5 %   Platelets 103 (L) 150 - 400 K/uL    Comment: CONSISTENT WITH PREVIOUS RESULT  Basic metabolic panel     Status: Abnormal   Collection Time: 03/13/15  3:21 AM  Result Value Ref Range   Sodium 138 135 - 145 mmol/L   Potassium 4.7 3.5 - 5.1 mmol/L   Chloride 108 101 - 111 mmol/L   CO2 17 (L) 22 - 32 mmol/L   Glucose, Bld 149 (H) 65 - 99 mg/dL   BUN 119 (H) 6 - 20 mg/dL   Creatinine, Ser 5.08 (H) 0.61 - 1.24 mg/dL   Calcium 8.3 (L) 8.9 - 10.3 mg/dL   GFR calc non Af Amer 10 (L) >60 mL/min   GFR calc Af Amer 12 (L) >60 mL/min    Comment: (NOTE) The eGFR has been calculated using the CKD EPI equation. This calculation has not been validated in all clinical situations. eGFR's persistently <60 mL/min signify possible Chronic Kidney Disease.    Anion gap 13 5 - 15  Glucose, capillary     Status: Abnormal   Collection Time: 03/13/15  8:50 AM  Result Value Ref Range   Glucose-Capillary 146 (H) 65 - 99 mg/dL  CK     Status: Abnormal   Collection Time: 03/13/15  9:47 AM  Result Value Ref Range   Total CK 2147 (H) 49 - 397 U/L  Glucose, capillary     Status: Abnormal   Collection Time: 03/13/15  1:02 PM  Result Value Ref Range   Glucose-Capillary 159 (H) 65 - 99 mg/dL  Blood gas, arterial     Status: Abnormal    Collection Time: 03/13/15  2:40 PM  Result Value Ref Range   O2 Content 8.0 L/min   Delivery systems NEB TX    pH, Arterial 7.185 (LL) 7.350 - 7.450    Comment: CRITICAL RESULT CALLED TO, READ BACK BY AND VERIFIED WITH: DR.KRISHNAN,MD AT 1443,BY LAUREN DOYLE RRT,RCP ON 03/13/15    pCO2 arterial 45.4 (H) 35.0 - 45.0 mmHg   pO2, Arterial 78.4 (L) 80.0 - 100.0 mmHg   Bicarbonate 16.5 (L) 20.0 - 24.0 mEq/L   TCO2 17.9 0 - 100 mmol/L   Acid-base deficit 10.3 (H) 0.0 - 2.0 mmol/L   O2 Saturation 93.2 %   Patient temperature 98.6    Collection site RIGHT RADIAL    Drawn by [id number]    Sample type ARTERIAL DRAW    Allens test (pass/fail) PASS PASS   Recent Results (from the past 240 hour(s))  Culture, blood (Routine X 2) w Reflex to ID Panel     Status: None (Preliminary result)   Collection Time: 03/11/15  8:50 AM  Result Value Ref Range Status   Specimen Description BLOOD RIGHT ANTECUBITAL  Final   Special Requests BOTTLES DRAWN AEROBIC AND ANAEROBIC 10CC  Final   Culture NO GROWTH 2 DAYS  Final   Report Status PENDING  Incomplete  Culture, blood (Routine X 2) w Reflex to ID Panel     Status: None (Preliminary result)   Collection Time:  03/11/15  8:58 AM  Result Value Ref Range Status   Specimen Description BLOOD RIGHT HAND  Final   Special Requests BOTTLES DRAWN AEROBIC ONLY 10CC  Final   Culture NO GROWTH 2 DAYS  Final   Report Status PENDING  Incomplete  MRSA PCR Screening     Status: None   Collection Time: 03/11/15  5:16 PM  Result Value Ref Range Status   MRSA by PCR NEGATIVE NEGATIVE Final    Comment:        The GeneXpert MRSA Assay (FDA approved for NASAL specimens only), is one component of a comprehensive MRSA colonization surveillance program. It is not intended to diagnose MRSA infection nor to guide or monitor treatment for MRSA infections.    Creatinine:  Recent Labs  03/11/15 0712 03/12/15 0037 03/13/15 0321  CREATININE 3.49* 3.91* 5.08*     Impression/Assessment:  74 yo M with h/o PCa s/p radiation here with pulmonary edema requiring foley catheter placement for strict I&Os during lasix diuresis. 64F coude foley catheter was placed without resistance and return of ~300 mL yellow urine.  Plan:  - Foley catheter placed without difficulty - Foley catheter removal and TOV per primary team - Follow up with Dr. Pilar Jarvis of [facility] PRN - Urology will sign off  Acie Fredrickson 03/13/2015, 5:01 PM

## 2015-03-13 NOTE — Progress Notes (Signed)
Pt placed on Bipap at this time per MD order.  Pt tolerating well, RT will monitor  

## 2015-03-13 NOTE — Progress Notes (Signed)
PT Cancellation Note  Patient Details Name: AMRAM PLYMIRE MRN: [id number] DOB: 01-19-1941   Cancelled Treatment:    Reason Eval/Treat Not Completed: Medical issues which prohibited therapy- pt on nonrebreather currently, discussed with RN who states pt's breathing was worse today and pt slightly confused. Will hold eval and reattempt next date as pt is medically appropriate.  Thanks,    Herbie Drape 03/13/2015, 12:14 PM

## 2015-03-13 NOTE — Progress Notes (Signed)
Utilization Review Completed.Gerald Gomez T12/18/2016  

## 2015-03-13 NOTE — Procedures (Signed)
Foley Catheter Placement Note  Indications: Urinary retention and need for strict I&Os during aggressive diuresis  Pre-operative Diagnosis: Urinary retention  Post-operative Diagnosis: Same  Surgeon: Baruch Gouty  Resident: E. Harvel Ricks, MD  Assistants: None  Procedure Details  Patient was placed in the supine position, prepped with Betadine and draped in the usual sterile fashion.  We injected lubricant per urethra prior to the procedure.  We then inserted a 18 [place] coude catheter per urethra which easily passed into the bladder without minimal resistance at the prostatic urethra.  We achieved return of clear yellow urine and then proceeded to insert 10 mL of sterile water into the Foley balloon.  The catheter was attached to a drainage bag and secured with a StatLock.  Placement of the catheter had return of greater than 300 mL of light yellow, cloudy urine.               Complications: None; patient tolerated the procedure well.  Plan:   1.  Foley catheter removal per primary team  Attending Attestation: Dr. Pilar Jarvis was available.

## 2015-03-14 LAB — FERRITIN: FERRITIN: 49 ng/mL (ref 24–336)

## 2015-03-14 LAB — BASIC METABOLIC PANEL
ANION GAP: 17 — AB (ref 5–15)
BUN: 147 mg/dL — ABNORMAL HIGH (ref 6–20)
CHLORIDE: 102 mmol/L (ref 101–111)
CO2: 16 mmol/L — AB (ref 22–32)
Calcium: 7.7 mg/dL — ABNORMAL LOW (ref 8.9–10.3)
Creatinine, Ser: 5.89 mg/dL — ABNORMAL HIGH (ref 0.61–1.24)
GFR calc non Af Amer: 8 mL/min — ABNORMAL LOW (ref >60)
GFR, EST AFRICAN AMERICAN: 10 mL/min — AB (ref >60)
Glucose, Bld: 289 mg/dL — ABNORMAL HIGH (ref 65–99)
POTASSIUM: 4 mmol/L (ref 3.5–5.1)
SODIUM: 135 mmol/L (ref 135–145)

## 2015-03-14 LAB — GLUCOSE, CAPILLARY
GLUCOSE-CAPILLARY: 204 mg/dL — AB (ref 65–99)
GLUCOSE-CAPILLARY: 313 mg/dL — AB (ref 65–99)
Glucose-Capillary: 244 mg/dL — ABNORMAL HIGH (ref 65–99)
Glucose-Capillary: 282 mg/dL — ABNORMAL HIGH (ref 65–99)

## 2015-03-14 LAB — IRON AND TIBC
IRON: 13 ug/dL — AB (ref 45–182)
SATURATION RATIOS: 4 % — AB (ref 17.9–39.5)
TIBC: 326 ug/dL (ref 250–450)
UIBC: 313 ug/dL

## 2015-03-14 LAB — CBC
HEMATOCRIT: 24.5 % — AB (ref 39.0–52.0)
HEMOGLOBIN: 7.6 g/dL — AB (ref 13.0–17.0)
MCH: 27 pg (ref 26.0–34.0)
MCHC: 31 g/dL (ref 30.0–36.0)
MCV: 87.2 fL (ref 78.0–100.0)
Platelets: 114 10*3/uL — ABNORMAL LOW (ref 150–400)
RBC: 2.81 MIL/uL — AB (ref 4.22–5.81)
RDW: 15.4 % (ref 11.5–15.5)
WBC: 10.1 10*3/uL (ref 4.0–10.5)

## 2015-03-14 MED ORDER — SODIUM BICARBONATE 650 MG PO TABS
650.0000 mg | ORAL_TABLET | Freq: Three times a day (TID) | ORAL | Status: DC
  Administered 2015-03-14 – 2015-03-15 (×6): 650 mg via ORAL
  Filled 2015-03-14 (×6): qty 1

## 2015-03-14 NOTE — Progress Notes (Signed)
S: Eating well.  Denies uremic Sxs O:BP 143/79 mmHg  Pulse 67  Temp(Src) 97.9 F (36.6 C) (Axillary)  Resp 14  Ht 6' 0.5" (1.842 m)  Wt 102.5 kg (225 lb 15.5 oz)  BMI 30.21 kg/m2  SpO2 100%  Intake/Output Summary (Last 24 hours) at 03/14/15 0844 Last data filed at 03/14/15 0359  Gross per 24 hour  Intake   1643 ml  Output    500 ml  Net   1143 ml   Weight change: 2.1 kg (4 lb 10.1 oz) GBE:EFEOF and alert HQR:FXJOI. irreg Resp: Bilat crackles Rt>LT, scattered whezes Abd:+ BS NTND TGP:QDIYM edema NEURO:CNI OX3 + asterixis Foley cath  in place   . aspirin  81 mg Oral Daily  . azithromycin  500 mg Oral Q24H  . carbidopa-levodopa  1 tablet Oral TID  . cefTRIAXone (ROCEPHIN)  IV  1 g Intravenous Q24H  . citalopram  10 mg Oral Daily  . cycloSPORINE  1 drop Both Eyes BID  . diltiazem  300 mg Oral Daily  . fluticasone  2 spray Each Nare Daily  . furosemide  160 mg Intravenous Q6H  . gabapentin  300 mg Oral Daily  . guaiFENesin  1,200 mg Oral BID  . insulin aspart  0-9 Units Subcutaneous TID WC  . levalbuterol  0.63 mg Nebulization TID  . methylPREDNISolone (SOLU-MEDROL) injection  40 mg Intravenous Q12H  . metoprolol tartrate  25 mg Oral BID  . QUEtiapine  100 mg Oral QHS  . sodium chloride  3 mL Intravenous Q12H  . tamsulosin  0.4 mg Oral Daily   US Renal  03/13/2015  CLINICAL DATA:  Acute renal failure. Chronic kidney disease, stage III. Hypertension. EXAM: RENAL / URINARY TRACT ULTRASOUND COMPLETE COMPARISON:  None. FINDINGS: Right Kidney: Length: 11.3 cm. Right renal cortex is echogenic but normal in thickness. No renal mass, cyst, stone or hydronephrosis. Left Kidney: Length: 11.4 cm. Left renal cortex is also echogenic but normal in thickness. No renal mass, cyst, stone or hydronephrosis. Bladder: Bladder wall appears mildly thickened but this is likely related to partial decompression. IMPRESSION: 1. Both kidneys appear echogenic compatible with the given history of  chronic medical renal disease. No mass, cyst, stone or hydronephrosis bilaterally. 2. Bladder walls appear mildly thickened but this is likely related to the partial bladder decompression. Electronically Signed   By: Franki Cabot M.D.   On: 03/13/2015 13:00   Dg Chest Port 1 View  03/13/2015  CLINICAL DATA:  Community acquired pneumonia with dyspnea EXAM: PORTABLE CHEST - 1 VIEW COMPARISON:  03/11/2015 FINDINGS: Cardiac shadow is stable. Increasing infiltrative densities are noted bilaterally particularly in the right upper lobe. Given the appearance on recent CT examination this likely represents progression of pulmonary edema. No bony abnormality is noted. IMPRESSION: Progressive infiltrates bilaterally but particularly notable in the right upper lobe. Pulmonary edema is again favored. Electronically Signed   By: Inez Catalina M.D.   On: 03/13/2015 15:04   BMET    Component Value Date/Time   NA 135 03/14/2015 0256   K 4.0 03/14/2015 0256   CL 102 03/14/2015 0256   CO2 16* 03/14/2015 0256   GLUCOSE 289* 03/14/2015 0256   BUN 147* 03/14/2015 0256   CREATININE 5.89* 03/14/2015 0256   CALCIUM 7.7* 03/14/2015 0256   GFRNONAA 8* 03/14/2015 0256   GFRAA 10* 03/14/2015 0256   CBC    Component Value Date/Time   WBC 10.1 03/14/2015 0256   WBC 5.1 11/23/2008 1045  RBC 2.81* 03/14/2015 0256   RBC 5.05 11/23/2008 1045   HGB 7.6* 03/14/2015 0256   HGB 14.4 11/23/2008 1045   HCT 24.5* 03/14/2015 0256   HCT 43.1 11/23/2008 1045   PLT 114* 03/14/2015 0256   PLT 73* 11/23/2008 1045   MCV 87.2 03/14/2015 0256   MCV 85.3 11/23/2008 1045   MCH 27.0 03/14/2015 0256   MCH 28.5 11/23/2008 1045   MCHC 31.0 03/14/2015 0256   MCHC 33.4 11/23/2008 1045   RDW 15.4 03/14/2015 0256   RDW 13.2 11/23/2008 1045   LYMPHSABS 0.4* 03/11/2015 0712   LYMPHSABS 2.4 11/23/2008 1045   MONOABS 1.1* 03/11/2015 0712   MONOABS 0.6 11/23/2008 1045   EOSABS 0.0 03/11/2015 0712   EOSABS 0.2 11/23/2008 1045    BASOSABS 0.0 03/11/2015 0712   BASOSABS 0.0 11/23/2008 1045     Assessment: 1. Acute on CKD 4.  Scr higher, UO ? As none recorded for 1st shift yest?? 2. PNA vs Pulm edema 3. A fib 4. Met acidosis 5. Anemia, on procrit as outpt  Plan: 1. I discussed the possibility of HD with pt if renal fx cont to worsen but he does not want to engage in a conversation about.  He is not in need of HD now but if renal fx cont to worsen, he may need it if he chooses.  I told him to think about it. 2. Cont IV lasix 3. Daily Scr 4. Start Na bicarb PO 5. Check iron studies   Devany Aja T

## 2015-03-14 NOTE — Evaluation (Signed)
Occupational Therapy Evaluation Patient Details Name: KEIGAN SALDARRIAGA MRN: [id number] DOB: [birth year] Today's Date: 03/14/2015    History of Present Illness Ignasio Roseberry is a 74 y.o. male, with idiopathic parkinsonism, mild dementia, diastolic heart failure, stage IV chronic kidney disease, peripheral vascular disease, and thrombocytopenia. He presents to the emergency department with dyspnea on exertion. Per chart pt intermittently confused at baseline. Admitted with acute hypoxic respiratory failure, symptomatic anemia, acute renal failure with acidosis, probable pneumonia ? Aspiration with vomiting.    Clinical Impression   These 74 yo male admitted with above presents to acute OT with decreased balance, decreased mobility, decreased safety awareness, and generalized weakness all affecting his ability to perform his BADLs at home at a S level. He will benefit from acute OT with follow up at SNF to get to a S level or better to return home with wife.    Follow Up Recommendations  SNF    Equipment Recommendations   (TBD next venue)       Precautions / Restrictions Precautions Precautions: Fall Precaution Comments: watch O2 sats Restrictions Weight Bearing Restrictions: No      Mobility Bed Mobility Overal bed mobility: Needs Assistance Bed Mobility: Supine to Sit     Supine to sit: Min guard;HOB elevated     General bed mobility comments: VCs for sequencing and hand placement  Transfers Overall transfer level: Needs assistance Equipment used: Rolling walker (2 wheeled) Transfers: Sit to/from Stand Sit to Stand: Min assist;+2 physical assistance;From elevated surface              Balance Overall balance assessment: Needs assistance Sitting-balance support: Bilateral upper extremity supported;Feet supported Sitting balance-Leahy Scale: Poor     Standing balance support: Bilateral upper extremity supported Standing balance-Leahy Scale: Poor Standing balance  comment: reliant on RW                            ADL Overall ADL's : Needs assistance/impaired Eating/Feeding: Independent;Sitting   Grooming: Set up;Sitting;Supervision/safety   Upper Body Bathing: Set up;Sitting;Supervision/ safety   Lower Body Bathing: Moderate assistance (with min A +2 sit<>stand from raised bed)   Upper Body Dressing : Minimal assistance;Sitting   Lower Body Dressing: Maximal assistance (with min A +2 sit<>stand from raised bed)   Toilet Transfer: Moderate assistance;+2 for physical assistance;Ambulation;RW Toilet Transfer Details (indicate cue type and reason): bed (raised) around to recliner (plopped down hard) Toileting- Clothing Manipulation and Hygiene: Moderate assistance (with min A +2 sit<>stand from raised bed)                         Pertinent Vitals/Pain Pain Assessment: No/denies pain     Hand Dominance Right   Extremity/Trunk Assessment Upper Extremity Assessment Upper Extremity Assessment: Generalized weakness   Lower Extremity Assessment Lower Extremity Assessment: Defer to PT evaluation          Cognition Arousal/Alertness: Awake/alert Behavior During Therapy: WFL for tasks assessed/performed Overall Cognitive Status:  (dereased safety awareness with RW)                                Home Living Family/patient expects to be discharged to:: Private residence Living Arrangements: Spouse/significant other Available Help at Discharge: Family;Available PRN/intermittently Type of Home: House Home Access: Stairs to enter [employer] of Steps: 2 Entrance Stairs-Rails: None Home Layout: One level  Bathroom Shower/Tub: Risk analyst characteristics: Architectural technologist: Standard     Home Equipment: Environmental consultant - 2 wheels;Shower seat;Toilet riser;Grab bars - tub/shower;Grab bars - toilet;Cane - single point   Additional Comments: sleeps in recliner at home      Prior  Functioning/Environment Level of Independence: Independent with assistive device(s)        Comments: Amb with walker or cane    OT Diagnosis: Generalized weakness;Cognitive deficits   OT Problem List: Decreased strength;Decreased activity tolerance;Impaired balance (sitting and/or standing);Decreased cognition;Decreased knowledge of use of DME or AE   OT Treatment/Interventions: Self-care/ADL training;Patient/family education;Balance training;DME and/or AE instruction;Cognitive remediation/compensation;Therapeutic activities    OT Goals(Current goals can be found in the care plan section) Acute Rehab OT Goals Patient Stated Goal: to go home OT Goal Formulation: With patient Time For Goal Achievement: 03/28/15 Potential to Achieve Goals: Good  OT Frequency: Min 2X/week           Co-evaluation PT/OT/SLP Co-Evaluation/Treatment: Yes Reason for Co-Treatment: For patient/therapist safety   OT goals addressed during session: ADL's and self-care;Strengthening/ROM      End of Session Equipment Utilized During Treatment: Gait belt;Rolling walker  Activity Tolerance: Patient tolerated treatment well Patient left: in chair;with call bell/phone within reach;with chair alarm set   Time: [id number] OT Time Calculation (min): 22 min Charges:  OT General Charges $OT Visit: 1 Procedure OT Evaluation $Initial OT Evaluation Tier I: 1 Procedure  Almon Register [id number] 03/14/2015, 11:05 AM

## 2015-03-14 NOTE — Progress Notes (Signed)
[facility] PROGRESS NOTE  Gerald Gomez [id number] DOB: Dec 29, 1940 DOA: 03/11/2015  PCP: [name], MD  Brief HPI: 74 year old African-American male with past medical history of idiopathic parkinsonism, mild dementia, diastolic heart failure, stage IV chronic kidney disease, thrombocytopenia, presented with complaints of shortness of breath. He was admitted to the hospital for possible community-acquired pneumonia. Also noted to have acute on chronic renal failure.  Past medical history:  Past Medical History  Diagnosis Date  . ALLERGIC RHINITIS 03/28/2009  . ANXIETY 03/28/2009  . CHOLELITHIASIS 03/28/2009  . DEPRESSION 03/28/2009  . DIVERTICULOSIS, COLON 03/28/2009  . 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 FATIGUE 03/28/2009  . HYPERLIPIDEMIA 03/28/2009  . HYPERTENSION 03/28/2009  . LOW BACK PAIN, CHRONIC 03/28/2009  . PERIPHERAL VASCULAR DISEASE 03/28/2009  . Personal history of alcoholism ([place]) 03/28/2009  . THROMBOCYTOPENIA 03/28/2009  . Wheezing 02/07/2010  . CHF (congestive heart failure) ([place])   . Impaired glucose tolerance 12/01/2013  . Heart murmur   . Exertional dyspnea 11/04/2014  . History of stomach ulcers   . HEPATITIS C 03/28/2009  . DEGENERATIVE JOINT DISEASE 03/28/2009  . [place] DISEASE, LUMBAR 03/28/2009  . Arthritis     "everywhere"  . Chronic kidney disease, stage 3     /notes 11/04/2014  . Chronic anemia     [name] 11/04/2014  . Thrombocytopenia ([place])     chronic/notes 11/04/2014  . Malignant neoplasm prostate ([place])     "in treatment for it now" (11/04/2014)  . Pneumonia     Sepsis- suspecting /notes 11/04/2014    Consultants: None  Procedures: None  Antibiotics: Ceftriaxone and azithromycin 12/16  Subjective: Patient became more hypoxic yesterday. He was placed on BiPAP. He is feeling better. Denies any chest pain.   Objective: Vital Signs  Filed Vitals:   03/14/15 0333 03/14/15 0359 03/14/15 0737 03/14/15 0746  BP: 127/76 133/80 143/79   Pulse: 68 73  67   Temp:  97.4 F (36.3 C) 97.9 F (36.6 C)   TempSrc:  Axillary Axillary   Resp: 13 25 14    Height:      Weight:  102.5 kg (225 lb 15.5 oz)    SpO2:   100% 100%    Intake/Output Summary (Last 24 hours) at 03/14/15 0801 Last data filed at 03/14/15 0359  Gross per 24 hour  Intake   1643 ml  Output    500 ml  Net   1143 ml   Filed Weights   03/12/15 0500 03/13/15 0418 03/14/15 0359  Weight: 99.7 kg (219 lb 12.8 oz) 100.4 kg (221 lb 5.5 oz) 102.5 kg (225 lb 15.5 oz)    General appearance: alert, cooperative, appears stated age and no distress Resp: crackles at the bases. No rhonchi. No wheezing. Cardio: S1, S2 normal regular , no murmur, click, rub or gallop GI: soft, non-tender; bowel sounds normal; no masses,  no organomegaly Extremities: extremities normal, atraumatic, no cyanosis or edema Neurologic: Alert. Moving all of his extremities. No facial asymmetry.  Lab Results:  Basic Metabolic Panel:  Recent Labs Lab 03/11/15 0712 03/12/15 0037 03/13/15 0321 03/14/15 0256  NA 140 139 138 135  K 4.4 5.2* 4.7 4.0  CL 115* 115* 108 102  CO2 14* 15* 17* 16*  GLUCOSE 291* 152* 149* 289*  BUN 83* 86* 119* 147*  CREATININE 3.49* 3.91* 5.08* 5.89*  CALCIUM 8.9 8.9 8.3* 7.7*   Liver Function Tests:  Recent Labs Lab 03/11/15 0712 03/12/15 0037  AST 42* 36  ALT 12*  6*  ALKPHOS 75 68  BILITOT 0.2* 0.5  PROT 7.3 7.4  ALBUMIN 2.7* 2.6*   CBC:  Recent Labs Lab 03/11/15 0712 03/12/15 0037 03/13/15 0321 03/14/15 0256  WBC 9.5 13.1* 11.2* 10.1  NEUTROABS 7.9*  --   --   --   HGB 7.2* 7.9* 7.5* 7.6*  HCT 23.3* 25.6* 23.8* 24.5*  MCV 89.6 89.2 87.5 87.2  PLT 90* 91* 103* 114*   Cardiac Enzymes:  Recent Labs Lab 03/11/15 0712 03/11/15 1845 03/12/15 0037 03/13/15 0947  CKTOTAL  --   --   --  2147*  TROPONINI 0.05* 0.08* 0.06*  --    BNP (last 3 results)  Recent Labs  11/14/14 1601 11/17/14 0636 03/11/15 0712  BNP 2334.6* 1430.0* 1299.4*    CBG:  Recent Labs Lab 03/13/15 0850 03/13/15 1302 03/13/15 1701 03/13/15 2212 03/14/15 0654  GLUCAP 146* 159* 251* 264* 204*    Recent Results (from the past 240 hour(s))  Culture, blood (Routine X 2) w Reflex to ID Panel     Status: None (Preliminary result)   Collection Time: 03/11/15  8:50 AM  Result Value Ref Range Status   Specimen Description BLOOD RIGHT ANTECUBITAL  Final   Special Requests BOTTLES DRAWN AEROBIC AND ANAEROBIC 10CC  Final   Culture NO GROWTH 2 DAYS  Final   Report Status PENDING  Incomplete  Culture, blood (Routine X 2) w Reflex to ID Panel     Status: None (Preliminary result)   Collection Time: 03/11/15  8:58 AM  Result Value Ref Range Status   Specimen Description BLOOD RIGHT HAND  Final   Special Requests BOTTLES DRAWN AEROBIC ONLY 10CC  Final   Culture NO GROWTH 2 DAYS  Final   Report Status PENDING  Incomplete  MRSA PCR Screening     Status: None   Collection Time: 03/11/15  5:16 PM  Result Value Ref Range Status   MRSA by PCR NEGATIVE NEGATIVE Final    Comment:        The GeneXpert MRSA Assay (FDA approved for NASAL specimens only), is one component of a comprehensive MRSA colonization surveillance program. It is not intended to diagnose MRSA infection nor to guide or monitor treatment for MRSA infections.       Studies/Results: US Renal  03/13/2015  CLINICAL DATA:  Acute renal failure. Chronic kidney disease, stage III. Hypertension. EXAM: RENAL / URINARY TRACT ULTRASOUND COMPLETE COMPARISON:  None. FINDINGS: Right Kidney: Length: 11.3 cm. Right renal cortex is echogenic but normal in thickness. No renal mass, cyst, stone or hydronephrosis. Left Kidney: Length: 11.4 cm. Left renal cortex is also echogenic but normal in thickness. No renal mass, cyst, stone or hydronephrosis. Bladder: Bladder wall appears mildly thickened but this is likely related to partial decompression. IMPRESSION: 1. Both kidneys appear echogenic compatible with  the given history of chronic medical renal disease. No mass, cyst, stone or hydronephrosis bilaterally. 2. Bladder walls appear mildly thickened but this is likely related to the partial bladder decompression. Electronically Signed   By: Franki Cabot M.D.   On: 03/13/2015 13:00   Dg Chest Port 1 View  03/13/2015  CLINICAL DATA:  Community acquired pneumonia with dyspnea EXAM: PORTABLE CHEST - 1 VIEW COMPARISON:  03/11/2015 FINDINGS: Cardiac shadow is stable. Increasing infiltrative densities are noted bilaterally particularly in the right upper lobe. Given the appearance on recent CT examination this likely represents progression of pulmonary edema. No bony abnormality is noted. IMPRESSION: Progressive infiltrates bilaterally but particularly  notable in the right upper lobe. Pulmonary edema is again favored. Electronically Signed   By: Inez Catalina M.D.   On: 03/13/2015 15:04    Medications:  Scheduled: . aspirin  81 mg Oral Daily  . azithromycin  500 mg Oral Q24H  . carbidopa-levodopa  1 tablet Oral TID  . cefTRIAXone (ROCEPHIN)  IV  1 g Intravenous Q24H  . citalopram  10 mg Oral Daily  . cycloSPORINE  1 drop Both Eyes BID  . diltiazem  300 mg Oral Daily  . fluticasone  2 spray Each Nare Daily  . furosemide  160 mg Intravenous Q6H  . gabapentin  300 mg Oral Daily  . guaiFENesin  1,200 mg Oral BID  . insulin aspart  0-9 Units Subcutaneous TID WC  . levalbuterol  0.63 mg Nebulization TID  . methylPREDNISolone (SOLU-MEDROL) injection  40 mg Intravenous Q12H  . metoprolol tartrate  25 mg Oral BID  . QUEtiapine  100 mg Oral QHS  . sodium chloride  3 mL Intravenous Q12H  . tamsulosin  0.4 mg Oral Daily   Continuous:   [id number] **OR** acetaminophen, alum & mag hydroxide-simeth, levalbuterol, ondansetron **OR** ondansetron (ZOFRAN) IV, senna-docusate  Assessment/Plan:  Principal Problem:   Hypoxemia requiring supplemental oxygen Active Problems:   Hepatitis C    Thrombocytopenia (HCC)   Hypertensive heart disease with CHF (HCC)   PERIPHERAL VASCULAR DISEASE   LOW BACK PAIN, CHRONIC   Chronic diastolic heart failure (HCC)   Neurologic gait disorder   PD (Parkinson's disease) (HCC)   Malignant neoplasm of prostate (HCC)   COPD (chronic obstructive pulmonary disease) (HCC)   Metabolic acidosis   CKD (chronic kidney disease) stage 4, GFR 15-29 ml/min (HCC)   PAF (paroxysmal atrial fibrillation) (HCC)   CAP (community acquired pneumonia)   Acute on chronic kidney failure (HCC)   Symptomatic anemia   Hyperglycemia    Acute on chronic respiratory failure with hypoxemia Pulmonary edema noted on chest x-ray 12/18. Significant change compared to previous films. This is most likely due to fluid overload from his kidney failure. Patient had to be placed on BiPAP yesterday. He remained stable. Also continue antibiotics for now as initial films had suggested pneumonitis.   Acute on chronic kidney failure with metabolic acidosis/hyperkalemia Renal function continues to get worse. Appreciate nephrology input. Also appreciated urology for placing Foley catheter. Patient is now on high-dose Lasix. Urine output being monitored closely. Yesterday, nephrologist spoke to the patient's wife who value did that patient may not want dialysis. But today, patient wants to think about it per nephrology note. There is no need for emergent dialysis at this time. This will be an ongoing discussion. He did have a renal ultrasound in August which revealed medical renal disease. Potassium level is normal now. He was given Kayexalate on 12/17  Anemia likely due to chronic kidney disease  Hemoglobin is low but stable. He was transfused 1 unit of blood at the time of admission. Patient is followed by nephrology as an outpatient and apparently gets marrow stimulating agents. No overt bleeding noted. Continue to monitor.  Chronic Diastolic heart failure BNP was elevated at the time of  admission but does not appear as high as it has been in the past.Initial CXR was negative for acute pulm edema. Chest x-ray from 12/18 shows diffuse pulmonary edema. Last 2D echo showed and LVEF of 65 - 70%.Continue strict ins and outs and daily weights. Mildly elevated troponins noted. Significance unclear, likely due to demand ischemia.  Patient denies any chest pain.  Hyperglycemia No documented history of diabetes.HbA1c is 6.1. Continue sliding scale coverage.  Idiopathic parkinsons with mild dementia Continue Sinemet IR.  Paroxysmal Atrial Fibrillation Currently in sinus rhythm. His episode of atrial fibrillation was back in August. CHADS2VASC his at least 4. However, patient has not been thought to be a candidate for anticoagulation due to compliance issues, thrombocytopenia. Continue Cardizem and metoprolol. Continue aspirin.  Chronic Thrombocytopenia Baseline appears to be in the 120s.Continue to monitor closely.No pharmacologic VTE prophylaxis.  DVT Prophylaxis: SCDs    Code Status: Full code. This was confirmed with his wife today. He would not want long-term life support. Family Communication: No family at bedside. Was able to speak with his wife today over the phone.  Disposition Plan: Continue management as outlined above. Continue to remain in step down unit for now.    LOS: 3 days   [facility] Pager [phone] 03/14/2015, 8:01 AM  If 7PM-7AM, please contact night-coverage at [email or web address], password [facility]

## 2015-03-14 NOTE — Progress Notes (Signed)
Inpatient Diabetes Program Recommendations  AACE/ADA: New Consensus Statement on Inpatient Glycemic Control (2015)  Target Ranges:  Prepandial:   less than 140 mg/dL      Peak postprandial:   less than 180 mg/dL (1-2 hours)      Critically ill patients:  140 - 180 mg/dL   Review of Glycemic Control  Diabetes history: No Hx dm noted Outpatient Diabetes medications: No meds noted Current orders for Inpatient glycemic control: Sensitive correction scale tidwc  Inpatient Diabetes Program Recommendations:    Please consider adding carbohydrate modified to diet orders while on Solumedrol  Thank you Rosita Kea, RN, MSN, CDE  Diabetes Inpatient Program Office: [phone] Pager: [phone] 8:00 am to 5:00 pm

## 2015-03-14 NOTE — Care Management Important Message (Signed)
Important Message  Patient Details  Name: Gerald Gomez MRN: [id number] Date of Birth: Apr 02, 1940   Medicare Important Message Given:  Yes    Nathen May 03/14/2015, 1:27 PM

## 2015-03-14 NOTE — Evaluation (Signed)
Physical Therapy Evaluation Patient Details Name: Gerald Gomez MRN: [id number] DOB: January 05, 1941 Today's Date: 03/14/2015   History of Present Illness  Gerald Gomez is a 74 y.o. male, with idiopathic parkinsonism, mild dementia, diastolic heart failure, stage IV chronic kidney disease, peripheral vascular disease, and thrombocytopenia. He presents to the emergency department with dyspnea on exertion. Per chart pt intermittently confused at baseline. Admitted with acute hypoxic respiratory failure, symptomatic anemia, acute renal failure with acidosis, probable pneumonia ? Aspiration with vomiting.   Clinical Impression  Pt admitted with above diagnosis. Pt currently with functional limitations due to the deficits listed below (see PT Problem List). Pt was able to ambulate around bed with RW but with very poor safety needing max cues and mod assist for safety.  Will need SNF for rehab prior to going home.  Will follow acutely. Pt will benefit from skilled PT to increase their independence and safety with mobility to allow discharge to the venue listed below.      Follow Up Recommendations SNF;Supervision/Assistance - 24 hour    Equipment Recommendations  Other (comment) (TBA)    Recommendations for Other Services       Precautions / Restrictions Precautions Precautions: Fall Precaution Comments: watch O2 sats Restrictions Weight Bearing Restrictions: No      Mobility  Bed Mobility Overal bed mobility: Needs Assistance Bed Mobility: Supine to Sit     Supine to sit: Min guard;HOB elevated     General bed mobility comments: VCs for sequencing and hand placement  Transfers Overall transfer level: Needs assistance Equipment used: Rolling walker (2 wheeled) Transfers: Sit to/from Stand Sit to Stand: Min assist;+2 physical assistance;From elevated surface         General transfer comment: cues needed for hand placement.  Did not stand fully upright and needing steadying  assist upon standing   Ambulation/Gait Ambulation/Gait assistance: Mod assist;Min assist;+2 physical assistance Ambulation Distance (Feet): 25 Feet Assistive device: Rolling walker (2 wheeled) Gait Pattern/deviations: Step-to pattern;Decreased stride length;Decreased step length - right;Decreased step length - left;Shuffle;Drifts right/left;Trunk flexed;Wide base of support   Gait velocity interpretation: Below normal speed for age/gender General Gait Details: Pt with very poor safety awareness with RW.  Pt pushing RW way in front of him and would not follow cues to get close to RW.  Pt states he ambulated bent over before.  Pt states he isnt much differentfrom baseline however he did not use RW prior to admit therefore had to be better than current level.  Very poor safety awareness with incr risk of falls with and without RW.    Stairs            Wheelchair Mobility    Modified Rankin (Stroke Patients Only)       Balance Overall balance assessment: Needs assistance;History of Falls Sitting-balance support: Bilateral upper extremity supported;Feet supported Sitting balance-Leahy Scale: Poor Sitting balance - Comments: Needed Ue support for balance    Standing balance support: Bilateral upper extremity supported;During functional activity Standing balance-Leahy Scale: Poor Standing balance comment: reliant on RW however not following cues to safely use RW.                              Pertinent Vitals/Pain Pain Assessment: No/denies pain  Pt on 6LO2 on arrival with sats >94%.  Pt down to 83-85% on RA.  Replace 6LO2 and sats 94-100% rest of treatment.  Other VSs.     Home Living  Family/patient expects to be discharged to:: Private residence Living Arrangements: Spouse/significant other Available Help at Discharge: Family;Available PRN/intermittently Type of Home: House Home Access: Stairs to enter Entrance Stairs-Rails: None Entrance Stairs-Number of Steps:  2 Home Layout: One level Home Equipment: Walker - 2 wheels;Shower seat;Toilet riser;Grab bars - tub/shower;Grab bars - toilet;Cane - single point Additional Comments: sleeps in recliner at home    Prior Function Level of Independence: Independent with assistive device(s)         Comments: Amb with walker or cane     Hand Dominance   Dominant Hand: Right    Extremity/Trunk Assessment   Upper Extremity Assessment: Defer to OT evaluation           Lower Extremity Assessment: Generalized weakness      Cervical / Trunk Assessment: Kyphotic  Communication   Communication: Expressive difficulties  Cognition Arousal/Alertness: Awake/alert Behavior During Therapy: WFL for tasks assessed/performed Overall Cognitive Status: History of cognitive impairments - at baseline (dereased safety awareness with RW)                      General Comments      Exercises General Exercises - Lower Extremity Ankle Circles/Pumps: AROM;Both;10 reps;Seated Long Arc Quad: AROM;Both;10 reps;Seated Hip Flexion/Marching: AROM;Both;10 reps;Seated      Assessment/Plan    PT Assessment Patient needs continued PT services  PT Diagnosis Generalized weakness   PT Problem List Decreased activity tolerance;Decreased balance;Decreased mobility;Decreased knowledge of use of DME;Decreased safety awareness;Decreased knowledge of precautions;Decreased strength  PT Treatment Interventions DME instruction;Gait training;Functional mobility training;Therapeutic activities;Therapeutic exercise;Balance training;Patient/family education   PT Goals (Current goals can be found in the Care Plan section) Acute Rehab PT Goals Patient Stated Goal: to go home PT Goal Formulation: With patient Time For Goal Achievement: 03/28/15 Potential to Achieve Goals: Good    Frequency Min 3X/week   Barriers to discharge Decreased caregiver support      Co-evaluation PT/OT/SLP Co-Evaluation/Treatment:  Yes Reason for Co-Treatment: For patient/therapist safety PT goals addressed during session: Mobility/safety with mobility         End of Session Equipment Utilized During Treatment: Gait belt;Oxygen Activity Tolerance: Patient limited by fatigue Patient left: in chair;with call bell/phone within reach;with chair alarm set Nurse Communication: Mobility status         Time: [id number] PT Time Calculation (min) (ACUTE ONLY): 22 min   Charges:   PT Evaluation $Initial PT Evaluation Tier I: 1 Procedure     PT G CodesDenice Paradise April 03, 2015, 3:57 PM [facility] Acute Rehabilitation [phone] (pager)

## 2015-03-15 ENCOUNTER — Encounter (HOSPITAL_COMMUNITY): Payer: Medicare Other

## 2015-03-15 DIAGNOSIS — J810 Acute pulmonary edema: Secondary | ICD-10-CM

## 2015-03-15 DIAGNOSIS — Z515 Encounter for palliative care: Secondary | ICD-10-CM

## 2015-03-15 LAB — RENAL FUNCTION PANEL
ALBUMIN: 2.2 g/dL — AB (ref 3.5–5.0)
ANION GAP: 16 — AB (ref 5–15)
BUN: 171 mg/dL — ABNORMAL HIGH (ref 6–20)
CALCIUM: 7.4 mg/dL — AB (ref 8.9–10.3)
CO2: 19 mmol/L — AB (ref 22–32)
Chloride: 103 mmol/L (ref 101–111)
Creatinine, Ser: 6.43 mg/dL — ABNORMAL HIGH (ref 0.61–1.24)
GFR calc non Af Amer: 8 mL/min — ABNORMAL LOW (ref >60)
GFR, EST AFRICAN AMERICAN: 9 mL/min — AB (ref >60)
Glucose, Bld: 251 mg/dL — ABNORMAL HIGH (ref 65–99)
PHOSPHORUS: 10.1 mg/dL — AB (ref 2.5–4.6)
Potassium: 4.1 mmol/L (ref 3.5–5.1)
SODIUM: 138 mmol/L (ref 135–145)

## 2015-03-15 LAB — GLUCOSE, CAPILLARY
GLUCOSE-CAPILLARY: 231 mg/dL — AB (ref 65–99)
Glucose-Capillary: 175 mg/dL — ABNORMAL HIGH (ref 65–99)
Glucose-Capillary: 287 mg/dL — ABNORMAL HIGH (ref 65–99)
Glucose-Capillary: 310 mg/dL — ABNORMAL HIGH (ref 65–99)

## 2015-03-15 LAB — CBC
HCT: 22.5 % — ABNORMAL LOW (ref 39.0–52.0)
HEMOGLOBIN: 7.2 g/dL — AB (ref 13.0–17.0)
MCH: 27.7 pg (ref 26.0–34.0)
MCHC: 32 g/dL (ref 30.0–36.0)
MCV: 86.5 fL (ref 78.0–100.0)
Platelets: 109 10*3/uL — ABNORMAL LOW (ref 150–400)
RBC: 2.6 MIL/uL — AB (ref 4.22–5.81)
RDW: 15.4 % (ref 11.5–15.5)
WBC: 7.4 10*3/uL (ref 4.0–10.5)

## 2015-03-15 MED ORDER — METHYLPREDNISOLONE SODIUM SUCC 40 MG IJ SOLR
40.0000 mg | INTRAMUSCULAR | Status: AC
  Administered 2015-03-16: 40 mg via INTRAVENOUS
  Filled 2015-03-15: qty 1

## 2015-03-15 MED ORDER — INSULIN ASPART 100 UNIT/ML ~~LOC~~ SOLN
5.0000 [IU] | Freq: Once | SUBCUTANEOUS | Status: AC
  Administered 2015-03-15: 5 [IU] via SUBCUTANEOUS

## 2015-03-15 NOTE — Consult Note (Signed)
Consultation Note Date: 03/15/2015   Patient Name: Gerald Gomez  DOB: September 17, 1940  MRN: [id number]  Age / Sex: 74 y.o., male  PCP: [name], MD Referring Physician: Bonnielee Haff, MD  Reason for Consultation: Establishing goals of care    Clinical Assessment/Narrative: I met today with Mr. Cutler and his wife, [name]. Mr. Goldner is hesitant to discuss his options and his declining health but does acknowledge that this needs to be done. [name] encourages him to talk with [place] so that we know his wishes. [name] acknowledges that she has heard from other providers that dialysis is not a good option for him but wants for him to make his own decisions, however, the only thing Mr. Stickles does tell us is that he wants to live. We discuss that issues with dialysis and how taxing it can be on his body and the commitment required for dialysis as well as the fact that dialysis would not improve QOL. Mr. Yost seems understanding of what it means to go through dialysis. Especially since [name] says he has gone from using a cane, then walker, and now a wheelchair mostly when he gets out of the house for doctors visits. However he tells [place] that he saw Dr. [name] recently and she told him he didn't need dialysis. I explained that we are trying everything short of dialysis to recover his renal function but that so far this is not working - explained that we do not put people through dialysis unless there aren't other options. They struggle with the fact that with worsening renal function and without dialysis there are not really any other options but to focus on comfort if medical management in unsuccessful. We discussed this scenario of renal failure and no dialysis and comfort option and that time could be very limited - I did not explain specific time frame as they did not ask and they already seem overwhelmed with the  options (or really lack thereof).   Mr. Humphrey is unable to articulate if he would desire dialysis or not IF this is even offered which seems like it is not. [name] wants for him to make his own decisions. I did stress that there might come a time when he is unable to make his own decisions especially with continued decline. Mr. Birchard did say that he wants to go home (does have h/o leaving AMA) - explained if he still wants treatment (as he is telling me) this will not be a good idea. He already seems somewhat frustrated with the medical system and I agree that I would not recommend dialysis for him. They wish to continue medical management and he is hopeful for some improvement with these interventions even though I tried to reiterate that unfortunately his renal function continues to decline even with our best efforts. I will meet again with Mr. Applegate and [name] tomorrow at noon and we will reassess a plan for him based on his progress over the next 24 hours. I did not have a chance to address code status today but will discuss this tomorrow.   Contacts/Participants in Discussion: Primary Decision Maker: Mr. Lamar and wife [name]    SUMMARY OF RECOMMENDATIONS - Difficult for Mr. Crookshanks to make any decisions or explain his goals for himself (unsure how much he truly understands) - Today I worked to try and build rapport and prepare them for a path of comfort with worsening renal function - Remeet tomorrow noon  Code Status/Advance Care Planning: Full  code    Code Status Orders        Start     Ordered   03/11/15 1526  Full code   Continuous     03/11/15 1526     PPS: 30%  Symptom Management:   Chronic back pain: Denies pain currently. Gabapentin stopped and this may influence his pain. High risk for delirium so would utilize opioids judiciously unless transition to comfort care. Could consider hydrocodone-acetaminophen 5/325 mg if needed.   Palliative Prophylaxis:    Delirium Protocol, Frequent Pain Assessment and Oral Care  Additional Recommendations (Limitations, Scope, Preferences):  Full Scope Treatment for now  Psycho-social/Spiritual:  Support System: Adequate Desire for further Chaplaincy support: no Additional Recommendations: Caregiving  Support/Resources, Education on Hospice and Grief/Bereavement Support  Prognosis: Weeks or less with worsening renal failure - did not share with pt/family.   Discharge Planning: To be determined.    Chief Complaint/ Primary Diagnoses: Present on Admission:  . CAP (community acquired pneumonia) . Acute on chronic kidney failure ([place]) . Symptomatic anemia . PERIPHERAL VASCULAR DISEASE . PD (Parkinson's disease) ([place]) . PAF (paroxysmal atrial fibrillation) ([place]) . Metabolic acidosis . Malignant neoplasm of prostate ([place]) . LOW BACK PAIN, CHRONIC . Hypertensive heart disease with CHF ([place]) . Hepatitis C . CKD (chronic kidney disease) stage 4, GFR 15-29 ml/min (HCC) . Thrombocytopenia ([place]) . Hypoxemia requiring supplemental oxygen . Hyperglycemia . COPD (chronic obstructive pulmonary disease) ([place]) . Chronic diastolic heart failure ([place])  I have reviewed the medical record, interviewed the patient and family, and examined the patient. The following aspects are pertinent.  Past Medical History  Diagnosis Date  . ALLERGIC RHINITIS 03/28/2009  . ANXIETY 03/28/2009  . CHOLELITHIASIS 03/28/2009  . DEPRESSION 03/28/2009  . DIVERTICULOSIS, COLON 03/28/2009  . 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 FATIGUE 03/28/2009  . HYPERLIPIDEMIA 03/28/2009  . HYPERTENSION 03/28/2009  . LOW BACK PAIN, CHRONIC 03/28/2009  . PERIPHERAL VASCULAR DISEASE 03/28/2009  . Personal history of alcoholism ([place]) 03/28/2009  . THROMBOCYTOPENIA 03/28/2009  . Wheezing 02/07/2010  . CHF (congestive heart failure) ([place])   . Impaired glucose tolerance 12/01/2013  . Heart murmur   . Exertional dyspnea 11/04/2014  . History of stomach ulcers    . HEPATITIS C 03/28/2009  . DEGENERATIVE JOINT DISEASE 03/28/2009  . [place] DISEASE, LUMBAR 03/28/2009  . Arthritis     "everywhere"  . Chronic kidney disease, stage 3     /notes 11/04/2014  . Chronic anemia     [name] 11/04/2014  . Thrombocytopenia ([place])     chronic/notes 11/04/2014  . Malignant neoplasm prostate ([place])     "in treatment for it now" (11/04/2014)  . Pneumonia     Sepsis- suspecting /notes 11/04/2014   Social History   Social History  . Marital Status: Married    Spouse Name: N/A  . Number of Children: N/A  . Years of Education: N/A   Occupational History  .      truck driver   Social History Main Topics  . Smoking status: Former Smoker -- 1.00 packs/day for 41 years    Types: Cigarettes    Quit date: 03/27/1995  . Smokeless tobacco: Never Used  . Alcohol Use: No     Comment: quit drinking 1997  . Drug Use: Yes    Special: Marijuana     Comment: "quit smoking marijuana before 1997"  . Sexual Activity:    Partners: Female   Other Topics Concern  . 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 Lung cancer Father   . Stroke Brother   . Breast cancer Daughter    Scheduled Meds: . aspirin  81 mg Oral Daily  . azithromycin  500 mg Oral Q24H  . carbidopa-levodopa  1 tablet Oral TID  . cefTRIAXone (ROCEPHIN)  IV  1 g Intravenous Q24H  . citalopram  10 mg Oral Daily  . cycloSPORINE  1 drop Both Eyes BID  . diltiazem  300 mg Oral Daily  . fluticasone  2 spray Each Nare Daily  . furosemide  160 mg Intravenous Q6H  . guaiFENesin  1,200 mg Oral BID  . insulin aspart  0-9 Units Subcutaneous TID WC  . levalbuterol  0.63 mg Nebulization TID  . [START ON 03/16/2015] methylPREDNISolone (SOLU-MEDROL) injection  40 mg Intravenous Q24H  . metoprolol tartrate  25 mg Oral BID  . QUEtiapine  100 mg Oral QHS  .  sodium bicarbonate  650 mg Oral TID  . sodium chloride  3 mL Intravenous Q12H  . tamsulosin  0.4 mg Oral Daily   Continuous Infusions:  PRN Meds:.acetaminophen **OR** acetaminophen, alum & mag hydroxide-simeth, levalbuterol, ondansetron **OR** ondansetron (ZOFRAN) IV, senna-docusate Medications Prior to Admission:  Prior to Admission medications   Medication Sig Start Date End Date Taking? Authorizing Provider  albuterol (PROVENTIL HFA;VENTOLIN HFA) 108 (90 BASE) MCG/ACT inhaler Inhale 2 puffs into the lungs every 6 (six) hours as needed for wheezing or shortness of breath. Dispense Ventolin HFA please 06/01/14  Yes [name], MD  aspirin 81 MG chewable tablet Chew 1 tablet (81 mg total) by mouth daily. 11/24/14  Yes [name], MD  carbidopa-levodopa (SINEMET IR) 25-100 MG per tablet Take 1 tablet by mouth 3 (three) times daily. 07/02/14  Yes Rebecca S Tat, DO  citalopram (CELEXA) 10 MG tablet Take 1 tablet (10 mg total) by mouth daily. 02/21/15  Yes [name], MD  cycloSPORINE (RESTASIS) 0.05 % ophthalmic emulsion Place 1 drop into both eyes 2 (two) times daily.   Yes Historical Provider, MD  diltiazem (CARDIZEM CD) 300 MG 24 hr capsule Take 1 capsule (300 mg total) by mouth daily. 12/07/14  Yes [name], MD  fluticasone (FLONASE) 50 MCG/ACT nasal spray Place 2 sprays into both nostrils daily.   Yes Historical Provider, MD  furosemide (LASIX) 40 MG tablet Take 1 tablet (40 mg total) by mouth every morning. 12/07/14  Yes [name], MD  gabapentin (NEURONTIN) 100 MG capsule Take 1 capsule (100 mg total) by mouth 3 (three) times daily. Patient taking differently: Take 200 mg by mouth 3 (three) times daily.  09/09/14  Yes [name], MD  ibuprofen (ADVIL,MOTRIN) 800 MG tablet take 1 tablet by mouth twice a day if needed for pain 01/05/15  Yes [name], MD  metoprolol tartrate (LOPRESSOR) 25 MG tablet take 1 tablet by mouth twice a day 07/21/14  Yes [name], MD  QUEtiapine  (SEROQUEL) 100 MG tablet Take 1 tablet (100 mg total) by mouth at bedtime. 12/09/14  Yes [name], MD  tamsulosin (FLOMAX) 0.4 MG CAPS capsule Take 1 capsule (0.4 mg total) by mouth daily. 02/03/15  Yes [name], MD   Allergies  Allergen Reactions  . Tramadol  Nausea Only    Review of Systems  Constitutional: Positive for activity change. Negative for appetite change.  Respiratory: Positive for shortness of breath.   Cardiovascular: Positive for leg swelling.  Neurological: Positive for weakness.    Physical Exam  Constitutional: He appears well-developed and well-nourished.  HENT:  Head: Normocephalic and atraumatic.  Cardiovascular: Normal rate.   Respiratory: Effort normal. No accessory muscle usage. No tachypnea. No respiratory distress.  GI: Normal appearance.  Neurological: He is alert.  Oriented to person, place, and somewhat to situation. Appears to mask any confusion with humor and disinterest.     Vital Signs: BP 146/79 mmHg  Pulse 134  Temp(Src) 97.9 F (36.6 C) (Oral)  Resp 13  Ht 6' 0.5" (1.842 m)  Wt 106.2 kg (234 lb 2.1 oz)  BMI 31.30 kg/m2  SpO2 100%  SpO2: SpO2: 100 % O2 Device:SpO2: 100 % O2 Flow Rate: .O2 Flow Rate (L/min): 4 L/min  IO: Intake/output summary:  Intake/Output Summary (Last 24 hours) at 03/15/15 1329 Last data filed at 03/15/15 0600  Gross per 24 hour  Intake    290 ml  Output    600 ml  Net   -310 ml    LBM: Last BM Date: 03/10/15 Baseline Weight: Weight: 97.268 kg (214 lb 7 oz) Most recent weight: Weight: 106.2 kg (234 lb 2.1 oz)      Palliative Assessment/Data:    Additional Data Reviewed:  CBC:    Component Value Date/Time   WBC 7.4 03/15/2015 0240   WBC 5.1 11/23/2008 1045   HGB 7.2* 03/15/2015 0240   HGB 14.4 11/23/2008 1045   HCT 22.5* 03/15/2015 0240   HCT 43.1 11/23/2008 1045   PLT 109* 03/15/2015 0240   PLT 73* 11/23/2008 1045   MCV 86.5 03/15/2015 0240   MCV 85.3 11/23/2008 1045   NEUTROABS 7.9*  03/11/2015 0712   NEUTROABS 1.9 11/23/2008 1045   LYMPHSABS 0.4* 03/11/2015 0712   LYMPHSABS 2.4 11/23/2008 1045   MONOABS 1.1* 03/11/2015 0712   MONOABS 0.6 11/23/2008 1045   EOSABS 0.0 03/11/2015 0712   EOSABS 0.2 11/23/2008 1045   BASOSABS 0.0 03/11/2015 0712   BASOSABS 0.0 11/23/2008 1045   Comprehensive Metabolic Panel:    Component Value Date/Time   NA 138 03/15/2015 0240   K 4.1 03/15/2015 0240   CL 103 03/15/2015 0240   CO2 19* 03/15/2015 0240   BUN 171* 03/15/2015 0240   CREATININE 6.43* 03/15/2015 0240   GLUCOSE 251* 03/15/2015 0240   CALCIUM 7.4* 03/15/2015 0240   AST 36 03/12/2015 0037   ALT 6* 03/12/2015 0037   ALKPHOS 68 03/12/2015 0037   BILITOT 0.5 03/12/2015 0037   PROT 7.4 03/12/2015 0037   ALBUMIN 2.2* 03/15/2015 0240     Time In: 1155 Time Out: 1310 Time Total: 47mn Greater than 50%  of this time was spent counseling and coordinating care related to the above assessment and plan.  Signed by: PPershing Proud NP  APershing Proud NP  160/63/0160 1:29 PM  Please contact Palliative Medicine Team phone at [phone] questions and concerns.

## 2015-03-15 NOTE — Progress Notes (Signed)
S: Says he ate well yest.  Denies N/V O:BP 117/70 mmHg  Pulse 103  Temp(Src) 97.4 F (36.3 C) (Axillary)  Resp 11  Ht 6' 0.5" (1.842 m)  Wt 106.2 kg (234 lb 2.1 oz)  BMI 31.30 kg/m2  SpO2 100%  Intake/Output Summary (Last 24 hours) at 03/15/15 0755 Last data filed at 03/15/15 0600  Gross per 24 hour  Intake    920 ml  Output    850 ml  Net     70 ml   Weight change: 3.7 kg (8 lb 2.5 oz) ZWC:HENID and alert POE:UMPNT. irreg Resp: Bilat crackles Rt>LT Abd:+ BS NTND IRW:ERXVQ edema NEURO:CNI OX3 + asterixis  Ox2  Thinks he is 74yo, does not know the year Foley cath  in place   . aspirin  81 mg Oral Daily  . azithromycin  500 mg Oral Q24H  . carbidopa-levodopa  1 tablet Oral TID  . cefTRIAXone (ROCEPHIN)  IV  1 g Intravenous Q24H  . citalopram  10 mg Oral Daily  . cycloSPORINE  1 drop Both Eyes BID  . diltiazem  300 mg Oral Daily  . fluticasone  2 spray Each Nare Daily  . furosemide  160 mg Intravenous Q6H  . gabapentin  300 mg Oral Daily  . guaiFENesin  1,200 mg Oral BID  . insulin aspart  0-9 Units Subcutaneous TID WC  . levalbuterol  0.63 mg Nebulization TID  . methylPREDNISolone (SOLU-MEDROL) injection  40 mg Intravenous Q12H  . metoprolol tartrate  25 mg Oral BID  . QUEtiapine  100 mg Oral QHS  . sodium bicarbonate  650 mg Oral TID  . sodium chloride  3 mL Intravenous Q12H  . tamsulosin  0.4 mg Oral Daily   US Renal  03/13/2015  CLINICAL DATA:  Acute renal failure. Chronic kidney disease, stage III. Hypertension. EXAM: RENAL / URINARY TRACT ULTRASOUND COMPLETE COMPARISON:  None. FINDINGS: Right Kidney: Length: 11.3 cm. Right renal cortex is echogenic but normal in thickness. No renal mass, cyst, stone or hydronephrosis. Left Kidney: Length: 11.4 cm. Left renal cortex is also echogenic but normal in thickness. No renal mass, cyst, stone or hydronephrosis. Bladder: Bladder wall appears mildly thickened but this is likely related to partial decompression. IMPRESSION:  1. Both kidneys appear echogenic compatible with the given history of chronic medical renal disease. No mass, cyst, stone or hydronephrosis bilaterally. 2. Bladder walls appear mildly thickened but this is likely related to the partial bladder decompression. Electronically Signed   By: Franki Cabot M.D.   On: 03/13/2015 13:00   Dg Chest Port 1 View  03/13/2015  CLINICAL DATA:  Community acquired pneumonia with dyspnea EXAM: PORTABLE CHEST - 1 VIEW COMPARISON:  03/11/2015 FINDINGS: Cardiac shadow is stable. Increasing infiltrative densities are noted bilaterally particularly in the right upper lobe. Given the appearance on recent CT examination this likely represents progression of pulmonary edema. No bony abnormality is noted. IMPRESSION: Progressive infiltrates bilaterally but particularly notable in the right upper lobe. Pulmonary edema is again favored. Electronically Signed   By: Inez Catalina M.D.   On: 03/13/2015 15:04   BMET    Component Value Date/Time   NA 138 03/15/2015 0240   K 4.1 03/15/2015 0240   CL 103 03/15/2015 0240   CO2 19* 03/15/2015 0240   GLUCOSE 251* 03/15/2015 0240   BUN 171* 03/15/2015 0240   CREATININE 6.43* 03/15/2015 0240   CALCIUM 7.4* 03/15/2015 0240   GFRNONAA 8* 03/15/2015 0240   GFRAA 9*  03/15/2015 0240   CBC    Component Value Date/Time   WBC 7.4 03/15/2015 0240   WBC 5.1 11/23/2008 1045   RBC 2.60* 03/15/2015 0240   RBC 5.05 11/23/2008 1045   HGB 7.2* 03/15/2015 0240   HGB 14.4 11/23/2008 1045   HCT 22.5* 03/15/2015 0240   HCT 43.1 11/23/2008 1045   PLT 109* 03/15/2015 0240   PLT 73* 11/23/2008 1045   MCV 86.5 03/15/2015 0240   MCV 85.3 11/23/2008 1045   MCH 27.7 03/15/2015 0240   MCH 28.5 11/23/2008 1045   MCHC 32.0 03/15/2015 0240   MCHC 33.4 11/23/2008 1045   RDW 15.4 03/15/2015 0240   RDW 13.2 11/23/2008 1045   LYMPHSABS 0.4* 03/11/2015 0712   LYMPHSABS 2.4 11/23/2008 1045   MONOABS 1.1* 03/11/2015 0712   MONOABS 0.6 11/23/2008 1045    EOSABS 0.0 03/11/2015 0712   EOSABS 0.2 11/23/2008 1045   BASOSABS 0.0 03/11/2015 0712   BASOSABS 0.0 11/23/2008 1045     Assessment: 1. Acute on CKD 4.  Scr higher, UO seems to be improving? 2. PNA vs Pulm edema 3. A fib 4. Met acidosis 5. Anemia and iron def, on procrit as outpt.  If improves, then can give procrit and IV iron  Plan: 1. I see that Dr Barkley Bruns note that states no plans for HD after discussion with wife.  I think we should tx what we can medically and see if will recover.  Discussed with Dr [name] having palliative care see pt. 2. Cont laisx 3. DC solumedrol 4. Daily labs 5. Dc gabapentin for now  Kendall Justo T

## 2015-03-15 NOTE — Progress Notes (Signed)
[facility] PROGRESS NOTE  Gerald Gomez [id number] DOB: [birth year] DOA: 03/11/2015  PCP: [name], MD  Brief HPI: 74 year old African-American male with past medical history of idiopathic parkinsonism, mild dementia, diastolic heart failure, stage IV chronic kidney disease, thrombocytopenia, presented with complaints of shortness of breath. He was admitted to the hospital for possible community-acquired pneumonia. Also noted to have acute on chronic renal failure. Renal failure continued to get worse. Patient developed pulmonary edema requiring BiPAP. Patient thought to be a poor candidate for dialysis and had previously expressed a wish not to be dialyzed per his wife. In view of this palliative medicine was consulted.  Past medical history:  Past Medical History  Diagnosis Date  . ALLERGIC RHINITIS 03/28/2009  . ANXIETY 03/28/2009  . CHOLELITHIASIS 03/28/2009  . DEPRESSION 03/28/2009  . DIVERTICULOSIS, COLON 03/28/2009  . 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 FATIGUE 03/28/2009  . HYPERLIPIDEMIA 03/28/2009  . HYPERTENSION 03/28/2009  . LOW BACK PAIN, CHRONIC 03/28/2009  . PERIPHERAL VASCULAR DISEASE 03/28/2009  . Personal history of alcoholism ([place]) 03/28/2009  . THROMBOCYTOPENIA 03/28/2009  . Wheezing 02/07/2010  . CHF (congestive heart failure) ([place])   . Impaired glucose tolerance 12/01/2013  . Heart murmur   . Exertional dyspnea 11/04/2014  . History of stomach ulcers   . HEPATITIS C 03/28/2009  . DEGENERATIVE JOINT DISEASE 03/28/2009  . [place] DISEASE, LUMBAR 03/28/2009  . Arthritis     "everywhere"  . Chronic kidney disease, stage 3     /notes 11/04/2014  . Chronic anemia     [name] 11/04/2014  . Thrombocytopenia ([place])     chronic/notes 11/04/2014  . Malignant neoplasm prostate ([place])     "in treatment for it now" (11/04/2014)  . Pneumonia     Sepsis- suspecting [name] 11/04/2014    Consultants: Nephrology. Palliative medicine  Procedures: None  Antibiotics: Ceftriaxone and  azithromycin 12/16  Subjective: Patient sleepy this morning. Easily arousable. Denies any pain. Denies any difficulty breathing.   Objective: Vital Signs  Filed Vitals:   03/15/15 0000 03/15/15 0406 03/15/15 0500 03/15/15 0728  BP: 126/80 117/70    Pulse: 127 103    Temp: 97.3 F (36.3 C) 97.4 F (36.3 C)    TempSrc: Axillary Axillary    Resp: 12 11    Height:      Weight:   106.2 kg (234 lb 2.1 oz)   SpO2: 100% 100%  100%    Intake/Output Summary (Last 24 hours) at 03/15/15 0748 Last data filed at 03/15/15 0600  Gross per 24 hour  Intake    920 ml  Output    850 ml  Net     70 ml   Filed Weights   03/13/15 0418 03/14/15 0359 03/15/15 0500  Weight: 100.4 kg (221 lb 5.5 oz) 102.5 kg (225 lb 15.5 oz) 106.2 kg (234 lb 2.1 oz)    General appearance:  cooperative, appears stated age and no distress Resp: Continues to have crackles at the bases. No rhonchi. No wheezing. Cardio: S1, S2 normal regular , no murmur, click, rub or gallop GI: soft, non-tender; bowel sounds normal; no masses,  no organomegaly Extremities: extremities normal, atraumatic, no cyanosis or edema Neurologic: Sleepy but arousable. Moving all of his extremities. No facial asymmetry.  Lab Results:  Basic Metabolic Panel:  Recent Labs Lab 03/11/15 0712 03/12/15 0037 03/13/15 0321 03/14/15 0256 03/15/15 0240  NA 140 139 138 135 138  K 4.4 5.2* 4.7 4.0 4.1  CL 115* 115* 108  102 103  CO2 14* 15* 17* 16* 19*  GLUCOSE 291* 152* 149* 289* 251*  BUN 83* 86* 119* 147* 171*  CREATININE 3.49* 3.91* 5.08* 5.89* 6.43*  CALCIUM 8.9 8.9 8.3* 7.7* 7.4*  PHOS  --   --   --   --  10.1*   Liver Function Tests:  Recent Labs Lab 03/11/15 0712 03/12/15 0037 03/15/15 0240  AST 42* 36  --   ALT 12* 6*  --   ALKPHOS 75 68  --   BILITOT 0.2* 0.5  --   PROT 7.3 7.4  --   ALBUMIN 2.7* 2.6* 2.2*   CBC:  Recent Labs Lab 03/11/15 0712 03/12/15 0037 03/13/15 0321 03/14/15 0256 03/15/15 0240  WBC 9.5  13.1* 11.2* 10.1 7.4  NEUTROABS 7.9*  --   --   --   --   HGB 7.2* 7.9* 7.5* 7.6* 7.2*  HCT 23.3* 25.6* 23.8* 24.5* 22.5*  MCV 89.6 89.2 87.5 87.2 86.5  PLT 90* 91* 103* 114* 109*   Cardiac Enzymes:  Recent Labs Lab 03/11/15 0712 03/11/15 1845 03/12/15 0037 03/13/15 0947  CKTOTAL  --   --   --  2147*  TROPONINI 0.05* 0.08* 0.06*  --    BNP (last 3 results)  Recent Labs  11/14/14 1601 11/17/14 0636 03/11/15 0712  BNP 2334.6* 1430.0* 1299.4*   CBG:  Recent Labs Lab 03/13/15 2212 03/14/15 0654 03/14/15 1208 03/14/15 1735 03/14/15 2132  GLUCAP 264* 204* 244* 282* 313*    Recent Results (from the past 240 hour(s))  Culture, blood (Routine X 2) w Reflex to ID Panel     Status: None (Preliminary result)   Collection Time: 03/11/15  8:50 AM  Result Value Ref Range Status   Specimen Description BLOOD RIGHT ANTECUBITAL  Final   Special Requests BOTTLES DRAWN AEROBIC AND ANAEROBIC 10CC  Final   Culture NO GROWTH 3 DAYS  Final   Report Status PENDING  Incomplete  Culture, blood (Routine X 2) w Reflex to ID Panel     Status: None (Preliminary result)   Collection Time: 03/11/15  8:58 AM  Result Value Ref Range Status   Specimen Description BLOOD RIGHT HAND  Final   Special Requests BOTTLES DRAWN AEROBIC ONLY 10CC  Final   Culture NO GROWTH 3 DAYS  Final   Report Status PENDING  Incomplete  MRSA PCR Screening     Status: None   Collection Time: 03/11/15  5:16 PM  Result Value Ref Range Status   MRSA by PCR NEGATIVE NEGATIVE Final    Comment:        The GeneXpert MRSA Assay (FDA approved for NASAL specimens only), is one component of a comprehensive MRSA colonization surveillance program. It is not intended to diagnose MRSA infection nor to guide or monitor treatment for MRSA infections.       Studies/Results: US Renal  03/13/2015  CLINICAL DATA:  Acute renal failure. Chronic kidney disease, stage III. Hypertension. EXAM: RENAL / URINARY TRACT ULTRASOUND  COMPLETE COMPARISON:  None. FINDINGS: Right Kidney: Length: 11.3 cm. Right renal cortex is echogenic but normal in thickness. No renal mass, cyst, stone or hydronephrosis. Left Kidney: Length: 11.4 cm. Left renal cortex is also echogenic but normal in thickness. No renal mass, cyst, stone or hydronephrosis. Bladder: Bladder wall appears mildly thickened but this is likely related to partial decompression. IMPRESSION: 1. Both kidneys appear echogenic compatible with the given history of chronic medical renal disease. No mass, cyst, stone or hydronephrosis  bilaterally. 2. Bladder walls appear mildly thickened but this is likely related to the partial bladder decompression. Electronically Signed   By: Franki Cabot M.D.   On: 03/13/2015 13:00   Dg Chest Port 1 View  03/13/2015  CLINICAL DATA:  Community acquired pneumonia with dyspnea EXAM: PORTABLE CHEST - 1 VIEW COMPARISON:  03/11/2015 FINDINGS: Cardiac shadow is stable. Increasing infiltrative densities are noted bilaterally particularly in the right upper lobe. Given the appearance on recent CT examination this likely represents progression of pulmonary edema. No bony abnormality is noted. IMPRESSION: Progressive infiltrates bilaterally but particularly notable in the right upper lobe. Pulmonary edema is again favored. Electronically Signed   By: Inez Catalina M.D.   On: 03/13/2015 15:04    Medications:  Scheduled: . aspirin  81 mg Oral Daily  . azithromycin  500 mg Oral Q24H  . carbidopa-levodopa  1 tablet Oral TID  . cefTRIAXone (ROCEPHIN)  IV  1 g Intravenous Q24H  . citalopram  10 mg Oral Daily  . cycloSPORINE  1 drop Both Eyes BID  . diltiazem  300 mg Oral Daily  . fluticasone  2 spray Each Nare Daily  . furosemide  160 mg Intravenous Q6H  . gabapentin  300 mg Oral Daily  . guaiFENesin  1,200 mg Oral BID  . insulin aspart  0-9 Units Subcutaneous TID WC  . levalbuterol  0.63 mg Nebulization TID  . methylPREDNISolone (SOLU-MEDROL) injection   40 mg Intravenous Q12H  . metoprolol tartrate  25 mg Oral BID  . QUEtiapine  100 mg Oral QHS  . sodium bicarbonate  650 mg Oral TID  . sodium chloride  3 mL Intravenous Q12H  . tamsulosin  0.4 mg Oral Daily   Continuous:   [id number] **OR** acetaminophen, alum & mag hydroxide-simeth, levalbuterol, ondansetron **OR** ondansetron (ZOFRAN) IV, senna-docusate  Assessment/Plan:  Principal Problem:   Hypoxemia requiring supplemental oxygen Active Problems:   Hepatitis C   Thrombocytopenia (HCC)   Hypertensive heart disease with CHF (HCC)   PERIPHERAL VASCULAR DISEASE   LOW BACK PAIN, CHRONIC   Chronic diastolic heart failure (HCC)   Neurologic gait disorder   PD (Parkinson's disease) ([place])   Malignant neoplasm of prostate (HCC)   COPD (chronic obstructive pulmonary disease) (HCC)   Metabolic acidosis   CKD (chronic kidney disease) stage 4, GFR 15-29 ml/min (HCC)   PAF (paroxysmal atrial fibrillation) (HCC)   CAP (community acquired pneumonia)   Acute on chronic kidney failure (HCC)   Symptomatic anemia   Hyperglycemia    Acute on chronic respiratory failure with hypoxemia Pulmonary edema noted on chest x-ray 12/18. Significant change compared to previous films. This is most likely due to fluid overload from his kidney failure. Patient had to be placed on BiPAP. He he has been stable. Also continue antibiotics for now as initial films had suggested pneumonitis. Likely discontinue after 7 days of treatment.  Acute on chronic kidney failure with metabolic acidosis/hyperkalemia Renal function continues to get worse. Nephrology is following. Also appreciated urology for placing Foley catheter. Patient is now on high-dose Lasix. Urine output being monitored closely. Patient is considered to be a poor candidate for dialysis. Discussed with his wife who also mentioned that patient had been reluctant to go on dialysis in the past. So at this point in time, we will proceed with  palliative medicine input for goals of care. Wife is agreeable to same. There is no need for emergent dialysis at this time. Renal ultrasound as above. No hydronephrosis  noted. Potassium level is normal now. He was given Kayexalate on 12/17  Anemia likely due to chronic kidney disease  Hemoglobin is low but stable. He was transfused 1 unit of blood at the time of admission. Patient is followed by nephrology as an outpatient and apparently gets marrow stimulating agents. No overt bleeding noted. Continue to monitor.  Chronic Diastolic heart failure Initial CXR was negative for acute pulm edema. Chest x-ray from 12/18 shows diffuse pulmonary edema. Last 2D echo showed and LVEF of 65 - 70%.Continue strict ins and outs and daily weights. Mildly elevated troponins noted. Significance unclear, likely due to demand ischemia. Patient denies any chest pain.  Hyperglycemia No documented history of diabetes.HbA1c is 6.1. Continue sliding scale coverage.  Idiopathic parkinsons with mild dementia Continue Sinemet IR.  Paroxysmal Atrial Fibrillation Currently in sinus rhythm. His episode of atrial fibrillation was back in August. CHADS2VASC his at least 4. However, patient has not been thought to be a candidate for anticoagulation due to compliance issues, thrombocytopenia. Continue Cardizem and metoprolol. Continue aspirin.  Chronic Thrombocytopenia Baseline appears to be in the 120s.Continue to monitor closely.No pharmacologic VTE prophylaxis.  DVT Prophylaxis: SCDs    Code Status: Full code for now. Await palliative medicine input. Family Communication: No family at bedside. Discussed with his wife this morning.  Disposition Plan: Continue management as outlined above. Continue to remain in step down unit for now. Await palliative medicine consultation.    LOS: 4 days   [facility] Pager [phone] 03/15/2015, 7:48 AM  If 7PM-7AM, please contact night-coverage at  [email or web address], password [facility]

## 2015-03-16 ENCOUNTER — Ambulatory Visit: Payer: Medicare Other | Admitting: Internal Medicine

## 2015-03-16 LAB — CULTURE, BLOOD (ROUTINE X 2)
CULTURE: NO GROWTH
Culture: NO GROWTH

## 2015-03-16 LAB — GLUCOSE, CAPILLARY
GLUCOSE-CAPILLARY: 200 mg/dL — AB (ref 65–99)
GLUCOSE-CAPILLARY: 257 mg/dL — AB (ref 65–99)
GLUCOSE-CAPILLARY: 313 mg/dL — AB (ref 65–99)
Glucose-Capillary: 325 mg/dL — ABNORMAL HIGH (ref 65–99)

## 2015-03-16 LAB — RENAL FUNCTION PANEL
ANION GAP: 20 — AB (ref 5–15)
Albumin: 2.1 g/dL — ABNORMAL LOW (ref 3.5–5.0)
BUN: 203 mg/dL — ABNORMAL HIGH (ref 6–20)
CALCIUM: 7.1 mg/dL — AB (ref 8.9–10.3)
CHLORIDE: 101 mmol/L (ref 101–111)
CO2: 16 mmol/L — AB (ref 22–32)
Creatinine, Ser: 6.81 mg/dL — ABNORMAL HIGH (ref 0.61–1.24)
GFR, EST AFRICAN AMERICAN: 8 mL/min — AB (ref >60)
GFR, EST NON AFRICAN AMERICAN: 7 mL/min — AB (ref >60)
Glucose, Bld: 273 mg/dL — ABNORMAL HIGH (ref 65–99)
Phosphorus: 11.3 mg/dL — ABNORMAL HIGH (ref 2.5–4.6)
Potassium: 4.4 mmol/L (ref 3.5–5.1)
SODIUM: 137 mmol/L (ref 135–145)

## 2015-03-16 LAB — CBC
HCT: 21.1 % — ABNORMAL LOW (ref 39.0–52.0)
Hemoglobin: 6.7 g/dL — CL (ref 13.0–17.0)
MCH: 27.2 pg (ref 26.0–34.0)
MCHC: 31.8 g/dL (ref 30.0–36.0)
MCV: 85.8 fL (ref 78.0–100.0)
Platelets: 122 K/uL — ABNORMAL LOW (ref 150–400)
RBC: 2.46 MIL/uL — ABNORMAL LOW (ref 4.22–5.81)
RDW: 14.8 % (ref 11.5–15.5)
WBC: 9.1 K/uL (ref 4.0–10.5)

## 2015-03-16 LAB — PREPARE RBC (CROSSMATCH)

## 2015-03-16 LAB — HEMOGLOBIN AND HEMATOCRIT, BLOOD
HEMATOCRIT: 23.3 % — AB (ref 39.0–52.0)
HEMOGLOBIN: 7.4 g/dL — AB (ref 13.0–17.0)

## 2015-03-16 MED ORDER — SODIUM CHLORIDE 0.9 % IV SOLN
Freq: Once | INTRAVENOUS | Status: AC
  Administered 2015-03-16: 06:00:00 via INTRAVENOUS

## 2015-03-16 MED ORDER — SODIUM BICARBONATE 650 MG PO TABS
1300.0000 mg | ORAL_TABLET | Freq: Three times a day (TID) | ORAL | Status: DC
  Administered 2015-03-16 – 2015-03-19 (×8): 1300 mg via ORAL
  Filled 2015-03-16 (×9): qty 2

## 2015-03-16 NOTE — Progress Notes (Signed)
S: On BiPap.  Discussed with pt and wife that HD is not a good options though we will cont to treat what we can but not do HD O:BP 142/75 mmHg  Pulse 72  Temp(Src) 97.8 F (36.6 C) (Axillary)  Resp 14  Ht 6' 0.5" (1.842 m)  Wt 105.9 kg (233 lb 7.5 oz)  BMI 31.21 kg/m2  SpO2 100%  Intake/Output Summary (Last 24 hours) at 03/16/15 0813 Last data filed at 03/16/15 3007  Gross per 24 hour  Intake    646 ml  Output   1200 ml  Net   -554 ml   Weight change: -0.3 kg (-10.6 oz) MAU:QJFHL and alert KTG:YBWLS. irreg Resp: Bilat crackles Rt>LT Abd:+ BS NTND LHT:DSKAJ edema NEURO:CNI OX3 + asterixis   Foley cath  in place   . aspirin  81 mg Oral Daily  . azithromycin  500 mg Oral Q24H  . carbidopa-levodopa  1 tablet Oral TID  . cefTRIAXone (ROCEPHIN)  IV  1 g Intravenous Q24H  . citalopram  10 mg Oral Daily  . cycloSPORINE  1 drop Both Eyes BID  . diltiazem  300 mg Oral Daily  . fluticasone  2 spray Each Nare Daily  . furosemide  160 mg Intravenous Q6H  . guaiFENesin  1,200 mg Oral BID  . insulin aspart  0-9 Units Subcutaneous TID WC  . levalbuterol  0.63 mg Nebulization TID  . metoprolol tartrate  25 mg Oral BID  . QUEtiapine  100 mg Oral QHS  . sodium bicarbonate  1,300 mg Oral TID  . sodium chloride  3 mL Intravenous Q12H  . tamsulosin  0.4 mg Oral Daily   No results found. BMET    Component Value Date/Time   NA 137 03/16/2015 0420   K 4.4 03/16/2015 0420   CL 101 03/16/2015 0420   CO2 16* 03/16/2015 0420   GLUCOSE 273* 03/16/2015 0420   BUN 203* 03/16/2015 0420   CREATININE 6.81* 03/16/2015 0420   CALCIUM 7.1* 03/16/2015 0420   GFRNONAA 7* 03/16/2015 0420   GFRAA 8* 03/16/2015 0420   CBC    Component Value Date/Time   WBC 9.1 03/16/2015 0420   WBC 5.1 11/23/2008 1045   RBC 2.46* 03/16/2015 0420   RBC 5.05 11/23/2008 1045   HGB 6.7* 03/16/2015 0420   HGB 14.4 11/23/2008 1045   HCT 21.1* 03/16/2015 0420   HCT 43.1 11/23/2008 1045   PLT 122* 03/16/2015  0420   PLT 73* 11/23/2008 1045   MCV 85.8 03/16/2015 0420   MCV 85.3 11/23/2008 1045   MCH 27.2 03/16/2015 0420   MCH 28.5 11/23/2008 1045   MCHC 31.8 03/16/2015 0420   MCHC 33.4 11/23/2008 1045   RDW 14.8 03/16/2015 0420   RDW 13.2 11/23/2008 1045   LYMPHSABS 0.4* 03/11/2015 0712   LYMPHSABS 2.4 11/23/2008 1045   MONOABS 1.1* 03/11/2015 0712   MONOABS 0.6 11/23/2008 1045   EOSABS 0.0 03/11/2015 0712   EOSABS 0.2 11/23/2008 1045   BASOSABS 0.0 03/11/2015 0712   BASOSABS 0.0 11/23/2008 1045     Assessment: 1. Acute on CKD 4.  Scr higher though UO is improving 2. PNA vs Pulm edema 3. A fib 4. Met acidosis on po bicarb 5. Anemia and iron def, on procrit as outpt.  If improves, then can give procrit and IV iron  Plan: 1. Wife and pt (though not sure how much he understands) agree that we will cont to treat what we can treat but will not  do HD.  Overall prognosis is poor.  UO is picking up so renal fx could recover?  2. Cont with IV lasix 3. If renal fx recovers then will resume procrit [place] T

## 2015-03-16 NOTE — Progress Notes (Signed)
CSW spoke with pt wife concerning PT recommendation for SNF.  Wife stated that they were not sure what was needed at this time and that she had been speaking with palliative regarding comfort care measures.  CSW explained SNF and that it could accommodate palliative goals- wife does not feel comfortable making a decision regarding SNF at this time and would like to speak further with the MD regarding disposition.  CSW will continue to follow  Domenica Reamer, [place] Social Worker [phone]

## 2015-03-16 NOTE — Plan of Care (Signed)
Problem: Respiratory: Goal: Respiratory status will improve Outcome: Progressing Pt is able to tolerate oxygen via [place] without periods of shortness of breath. Pt tolerates breathing treatments well. Pt is aware of use of BIPAP but states that he is ok with oxygen via [place] at this time

## 2015-03-16 NOTE — Progress Notes (Signed)
Occupational Therapy Treatment Patient Details Name: Gerald Gomez MRN: [id number] DOB: Jul 20, 1940 Today's Date: 03/16/2015    History of present illness Gerald Gomez is a 74 y.o. male, with idiopathic parkinsonism, mild dementia, diastolic heart failure, stage IV chronic kidney disease, peripheral vascular disease, and thrombocytopenia. He presents to the emergency department with dyspnea on exertion. Per chart pt intermittently confused at baseline. Admitted with acute hypoxic respiratory failure, symptomatic anemia, acute renal failure with acidosis, probable pneumonia ? Aspiration with vomiting.    OT comments  Pt with very limited activity tolerance.  Sat EOB x ~30 seconds with max encouragement with 02 sats decreasing to 79% 3L .  Increased to 92% on 5L.  Pt with complaint of generalized aches/pain  Follow Up Recommendations  SNF    Equipment Recommendations  None recommended by OT    Recommendations for Other Services      Precautions / Restrictions Precautions Precautions: Fall Precaution Comments: watch O2 sats       Mobility Bed Mobility Overal bed mobility: Needs Assistance Bed Mobility: Supine to Sit;Sit to Supine     Supine to sit: Mod assist Sit to supine: Mod assist   General bed mobility comments: assist to move LEs and UB.  Verbal cues for sequence   Transfers                      Balance Overall balance assessment: Needs assistance Sitting-balance support: Feet supported Sitting balance-Leahy Scale: Poor                             ADL                                         General ADL Comments: Pt unable to engage in ADL tasks due to 02 desaturation, and pt complaining of feeling poorly       Vision                     Perception     Praxis      Cognition   Behavior During Therapy: [facility] for tasks assessed/performed Overall Cognitive Status: History of cognitive impairments - at baseline  (Pt impulsive and with poor attention )                       Extremity/Trunk Assessment               Exercises     Shoulder Instructions       General Comments      Pertinent Vitals/ Pain       Pain Assessment: Faces Faces Pain Scale: Hurts little more Pain Location: generalized  Pain Descriptors / Indicators: Grimacing;Moaning  Home Living                                          Prior Functioning/Environment              Frequency Min 2X/week     Progress Toward Goals  OT Goals(current goals can now be found in the care plan section)  Progress towards OT goals: Not progressing toward goals - comment  ADL Goals Pt Will Perform Grooming: with min guard assist;standing (2  tasks) Pt Will Perform Upper Body Bathing: with set-up;with supervision;sitting Pt Will Perform Lower Body Bathing: with min assist (with min A sit<>stand) Pt Will Perform Upper Body Dressing: with supervision;with set-up;sitting Pt Will Perform Lower Body Dressing: with mod assist (with min A sit<>stand) Pt Will Transfer to Toilet: with min assist;ambulating;bedside commode (over toilet) Pt Will Perform Toileting - Clothing Manipulation and hygiene: with min assist;sit to/from stand Additional ADL Goal #1: Pt will be able to get up/down from recliner with min A (sleeps in recliner at home)  Plan Discharge plan remains appropriate    Co-evaluation                 End of Session Equipment Utilized During Treatment: Oxygen   Activity Tolerance Patient limited by lethargy;Patient limited by pain   Patient Left in bed;with call bell/phone within reach   Nurse Communication Mobility status        Time: [id number] OT Time Calculation (min): 12 min  Charges: OT General Charges $OT Visit: 1 Procedure OT Treatments $Therapeutic Activity: 8-22 mins  Gerald Gomez 03/16/2015, 5:36 PM

## 2015-03-16 NOTE — Progress Notes (Signed)
Results for JACHAI, KENNER (MRN [id number]) as of 03/16/2015 08:04  Ref. Range 03/14/2015 21:32 03/15/2015 08:36 03/15/2015 12:43 03/15/2015 16:32 03/15/2015 21:38  Glucose-Capillary Latest Ref Range: 65-99 mg/dL 313 (H) 175 (H) 231 (H) 287 (H) 310 (H)  Noted that patient's blood sugars are greater than 180 mg/dl. Recommend starting Lantus 15 units daily and continue Novolog correction scale as ordered if blood sugars continue to be elevated.  Will continue to follow while in hospital. Harvel Ricks RN BSN CDE

## 2015-03-16 NOTE — Progress Notes (Addendum)
Daily Progress Note   Patient Name: Gerald Gomez       Date: 03/16/2015 DOB: [birth year]  Age: 74 y.o. MRN#: [id number] Attending Physician: Elmarie Shiley, MD Primary Care Physician: [name], MD Admit Date: 03/11/2015  Reason for Consultation/Follow-up: Establishing goals of care  Subjective: I met again today with Gerald Gomez along with wife, [name], and their niece. I question Gerald Gomez on what he recalls from our conversation yesterday but he jokes and says that I should remember this - could not get an answer from him. We spoke further about dialysis today and they all agree that this would not be in his best interest - no dialysis. They tell me that they are still hopeful that medical management will work for him and they are aware that this has not really worked great for him so far but at least he is able to get fluid off. They understand this. We also discussed more in depth that with worsening renal failure and no dialysis - resuscitation would not really make sense to put him through CPR, shock, intubation if his kidneys continue to fail. They agree with this rationale as well and say that this was discussed some this morning. Will make DNR. So the goal is to continue medical management and support how we are at this time.   [name] is clearly uncomfortable discussing these decisions and topics and jokes with me that I always bring bad news to him. I apologized and explain that we are only discussing the facts and the reality of the disease he is dealing with and that I wished I did have better news to share with him. I continued to jester with him to help him cope with the situation. I told him I will see him tomorrow and he says okay. I will continue to work daily with them  and we will take this slowly. I have a feeling this is going to slowly play out for them here in the hospital and I am trying to prepare them for comfort measures but they are not quite there yet.     Length of Stay: 5 days  Current Medications: Scheduled Meds:  . aspirin  81 mg Oral Daily  . azithromycin  500 mg Oral Q24H  . carbidopa-levodopa  1 tablet Oral  TID  . cefTRIAXone (ROCEPHIN)  IV  1 g Intravenous Q24H  . citalopram  10 mg Oral Daily  . cycloSPORINE  1 drop Both Eyes BID  . diltiazem  300 mg Oral Daily  . fluticasone  2 spray Each Nare Daily  . furosemide  160 mg Intravenous Q6H  . guaiFENesin  1,200 mg Oral BID  . insulin aspart  0-9 Units Subcutaneous TID WC  . levalbuterol  0.63 mg Nebulization TID  . metoprolol tartrate  25 mg Oral BID  . QUEtiapine  100 mg Oral QHS  . sodium bicarbonate  1,300 mg Oral TID  . sodium chloride  3 mL Intravenous Q12H  . tamsulosin  0.4 mg Oral Daily    Continuous Infusions:    PRN Meds: acetaminophen **OR** acetaminophen, alum & mag hydroxide-simeth, levalbuterol, ondansetron **OR** ondansetron (ZOFRAN) IV, senna-docusate  Physical Exam: Physical Exam  Constitutional: He appears well-developed and well-nourished.  HENT:  Head: Normocephalic and atraumatic.  Cardiovascular: Normal rate.   Pulmonary/Chest: Effort normal. No accessory muscle usage. No tachypnea. No respiratory distress.  Abdominal: Normal appearance.  Neurological: He is alert.  Oriented to person, place, somewhat to situation. Uses humor and sarcasm to get out of decisions and telling me what he recalls from talking with myself and other providers.                 Vital Signs: BP 128/76 mmHg  Pulse 75  Temp(Src) 97.5 F (36.4 C) (Oral)  Resp 16  Ht 6' 0.5" (1.842 m)  Wt 105.9 kg (233 lb 7.5 oz)  BMI 31.21 kg/m2  SpO2 92% SpO2: SpO2: 92 % O2 Device: O2 Device: Bi-PAP O2 Flow Rate: O2 Flow Rate (L/min): 2.5 L/min  Intake/output summary:    Intake/Output Summary (Last 24 hours) at 03/16/15 1231 Last data filed at 03/16/15 3354  Gross per 24 hour  Intake    646 ml  Output   1200 ml  Net   -554 ml   LBM: Last BM Date: 03/10/15 Baseline Weight: Weight: 97.268 kg (214 lb 7 oz) Most recent weight: Weight: 105.9 kg (233 lb 7.5 oz)       Palliative Assessment/Data: Flowsheet Rows        Most Recent Value   Intake Tab    Referral Department  Hospitalist   Unit at Time of Referral  Intermediate Care Unit   Palliative Care Primary Diagnosis  Neurology   Date Notified  03/15/15   Palliative Care Type  New Palliative care   Reason for referral  Clarify Goals of Care   Date of Admission  03/11/15   Date first seen by Palliative Care  03/15/15   # of days Palliative referral response time  0 Day(s)   # of days IP prior to Palliative referral  4   Clinical Assessment    Psychosocial & Spiritual Assessment    Palliative Care Outcomes       Additional Data Reviewed: CBC    Component Value Date/Time   WBC 9.1 03/16/2015 0420   WBC 5.1 11/23/2008 1045   RBC 2.46* 03/16/2015 0420   RBC 5.05 11/23/2008 1045   HGB 6.7* 03/16/2015 0420   HGB 14.4 11/23/2008 1045   HCT 21.1* 03/16/2015 0420   HCT 43.1 11/23/2008 1045   PLT 122* 03/16/2015 0420   PLT 73* 11/23/2008 1045   MCV 85.8 03/16/2015 0420   MCV 85.3 11/23/2008 1045   MCH 27.2 03/16/2015 0420   MCH 28.5 11/23/2008 1045  MCHC 31.8 03/16/2015 0420   MCHC 33.4 11/23/2008 1045   RDW 14.8 03/16/2015 0420   RDW 13.2 11/23/2008 1045   LYMPHSABS 0.4* 03/11/2015 0712   LYMPHSABS 2.4 11/23/2008 1045   MONOABS 1.1* 03/11/2015 0712   MONOABS 0.6 11/23/2008 1045   EOSABS 0.0 03/11/2015 0712   EOSABS 0.2 11/23/2008 1045   BASOSABS 0.0 03/11/2015 0712   BASOSABS 0.0 11/23/2008 1045    CMP     Component Value Date/Time   NA 137 03/16/2015 0420   K 4.4 03/16/2015 0420   CL 101 03/16/2015 0420   CO2 16* 03/16/2015 0420   GLUCOSE 273* 03/16/2015 0420   BUN 203*  03/16/2015 0420   CREATININE 6.81* 03/16/2015 0420   CALCIUM 7.1* 03/16/2015 0420   PROT 7.4 03/12/2015 0037   ALBUMIN 2.1* 03/16/2015 0420   AST 36 03/12/2015 0037   ALT 6* 03/12/2015 0037   ALKPHOS 68 03/12/2015 0037   BILITOT 0.5 03/12/2015 0037   GFRNONAA 7* 03/16/2015 0420   GFRAA 8* 03/16/2015 0420       Problem List:  Patient Active Problem List   Diagnosis Date Noted  . Palliative care encounter 03/15/2015  . CAP (community acquired pneumonia) 03/11/2015  . Acute on chronic kidney failure ([place]) 03/11/2015  . Symptomatic anemia 03/11/2015  . Hypoxemia requiring supplemental oxygen 03/11/2015  . Hyperglycemia 03/11/2015  . Dementia with behavioral disturbance 12/07/2014  . Gait disorder 12/07/2014  . General weakness 12/07/2014  . Paroxysmal SVT (supraventricular tachycardia) ([place]) 11/29/2014  . PAF (paroxysmal atrial fibrillation) ([place]) 11/29/2014  . Anemia, chronic disease 11/29/2014  . Acute diastolic heart failure, NYHA class 2 ([place]) 11/17/2014  . HOCM (hypertrophic obstructive cardiomyopathy) ([place]) 11/17/2014  . QT prolongation 11/17/2014  . CKD (chronic kidney disease) stage 4, GFR 15-29 ml/min ([facility])   . Acute on chronic diastolic heart failure ([place])   . Metabolic acidosis 37/90/2409  . Acute respiratory failure with hypoxia ([place]) 11/13/2014  . COPD (chronic obstructive pulmonary disease) ([place]) 11/13/2014  . Malignant neoplasm of prostate ([place]) 07/15/2014  . GERD (gastroesophageal reflux disease) 02/17/2014  . Acromioclavicular joint arthritis 02/15/2014  . Impaired glucose tolerance 12/01/2013  . PD (Parkinson's disease) ([place]) 09/28/2013  . Chronic low back pain 09/01/2013  . 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 Recurrent falls 07/28/2013  . Chronic diastolic heart failure ([place]) 03/25/2013  . Anemia in CKD (chronic kidney disease) 03/25/2013  . Overactive bladder 11/23/2011  . Left knee pain 04/10/2011  . Bladder neck obstruction 04/10/2011  .  Preventative health care 10/01/2010  . 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 Hepatitis C 03/28/2009  . Hyperlipidemia 03/28/2009  . Thrombocytopenia ([place]) 03/28/2009  . Anxiety state 03/28/2009  . Depression 03/28/2009  . Hypertensive heart disease with CHF ([place]) 03/28/2009  . PERIPHERAL VASCULAR DISEASE 03/28/2009  . ALLERGIC RHINITIS 03/28/2009  . DIVERTICULOSIS, COLON 03/28/2009  . CHOLELITHIASIS 03/28/2009  . Osteoarthritis 03/28/2009  . [place] DISEASE, LUMBAR 03/28/2009  . LOW BACK PAIN, CHRONIC 03/28/2009  . FATIGUE 03/28/2009  . Personal history of alcoholism ([place]) 03/28/2009     Palliative Care Assessment & Plan    1.Code Status:  DNR    Code Status Orders        Start     Ordered   03/16/15 1230  Do not attempt resuscitation (DNR)   Continuous    Question Answer Comment  In the event of cardiac or respiratory ARREST Do not call a "code blue"   In the event of cardiac or  respiratory ARREST Do not perform Intubation, CPR, defibrillation or ACLS   In the event of cardiac or respiratory ARREST Use medication by any route, position, wound care, and other measures to relive pain and suffering. May use oxygen, suction and manual treatment of airway obstruction as needed for comfort.      03/16/15 1229       2. Goals of Care/Additional Recommendations:  No dialysis, DNR. They are still hopeful that he could have some improvement.   Limitations on Scope of Treatment: No Hemodialysis  Desire for further Chaplaincy support:no  Psycho-social Needs: Caregiving  Support/Resources  3. Symptom Management:      1. Chronic back pain: Denies pain currently. Gabapentin stopped and this may influence his pain. High risk for delirium so would utilize opioids judiciously unless transition to comfort care. Could consider hydrocodone-acetaminophen 5/325 mg if needed.  4. Palliative Prophylaxis:   Bowel Regimen, Delirium Protocol, Frequent Pain Assessment and Oral  Care  5. Prognosis: Very poor - with worsening renal failure could be days to weeks - did not share this with pt/family as they are overwhelmed already. I have been clear that with worsening renal failure time would be very limited.   6. Discharge Planning:  To be determined.    Thank you for allowing the Palliative Medicine Team to assist in the care of this patient.   Time In: 1200 Time Out: 1240 Total Time 70mn Prolonged Time Billed  no         APershing Proud NP  100/51/1021 12:31 PM  Please contact Palliative Medicine Team phone at [phone] questions and concerns.

## 2015-03-16 NOTE — Progress Notes (Signed)
SATURATION QUALIFICATIONS: (This note is used to comply with regulatory documentation for home oxygen)  Patient Saturations on Room Air at Rest = 86-87%  Patient Saturations on Room Air while Ambulating = did not attempt due to low O2 at rest  Patient Saturations on 3 Liters of oxygen while Ambulating = 90-92%  Please briefly explain why patient needs home oxygen:Pt on 2LO2 on arrival.  See above for sats at rest and with ambulation.  May need home O2.  Thanks.  [place] [phone] (pager)

## 2015-03-16 NOTE — Progress Notes (Signed)
Physical Therapy Treatment Patient Details Name: Gerald Gomez MRN: [id number] DOB: 1940/04/01 Today's Date: 03/16/2015    History of Present Illness Gerald Gomez is a 74 y.o. male, with idiopathic parkinsonism, mild dementia, diastolic heart failure, stage IV chronic kidney disease, peripheral vascular disease, and thrombocytopenia. He presents to the emergency department with dyspnea on exertion. Per chart pt intermittently confused at baseline. Admitted with acute hypoxic respiratory failure, symptomatic anemia, acute renal failure with acidosis, probable pneumonia ? Aspiration with vomiting.     PT Comments    Pt admitted with above diagnosis. Pt currently with functional limitations due to the deficits listed below (see PT Problem List). Pt was able to transfer to chair only today due to fatigue and weakness.  Desat as well with mobility.  Updated frequency to 2x/week as this is appropriate frequency for pt given that he will need SNF at d/c.  Will try to see 3xweek this week if pt can tolerate.   Pt will benefit from skilled PT to increase their independence and safety with mobility to allow discharge to the venue listed below.    Follow Up Recommendations  SNF;Supervision/Assistance - 24 hour     Equipment Recommendations  Other (comment) (TBA)    Recommendations for Other Services       Precautions / Restrictions Precautions Precautions: Fall Precaution Comments: watch O2 sats Restrictions Weight Bearing Restrictions: No    Mobility  Bed Mobility Overal bed mobility: Needs Assistance Bed Mobility: Supine to Sit     Supine to sit: Mod assist     General bed mobility comments: VCs for sequencing and hand placement.  Assist for LEs and elevation of trunk today with pt moaning at times.  Very fatigued and needed assist to keep balance initially at EOB.   Transfers Overall transfer level: Needs assistance Equipment used: Rolling walker (2 wheeled) Transfers: Sit  to/from [facility] Sit to Stand: Mod assist;From elevated surface Stand pivot transfers: Mod assist       General transfer comment: cues needed for hand placement.  Did not stand fully upright and needing steadying assist upon standing and throughout the transfer as pt was not steady even with RW.  Desats as well.  See below.    Ambulation/Gait                 Stairs            Wheelchair Mobility    Modified Rankin (Stroke Patients Only)       Balance Overall balance assessment: Needs assistance;History of Falls Sitting-balance support: Bilateral upper extremity supported;Feet supported Sitting balance-Leahy Scale: Poor Sitting balance - Comments: Needed Ue support for balance    Standing balance support: Bilateral upper extremity supported;During functional activity Standing balance-Leahy Scale: Poor Standing balance comment: relies on RW for balance.                    Cognition Arousal/Alertness: Awake/alert Behavior During Therapy: WFL for tasks assessed/performed Overall Cognitive Status: History of cognitive impairments - at baseline (dereased safety awareness with RW)                      Exercises General Exercises - Lower Extremity Ankle Circles/Pumps: AROM;Both;10 reps;Seated Long Arc Quad: AROM;Both;Seated;5 reps    General Comments        Pertinent Vitals/Pain Pain Assessment: No/denies pain  O2 94% on 2LO2 at rest.  Desats to 85% on 2LO2 with activity therefore incr to 3LO2  to keep sats >90%.  Within 2 min, able to turn O2 back to 2L with sats 97%.  Other VSS    Home Living                      Prior Function            PT Goals (current goals can now be found in the care plan section) Progress towards PT goals: Not progressing toward goals - comment (very fatigued today as pt was only able to  transfer)    Frequency  Min 2X/week    PT Plan Frequency needs to be updated     Co-evaluation             End of Session Equipment Utilized During Treatment: Gait belt;Oxygen Activity Tolerance: Patient limited by fatigue Patient left: in chair;with call bell/phone within reach;with chair alarm set     Time: [id number] PT Time Calculation (min) (ACUTE ONLY): 17 min  Charges:  $Therapeutic Activity: 8-22 mins                    G CodesIrwin Brakeman F 03/17/2015, 10:15 AM Brayson Livesey,PT Acute Rehabilitation [phone] (pager)

## 2015-03-16 NOTE — Progress Notes (Signed)
Pt placed on BIPAP at this time due to work of breathing. Pt tolerating well at this time, will continue to monitor.

## 2015-03-16 NOTE — Progress Notes (Signed)
[facility] PROGRESS NOTE  AMANI NODARSE DOD:[id number] DOB: [birth year] DOA: 03/11/2015  PCP: [name], MD  Brief HPI: 74 year old African-American male with past medical history of idiopathic parkinsonism, mild dementia, diastolic heart failure, stage IV chronic kidney disease, thrombocytopenia, presented with complaints of shortness of breath. He was admitted to the hospital for possible community-acquired pneumonia. Also noted to have acute on chronic renal failure. Renal failure continued to get worse. Patient developed pulmonary edema requiring BiPAP. Patient thought to be a poor candidate for dialysis and had previously expressed a wish not to be dialyzed per his wife. In view of this palliative medicine was consulted.  Past medical history:  Past Medical History  Diagnosis Date  . ALLERGIC RHINITIS 03/28/2009  . ANXIETY 03/28/2009  . CHOLELITHIASIS 03/28/2009  . DEPRESSION 03/28/2009  . DIVERTICULOSIS, COLON 03/28/2009  . 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 FATIGUE 03/28/2009  . HYPERLIPIDEMIA 03/28/2009  . HYPERTENSION 03/28/2009  . LOW BACK PAIN, CHRONIC 03/28/2009  . PERIPHERAL VASCULAR DISEASE 03/28/2009  . Personal history of alcoholism ([place]) 03/28/2009  . THROMBOCYTOPENIA 03/28/2009  . Wheezing 02/07/2010  . CHF (congestive heart failure) ([place])   . Impaired glucose tolerance 12/01/2013  . Heart murmur   . Exertional dyspnea 11/04/2014  . History of stomach ulcers   . HEPATITIS C 03/28/2009  . DEGENERATIVE JOINT DISEASE 03/28/2009  . Central DISEASE, LUMBAR 03/28/2009  . Arthritis     "everywhere"  . Chronic kidney disease, stage 3     /notes 11/04/2014  . Chronic anemia     [name] 11/04/2014  . Thrombocytopenia ([place])     chronic/notes 11/04/2014  . Malignant neoplasm prostate ([place])     "in treatment for it now" (11/04/2014)  . Pneumonia     Sepsis- suspecting [name] 11/04/2014    Consultants: Nephrology. Palliative medicine  Procedures: None  Antibiotics: Ceftriaxone and  azithromycin 12/16  Subjective:   Objective: Vital Signs  Filed Vitals:   03/16/15 0200 03/16/15 0400 03/16/15 0409 03/16/15 0600  BP: 133/79 129/77 129/77 131/72  Pulse: 93 80 76 70  Temp:   98.1 F (36.7 C)   TempSrc:   Axillary   Resp: _0 Height:      Weight:   105.9 kg (233 lb 7.5 oz)   SpO2: 100% 100% 100% 100%    Intake/Output Summary (Last 24 hours) at 03/16/15 0727 Last data filed at 03/16/15 9037  Gross per 24 hour  Intake    646 ml  Output   1200 ml  Net   -554 ml   Filed Weights   03/14/15 0359 03/15/15 0500 03/16/15 0409  Weight: 102.5 kg (225 lb 15.5 oz) 106.2 kg (234 lb 2.1 oz) 105.9 kg (233 lb 7.5 oz)    General appearance:  cooperative, appears stated age and no distress Resp: Continues to have crackles at the bases. No rhonchi. No wheezing. Cardio: S1, S2 normal regular , no murmur, click, rub or gallop GI: soft, non-tender; bowel sounds normal; no masses,  no organomegaly Extremities: extremities normal, atraumatic, no cyanosis or edema Neurologic: Sleepy but arousable. Moving all of his extremities. No facial asymmetry.  Lab Results:  Basic Metabolic Panel:  Recent Labs Lab 03/12/15 0037 03/13/15 0321 03/14/15 0256 03/15/15 0240 03/16/15 0420  NA 139 138 135 138 137  K 5.2* 4.7 4.0 4.1 4.4  CL 115* 108 102 103 101  CO2 15* 17* 16* 19* 16*  GLUCOSE 152* 149* 289* 251* 273*  BUN 86*  119* 147* 171* 203*  CREATININE 3.91* 5.08* 5.89* 6.43* 6.81*  CALCIUM 8.9 8.3* 7.7* 7.4* 7.1*  PHOS  --   --   --  10.1* 11.3*   Liver Function Tests:  Recent Labs Lab 03/11/15 0712 03/12/15 0037 03/15/15 0240 03/16/15 0420  AST 42* 36  --   --   ALT 12* 6*  --   --   ALKPHOS 75 68  --   --   BILITOT 0.2* 0.5  --   --   PROT 7.3 7.4  --   --   ALBUMIN 2.7* 2.6* 2.2* 2.1*   CBC:  Recent Labs Lab 03/11/15 0712 03/12/15 0037 03/13/15 0321 03/14/15 0256 03/15/15 0240 03/16/15 0420  WBC 9.5 13.1* 11.2* 10.1 7.4 9.1  NEUTROABS  7.9*  --   --   --   --   --   HGB 7.2* 7.9* 7.5* 7.6* 7.2* 6.7*  HCT 23.3* 25.6* 23.8* 24.5* 22.5* 21.1*  MCV 89.6 89.2 87.5 87.2 86.5 85.8  PLT 90* 91* 103* 114* 109* 122*   Cardiac Enzymes:  Recent Labs Lab 03/11/15 0712 03/11/15 1845 03/12/15 0037 03/13/15 0947  CKTOTAL  --   --   --  2147*  TROPONINI 0.05* 0.08* 0.06*  --    BNP (last 3 results)  Recent Labs  11/14/14 1601 11/17/14 0636 03/11/15 0712  BNP 2334.6* 1430.0* 1299.4*   CBG:  Recent Labs Lab 03/14/15 2132 03/15/15 0836 03/15/15 1243 03/15/15 1632 03/15/15 2138  GLUCAP 313* 175* 231* 287* 310*    Recent Results (from the past 240 hour(s))  Culture, blood (Routine X 2) w Reflex to ID Panel     Status: None (Preliminary result)   Collection Time: 03/11/15  8:50 AM  Result Value Ref Range Status   Specimen Description BLOOD RIGHT ANTECUBITAL  Final   Special Requests BOTTLES DRAWN AEROBIC AND ANAEROBIC 10CC  Final   Culture NO GROWTH 4 DAYS  Final   Report Status PENDING  Incomplete  Culture, blood (Routine X 2) w Reflex to ID Panel     Status: None (Preliminary result)   Collection Time: 03/11/15  8:58 AM  Result Value Ref Range Status   Specimen Description BLOOD RIGHT HAND  Final   Special Requests BOTTLES DRAWN AEROBIC ONLY 10CC  Final   Culture NO GROWTH 4 DAYS  Final   Report Status PENDING  Incomplete  MRSA PCR Screening     Status: None   Collection Time: 03/11/15  5:16 PM  Result Value Ref Range Status   MRSA by PCR NEGATIVE NEGATIVE Final    Comment:        The GeneXpert MRSA Assay (FDA approved for NASAL specimens only), is one component of a comprehensive MRSA colonization surveillance program. It is not intended to diagnose MRSA infection nor to guide or monitor treatment for MRSA infections.       Studies/Results: No results found.  Medications:  Scheduled: . aspirin  81 mg Oral Daily  . azithromycin  500 mg Oral Q24H  . carbidopa-levodopa  1 tablet Oral TID  .  cefTRIAXone (ROCEPHIN)  IV  1 g Intravenous Q24H  . citalopram  10 mg Oral Daily  . cycloSPORINE  1 drop Both Eyes BID  . diltiazem  300 mg Oral Daily  . fluticasone  2 spray Each Nare Daily  . furosemide  160 mg Intravenous Q6H  . guaiFENesin  1,200 mg Oral BID  . insulin aspart  0-9 Units Subcutaneous TID  WC  . levalbuterol  0.63 mg Nebulization TID  . metoprolol tartrate  25 mg Oral BID  . QUEtiapine  100 mg Oral QHS  . sodium bicarbonate  1,300 mg Oral TID  . sodium chloride  3 mL Intravenous Q12H  . tamsulosin  0.4 mg Oral Daily   Continuous:   WRW:[id number] **OR** acetaminophen, alum & mag hydroxide-simeth, levalbuterol, ondansetron **OR** ondansetron (ZOFRAN) IV, senna-docusate  Assessment/Plan:  Principal Problem:   Hypoxemia requiring supplemental oxygen Active Problems:   Hepatitis C   Thrombocytopenia (HCC)   Hypertensive heart disease with CHF (HCC)   PERIPHERAL VASCULAR DISEASE   LOW BACK PAIN, CHRONIC   Chronic diastolic heart failure (HCC)   Neurologic gait disorder   PD (Parkinson's disease) (HCC)   Malignant neoplasm of prostate (HCC)   COPD (chronic obstructive pulmonary disease) (HCC)   Metabolic acidosis   CKD (chronic kidney disease) stage 4, GFR 15-29 ml/min (HCC)   PAF (paroxysmal atrial fibrillation) (HCC)   CAP (community acquired pneumonia)   Acute on chronic kidney failure (HCC)   Symptomatic anemia   Hyperglycemia   Palliative care encounter    Acute on chronic respiratory failure with hypoxemia Pulmonary edema noted on chest x-ray 12/18. Significant change compared to previous films. This is most likely due to fluid overload from his kidney failure. Patient had to be placed on BiPAP.  Also continue antibiotics for now as initial films had suggested pneumonitis. - Likely discontinue after 7 days of treatment. Day 5 of antibiotics.  -1.2 L negative. On IV lasix 160 mg IV every 6 hours/   Acute on chronic kidney failure with metabolic  acidosis/hyperkalemia -Renal function continues to get worse. Nephrology is following. Also appreciated urology for placing Foley catheter. Patient is now on high-dose Lasix.  -Patient is considered to be a poor candidate for dialysis.  -Renal ultrasound as above. No hydronephrosis noted.  -Palliative care meeting again today.  -met with patient 's wife and Dr Briant Cedar. Wife think dialysis will be hard on patient, she doesn't think he will be able to go to dialysis 3 times per week. He has been using a wheelchair. Wife would like to treat what is possible. No dialysis. Patient was in agreement with wife decisions. I discussed Code status. Patient will be DNR/DNI. Palliative care meeting this afternoon. Wife understand that is renal function get worse, comfort care will be the next step/   Anemia likely due to chronic kidney disease  He was transfused 1 unit of blood at the time of admission. Patient is followed by nephrology as an outpatient and apparently gets marrow stimulating agents. No overt bleeding noted. Continue to monitor. Hb at 6 today. One unit PRBC transfusion.   Chronic Diastolic heart failure Initial CXR was negative for acute pulm edema. Chest x-ray from 12/18 shows diffuse pulmonary edema. Last 2D echo showed and LVEF of 65 - 70%.Continue strict ins and outs and daily weights. Mildly elevated troponins noted. Significance unclear, likely due to demand ischemia. Patient denies any chest pain. -On IV lasix.   Hyperglycemia No documented history of diabetes.HbA1c is 6.1. Continue sliding scale coverage.  Idiopathic parkinsons with mild dementia Continue Sinemet IR.  Paroxysmal Atrial Fibrillation Currently in sinus rhythm. His episode of atrial fibrillation was back in August. CHADS2VASC his at least 4. However, patient has not been thought to be a candidate for anticoagulation due to compliance issues, thrombocytopenia. Continue Cardizem and metoprolol. Continue  aspirin.  Chronic Thrombocytopenia Baseline appears to be in the  120s.Continue to monitor closely.No pharmacologic VTE prophylaxis. Stable.   DVT Prophylaxis: SCDs    Code Status: Full code for now. Await palliative medicine input. Family Communication:   Disposition Plan: Continue management as outlined above. Continue to remain in step down unit for now. Await palliative medicine meeting for today.     LOS: 5 days   Niel Hummer A  [facility] Pager [phone] 03/16/2015, 7:27 AM  If 7PM-7AM, please contact night-coverage at [email or web address], password [facility]

## 2015-03-17 LAB — GLUCOSE, CAPILLARY
GLUCOSE-CAPILLARY: 217 mg/dL — AB (ref 65–99)
GLUCOSE-CAPILLARY: 183 mg/dL — AB (ref 65–99)
Glucose-Capillary: 211 mg/dL — ABNORMAL HIGH (ref 65–99)
Glucose-Capillary: 185 mg/dL — ABNORMAL HIGH (ref 65–99)

## 2015-03-17 LAB — CBC
HCT: 22.7 % — ABNORMAL LOW (ref 39.0–52.0)
Hemoglobin: 7.4 g/dL — ABNORMAL LOW (ref 13.0–17.0)
MCH: 27.5 pg (ref 26.0–34.0)
MCHC: 32.6 g/dL (ref 30.0–36.0)
MCV: 84.4 fL (ref 78.0–100.0)
Platelets: 113 10*3/uL — ABNORMAL LOW (ref 150–400)
RBC: 2.69 MIL/uL — ABNORMAL LOW (ref 4.22–5.81)
RDW: 15.3 % (ref 11.5–15.5)
WBC: 8.6 10*3/uL (ref 4.0–10.5)

## 2015-03-17 LAB — RENAL FUNCTION PANEL
ALBUMIN: 2.3 g/dL — AB (ref 3.5–5.0)
Anion gap: 21 — ABNORMAL HIGH (ref 5–15)
BUN: 236 mg/dL — AB (ref 6–20)
CALCIUM: 7.2 mg/dL — AB (ref 8.9–10.3)
CO2: 17 mmol/L — ABNORMAL LOW (ref 22–32)
Chloride: 100 mmol/L — ABNORMAL LOW (ref 101–111)
Creatinine, Ser: 7.3 mg/dL — ABNORMAL HIGH (ref 0.61–1.24)
GFR calc Af Amer: 7 mL/min — ABNORMAL LOW (ref >60)
GFR calc non Af Amer: 6 mL/min — ABNORMAL LOW (ref >60)
GLUCOSE: 263 mg/dL — AB (ref 65–99)
PHOSPHORUS: 7.8 mg/dL — AB (ref 2.5–4.6)
Potassium: 4.8 mmol/L (ref 3.5–5.1)
SODIUM: 138 mmol/L (ref 135–145)

## 2015-03-17 MED ORDER — GLYCOPYRROLATE 0.2 MG/ML IJ SOLN
0.2000 mg | INTRAMUSCULAR | Status: DC | PRN
  Filled 2015-03-17: qty 1

## 2015-03-17 MED ORDER — QUETIAPINE FUMARATE 25 MG PO TABS
50.0000 mg | ORAL_TABLET | Freq: Every day | ORAL | Status: DC
  Administered 2015-03-17 – 2015-03-20 (×4): 50 mg via ORAL
  Filled 2015-03-17 (×4): qty 2

## 2015-03-17 MED ORDER — FENTANYL CITRATE (PF) 100 MCG/2ML IJ SOLN: 12.5000 ug | INTRAMUSCULAR | Status: DC | PRN

## 2015-03-17 NOTE — Clinical Social Work Note (Signed)
Clinical Social Work Assessment  Patient Details  Name: Gerald Gomez MRN: [id number] Date of Birth: [birth year]  Date of referral:  03/17/15               Reason for consult:  Facility Placement                Permission sought to share information with:  Facility Sport and exercise psychologist, Family Supports Permission granted to share information::  No (Patient sleeping; completed assessment w/ wife)  Name::     Gerald Gomez  Agency::     Relationship::  Wife  Contact Information:  [phone]  Housing/Transportation Living arrangements for the past 2 months:  Single Family Home Source of Information:  Spouse Patient Interpreter Needed:  None Criminal Activity/Legal Involvement Pertinent to Current Situation/Hospitalization:  No - Comment as needed Significant Relationships:  Spouse Lives with:  Spouse Do you feel safe going back to the place where you live?  No Need for family participation in patient care:  Yes (Comment)  Care giving concerns:  CSW received referral for possible SNF placement at time of discharge. CSW spoke w/ patient's wife regarding PT recommendation of SNF placement at time of discharge. Per patient's wife, patient's wife is currently unable to care for patient at their home given patient's current physical needs and fall risk. Patient's wife expressed understanding of PT recommendation and is not currently agreeable to SNF placement at time of discharge. Patient's wife would like to speak w/ MD before making a decision for discharge. CSW to continue to follow and assist with discharge planning needs.   Social Worker assessment / plan:  CSW spoke with patient's wife concerning possibility of rehab at [facility] before returning home.  Employment status:  Retired Forensic scientist:  Medicare PT Recommendations:  [place] / Referral to community resources:  [place]  Patient/Family's Response to care:  Patient's wife reported  recognizing need for help at discharge, but is not sure that a SNF would be helpful. She also reported that she would not be able to bring her husband back home for his last days.   Patient/Family's Understanding of and Emotional Response to Diagnosis, Current Treatment, and Prognosis:  No questions at this time.   Emotional Assessment Appearance:  Appears stated age Attitude/Demeanor/Rapport:    Affect (typically observed):   (Assessment completed w/ wife) Orientation:  Oriented to Self, Oriented to Place, Oriented to Situation Alcohol / Substance use:  Not Applicable Psych involvement (Current and /or in the community):  No (Comment)  Discharge Needs  Concerns to be addressed:  Care Coordination Readmission within the last 30 days:  No Current discharge risk:  None Barriers to Discharge:  Continued Medical Work up   [employer], [place] 03/17/2015, 5:56 PM

## 2015-03-17 NOTE — Progress Notes (Signed)
Reveived from ICU/SDU A&Ox2 wife at bedside skin WD will monitor

## 2015-03-17 NOTE — Progress Notes (Signed)
Daily Progress Note   Patient Name: Gerald Gomez       Date: 03/17/2015 DOB: June 21, 1940  Age: 74 y.o. MRN#: [id number] Attending Physician: Elmarie Shiley, MD Primary Care Physician: [name], MD Admit Date: 03/11/2015  Reason for Consultation/Follow-up: Establishing goals of care  Subjective: I met again today with Mr. Spivack and [name]. Mr. Leverich appears significantly changed from yesterday - much more lethargic and more confused. I explained to [name] that this is what I would expect as his kidneys continue to shut down. We discussed expectations and that he very likely could only have days to live. She was very understanding. I encouraged her to let any family that needs to visit come today if they could. She understands. She did not appear shocked by this information. I offered emotional support. We discussed a plan to transfer to a more comfortable room for them and she wishes to continue labs and current medications. I explained the importance of focusing on making sure he is as happy and comfortable as possible. She agrees. Will continue to support.     Length of Stay: 6 days  Current Medications: Scheduled Meds:  . aspirin  81 mg Oral Daily  . azithromycin  500 mg Oral Q24H  . carbidopa-levodopa  1 tablet Oral TID  . cefTRIAXone (ROCEPHIN)  IV  1 g Intravenous Q24H  . citalopram  10 mg Oral Daily  . cycloSPORINE  1 drop Both Eyes BID  . diltiazem  300 mg Oral Daily  . fluticasone  2 spray Each Nare Daily  . furosemide  160 mg Intravenous Q6H  . guaiFENesin  1,200 mg Oral BID  . insulin aspart  0-9 Units Subcutaneous TID WC  . levalbuterol  0.63 mg Nebulization TID  . metoprolol tartrate  25 mg Oral BID  . QUEtiapine  100 mg Oral QHS  . sodium bicarbonate  1,300  mg Oral TID  . sodium chloride  3 mL Intravenous Q12H  . tamsulosin  0.4 mg Oral Daily    Continuous Infusions:    PRN Meds: acetaminophen **OR** acetaminophen, alum & mag hydroxide-simeth, levalbuterol, ondansetron **OR** ondansetron (ZOFRAN) IV, senna-docusate  Physical Exam: Physical Exam  Constitutional: He appears well-developed and well-nourished. He appears lethargic.  HENT:  Head: Normocephalic and atraumatic.  Cardiovascular: Normal rate.  Pulmonary/Chest: Effort normal. No accessory muscle usage. No tachypnea. No respiratory distress.  Abdominal: Normal appearance.  Neurological: He appears lethargic. He is disoriented.  Oriented to person, place, somewhat to situation. Uses humor and sarcasm to get out of decisions and telling me what he recalls from talking with myself and other providers.                 Vital Signs: BP 143/80 mmHg  Pulse 91  Temp(Src) 97.1 F (36.2 C) (Axillary)  Resp 14  Ht 6' 0.5" (1.842 m)  Wt 106.3 kg (234 lb 5.6 oz)  BMI 31.33 kg/m2  SpO2 100% SpO2: SpO2: 100 % O2 Device: O2 Device: Nasal Cannula O2 Flow Rate: O2 Flow Rate (L/min): 5 L/min  Intake/output summary:   Intake/Output Summary (Last 24 hours) at 03/17/15 1151 Last data filed at 03/17/15 0940  Gross per 24 hour  Intake    845 ml  Output    825 ml  Net     20 ml   LBM: Last BM Date: 03/16/15 Baseline Weight: Weight: 97.268 kg (214 lb 7 oz) Most recent weight: Weight: 106.3 kg (234 lb 5.6 oz)       Palliative Assessment/Data: Flowsheet Rows        Most Recent Value   Intake Tab    Referral Department  Hospitalist   Unit at Time of Referral  Intermediate Care Unit   Palliative Care Primary Diagnosis  Neurology   Date Notified  03/15/15   Palliative Care Type  New Palliative care   Reason for referral  Clarify Goals of Care   Date of Admission  03/11/15   Date first seen by Palliative Care  03/15/15   # of days Palliative referral response time  0 Day(s)   # of  days IP prior to Palliative referral  4   Clinical Assessment    Psychosocial & Spiritual Assessment    Palliative Care Outcomes       Additional Data Reviewed: CBC    Component Value Date/Time   WBC 8.6 03/17/2015 0306   WBC 5.1 11/23/2008 1045   RBC 2.69* 03/17/2015 0306   RBC 5.05 11/23/2008 1045   HGB 7.4* 03/17/2015 0306   HGB 14.4 11/23/2008 1045   HCT 22.7* 03/17/2015 0306   HCT 43.1 11/23/2008 1045   PLT 113* 03/17/2015 0306   PLT 73* 11/23/2008 1045   MCV 84.4 03/17/2015 0306   MCV 85.3 11/23/2008 1045   MCH 27.5 03/17/2015 0306   MCH 28.5 11/23/2008 1045   MCHC 32.6 03/17/2015 0306   MCHC 33.4 11/23/2008 1045   RDW 15.3 03/17/2015 0306   RDW 13.2 11/23/2008 1045   LYMPHSABS 0.4* 03/11/2015 0712   LYMPHSABS 2.4 11/23/2008 1045   MONOABS 1.1* 03/11/2015 0712   MONOABS 0.6 11/23/2008 1045   EOSABS 0.0 03/11/2015 0712   EOSABS 0.2 11/23/2008 1045   BASOSABS 0.0 03/11/2015 0712   BASOSABS 0.0 11/23/2008 1045    CMP     Component Value Date/Time   NA 138 03/17/2015 0306   K 4.8 03/17/2015 0306   CL 100* 03/17/2015 0306   CO2 17* 03/17/2015 0306   GLUCOSE 263* 03/17/2015 0306   BUN 236* 03/17/2015 0306   CREATININE 7.30* 03/17/2015 0306   CALCIUM 7.2* 03/17/2015 0306   PROT 7.4 03/12/2015 0037   ALBUMIN 2.3* 03/17/2015 0306   AST 36 03/12/2015 0037   ALT 6* 03/12/2015 0037   ALKPHOS 68 03/12/2015 0037   BILITOT 0.5 03/12/2015  0037   GFRNONAA 6* 03/17/2015 0306   GFRAA 7* 03/17/2015 0306       Problem List:  Patient Active Problem List   Diagnosis Date Noted  . Palliative care encounter 03/15/2015  . CAP (community acquired pneumonia) 03/11/2015  . Acute on chronic kidney failure ([place]) 03/11/2015  . Symptomatic anemia 03/11/2015  . Hypoxemia requiring supplemental oxygen 03/11/2015  . Hyperglycemia 03/11/2015  . Dementia with behavioral disturbance 12/07/2014  . Gait disorder 12/07/2014  . General weakness 12/07/2014  . Paroxysmal SVT  (supraventricular tachycardia) ([place]) 11/29/2014  . PAF (paroxysmal atrial fibrillation) ([place]) 11/29/2014  . Anemia, chronic disease 11/29/2014  . Acute diastolic heart failure, NYHA class 2 ([place]) 11/17/2014  . 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 QT prolongation 11/17/2014  . CKD (chronic kidney disease) stage 4, GFR 15-29 ml/min (HCC)   . Acute on chronic diastolic heart failure ([place])   . Metabolic acidosis 50/05/7046  . Acute respiratory failure with hypoxia ([place]) 11/13/2014  . COPD (chronic obstructive pulmonary disease) ([place]) 11/13/2014  . Malignant neoplasm of prostate ([place]) 07/15/2014  . GERD (gastroesophageal reflux disease) 02/17/2014  . Acromioclavicular joint arthritis 02/15/2014  . Impaired glucose tolerance 12/01/2013  . PD (Parkinson's disease) ([place]) 09/28/2013  . Chronic low back pain 09/01/2013  . 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 Recurrent falls 07/28/2013  . Chronic diastolic heart failure ([place]) 03/25/2013  . Anemia in CKD (chronic kidney disease) 03/25/2013  . Overactive bladder 11/23/2011  . Left knee pain 04/10/2011  . Bladder neck obstruction 04/10/2011  . Preventative health care 10/01/2010  . 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 Hepatitis C 03/28/2009  . Hyperlipidemia 03/28/2009  . Thrombocytopenia ([place]) 03/28/2009  . Anxiety state 03/28/2009  . Depression 03/28/2009  . Hypertensive heart disease with CHF ([place]) 03/28/2009  . PERIPHERAL VASCULAR DISEASE 03/28/2009  . ALLERGIC RHINITIS 03/28/2009  . DIVERTICULOSIS, COLON 03/28/2009  . CHOLELITHIASIS 03/28/2009  . Osteoarthritis 03/28/2009  . [place] DISEASE, LUMBAR 03/28/2009  . LOW BACK PAIN, CHRONIC 03/28/2009  . FATIGUE 03/28/2009  . Personal history of alcoholism ([place]) 03/28/2009     Palliative Care Assessment & Plan    1.Code Status:  DNR    Code Status Orders        Start     Ordered   03/16/15 1230  Do not attempt resuscitation (DNR)    Continuous    Question Answer Comment  In the event of cardiac or respiratory ARREST Do not call a "code blue"   In the event of cardiac or respiratory ARREST Do not perform Intubation, CPR, defibrillation or ACLS   In the event of cardiac or respiratory ARREST Use medication by any route, position, wound care, and other measures to relive pain and suffering. May use oxygen, suction and manual treatment of airway obstruction as needed for comfort.      03/16/15 1229       2. Goals of Care/Additional Recommendations:  No dialysis, DNR. Comfort focused care.   Limitations on Scope of Treatment: No Hemodialysis  Desire for further Chaplaincy support:no  Psycho-social Needs: Caregiving  Support/Resources  3. Symptom Management:      1. Dyspnea/pain: Fentanyl 12.5 mcg every 2 hours prn. Not currently needed but he could progress quickly.   4. Palliative Prophylaxis:   Bowel Regimen, Delirium Protocol, Frequent Pain Assessment and Oral Care  5. Prognosis: Likely hours to days. Did discuss this with wife today.   6. Discharge Planning:  Anticipate hospital death.  Thank you for allowing the Palliative Medicine Team to assist in the care of this patient.   Time In: 1120 Time Out: 1155 Total Time 11mn Prolonged Time Billed  no         APershing Proud NP  [id number] 11:51 AM  Please contact Palliative Medicine Team phone at [phone] questions and concerns.

## 2015-03-17 NOTE — Progress Notes (Signed)
Patient has been transferred from [facility] to [facility]. Oral handoff report provided to Unit CSW; she spoke briefly with patient's wife who continues to verbalize a need/desire to discuss with MD patient's condition and prognosis as she is unsure as to next step for patient.  Palliative has seen patient.  CSW services will monitor and and assist.  Butch Penny T. Pauline Good, [place]

## 2015-03-17 NOTE — Progress Notes (Signed)
[facility] PROGRESS NOTE  [name] [id number] DOB: 24-Aug-1940 DOA: 03/11/2015  PCP: [name], MD  Brief HPI: 74 year old African-American male with past medical history of idiopathic parkinsonism, mild dementia, diastolic heart failure, stage IV chronic kidney disease, thrombocytopenia, presented with complaints of shortness of breath. He was admitted to the hospital for possible community-acquired pneumonia. Also noted to have acute on chronic renal failure. Renal failure continued to get worse. Patient developed pulmonary edema requiring BiPAP. Patient thought to be a poor candidate for dialysis and had previously expressed a wish not to be dialyzed per his wife. In view of this palliative medicine was consulted.  Acute on chronic respiratory failure with hypoxemia Pulmonary edema noted on chest x-ray 12/18. Significant change compared to previous films. This is most likely due to fluid overload from his kidney failure. Patient had to be placed on BiPAP.  Also continue antibiotics for now as initial films had suggested pneumonitis. - Likely discontinue after 7 days of treatment. Day 6 of antibiotics.  -urine out put 825 . On IV lasix 160 mg IV every 6 hours/   Acute on chronic kidney failure with metabolic acidosis/hyperkalemia -Renal function continues to get worse. Nephrology is following. Also appreciated urology for placing Foley catheter. -Patient is now on high-dose Lasix.  -Patient is considered to be a poor candidate for dialysis.  -Renal ultrasound as above. No hydronephrosis noted.  -Palliative care following.  -After discussion with family, no Dialysis.  -renal function continue to get worse.  -continue with medical care, lasix, checking labs for now. Family would like to continue with  Checking  labs and medical care for now.  Transfer to med-surgery.  He is more sleepy today.  Anemia likely due to chronic kidney disease  He was transfused 1 unit of blood  at the time of admission. Patient is followed by nephrology as an outpatient and apparently gets marrow stimulating agents. No overt bleeding noted. Continue to monitor. S/P One unit PRBC transfusion 12-21.  Chronic Diastolic heart failure Initial CXR was negative for acute pulm edema. Chest x-ray from 12/18 shows diffuse pulmonary edema. Last 2D echo showed and LVEF of 65 - 70%.Continue strict ins and outs and daily weights. Mildly elevated troponins noted. Significance unclear, likely due to demand ischemia. Patient denies any chest pain. -On IV lasix.  -BIPAP PRN.   Hyperglycemia No documented history of diabetes.HbA1c is 6.1. Continue sliding scale coverage.  Idiopathic parkinsons with mild dementia Continue Sinemet IR.  Paroxysmal Atrial Fibrillation Currently in sinus rhythm. His episode of atrial fibrillation was back in August. CHADS2VASC his at least 4. However, patient has not been thought to be a candidate for anticoagulation due to compliance issues, thrombocytopenia. Continue Cardizem and metoprolol. Continue aspirin.  Chronic Thrombocytopenia Baseline appears to be in the 120s.Continue to monitor closely.No pharmacologic VTE prophylaxis. Stable.   DVT Prophylaxis: SCDs    Code Status: DNR Family Communication:  Care discussed with wife 12-21 Disposition Plan:Transfer to med-surgery   Consultants: Nephrology. Palliative medicine  Procedures: None  Antibiotics: Ceftriaxone and azithromycin 12/16  Subjective: More sleepy, eyes close, mumble.   Objective: Vital Signs  Filed Vitals:   03/16/15 2200 03/17/15 0000 03/17/15 0400 03/17/15 0600  BP: 138/83 112/71 141/87 142/93  Pulse: 82 82 92 95  Temp:  97.6 F (36.4 C) 97.5 F (36.4 C)   TempSrc:  Oral Oral   Resp: 12 9 16 12   Height:      Weight:    106.3 kg (  234 lb 5.6 oz)  SpO2: 97% 100% 99% 100%    Intake/Output Summary (Last 24 hours) at 03/17/15 0739 Last data filed at 03/17/15 [id number]  Gross per  24 hour  Intake    965 ml  Output    825 ml  Net    140 ml   Filed Weights   03/15/15 0500 03/16/15 0409 03/17/15 0600  Weight: 106.2 kg (234 lb 2.1 oz) 105.9 kg (233 lb 7.5 oz) 106.3 kg (234 lb 5.6 oz)    General appearance:  Sleepy, NAD Resp: Continues to have crackles at the bases. No rhonchi. No wheezing. Cardio: S1, S2 normal regular , no murmur, click, rub or gallop GI: soft, non-tender; bowel sounds normal; no masses,  no organomegaly Extremities: extremities normal, atraumatic, no cyanosis or edema  Lab Results:  Basic Metabolic Panel:  Recent Labs Lab 03/13/15 0321 03/14/15 0256 03/15/15 0240 03/16/15 0420 03/17/15 0306  NA 138 135 138 137 138  K 4.7 4.0 4.1 4.4 4.8  CL 108 102 103 101 100*  CO2 17* 16* 19* 16* 17*  GLUCOSE 149* 289* 251* 273* 263*  BUN 119* 147* 171* 203* 236*  CREATININE 5.08* 5.89* 6.43* 6.81* 7.30*  CALCIUM 8.3* 7.7* 7.4* 7.1* 7.2*  PHOS  --   --  10.1* 11.3* 7.8*   Liver Function Tests:  Recent Labs Lab 03/11/15 0712 03/12/15 0037 03/15/15 0240 03/16/15 0420 03/17/15 0306  AST 42* 36  --   --   --   ALT 12* 6*  --   --   --   ALKPHOS 75 68  --   --   --   BILITOT 0.2* 0.5  --   --   --   PROT 7.3 7.4  --   --   --   ALBUMIN 2.7* 2.6* 2.2* 2.1* 2.3*   CBC:  Recent Labs Lab 03/11/15 0712  03/13/15 0321 03/14/15 0256 03/15/15 0240 03/16/15 0420 03/16/15 2123 03/17/15 0306  WBC 9.5  < > 11.2* 10.1 7.4 9.1  --  8.6  NEUTROABS 7.9*  --   --   --   --   --   --   --   HGB 7.2*  < > 7.5* 7.6* 7.2* 6.7* 7.4* 7.4*  HCT 23.3*  < > 23.8* 24.5* 22.5* 21.1* 23.3* 22.7*  MCV 89.6  < > 87.5 87.2 86.5 85.8  --  84.4  PLT 90*  < > 103* 114* 109* 122*  --  113*  < > = values in this interval not displayed. Cardiac Enzymes:  Recent Labs Lab 03/11/15 0712 03/11/15 1845 03/12/15 0037 03/13/15 0947  CKTOTAL  --   --   --  2147*  TROPONINI 0.05* 0.08* 0.06*  --    BNP (last 3 results)  Recent Labs  11/14/14 1601  11/17/14 0636 03/11/15 0712  BNP 2334.6* 1430.0* 1299.4*   CBG:  Recent Labs Lab 03/15/15 2138 03/16/15 0759 03/16/15 1208 03/16/15 1735 03/16/15 2241  GLUCAP 310* 200* 257* 325* 313*    Recent Results (from the past 240 hour(s))  Culture, blood (Routine X 2) w Reflex to ID Panel     Status: None   Collection Time: 03/11/15  8:50 AM  Result Value Ref Range Status   Specimen Description BLOOD RIGHT ANTECUBITAL  Final   Special Requests BOTTLES DRAWN AEROBIC AND ANAEROBIC 10CC  Final   Culture NO GROWTH 5 DAYS  Final   Report Status 03/16/2015 FINAL  Final  Culture, blood (  Routine X 2) w Reflex to ID Panel     Status: None   Collection Time: 03/11/15  8:58 AM  Result Value Ref Range Status   Specimen Description BLOOD RIGHT HAND  Final   Special Requests BOTTLES DRAWN AEROBIC ONLY 10CC  Final   Culture NO GROWTH 5 DAYS  Final   Report Status 03/16/2015 FINAL  Final  MRSA PCR Screening     Status: None   Collection Time: 03/11/15  5:16 PM  Result Value Ref Range Status   MRSA by PCR NEGATIVE NEGATIVE Final    Comment:        The GeneXpert MRSA Assay (FDA approved for NASAL specimens only), is one component of a comprehensive MRSA colonization surveillance program. It is not intended to diagnose MRSA infection nor to guide or monitor treatment for MRSA infections.       Studies/Results: No results found.  Medications:  Scheduled: . aspirin  81 mg Oral Daily  . azithromycin  500 mg Oral Q24H  . carbidopa-levodopa  1 tablet Oral TID  . cefTRIAXone (ROCEPHIN)  IV  1 g Intravenous Q24H  . citalopram  10 mg Oral Daily  . cycloSPORINE  1 drop Both Eyes BID  . diltiazem  300 mg Oral Daily  . fluticasone  2 spray Each Nare Daily  . furosemide  160 mg Intravenous Q6H  . guaiFENesin  1,200 mg Oral BID  . insulin aspart  0-9 Units Subcutaneous TID WC  . levalbuterol  0.63 mg Nebulization TID  . metoprolol tartrate  25 mg Oral BID  . QUEtiapine  100 mg Oral QHS  .  sodium bicarbonate  1,300 mg Oral TID  . sodium chloride  3 mL Intravenous Q12H  . tamsulosin  0.4 mg Oral Daily   Continuous:   [id number] **OR** acetaminophen, alum & mag hydroxide-simeth, levalbuterol, ondansetron **OR** ondansetron (ZOFRAN) IV, senna-docusate  Assessment/Plan:  Principal Problem:   Hypoxemia requiring supplemental oxygen Active Problems:   Hepatitis C   Thrombocytopenia (HCC)   Hypertensive heart disease with CHF (HCC)   PERIPHERAL VASCULAR DISEASE   LOW BACK PAIN, CHRONIC   Chronic diastolic heart failure (HCC)   Neurologic gait disorder   PD (Parkinson's disease) ([place])   Malignant neoplasm of prostate (HCC)   COPD (chronic obstructive pulmonary disease) (HCC)   Metabolic acidosis   CKD (chronic kidney disease) stage 4, GFR 15-29 ml/min (HCC)   PAF (paroxysmal atrial fibrillation) (HCC)   CAP (community acquired pneumonia)   Acute on chronic kidney failure ([place])   Symptomatic anemia   Hyperglycemia   Palliative care encounter       LOS: 6 days   Niel Hummer A  [facility] Pager [phone] 03/17/2015, 7:39 AM  If 7PM-7AM, please contact night-coverage at [email or web address], password [facility]

## 2015-03-17 NOTE — Progress Notes (Signed)
S: Ate breakfast.  Denies any pain O:BP 143/80 mmHg  Pulse 91  Temp(Src) 97.1 F (36.2 C) (Axillary)  Resp 14  Ht 6' 0.5" (1.842 m)  Wt 106.3 kg (234 lb 5.6 oz)  BMI 31.33 kg/m2  SpO2 100%  Intake/Output Summary (Last 24 hours) at 03/17/15 1133 Last data filed at 03/17/15 0940  Gross per 24 hour  Intake    845 ml  Output    825 ml  Net     20 ml   Weight change: 0.4 kg (14.1 oz) YJE:HUDJS and alert but not very verbal CVS:RRR Resp:Basialr crackles Abd:+ BS NTND Ext: tr edema NEURO: CNI + asterixis   . aspirin  81 mg Oral Daily  . azithromycin  500 mg Oral Q24H  . carbidopa-levodopa  1 tablet Oral TID  . cefTRIAXone (ROCEPHIN)  IV  1 g Intravenous Q24H  . citalopram  10 mg Oral Daily  . cycloSPORINE  1 drop Both Eyes BID  . diltiazem  300 mg Oral Daily  . fluticasone  2 spray Each Nare Daily  . furosemide  160 mg Intravenous Q6H  . guaiFENesin  1,200 mg Oral BID  . insulin aspart  0-9 Units Subcutaneous TID WC  . levalbuterol  0.63 mg Nebulization TID  . metoprolol tartrate  25 mg Oral BID  . QUEtiapine  100 mg Oral QHS  . sodium bicarbonate  1,300 mg Oral TID  . sodium chloride  3 mL Intravenous Q12H  . tamsulosin  0.4 mg Oral Daily   No results found. BMET    Component Value Date/Time   NA 138 03/17/2015 0306   K 4.8 03/17/2015 0306   CL 100* 03/17/2015 0306   CO2 17* 03/17/2015 0306   GLUCOSE 263* 03/17/2015 0306   BUN 236* 03/17/2015 0306   CREATININE 7.30* 03/17/2015 0306   CALCIUM 7.2* 03/17/2015 0306   GFRNONAA 6* 03/17/2015 0306   GFRAA 7* 03/17/2015 0306   CBC    Component Value Date/Time   WBC 8.6 03/17/2015 0306   WBC 5.1 11/23/2008 1045   RBC 2.69* 03/17/2015 0306   RBC 5.05 11/23/2008 1045   HGB 7.4* 03/17/2015 0306   HGB 14.4 11/23/2008 1045   HCT 22.7* 03/17/2015 0306   HCT 43.1 11/23/2008 1045   PLT 113* 03/17/2015 0306   PLT 73* 11/23/2008 1045   MCV 84.4 03/17/2015 0306   MCV 85.3 11/23/2008 1045   MCH 27.5 03/17/2015 0306   MCH 28.5 11/23/2008 1045   MCHC 32.6 03/17/2015 0306   MCHC 33.4 11/23/2008 1045   RDW 15.3 03/17/2015 0306   RDW 13.2 11/23/2008 1045   LYMPHSABS 0.4* 03/11/2015 0712   LYMPHSABS 2.4 11/23/2008 1045   MONOABS 1.1* 03/11/2015 0712   MONOABS 0.6 11/23/2008 1045   EOSABS 0.0 03/11/2015 0712   EOSABS 0.2 11/23/2008 1045   BASOSABS 0.0 03/11/2015 0712   BASOSABS 0.0 11/23/2008 1045     Assessment: 1. Acute on CKD 4.  Scr rising with no plans to do HD.  UO is improving some 2. PNA vs pulm edema 3. A fib 4. Met acidosis 5. Anemia  Plan: 1. Cont with supportive care 2. He could be moved to regular floor 3. Cont lasix for now 4. Prognosis poor   Gerald Gomez

## 2015-03-18 ENCOUNTER — Encounter (HOSPITAL_COMMUNITY): Payer: Self-pay

## 2015-03-18 LAB — RENAL FUNCTION PANEL
ALBUMIN: 2.1 g/dL — AB (ref 3.5–5.0)
ANION GAP: 21 — AB (ref 5–15)
BUN: 263 mg/dL — AB (ref 6–20)
CALCIUM: 7.3 mg/dL — AB (ref 8.9–10.3)
CO2: 18 mmol/L — ABNORMAL LOW (ref 22–32)
CREATININE: 7.36 mg/dL — AB (ref 0.61–1.24)
Chloride: 101 mmol/L (ref 101–111)
GFR, EST AFRICAN AMERICAN: 7 mL/min — AB (ref >60)
GFR, EST NON AFRICAN AMERICAN: 6 mL/min — AB (ref >60)
Glucose, Bld: 191 mg/dL — ABNORMAL HIGH (ref 65–99)
POTASSIUM: 4.6 mmol/L (ref 3.5–5.1)
Phosphorus: 12.9 mg/dL — ABNORMAL HIGH (ref 2.5–4.6)
SODIUM: 140 mmol/L (ref 135–145)

## 2015-03-18 LAB — PREPARE RBC (CROSSMATCH)

## 2015-03-18 LAB — CBC
HCT: 20.3 % — ABNORMAL LOW (ref 39.0–52.0)
Hemoglobin: 6.6 g/dL — CL (ref 13.0–17.0)
MCH: 27.2 pg (ref 26.0–34.0)
MCHC: 32.5 g/dL (ref 30.0–36.0)
MCV: 83.5 fL (ref 78.0–100.0)
PLATELETS: 106 10*3/uL — AB (ref 150–400)
RBC: 2.43 MIL/uL — AB (ref 4.22–5.81)
RDW: 15.4 % (ref 11.5–15.5)
WBC: 10.3 10*3/uL (ref 4.0–10.5)

## 2015-03-18 LAB — GLUCOSE, CAPILLARY
GLUCOSE-CAPILLARY: 215 mg/dL — AB (ref 65–99)
Glucose-Capillary: 184 mg/dL — ABNORMAL HIGH (ref 65–99)
Glucose-Capillary: 232 mg/dL — ABNORMAL HIGH (ref 65–99)

## 2015-03-18 LAB — HEMOGLOBIN AND HEMATOCRIT, BLOOD
HCT: 25.2 % — ABNORMAL LOW (ref 39.0–52.0)
Hemoglobin: 8.3 g/dL — ABNORMAL LOW (ref 13.0–17.0)

## 2015-03-18 MED ORDER — LORAZEPAM 2 MG/ML IJ SOLN
0.5000 mg | Freq: Four times a day (QID) | INTRAMUSCULAR | Status: DC | PRN
  Administered 2015-03-18: 0.5 mg via INTRAVENOUS
  Filled 2015-03-18 (×2): qty 1

## 2015-03-18 MED ORDER — SODIUM CHLORIDE 0.9 % IV SOLN
Freq: Once | INTRAVENOUS | Status: AC
  Administered 2015-03-18: 11:00:00 via INTRAVENOUS

## 2015-03-18 MED ORDER — ACETAMINOPHEN 325 MG PO TABS: 650.0000 mg | ORAL_TABLET | Freq: Once | ORAL | Status: DC

## 2015-03-18 MED ORDER — FENTANYL CITRATE (PF) 100 MCG/2ML IJ SOLN: 12.5000 ug | INTRAMUSCULAR | Status: DC | PRN

## 2015-03-18 MED ORDER — WHITE PETROLATUM GEL
Status: AC
  Administered 2015-03-18: 0.2
  Filled 2015-03-18: qty 1

## 2015-03-18 MED ORDER — LEVALBUTEROL HCL 0.63 MG/3ML IN NEBU: 0.6300 mg | INHALATION_SOLUTION | Freq: Four times a day (QID) | RESPIRATORY_TRACT | Status: DC | PRN

## 2015-03-18 NOTE — Progress Notes (Signed)
[facility] PROGRESS NOTE  Gerald Gomez [id number] DOB: [birth year] DOA: 03/11/2015  PCP: [name], MD   Brief HPI: 74 year old African-American male with past medical history of idiopathic parkinsonism, mild dementia, diastolic heart failure, stage IV chronic kidney disease, thrombocytopenia, presented with complaints of shortness of breath. He was admitted to the hospital for possible community-acquired pneumonia. Also noted to have acute on chronic renal failure. Renal failure continued to get worse. Patient developed pulmonary edema requiring BiPAP. Patient thought to be a poor candidate for dialysis and had previously expressed a wish not to be dialyzed per his wife. In view of this palliative medicine was consulted.  Had detailed discussion with patient's wife bedside, I'm encouraging her to lean more towards full hospice as overall prognosis appears very poor. She will let me know in the next 24 hours.   Acute on chronic respiratory failure with hypoxemia Pulmonary edema noted on chest x-ray 12/18. Significant change compared to previous films. This is most likely due to fluid overload from his kidney failure. Patient had to be placed on BiPAP.  There was also suspicion for pneumonia and it has been treated adequately with antibiotics, stop all antibiotics on 03/18/2015. Continue On IV lasix 160 mg IV every 6 hours.  Acute on chronic kidney failure with metabolic acidosis/hyperkalemia -Renal function continues to get worse. Nephrology is following. Also appreciated urology for placing Foley catheter. -Patient is now on high-dose Lasix.  -Renal ultrasound was nonacute, renal is following and he is on high-dose IV Lasix. Renal function is not improving. Family has decided against dialysis and in favor of comfort care. Prognosis appears poor I have encouraged the family to consider full hospice they will let us know in 24 hours. Remains DO NOT RESUSCITATE.  Anemia likely due  to chronic kidney disease  He was transfused 1 unit of blood at the time of admission. Patient is followed by nephrology as an outpatient and apparently gets marrow stimulating agents. No overt bleeding noted. Continue to monitor.    Acute on Chronic Diastolic heart failure EF 70% on echogram. Continue high-dose IV Lasix as tolerated.  Hyperglycemia No documented history of diabetes.HbA1c is 6.1. Continue sliding scale coverage.  Idiopathic parkinsons with mild dementia Continue Sinemet IR.  Paroxysmal Atrial Fibrillation Currently in sinus rhythm. His episode of atrial fibrillation was back in August. CHADS2VASC his at least 4. However, patient has not been thought to be a candidate for anticoagulation due to compliance issues, thrombocytopenia. Continue Cardizem and metoprolol. Continue aspirin.  Chronic Thrombocytopenia Baseline appears to be in the 120s.Continue to monitor closely.No pharmacologic VTE prophylaxis. Stable.     DVT Prophylaxis: SCDs    Code Status: DNR Family Communication:  Care discussed with wife 12-23 Disposition Plan:Transfer to med-surgery   Consultants: Nephrology. Palliative medicine  Procedures: None  Antibiotics:  Anti-infectives    Start     Dose/Rate Route Frequency Ordered Stop   03/13/15 1600  azithromycin (ZITHROMAX) tablet 500 mg     500 mg Oral Every 24 hours 03/13/15 0714     03/12/15 1000  vancomycin (VANCOCIN) IVPB 1000 mg/200 mL premix  Status:  Discontinued     1,000 mg 200 mL/hr over 60 Minutes Intravenous Every 24 hours 03/11/15 1332 03/11/15 1547   03/11/15 1630  azithromycin (ZITHROMAX) 500 mg in dextrose 5 % 250 mL IVPB  Status:  Discontinued     500 mg 250 mL/hr over 60 Minutes Intravenous Every 24 hours 03/11/15 1526 03/13/15 0714   03/11/15  1600  cefTRIAXone (ROCEPHIN) 1 g in dextrose 5 % 50 mL IVPB     1 g 100 mL/hr over 30 Minutes Intravenous Every 24 hours 03/11/15 1526     03/11/15 0900  cefTAZidime (FORTAZ) 2 g  in dextrose 5 % 50 mL IVPB  Status:  Discontinued     2 g 100 mL/hr over 30 Minutes Intravenous Every 24 hours 03/11/15 0855 03/11/15 1546   03/11/15 0845  vancomycin (VANCOCIN) IVPB 1000 mg/200 mL premix     1,000 mg 200 mL/hr over 60 Minutes Intravenous  Once 03/11/15 0835 03/11/15 1137       Subjective:  More sleepy, eyes close, mumble. Does not appear to be in any dyscomfort  Objective: Vital Signs  Filed Vitals:   03/18/15 0836 03/18/15 1041 03/18/15 1136 03/18/15 1152  BP: 133/56 144/68 154/68 153/70  Pulse: 111 110 112 109  Temp: 97.7 F (36.5 C) 97.7 F (36.5 C) 97 F (36.1 C) 97.7 F (36.5 C)  TempSrc: Axillary Axillary Axillary Axillary  Resp: 20 20 20 20   Height:      Weight:      SpO2: 96% 96% 96% 96%    Intake/Output Summary (Last 24 hours) at 03/18/15 1246 Last data filed at 03/18/15 1030  Gross per 24 hour  Intake    385 ml  Output    900 ml  Net   -515 ml   Filed Weights   03/15/15 0500 03/16/15 0409 03/17/15 0600  Weight: 106.2 kg (234 lb 2.1 oz) 105.9 kg (233 lb 7.5 oz) 106.3 kg (234 lb 5.6 oz)    General appearance:  Sleepy, NAD Resp: Continues to have crackles at the bases. No rhonchi. No wheezing. Cardio: S1, S2 normal regular , no murmur, click, rub or gallop GI: soft, non-tender; bowel sounds normal; no masses,  no organomegaly Extremities: extremities normal, atraumatic, no cyanosis or edema  Lab Results:  Basic Metabolic Panel:  Recent Labs Lab 03/14/15 0256 03/15/15 0240 03/16/15 0420 03/17/15 0306 03/18/15 0542  NA 135 138 137 138 140  K 4.0 4.1 4.4 4.8 4.6  CL 102 103 101 100* 101  CO2 16* 19* 16* 17* 18*  GLUCOSE 289* 251* 273* 263* 191*  BUN 147* 171* 203* 236* 263*  CREATININE 5.89* 6.43* 6.81* 7.30* 7.36*  CALCIUM 7.7* 7.4* 7.1* 7.2* 7.3*  PHOS  --  10.1* 11.3* 7.8* 12.9*   Liver Function Tests:  Recent Labs Lab 03/12/15 0037 03/15/15 0240 03/16/15 0420 03/17/15 0306 03/18/15 0542  AST 36  --   --   --    --   ALT 6*  --   --   --   --   ALKPHOS 68  --   --   --   --   BILITOT 0.5  --   --   --   --   PROT 7.4  --   --   --   --   ALBUMIN 2.6* 2.2* 2.1* 2.3* 2.1*   CBC:  Recent Labs Lab 03/14/15 0256 03/15/15 0240 03/16/15 0420 03/16/15 2123 03/17/15 0306 03/18/15 0542  WBC 10.1 7.4 9.1  --  8.6 10.3  HGB 7.6* 7.2* 6.7* 7.4* 7.4* 6.6*  HCT 24.5* 22.5* 21.1* 23.3* 22.7* 20.3*  MCV 87.2 86.5 85.8  --  84.4 83.5  PLT 114* 109* 122*  --  113* 106*   Cardiac Enzymes:  Recent Labs Lab 03/11/15 1845 03/12/15 0037 03/13/15 0947  CKTOTAL  --   --  2147*  TROPONINI 0.08* 0.06*  --    BNP (last 3 results)  Recent Labs  11/14/14 1601 11/17/14 0636 03/11/15 0712  BNP 2334.6* 1430.0* 1299.4*   CBG:  Recent Labs Lab 03/17/15 1315 03/17/15 1654 03/17/15 2117 03/18/15 0758 03/18/15 1223  GLUCAP 185* 217* 183* 184* 232*    Recent Results (from the past 240 hour(s))  Culture, blood (Routine X 2) w Reflex to ID Panel     Status: None   Collection Time: 03/11/15  8:50 AM  Result Value Ref Range Status   Specimen Description BLOOD RIGHT ANTECUBITAL  Final   Special Requests BOTTLES DRAWN AEROBIC AND ANAEROBIC 10CC  Final   Culture NO GROWTH 5 DAYS  Final   Report Status 03/16/2015 FINAL  Final  Culture, blood (Routine X 2) w Reflex to ID Panel     Status: None   Collection Time: 03/11/15  8:58 AM  Result Value Ref Range Status   Specimen Description BLOOD RIGHT HAND  Final   Special Requests BOTTLES DRAWN AEROBIC ONLY 10CC  Final   Culture NO GROWTH 5 DAYS  Final   Report Status 03/16/2015 FINAL  Final  MRSA PCR Screening     Status: None   Collection Time: 03/11/15  5:16 PM  Result Value Ref Range Status   MRSA by PCR NEGATIVE NEGATIVE Final    Comment:        The GeneXpert MRSA Assay (FDA approved for NASAL specimens only), is one component of a comprehensive MRSA colonization surveillance program. It is not intended to diagnose MRSA infection nor to guide  or monitor treatment for MRSA infections.       Studies/Results: No results found.  Medications:  Scheduled: . acetaminophen  650 mg Oral Once  . aspirin  81 mg Oral Daily  . azithromycin  500 mg Oral Q24H  . carbidopa-levodopa  1 tablet Oral TID  . cefTRIAXone (ROCEPHIN)  IV  1 g Intravenous Q24H  . citalopram  10 mg Oral Daily  . cycloSPORINE  1 drop Both Eyes BID  . diltiazem  300 mg Oral Daily  . fluticasone  2 spray Each Nare Daily  . furosemide  160 mg Intravenous Q6H  . guaiFENesin  1,200 mg Oral BID  . insulin aspart  0-9 Units Subcutaneous TID WC  . levalbuterol  0.63 mg Nebulization TID  . metoprolol tartrate  25 mg Oral BID  . QUEtiapine  50 mg Oral QHS  . sodium bicarbonate  1,300 mg Oral TID  . sodium chloride  3 mL Intravenous Q12H  . tamsulosin  0.4 mg Oral Daily   Continuous:   [id number] **OR** acetaminophen, alum & mag hydroxide-simeth, fentaNYL (SUBLIMAZE) injection, glycopyrrolate, levalbuterol, LORazepam, ondansetron **OR** ondansetron (ZOFRAN) IV, senna-docusate  Assessment/Plan:  Principal Problem:   Hypoxemia requiring supplemental oxygen Active Problems:   Hepatitis C   Thrombocytopenia (HCC)   Hypertensive heart disease with CHF (HCC)   PERIPHERAL VASCULAR DISEASE   LOW BACK PAIN, CHRONIC   Chronic diastolic heart failure (HCC)   Neurologic gait disorder   PD (Parkinson's disease) (HCC)   Malignant neoplasm of prostate (HCC)   COPD (chronic obstructive pulmonary disease) (HCC)   Metabolic acidosis   CKD (chronic kidney disease) stage 4, GFR 15-29 ml/min (HCC)   PAF (paroxysmal atrial fibrillation) (HCC)   CAP (community acquired pneumonia)   Acute on chronic kidney failure (HCC)   Symptomatic anemia   Hyperglycemia   Palliative care encounter    LOS: 7 days  Thurnell Lose  [facility] Pager [phone]  03/18/2015, 12:46 PM  If 7PM-7AM, please contact night-coverage at [email or web address], password [facility]

## 2015-03-18 NOTE — Progress Notes (Signed)
I see plans for comfort care with no dialysis which is appropriate and consistent with conversations held.  Renal will sign off, I agree life expectancy is days,  call if we can be of any assistance  Rudolpho Claxton A

## 2015-03-18 NOTE — Care Management Important Message (Signed)
Important Message  Patient Details  Name: Gerald Gomez MRN: [id number] Date of Birth: [birth year]   Medicare Important Message Given:  Yes    Fonda Rochon Abena 03/18/2015, 12:00 PM

## 2015-03-18 NOTE — Progress Notes (Signed)
CRITICAL VALUE ALERT  Critical value received:  Hgb 6.6  Date of notification:  03/18/2015  Time of notification:  6:53 AM  Critical value read back:Yes.    Nurse who received alert:  Will Bonnet  MD notified (1st page):  [place]  Time of first page:  6:53 AM  Responding MD:  Rachelle Hora  Time MD responded:  7:01 AM

## 2015-03-18 NOTE — Progress Notes (Signed)
PT Cancellation Note  Patient Details Name: Gerald Gomez MRN: [id number] DOB: 1941/03/11   Cancelled Treatment:     HgB 6.6, creatinine 7.36 plus increased lethargy.  Pt unable to participate in Acute PT today.  Will continue to follow.   Nathanial Rancher 03/18/2015, 8:22 AM

## 2015-03-19 DIAGNOSIS — N179 Acute kidney failure, unspecified: Secondary | ICD-10-CM | POA: Insufficient documentation

## 2015-03-19 LAB — TYPE AND SCREEN
ABO/RH(D): O POS
ANTIBODY SCREEN: NEGATIVE
UNIT DIVISION: 0
Unit division: 0
Unit division: 0

## 2015-03-19 LAB — GLUCOSE, CAPILLARY
GLUCOSE-CAPILLARY: 183 mg/dL — AB (ref 65–99)
GLUCOSE-CAPILLARY: 191 mg/dL — AB (ref 65–99)
GLUCOSE-CAPILLARY: 187 mg/dL — AB (ref 65–99)
Glucose-Capillary: 254 mg/dL — ABNORMAL HIGH (ref 65–99)

## 2015-03-19 MED ORDER — HALOPERIDOL 5 MG PO TABS: 5.0000 mg | ORAL_TABLET | Freq: Four times a day (QID) | ORAL | Status: AC | PRN

## 2015-03-19 MED ORDER — HYDROMORPHONE HCL 1 MG/ML PO LIQD: 1.0000 mg | ORAL | Status: AC | PRN

## 2015-03-19 MED ORDER — FUROSEMIDE 40 MG PO TABS: 80.0000 mg | ORAL_TABLET | Freq: Two times a day (BID) | ORAL | Status: AC

## 2015-03-19 MED ORDER — HYDROMORPHONE HCL 2 MG PO TABS: 1.0000 mg | ORAL_TABLET | ORAL | Status: DC | PRN

## 2015-03-19 MED ORDER — HYDROMORPHONE HCL 1 MG/ML PO LIQD: 1.0000 mg | ORAL | Status: DC | PRN

## 2015-03-19 MED ORDER — LORAZEPAM 0.5 MG PO TABS
0.5000 mg | ORAL_TABLET | ORAL | Status: DC | PRN
  Administered 2015-03-19: 0.5 mg via ORAL
  Administered 2015-03-20 – 2015-03-21 (×2): 1 mg via ORAL
  Filled 2015-03-19: qty 1
  Filled 2015-03-19 (×2): qty 2
  Filled 2015-03-19: qty 1

## 2015-03-19 MED ORDER — FUROSEMIDE 80 MG PO TABS
80.0000 mg | ORAL_TABLET | Freq: Three times a day (TID) | ORAL | Status: DC
  Administered 2015-03-19 – 2015-03-21 (×7): 80 mg via ORAL
  Filled 2015-03-19 (×7): qty 1

## 2015-03-19 MED ORDER — LORAZEPAM 2 MG/ML PO CONC: 1.0000 mg | Freq: Four times a day (QID) | ORAL | Status: AC | PRN

## 2015-03-19 MED ORDER — HALOPERIDOL 5 MG PO TABS
5.0000 mg | ORAL_TABLET | Freq: Four times a day (QID) | ORAL | Status: DC | PRN
  Filled 2015-03-19: qty 1

## 2015-03-19 NOTE — Progress Notes (Signed)
Daily Progress Note   Patient Name: Gerald Gomez       Date: 03/19/2015 DOB: 02/02/1941  Age: 74 y.o. MRN#: [id number] Attending Physician: Thurnell Lose, MD Primary Care Physician: [name], MD Admit Date: 03/11/2015  Reason for Consultation/Follow-up: Establishing goals of care, Hospice Evaluation, Inpatient hospice referral, Non pain symptom management and Pain control  Subjective: Pt non verbal. Just mumbles. Appears restless, agitated. Pt has lost his IV access and is still taking po meds crushed but concerned he will not be able to swallow much longer. Spouse is still struggling with disposition to SNF vs. In-patient hospice. She recognizes he is dying but stops when it comes time to decide what the next step is once he is medically ready for DC. Interval Events: Clinical decline Length of Stay: 8 days  Current Medications: Scheduled Meds:  . carbidopa-levodopa  1 tablet Oral TID  . cycloSPORINE  1 drop Both Eyes BID  . diltiazem  300 mg Oral Daily  . furosemide  80 mg Oral TID  . insulin aspart  0-9 Units Subcutaneous TID WC  . QUEtiapine  50 mg Oral QHS  . sodium chloride  3 mL Intravenous Q12H    Continuous Infusions:    PRN Meds: acetaminophen **OR** acetaminophen, alum & mag hydroxide-simeth, glycopyrrolate, haloperidol, HYDROmorphone, levalbuterol, LORazepam, ondansetron **OR** ondansetron (ZOFRAN) IV, senna-docusate  Physical Exam: Physical Exam  Constitutional:  Older acutely ill appearing man  HENT:  Head: Normocephalic.  Cardiovascular:  irrg  Pulmonary/Chest:  Increased work of breathing  Neurological:  Lethargic, encephalopathic  Skin: Skin is warm.  Psychiatric:  Agitated, restless                Vital Signs: BP 147/63 mmHg  Pulse 81   Temp(Src) 97.4 F (36.3 C) (Oral)  Resp 18  Ht 6' 0.5" (1.842 m)  Wt 106.3 kg (234 lb 5.6 oz)  BMI 31.33 kg/m2  SpO2 98% SpO2: SpO2: 98 % O2 Device: O2 Device: Nasal Cannula O2 Flow Rate: O2 Flow Rate (L/min): 3 L/min  Intake/output summary:  Intake/Output Summary (Last 24 hours) at 03/19/15 1329 Last data filed at 03/19/15 1235  Gross per 24 hour  Intake    132 ml  Output   2100 ml  Net  -1968 ml   LBM: Last BM Date:  (  PTA) Baseline Weight: Weight: 97.268 kg (214 lb 7 oz) Most recent weight: Weight: 106.3 kg (234 lb 5.6 oz)       Palliative Assessment/Data: Flowsheet Rows        Most Recent Value   Intake Tab    Referral Department  Hospitalist   Unit at Time of Referral  Intermediate Care Unit   Palliative Care Primary Diagnosis  Neurology   Date Notified  03/15/15   Palliative Care Type  New Palliative care   Reason for referral  Clarify Goals of Care   Date of Admission  03/11/15   Date first seen by Palliative Care  03/15/15   # of days Palliative referral response time  0 Day(s)   # of days IP prior to Palliative referral  4   Clinical Assessment    Psychosocial & Spiritual Assessment    Palliative Care Outcomes       Additional Data Reviewed: CBC    Component Value Date/Time   WBC 10.3 03/18/2015 0542   WBC 5.1 11/23/2008 1045   RBC 2.43* 03/18/2015 0542   RBC 5.05 11/23/2008 1045   HGB 8.3* 03/18/2015 1629   HGB 14.4 11/23/2008 1045   HCT 25.2* 03/18/2015 1629   HCT 43.1 11/23/2008 1045   PLT 106* 03/18/2015 0542   PLT 73* 11/23/2008 1045   MCV 83.5 03/18/2015 0542   MCV 85.3 11/23/2008 1045   MCH 27.2 03/18/2015 0542   MCH 28.5 11/23/2008 1045   MCHC 32.5 03/18/2015 0542   MCHC 33.4 11/23/2008 1045   RDW 15.4 03/18/2015 0542   RDW 13.2 11/23/2008 1045   LYMPHSABS 0.4* 03/11/2015 0712   LYMPHSABS 2.4 11/23/2008 1045   MONOABS 1.1* 03/11/2015 0712   MONOABS 0.6 11/23/2008 1045   EOSABS 0.0 03/11/2015 0712   EOSABS 0.2 11/23/2008 1045    BASOSABS 0.0 03/11/2015 0712   BASOSABS 0.0 11/23/2008 1045    CMP     Component Value Date/Time   NA 140 03/18/2015 0542   K 4.6 03/18/2015 0542   CL 101 03/18/2015 0542   CO2 18* 03/18/2015 0542   GLUCOSE 191* 03/18/2015 0542   BUN 263* 03/18/2015 0542   CREATININE 7.36* 03/18/2015 0542   CALCIUM 7.3* 03/18/2015 0542   PROT 7.4 03/12/2015 0037   ALBUMIN 2.1* 03/18/2015 0542   AST 36 03/12/2015 0037   ALT 6* 03/12/2015 0037   ALKPHOS 68 03/12/2015 0037   BILITOT 0.5 03/12/2015 0037   GFRNONAA 6* 03/18/2015 0542   GFRAA 7* 03/18/2015 0542       Problem List:  Patient Active Problem List   Diagnosis Date Noted  . Palliative care encounter 03/15/2015  . CAP (community acquired pneumonia) 03/11/2015  . Acute on chronic kidney failure ([place]) 03/11/2015  . Symptomatic anemia 03/11/2015  . Hypoxemia requiring supplemental oxygen 03/11/2015  . Hyperglycemia 03/11/2015  . Dementia with behavioral disturbance 12/07/2014  . Gait disorder 12/07/2014  . General weakness 12/07/2014  . Paroxysmal SVT (supraventricular tachycardia) ([place]) 11/29/2014  . PAF (paroxysmal atrial fibrillation) ([place]) 11/29/2014  . Anemia, chronic disease 11/29/2014  . Acute diastolic heart failure, NYHA class 2 ([place]) 11/17/2014  . HOCM (hypertrophic obstructive cardiomyopathy) ([place]) 11/17/2014  . QT prolongation 11/17/2014  . CKD (chronic kidney disease) stage 4, GFR 15-29 ml/min (HCC)   . Acute on chronic diastolic heart failure ([place])   . Metabolic acidosis [id number]  . Acute respiratory failure with hypoxia ([place]) 11/13/2014  . COPD (chronic obstructive pulmonary disease) ([place]) 11/13/2014  .  Malignant neoplasm of prostate ([place]) 07/15/2014  . GERD (gastroesophageal reflux disease) 02/17/2014  . Acromioclavicular joint arthritis 02/15/2014  . Impaired glucose tolerance 12/01/2013  . PD (Parkinson's disease) ([place]) 09/28/2013  . Chronic low back pain 09/01/2013  . Neurologic gait disorder 09/01/2013    . Recurrent falls 07/28/2013  . Chronic diastolic heart failure ([place]) 03/25/2013  . Anemia in CKD (chronic kidney disease) 03/25/2013  . Overactive bladder 11/23/2011  . Left knee pain 04/10/2011  . Bladder neck obstruction 04/10/2011  . Preventative health care 10/01/2010  . 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 Hepatitis C 03/28/2009  . Hyperlipidemia 03/28/2009  . Thrombocytopenia ([place]) 03/28/2009  . Anxiety state 03/28/2009  . Depression 03/28/2009  . Hypertensive heart disease with CHF ([place]) 03/28/2009  . PERIPHERAL VASCULAR DISEASE 03/28/2009  . ALLERGIC RHINITIS 03/28/2009  . DIVERTICULOSIS, COLON 03/28/2009  . CHOLELITHIASIS 03/28/2009  . Osteoarthritis 03/28/2009  . [place] DISEASE, LUMBAR 03/28/2009  . LOW BACK PAIN, CHRONIC 03/28/2009  . FATIGUE 03/28/2009  . Personal history of alcoholism ([place]) 03/28/2009     Palliative Care Assessment & Plan    1.Code Status:  DNR    Code Status Orders        Start     Ordered   03/16/15 1230  Do not attempt resuscitation (DNR)   Continuous    Question Answer Comment  In the event of cardiac or respiratory ARREST Do not call a "code blue"   In the event of cardiac or respiratory ARREST Do not perform Intubation, CPR, defibrillation or ACLS   In the event of cardiac or respiratory ARREST Use medication by any route, position, wound care, and other measures to relive pain and suffering. May use oxygen, suction and manual treatment of airway obstruction as needed for comfort.      03/16/15 1229       2. Goals of Care/Additional Recommendations:  Strongly recommended in-patient hospice for wife to consider. She appears closer to considering this vs SNF but still asking for time. Medical maximization still on-going  Will place consult to SW to present hospice for wife to consider  Limitations on Scope of Treatment: No Hemodialysis  Desire for further Chaplaincy support:no  Psycho-social Needs: Grief/Bereavement  Support  3. Symptom Management:      1.Pain: Pt has lost IV access. Will switch to po/SL dilaudid       2. Dyspnea: See above. Continue 02       3. Agitation: Cont haldol and ativan. Will leave PO orders  4. Palliative Prophylaxis:   Aspiration, Bowel Regimen, Delirium Protocol, Frequent Pain Assessment and Turn Reposition  5. Prognosis: Hours - Days  6. Discharge Planning:  SNF vs. In-patient hospice once medically ready for dc. Wife leaning towards hospice as of this afternoon   Care plan was discussed with Dr. Candiss Norse  Thank you for allowing the Palliative Medicine Team to assist in the care of this patient.   Time In: 1300 Time Out: 1325 Total Time 25 min Prolonged Time Billed  no         Dory Horn, NP  03/19/2015, 1:29 PM  Please contact Palliative Medicine Team phone at [phone] for questions and concerns.

## 2015-03-19 NOTE — Progress Notes (Signed)
[facility] PROGRESS NOTE  Gerald Gomez [id number] DOB: 09-15-1940 DOA: 03/11/2015  PCP: [name], MD   Brief HPI: 74 year old African-American male with past medical history of idiopathic parkinsonism, mild dementia, diastolic heart failure, stage IV chronic kidney disease, thrombocytopenia, presented with complaints of shortness of breath. He was admitted to the hospital for possible community-acquired pneumonia. Also noted to have acute on chronic renal failure. Renal failure continued to get worse. Patient developed pulmonary edema requiring BiPAP. Patient thought to be a poor candidate for dialysis and had previously expressed a wish not to be dialyzed per his wife. In view of this palliative medicine was consulted.  Had detailed discussion with patient's wife bedside, I'm encouraging her to lean more towards full hospice as overall prognosis appears very poor. She will decide doing residential hospice and SNF with comfort care, She will let me know in the next 24-48 hours.   Acute on chronic respiratory failure with hypoxemia Pulmonary edema noted on chest x-ray 12/18. Significant change compared to previous films. This is most likely due to fluid overload from his kidney failure. Patient had to be placed on BiPAP.  There was also suspicion for pneumonia and it has been treated adequately with antibiotics, stopped all antibiotics on 03/18/2015. Has a shunt to oral Lasix as tolerated, goal of care now medical treatment directed towards comfort only no heroics. No dialysis. DO NOT RESUSCITATE.  Acute on chronic kidney failure with metabolic acidosis/hyperkalemia -Renal function continues to get worse. Nephrology is following. Also appreciated urology for placing Foley catheter. -Patient is now on high-dose Lasix.  -Renal ultrasound was nonacute, renal is following and he was on high-dose IV Lasix and will switch to oral Lasix. Renal function is not improving. Family has decided  against dialysis and in favor of comfort care. Prognosis appears poor I have encouraged the family to consider full hospice they will let us know in 24 hours. Remains DO NOT RESUSCITATE.  Anemia likely due to chronic kidney disease  He was transfused 1 unit of blood at the time of admission. Patient is followed by nephrology as an outpatient and apparently gets marrow stimulating agents. No overt bleeding noted. Continue to monitor.    Acute on Chronic Diastolic heart failure EF 70% on echogram. Continue high-dose IV Lasix as tolerated.  Hyperglycemia No documented history of diabetes.HbA1c is 6.1. Continue sliding scale coverage.  Idiopathic parkinsons with mild dementia Continue Sinemet IR.  Paroxysmal Atrial Fibrillation Currently in sinus rhythm. His episode of atrial fibrillation was back in August. CHADS2VASC his at least 4. However, patient has not been thought to be a candidate for anticoagulation due to compliance issues, thrombocytopenia. Continue Cardizem and metoprolol. Continue aspirin.  Chronic Thrombocytopenia Baseline appears to be in the 120s.Continue to monitor closely.No pharmacologic VTE prophylaxis. Stable.     DVT Prophylaxis: SCDs    Code Status: DNR Family Communication:  Care discussed with wife on 03/18/2015 and 03/19/2015 Disposition Plan: SNF with comfort care or residential hospice wife wants to take a few days to decide   Consultants: Nephrology. Palliative medicine  Procedures: None  Antibiotics:  Anti-infectives    Start     Dose/Rate Route Frequency Ordered Stop   03/13/15 1600  azithromycin (ZITHROMAX) tablet 500 mg  Status:  Discontinued     500 mg Oral Every 24 hours 03/13/15 0714 03/18/15 1249   03/12/15 1000  vancomycin (VANCOCIN) IVPB 1000 mg/200 mL premix  Status:  Discontinued     1,000 mg 200 mL/hr  over 60 Minutes Intravenous Every 24 hours 03/11/15 1332 03/11/15 1547   03/11/15 1630  azithromycin (ZITHROMAX) 500 mg in dextrose  5 % 250 mL IVPB  Status:  Discontinued     500 mg 250 mL/hr over 60 Minutes Intravenous Every 24 hours 03/11/15 1526 03/13/15 0714   03/11/15 1600  cefTRIAXone (ROCEPHIN) 1 g in dextrose 5 % 50 mL IVPB  Status:  Discontinued     1 g 100 mL/hr over 30 Minutes Intravenous Every 24 hours 03/11/15 1526 03/18/15 1249   03/11/15 0900  cefTAZidime (FORTAZ) 2 g in dextrose 5 % 50 mL IVPB  Status:  Discontinued     2 g 100 mL/hr over 30 Minutes Intravenous Every 24 hours 03/11/15 0855 03/11/15 1546   03/11/15 0845  vancomycin (VANCOCIN) IVPB 1000 mg/200 mL premix     1,000 mg 200 mL/hr over 60 Minutes Intravenous  Once 03/11/15 0835 03/11/15 1137       Subjective:  More sleepy, eyes close, mumble. Does not appear to be in any dyscomfort  Objective: Vital Signs  Filed Vitals:   03/18/15 1422 03/18/15 2202 03/19/15 0534 03/19/15 0918  BP: 135/83 144/88 133/87 147/63  Pulse: 100 121 107 81  Temp: 98.1 F (36.7 C) 97.5 F (36.4 C) 97.4 F (36.3 C)   TempSrc: Oral Oral Oral   Resp: 22 18 18    Height:      Weight:      SpO2: 99% 100% 98%     Intake/Output Summary (Last 24 hours) at 03/19/15 1020 Last data filed at 03/19/15 0535  Gross per 24 hour  Intake    632 ml  Output   1700 ml  Net  -1068 ml   Filed Weights   03/15/15 0500 03/16/15 0409 03/17/15 0600  Weight: 106.2 kg (234 lb 2.1 oz) 105.9 kg (233 lb 7.5 oz) 106.3 kg (234 lb 5.6 oz)    General appearance:  Sleepy, NAD Resp: Continues to have crackles at the bases. No rhonchi. No wheezing. Cardio: S1, S2 normal regular , no murmur, click, rub or gallop GI: soft, non-tender; bowel sounds normal; no masses,  no organomegaly Extremities: extremities normal, atraumatic, no cyanosis or edema  Lab Results:  Basic Metabolic Panel:  Recent Labs Lab 03/14/15 0256 03/15/15 0240 03/16/15 0420 03/17/15 0306 03/18/15 0542  NA 135 138 137 138 140  K 4.0 4.1 4.4 4.8 4.6  CL 102 103 101 100* 101  CO2 16* 19* 16* 17* 18*   GLUCOSE 289* 251* 273* 263* 191*  BUN 147* 171* 203* 236* 263*  CREATININE 5.89* 6.43* 6.81* 7.30* 7.36*  CALCIUM 7.7* 7.4* 7.1* 7.2* 7.3*  PHOS  --  10.1* 11.3* 7.8* 12.9*   Liver Function Tests:  Recent Labs Lab 03/15/15 0240 03/16/15 0420 03/17/15 0306 03/18/15 0542  ALBUMIN 2.2* 2.1* 2.3* 2.1*   CBC:  Recent Labs Lab 03/14/15 0256 03/15/15 0240 03/16/15 0420 03/16/15 2123 03/17/15 0306 03/18/15 0542 03/18/15 1629  WBC 10.1 7.4 9.1  --  8.6 10.3  --   HGB 7.6* 7.2* 6.7* 7.4* 7.4* 6.6* 8.3*  HCT 24.5* 22.5* 21.1* 23.3* 22.7* 20.3* 25.2*  MCV 87.2 86.5 85.8  --  84.4 83.5  --   PLT 114* 109* 122*  --  113* 106*  --    Cardiac Enzymes:  Recent Labs Lab 03/13/15 0947  CKTOTAL 2147*   BNP (last 3 results)  Recent Labs  11/14/14 1601 11/17/14 0636 03/11/15 0712  BNP 2334.6* 1430.0* 1299.4*  CBG:  Recent Labs Lab 03/17/15 2117 03/18/15 0758 03/18/15 1223 03/18/15 1636 03/19/15 0742  GLUCAP 183* 184* 232* 215* 183*    Recent Results (from the past 240 hour(s))  Culture, blood (Routine X 2) w Reflex to ID Panel     Status: None   Collection Time: 03/11/15  8:50 AM  Result Value Ref Range Status   Specimen Description BLOOD RIGHT ANTECUBITAL  Final   Special Requests BOTTLES DRAWN AEROBIC AND ANAEROBIC 10CC  Final   Culture NO GROWTH 5 DAYS  Final   Report Status 03/16/2015 FINAL  Final  Culture, blood (Routine X 2) w Reflex to ID Panel     Status: None   Collection Time: 03/11/15  8:58 AM  Result Value Ref Range Status   Specimen Description BLOOD RIGHT HAND  Final   Special Requests BOTTLES DRAWN AEROBIC ONLY 10CC  Final   Culture NO GROWTH 5 DAYS  Final   Report Status 03/16/2015 FINAL  Final  MRSA PCR Screening     Status: None   Collection Time: 03/11/15  5:16 PM  Result Value Ref Range Status   MRSA by PCR NEGATIVE NEGATIVE Final    Comment:        The GeneXpert MRSA Assay (FDA approved for NASAL specimens only), is one component of  a comprehensive MRSA colonization surveillance program. It is not intended to diagnose MRSA infection nor to guide or monitor treatment for MRSA infections.       Studies/Results: No results found.  Medications:  Scheduled: . aspirin  81 mg Oral Daily  . carbidopa-levodopa  1 tablet Oral TID  . citalopram  10 mg Oral Daily  . cycloSPORINE  1 drop Both Eyes BID  . diltiazem  300 mg Oral Daily  . fluticasone  2 spray Each Nare Daily  . furosemide  160 mg Intravenous Q6H  . guaiFENesin  1,200 mg Oral BID  . insulin aspart  0-9 Units Subcutaneous TID WC  . metoprolol tartrate  25 mg Oral BID  . QUEtiapine  50 mg Oral QHS  . sodium bicarbonate  1,300 mg Oral TID  . sodium chloride  3 mL Intravenous Q12H  . tamsulosin  0.4 mg Oral Daily   Continuous:   [id number] **OR** acetaminophen, alum & mag hydroxide-simeth, fentaNYL (SUBLIMAZE) injection, glycopyrrolate, levalbuterol, LORazepam, ondansetron **OR** ondansetron (ZOFRAN) IV, senna-docusate  Assessment/Plan:  Principal Problem:   Hypoxemia requiring supplemental oxygen Active Problems:   Hepatitis C   Thrombocytopenia (HCC)   Hypertensive heart disease with CHF (HCC)   PERIPHERAL VASCULAR DISEASE   LOW BACK PAIN, CHRONIC   Chronic diastolic heart failure (HCC)   Neurologic gait disorder   PD (Parkinson's disease) (HCC)   Malignant neoplasm of prostate (HCC)   COPD (chronic obstructive pulmonary disease) (HCC)   Metabolic acidosis   CKD (chronic kidney disease) stage 4, GFR 15-29 ml/min (HCC)   PAF (paroxysmal atrial fibrillation) (HCC)   CAP (community acquired pneumonia)   Acute on chronic kidney failure ([place])   Symptomatic anemia   Hyperglycemia   Palliative care encounter    LOS: 8 days   Thurnell Lose  [facility] Pager [phone]  03/19/2015, 10:20 AM  If 7PM-7AM, please contact night-coverage at [email or web address], password [facility]

## 2015-03-20 LAB — BASIC METABOLIC PANEL
ANION GAP: 24 — AB (ref 5–15)
BUN: >300 mg/dL — ABNORMAL HIGH (ref 6–20)
CALCIUM: 7.8 mg/dL — AB (ref 8.9–10.3)
CO2: 17 mmol/L — AB (ref 22–32)
Chloride: 105 mmol/L (ref 101–111)
Creatinine, Ser: 7.89 mg/dL — ABNORMAL HIGH (ref 0.61–1.24)
GFR, EST AFRICAN AMERICAN: 7 mL/min — AB (ref >60)
GFR, EST NON AFRICAN AMERICAN: 6 mL/min — AB (ref >60)
Glucose, Bld: 193 mg/dL — ABNORMAL HIGH (ref 65–99)
Potassium: 4.9 mmol/L (ref 3.5–5.1)
SODIUM: 146 mmol/L — AB (ref 135–145)

## 2015-03-20 LAB — GLUCOSE, CAPILLARY
GLUCOSE-CAPILLARY: 181 mg/dL — AB (ref 65–99)
GLUCOSE-CAPILLARY: 198 mg/dL — AB (ref 65–99)
GLUCOSE-CAPILLARY: 223 mg/dL — AB (ref 65–99)
GLUCOSE-CAPILLARY: 237 mg/dL — AB (ref 65–99)
GLUCOSE-CAPILLARY: 197 mg/dL — AB (ref 65–99)

## 2015-03-20 NOTE — Progress Notes (Addendum)
Gerald Gomez  [name] [id number] DOB: 1941-03-07 DOA: 03/11/2015  PCP: [name], MD   Brief HPI: 74 year old African-American male with past medical history of idiopathic parkinsonism, mild dementia, diastolic heart failure, stage IV chronic kidney disease, thrombocytopenia, presented with complaints of shortness of breath. He was admitted to the hospital for possible community-acquired pneumonia. Also noted to have acute on chronic renal failure. Renal failure continued to get worse. Patient developed pulmonary edema requiring BiPAP. Patient thought to be a poor candidate for dialysis and had previously expressed a wish not to be dialyzed per his wife. In view of this palliative medicine was consulted.  Had detailed discussion with patient's wife bedside, I'm encouraging her to lean more towards full hospice as overall prognosis appears very poor. She will decide doing residential hospice and SNF with comfort care, She will let me know in the next 24-48 hours.   Acute on chronic respiratory failure with hypoxemia Pulmonary edema noted on chest x-ray 12/18. Significant change compared to previous films. This is most likely due to fluid overload from his kidney failure. Patient had to be placed on BiPAP.  There was also suspicion for pneumonia and it has been treated adequately with antibiotics, stopped all antibiotics on 03/18/2015. Has a shunt to oral Lasix as tolerated, goal of care now medical treatment directed towards comfort only no heroics. No dialysis. DO NOT RESUSCITATE.  Acute on chronic kidney failure with metabolic acidosis/hyperkalemia -Renal function continues to get worse. Nephrology is following. Also appreciated urology for placing Foley catheter. -Patient is now on high-dose Lasix.  -Renal ultrasound was nonacute, renal is following and he was on high-dose IV Lasix and will switch to oral Lasix. Renal function is not improving. Family has decided  against dialysis and in favor of comfort care. Prognosis appears poor I have encouraged the family to consider full hospice they will let us know in 24 hours. Remains DO NOT RESUSCITATE.  Anemia likely due to chronic kidney disease  He was transfused 1 unit of blood at the time of admission. Patient is followed by nephrology as an outpatient and apparently gets marrow stimulating agents. No overt bleeding noted. Continue to monitor.    Acute on Chronic Diastolic heart failure EF 70% on echogram. Continue high-dose IV Lasix as tolerated.  Hyperglycemia No documented history of diabetes.HbA1c is 6.1. Continue sliding scale coverage.  Idiopathic parkinsons with mild dementia Continue Sinemet IR.  Paroxysmal Atrial Fibrillation Currently in sinus rhythm. His episode of atrial fibrillation was back in August. CHADS2VASC his at least 4. However, patient has not been thought to be a candidate for anticoagulation due to compliance issues, thrombocytopenia. Continue Cardizem and metoprolol. Continue aspirin.  Chronic Thrombocytopenia Baseline appears to be in the 120s.Continue to monitor closely.No pharmacologic VTE prophylaxis. Stable.     DVT Prophylaxis: SCDs    Code Status: DNR Family Communication:  Care discussed with wife on 03/18/2015 and 03/19/2015 Disposition Plan: SNF with comfort care or residential hospice wife wants to take a few days to decide   Consultants: Nephrology. Palliative medicine  Procedures: None  Antibiotics:  Anti-infectives    Start     Dose/Rate Route Frequency Ordered Stop   03/13/15 1600  azithromycin (ZITHROMAX) tablet 500 mg  Status:  Discontinued     500 mg Oral Every 24 hours 03/13/15 0714 03/18/15 1249   03/12/15 1000  vancomycin (VANCOCIN) IVPB 1000 mg/200 mL premix  Status:  Discontinued     1,000 mg 200 mL/hr  over 60 Minutes Intravenous Every 24 hours 03/11/15 1332 03/11/15 1547   03/11/15 1630  azithromycin (ZITHROMAX) 500 mg in dextrose  5 % 250 mL IVPB  Status:  Discontinued     500 mg 250 mL/hr over 60 Minutes Intravenous Every 24 hours 03/11/15 1526 03/13/15 0714   03/11/15 1600  cefTRIAXone (ROCEPHIN) 1 g in dextrose 5 % 50 mL IVPB  Status:  Discontinued     1 g 100 mL/hr over 30 Minutes Intravenous Every 24 hours 03/11/15 1526 03/18/15 1249   03/11/15 0900  cefTAZidime (FORTAZ) 2 g in dextrose 5 % 50 mL IVPB  Status:  Discontinued     2 g 100 mL/hr over 30 Minutes Intravenous Every 24 hours 03/11/15 0855 03/11/15 1546   03/11/15 0845  vancomycin (VANCOCIN) IVPB 1000 mg/200 mL premix     1,000 mg 200 mL/hr over 60 Minutes Intravenous  Once 03/11/15 0835 03/11/15 1137       Subjective:  More sleepy, eyes close,  Getting more confused but in no distress.  Objective: Vital Signs  Filed Vitals:   03/19/15 1412 03/19/15 2147 03/20/15 0356 03/20/15 0500  BP: 127/73 138/77  133/71  Pulse: 120 120  128  Temp:  97.5 F (36.4 C)  97.4 F (36.3 C)  TempSrc:  Oral  Oral  Resp: 24 15  18   Height:      Weight:   104 kg (229 lb 4.5 oz)   SpO2: 97% 98%  100%    Intake/Output Summary (Last 24 hours) at 03/20/15 1025 Last data filed at 03/20/15 0501  Gross per 24 hour  Intake      0 ml  Output   1600 ml  Net  -1600 ml   Filed Weights   03/16/15 0409 03/17/15 0600 03/20/15 0356  Weight: 105.9 kg (233 lb 7.5 oz) 106.3 kg (234 lb 5.6 oz) 104 kg (229 lb 4.5 oz)    General appearance:  Sleepy, NAD Resp: Continues to have crackles at the bases. No rhonchi. No wheezing. Cardio: S1, S2 normal regular , no murmur, click, rub or gallop GI: soft, non-tender; bowel sounds normal; no masses,  no organomegaly Extremities: extremities normal, atraumatic, no cyanosis or edema  Lab Results:  Basic Metabolic Panel:  Recent Labs Lab 03/15/15 0240 03/16/15 0420 03/17/15 0306 03/18/15 0542 03/20/15 0630  NA 138 137 138 140 146*  K 4.1 4.4 4.8 4.6 4.9  CL 103 101 100* 101 105  CO2 19* 16* 17* 18* 17*  GLUCOSE 251*  273* 263* 191* 193*  BUN 171* 203* 236* 263* >300*  CREATININE 6.43* 6.81* 7.30* 7.36* 7.89*  CALCIUM 7.4* 7.1* 7.2* 7.3* 7.8*  PHOS 10.1* 11.3* 7.8* 12.9*  --    Liver Function Tests:  Recent Labs Lab 03/15/15 0240 03/16/15 0420 03/17/15 0306 03/18/15 0542  ALBUMIN 2.2* 2.1* 2.3* 2.1*   CBC:  Recent Labs Lab 03/14/15 0256 03/15/15 0240 03/16/15 0420 03/16/15 2123 03/17/15 0306 03/18/15 0542 03/18/15 1629  WBC 10.1 7.4 9.1  --  8.6 10.3  --   HGB 7.6* 7.2* 6.7* 7.4* 7.4* 6.6* 8.3*  HCT 24.5* 22.5* 21.1* 23.3* 22.7* 20.3* 25.2*  MCV 87.2 86.5 85.8  --  84.4 83.5  --   PLT 114* 109* 122*  --  113* 106*  --    Cardiac Enzymes: No results for input(s): CKTOTAL, CKMB, CKMBINDEX, TROPONINI in the last 168 hours. BNP (last 3 results)  Recent Labs  11/14/14 1601 11/17/14 0636 03/11/15 [id number]  BNP 2334.6* 1430.0* 1299.4*   CBG:  Recent Labs Lab 03/18/15 1636 03/19/15 0742 03/19/15 1202 03/19/15 1707 03/19/15 2144  GLUCAP 215* 183* 254* 191* 187*    Recent Results (from the past 240 hour(s))  Culture, blood (Routine X 2) w Reflex to ID Panel     Status: None   Collection Time: 03/11/15  8:50 AM  Result Value Ref Range Status   Specimen Description BLOOD RIGHT ANTECUBITAL  Final   Special Requests BOTTLES DRAWN AEROBIC AND ANAEROBIC 10CC  Final   Culture NO GROWTH 5 DAYS  Final   Report Status 03/16/2015 FINAL  Final  Culture, blood (Routine X 2) w Reflex to ID Panel     Status: None   Collection Time: 03/11/15  8:58 AM  Result Value Ref Range Status   Specimen Description BLOOD RIGHT HAND  Final   Special Requests BOTTLES DRAWN AEROBIC ONLY 10CC  Final   Culture NO GROWTH 5 DAYS  Final   Report Status 03/16/2015 FINAL  Final  MRSA PCR Screening     Status: None   Collection Time: 03/11/15  5:16 PM  Result Value Ref Range Status   MRSA by PCR NEGATIVE NEGATIVE Final    Comment:        The GeneXpert MRSA Assay (FDA approved for NASAL specimens only), is  one component of a comprehensive MRSA colonization surveillance program. It is not intended to diagnose MRSA infection nor to guide or monitor treatment for MRSA infections.       Studies/Results: No results found.  Medications:  Scheduled: . carbidopa-levodopa  1 tablet Oral TID  . cycloSPORINE  1 drop Both Eyes BID  . diltiazem  300 mg Oral Daily  . furosemide  80 mg Oral TID  . insulin aspart  0-9 Units Subcutaneous TID WC  . QUEtiapine  50 mg Oral QHS  . sodium chloride  3 mL Intravenous Q12H   Continuous:   [id number] **OR** acetaminophen, alum & mag hydroxide-simeth, glycopyrrolate, haloperidol, HYDROmorphone, levalbuterol, LORazepam, ondansetron **OR** ondansetron (ZOFRAN) IV, senna-docusate  Assessment/Plan:  Principal Problem:   Hypoxemia requiring supplemental oxygen Active Problems:   Hepatitis C   Thrombocytopenia (HCC)   Hypertensive heart disease with CHF (HCC)   PERIPHERAL VASCULAR DISEASE   LOW BACK PAIN, CHRONIC   Chronic diastolic heart failure (HCC)   Neurologic gait disorder   PD (Parkinson's disease) ([place])   Malignant neoplasm of prostate (HCC)   COPD (chronic obstructive pulmonary disease) (HCC)   Metabolic acidosis   CKD (chronic kidney disease) stage 4, GFR 15-29 ml/min (HCC)   PAF (paroxysmal atrial fibrillation) (HCC)   CAP (community acquired pneumonia)   Acute on chronic kidney failure (HCC)   Symptomatic anemia   Hyperglycemia   Palliative care encounter   ARF (acute renal failure) ([place])    LOS: 9 days   Travarius Lange K  [facility] Pager [phone]  03/20/2015, 10:25 AM  If 7PM-7AM, please contact night-coverage at [email or web address], password [facility]

## 2015-03-21 LAB — GLUCOSE, CAPILLARY
GLUCOSE-CAPILLARY: 197 mg/dL — AB (ref 65–99)
Glucose-Capillary: 232 mg/dL — ABNORMAL HIGH (ref 65–99)

## 2015-03-21 NOTE — Discharge Summary (Signed)
Gerald Gomez, is a 74 y.o. male  DOB 1940/06/07  MRN [id number].  Admission date:  03/11/2015  Admitting Physician  Kinnie Feil, MD  Discharge Date:  03/21/2015   Primary MD  [name], MD  Recommendations for primary care physician for things to follow:   Being discharged with hospice status goal of care is comfort   Admission Diagnosis  Dehydration [E86.0] Hypoxia [R09.02] CAP (community acquired pneumonia) [J18.9]   Discharge Diagnosis  Dehydration [E86.0] Hypoxia [R09.02] CAP (community acquired pneumonia) [J18.9]     Principal Problem:   Hypoxemia requiring supplemental oxygen Active Problems:   Hepatitis C   Thrombocytopenia (HCC)   Hypertensive heart disease with CHF ([place])   PERIPHERAL VASCULAR DISEASE   LOW BACK PAIN, CHRONIC   Chronic diastolic heart failure (HCC)   Neurologic gait disorder   PD (Parkinson's disease) ([place])   Malignant neoplasm of prostate (HCC)   COPD (chronic obstructive pulmonary disease) (HCC)   Metabolic acidosis   CKD (chronic kidney disease) stage 4, GFR 15-29 ml/min (HCC)   PAF (paroxysmal atrial fibrillation) ([place])   CAP (community acquired pneumonia)   Acute on chronic kidney failure ([place])   Symptomatic anemia   Hyperglycemia   Palliative care encounter   ARF (acute renal failure) ([place])      Past Medical History  Diagnosis Date  . ALLERGIC RHINITIS 03/28/2009  . ANXIETY 03/28/2009  . CHOLELITHIASIS 03/28/2009  . DEPRESSION 03/28/2009  . DIVERTICULOSIS, COLON 03/28/2009  . 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 FATIGUE 03/28/2009  . HYPERLIPIDEMIA 03/28/2009  . HYPERTENSION 03/28/2009  . LOW BACK PAIN, CHRONIC 03/28/2009  . PERIPHERAL VASCULAR DISEASE 03/28/2009  . Personal history of alcoholism ([place]) 03/28/2009  . THROMBOCYTOPENIA 03/28/2009  . Wheezing  02/07/2010  . CHF (congestive heart failure) ([place])   . Impaired glucose tolerance 12/01/2013  . Heart murmur   . Exertional dyspnea 11/04/2014  . History of stomach ulcers   . HEPATITIS C 03/28/2009  . DEGENERATIVE JOINT DISEASE 03/28/2009  . [place] DISEASE, LUMBAR 03/28/2009  . Arthritis     "everywhere"  . Chronic kidney disease, stage 3     /notes 11/04/2014  . Chronic anemia     [name] 11/04/2014  . Thrombocytopenia ([place])     chronic/notes 11/04/2014  . Malignant neoplasm prostate ([place])     "in treatment for it now" (11/04/2014)  . Pneumonia     Sepsis- suspecting /notes 11/04/2014    Past Surgical History  Procedure Laterality Date  . Shoulder arthroscopy w/ rotator cuff repair Right 2003  . Cataract extraction w/ intraocular lens  implant, bilateral Bilateral   . Prostate biopsy    . Colonoscopy w/ polypectomy  06/29/2014    polpys proved to be non cancerous done at [facility]       HPI  from the history and physical done on the day of admission:    74 year old African-American male with past medical history of idiopathic parkinsonism, mild dementia, diastolic heart failure, stage IV chronic kidney disease, thrombocytopenia, presented  with complaints of shortness of breath. He was admitted to the hospital for possible community-acquired pneumonia. Also noted to have acute on chronic renal failure. Renal failure continued to get worse. Patient developed pulmonary edema requiring BiPAP. Patient thought to be a poor candidate for dialysis and had previously expressed a wish not to be dialyzed per his wife. In view of this palliative medicine was consulted.  Had detailed discussion with patient's wife bedside, I'm encouraging her to lean more towards full hospice as overall prognosis appears very poor. She will decide doing residential hospice and SNF with comfort care, She will let me know in the next 24-48 hours.    Hospital Course:     Acute on chronic respiratory failure with  hypoxemia Pulmonary edema noted on chest x-ray 12/18. Significant change compared to previous films. This is most likely due to fluid overload from his kidney failure. Patient had to be placed on BiPAP. There was also suspicion for pneumonia and it has been treated adequately with antibiotics, stopped all antibiotics on 03/18/2015. Continue oral Lasix as tolerated, goal of care now medical treatment directed towards comfort only no heroics. No dialysis. DO NOT RESUSCITATE. Full Hospice.  Acute on chronic kidney failure with metabolic acidosis/hyperkalemia -Renal function continues to get worse. Nephrology is following. Also appreciated urology for placing Foley catheter. Patient is now on high-dose Lasix.  -Renal ultrasound was nonacute, renal is following and he was on high-dose IV Lasix and will switch to oral Lasix. Renal function is not improving. Family has decided against dialysis and in favor of comfort care. Prognosis is poor I with Pall care had encouraged the family to consider full hospice he remains DO NOT RESUSCITATE, Full Hospice now.  Anemia likely due to chronic kidney disease  He was transfused 1 unit of blood at the time of admission. Patient is followed by nephrology as an outpatient and apparently gets marrow stimulating agents. No overt bleeding noted. Continue to monitor.  Acute on Chronic Diastolic heart failure EF 70% on echogram. Continue Lasix as tolerated  Hyperglycemia No documented history of diabetes.HbA1c is 6.1. No Rx now.  Idiopathic parkinsons with mild dementia Continue Sinemet IR.  Paroxysmal Atrial Fibrillation Currently in sinus rhythm. His episode of atrial fibrillation was back in August. CHADS2VASC his at least 4. However, patient has not been thought to be a candidate for anticoagulation due to compliance issues, thrombocytopenia. Now Supportive Rx only.  Chronic Thrombocytopenia Baseline appears to be in the 120s. Stable.      Discharge  Condition: Guarded  Follow UP  Follow-up Information    Follow up with [name], MD.   Specialties:  Internal Medicine, Radiology   Why:  As needed   Contact information:   [place] [id number] [phone]        Consults obtained - [place], Renal  Diet and Activity recommendation: See Discharge Instructions below  Discharge Instructions       Discharge Instructions    Discharge instructions    Complete by:  As directed   Diet as tolerated with full feeding assistance and aspiration precautions  Activity as tolerated with assistance and fall precautions  Prognosis is guarded, goal of care is comfort. Full hospice.  Disposition. Residential hospice/SNF with comfort measures             Discharge Medications       Medication List    STOP taking these medications  aspirin 81 MG chewable tablet     gabapentin 100 MG capsule  Commonly known as:  NEURONTIN     ibuprofen 800 MG tablet  Commonly known as:  ADVIL,MOTRIN      TAKE these medications        albuterol 108 (90 BASE) MCG/ACT inhaler  Commonly known as:  PROVENTIL HFA;VENTOLIN HFA  Inhale 2 puffs into the lungs every 6 (six) hours as needed for wheezing or shortness of breath. Dispense Ventolin HFA please     carbidopa-levodopa 25-100 MG tablet  Commonly known as:  SINEMET IR  Take 1 tablet by mouth 3 (three) times daily.     citalopram 10 MG tablet  Commonly known as:  CELEXA  Take 1 tablet (10 mg total) by mouth daily.     cycloSPORINE 0.05 % ophthalmic emulsion  Commonly known as:  RESTASIS  Place 1 drop into both eyes 2 (two) times daily.     diltiazem 300 MG 24 hr capsule  Commonly known as:  CARDIZEM CD  Take 1 capsule (300 mg total) by mouth daily.     fluticasone 50 MCG/ACT nasal spray  Commonly known as:  FLONASE  Place 2 sprays into both nostrils daily.     furosemide 40 MG tablet  Commonly known as:  LASIX  Take 2 tablets (80 mg total) by  mouth 2 (two) times daily.     haloperidol 5 MG tablet  Commonly known as:  HALDOL  Take 1 tablet (5 mg total) by mouth every 6 (six) hours as needed for agitation.     HYDROmorphone HCl 1 MG/ML Liqd  Commonly known as:  DILAUDID  Take 1 mL (1 mg total) by mouth every 2 (two) hours as needed for moderate pain or severe pain.     LORazepam 2 MG/ML concentrated solution  Commonly known as:  ATIVAN  Take 0.5 mLs (1 mg total) by mouth every 6 (six) hours as needed for anxiety.     metoprolol tartrate 25 MG tablet  Commonly known as:  LOPRESSOR  take 1 tablet by mouth twice a day     QUEtiapine 100 MG tablet  Commonly known as:  SEROQUEL  Take 1 tablet (100 mg total) by mouth at bedtime.     tamsulosin 0.4 MG Caps capsule  Commonly known as:  FLOMAX  Take 1 capsule (0.4 mg total) by mouth daily.        Major procedures and Radiology Reports - PLEASE review detailed and final reports for all details, in brief -       Dg Chest 2 View  03/11/2015  CLINICAL DATA:  Shortness of Breath with exertion EXAM: CHEST  2 VIEW COMPARISON:  November 19, 2014 FINDINGS: There is slight atelectasis in the left base. There is no edema or consolidation. Heart is upper normal in size with pulmonary vascularity within normal limits. No adenopathy. No bone lesions. IMPRESSION: Atelectatic change left base, mild.  No edema or consolidation. Electronically Signed   By: Lowella Grip III M.D.   On: 03/11/2015 07:55   Ct Chest Wo Contrast  03/11/2015  CLINICAL DATA:  Progressive shortness of breath/dyspnea over the past 2 days. History of congestive heart failure. EXAM: CT CHEST WITHOUT CONTRAST TECHNIQUE: Multidetector CT imaging of the chest was performed following the standard protocol without IV contrast. COMPARISON:  Multiple exams, including chest radiograph of 03/11/2015 FINDINGS: Mediastinum/Nodes: Coronary, aortic arch, and branch vessel atherosclerotic vascular disease. Low-density blood pool.   Mild cardiomegaly.  No thoracic adenopathy. Lungs/Pleura: Patchy bilateral perihilar ground-glass opacities are present. Faint mosaic attenuation. Mild centrilobular emphysema. Cylindrical bronchiectasis in the right upper lobe. No honeycombing or significant peripheral fibrosis. Upper abdomen: Borderline enlarged lymph nodes along the porta hepatis, images 55-57 series 2. Musculoskeletal: Degenerative right sternoclavicular arthropathy with prominent spurring and nitrogen gas phenomenon. Incomplete bridging medially between the left ninth and tenth ribs shown on image 79 series 5. IMPRESSION: 1. Patchy bilateral ground-glass opacities in the lungs, primarily perihilar distribution. No definite thickening of the secondary pulmonary lobular septa. Faint mosaic attenuation. I favor infiltrative cause such as edema, hypersensitivity pneumonitis, or less likely nonspecific interstitial pneumonitis. 2. Mild cardiomegaly. 3. Low-density blood pool suggests anemia. 4. Mild centrilobular emphysema. 5. Borderline enlarged lymph node in the porta hepatis, relatively similar appearance in 2005 hands probably incidental. Electronically Signed   By: Van Clines M.D.   On: 03/11/2015 11:30   US Renal  03/13/2015  CLINICAL DATA:  Acute renal failure. Chronic kidney disease, stage III. Hypertension. EXAM: RENAL / URINARY TRACT ULTRASOUND COMPLETE COMPARISON:  None. FINDINGS: Right Kidney: Length: 11.3 cm. Right renal cortex is echogenic but normal in thickness. No renal mass, cyst, stone or hydronephrosis. Left Kidney: Length: 11.4 cm. Left renal cortex is also echogenic but normal in thickness. No renal mass, cyst, stone or hydronephrosis. Bladder: Bladder wall appears mildly thickened but this is likely related to partial decompression. IMPRESSION: 1. Both kidneys appear echogenic compatible with the given history of chronic medical renal disease. No mass, cyst, stone or hydronephrosis bilaterally. 2. Bladder walls  appear mildly thickened but this is likely related to the partial bladder decompression. Electronically Signed   By: Franki Cabot M.D.   On: 03/13/2015 13:00   Dg Chest Port 1 View  03/13/2015  CLINICAL DATA:  Community acquired pneumonia with dyspnea EXAM: PORTABLE CHEST - 1 VIEW COMPARISON:  03/11/2015 FINDINGS: Cardiac shadow is stable. Increasing infiltrative densities are noted bilaterally particularly in the right upper lobe. Given the appearance on recent CT examination this likely represents progression of pulmonary edema. No bony abnormality is noted. IMPRESSION: Progressive infiltrates bilaterally but particularly notable in the right upper lobe. Pulmonary edema is again favored. Electronically Signed   By: Inez Catalina M.D.   On: 03/13/2015 15:04    Micro Results      Recent Results (from the past 240 hour(s))  MRSA PCR Screening     Status: None   Collection Time: 03/11/15  5:16 PM  Result Value Ref Range Status   MRSA by PCR NEGATIVE NEGATIVE Final    Comment:        The GeneXpert MRSA Assay (FDA approved for NASAL specimens only), is one component of a comprehensive MRSA colonization surveillance program. It is not intended to diagnose MRSA infection nor to guide or monitor treatment for MRSA infections.    Today   Subjective    Gerald Gomez today has no headache,no chest abdominal pain,no new weakness tingling or numbness, feels much better wants to go home today.    Objective   Blood pressure 136/47, pulse 101, temperature 97.6 F (36.4 C), temperature source Axillary, resp. rate 18, height 6' 0.5" (1.842 m), weight 105.7 kg (233 lb 0.4 oz), SpO2 98 %.   Intake/Output Summary (Last 24 hours) at 03/21/15 0911 Last data filed at 03/20/15 1500  Gross per 24 hour  Intake      0 ml  Output    750 ml  Net   -750 ml  Exam Awake Alert, Oriented x 3, No new F.N deficits, Normal affect [place].AT,PERRAL Supple Neck,No JVD, No cervical lymphadenopathy  appriciated.  Symmetrical Chest wall movement, Good air movement bilaterally, CTAB RRR,No Gallops,Rubs or new Murmurs, No Parasternal Heave +ve B.Sounds, Abd Soft, Non tender, No organomegaly appriciated, No rebound -guarding or rigidity. No Cyanosis, Clubbing or edema, No new Rash or bruise   Data Review   CBC w Diff: Lab Results  Component Value Date   WBC 10.3 03/18/2015   WBC 5.1 11/23/2008   HGB 8.3* 03/18/2015   HGB 14.4 11/23/2008   HCT 25.2* 03/18/2015   HCT 43.1 11/23/2008   PLT 106* 03/18/2015   PLT 73* 11/23/2008   LYMPHOPCT 4 03/11/2015   LYMPHOPCT 47.5 11/23/2008   MONOPCT 12 03/11/2015   MONOPCT 12.2 11/23/2008   EOSPCT 0 03/11/2015   EOSPCT 2.9 11/23/2008   BASOPCT 0 03/11/2015   BASOPCT 0.2 11/23/2008    CMP: Lab Results  Component Value Date   NA 146* 03/20/2015   K 4.9 03/20/2015   CL 105 03/20/2015   CO2 17* 03/20/2015   BUN >300* 03/20/2015   CREATININE 7.89* 03/20/2015   PROT 7.4 03/12/2015   ALBUMIN 2.1* 03/18/2015   BILITOT 0.5 03/12/2015   ALKPHOS 68 03/12/2015   AST 36 03/12/2015   ALT 6* 03/12/2015  .   Total Time in preparing paper work, data evaluation and todays exam - 35 minutes  Thurnell Lose M.D on 03/21/2015 at [facility]   Office  [phone]

## 2015-03-21 NOTE — Progress Notes (Signed)
[facility] Liaison: RN visit. Received request from [place] for family interest in [facility] with request to transfer today. Chart reviewed and report received from RN. Met with patient and his wife, [name] to confirm interest and explain services. Wife agreeable to transfer today. CSW aware. Reistration paper work completed. Dr. Orpah Melter to assume care per family request. Please fax discharge summary to [phone]. RN please call report to [phone]. Please arrange transport by [facility]. Thank you.  [facility] Liaison [phone]

## 2015-03-21 NOTE — Discharge Instructions (Signed)
Diet as tolerated with full feeding assistance and aspiration precautions  Activity as tolerated with assistance and fall precautions  Prognosis is guarded, goal of care is comfort. Full hospice.  Disposition. Residential hospice/SNF with comfort measures

## 2015-03-21 NOTE — Progress Notes (Signed)
CSW notified earlier this morning of Palliative's recommendation for residential hospice. CSW discussed referral with Lattie Haw with [facility].  Referral completed for [facility] and be offer made.  CSW discussed with patient's wife  [name] who was very pleased with the bed offer; she completed admission papers with [facility].  Patient would not open his eyes and wife states that he will not eat anything; just holds food in his mouth. Discussed residential services and wife verbalized appreciation for this service and denied any questions or concerns.  EMS arranged; nursing notified to call report and DC summary was sent to [facility].  No further CSW needs identified. CSW signing off.  Lorie Phenix. Pauline Good, [place]

## 2015-03-22 ENCOUNTER — Telehealth: Payer: Self-pay

## 2015-03-27 NOTE — Telephone Encounter (Signed)
Pt is on TCM list. Pt has been dc to Hospice.

## 2015-03-27 DEATH — deceased

## 2015-06-21 ENCOUNTER — Ambulatory Visit: Payer: Medicare Other | Admitting: Neurology

## 2017-04-18 IMAGING — CT CT HEAD W/O CM
3 of 6 series · 14 of 47 positions shown, 16 images · non-contrast
Comparison: Prior CT head and cervical spine 08/11/2013

CLINICAL DATA: 74-year-old male with intermittent stroke-like
symptoms and recent fall

EXAM:
CT HEAD WITHOUT CONTRAST
CT CERVICAL SPINE WITHOUT CONTRAST
TECHNIQUE: Multidetector CT imaging of the head and cervical spine was
performed following the standard protocol without intravenous
contrast. Multiplanar CT image reconstructions of the cervical spine
were also generated.

[Series 302: soft tissue, idose (2) · axial · 0.39mm/px · z∈[+68,+230]mm · 8 of 105 slices shown, 10 images]
[im 12/105  brain]
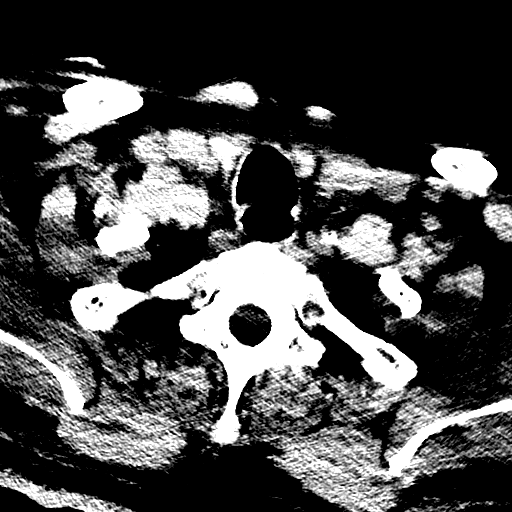
[im 12/105  bone]
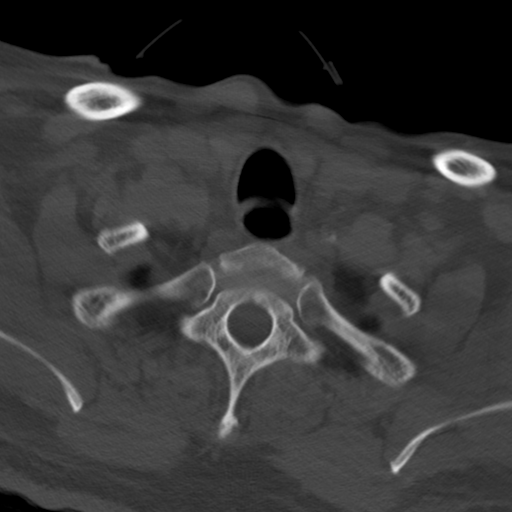
[im 24/105  brain]
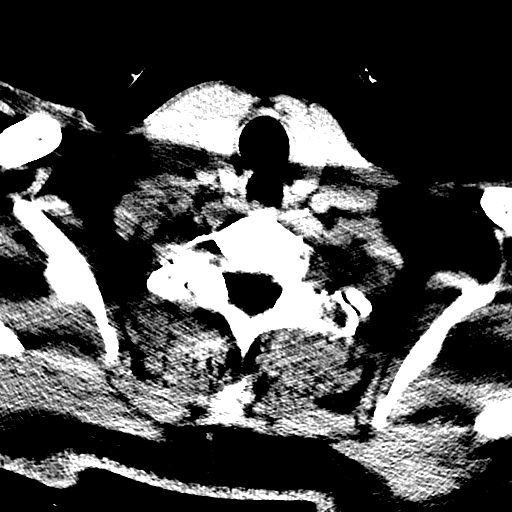
[im 35/105  brain]
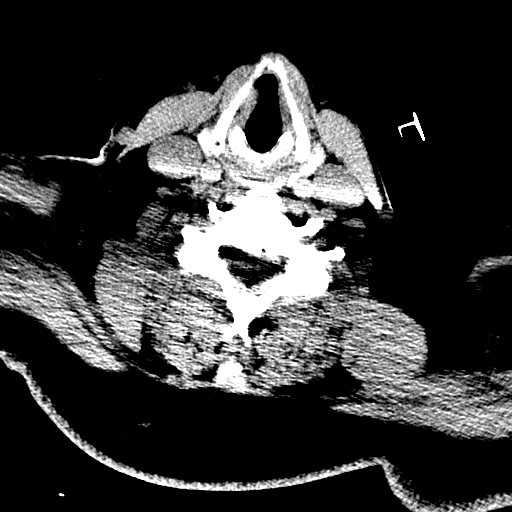
[im 47/105  brain]
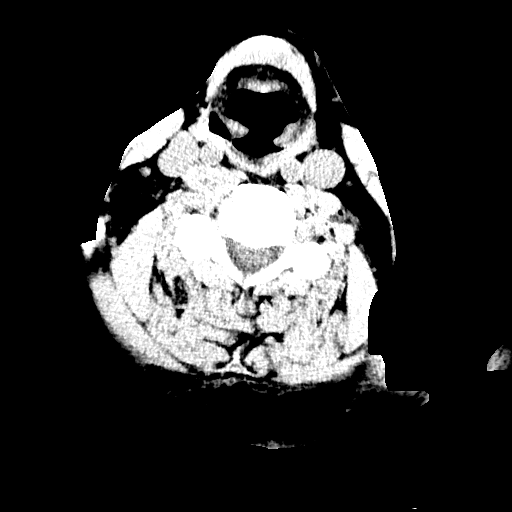
[im 58/105  brain]
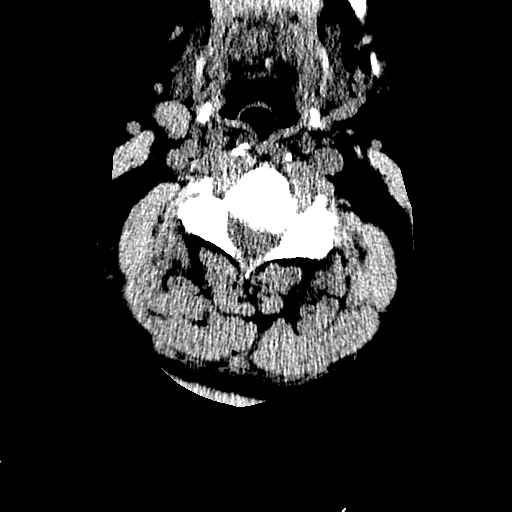
[im 58/105  bone]
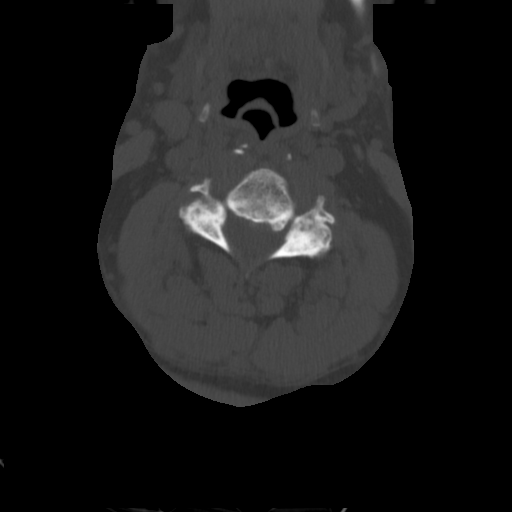
[im 70/105  brain]
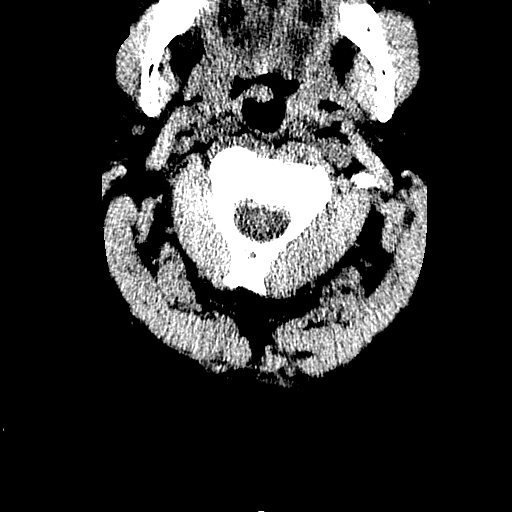
[im 81/105  brain]
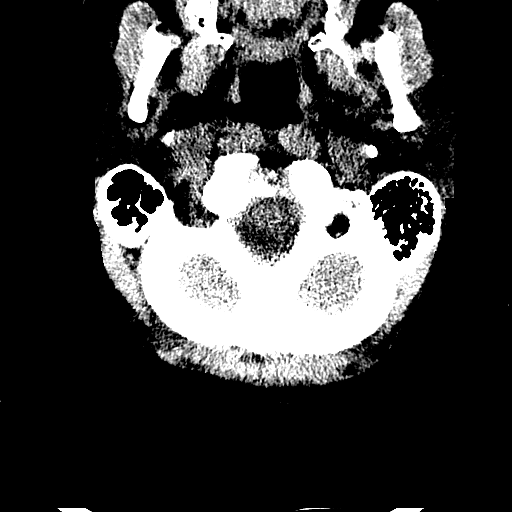
[im 93/105  brain]
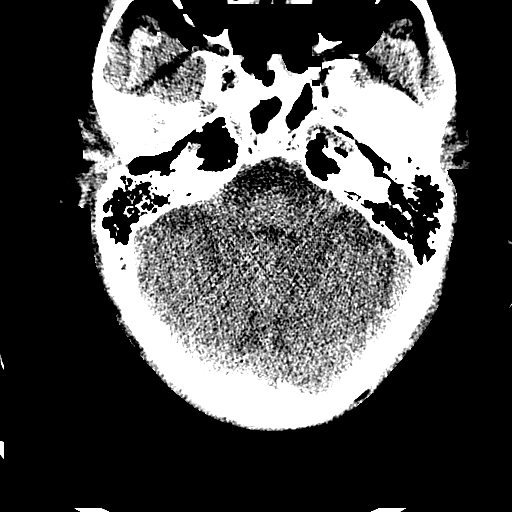

[Series 304: coronal, idose (2) · coronal · 0.35mm/px · 3 of 61 slices shown]
[im 21/61  brain]
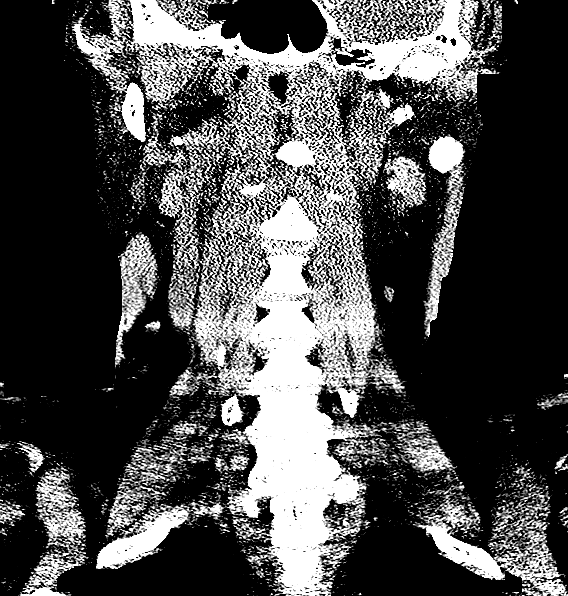
[im 27/61  brain]
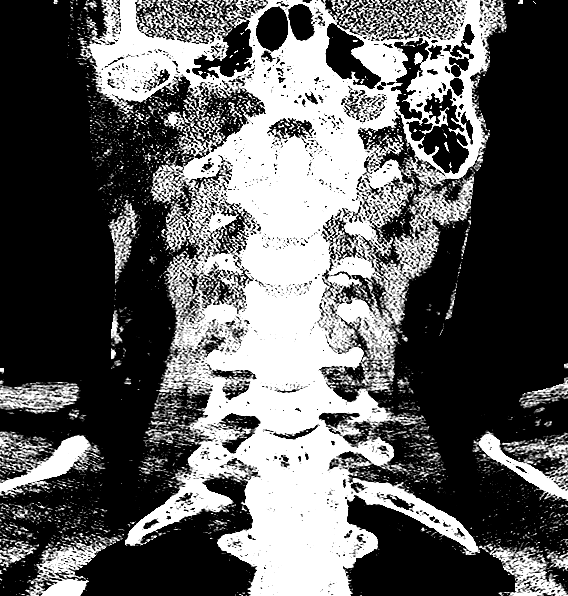
[im 34/61  brain]
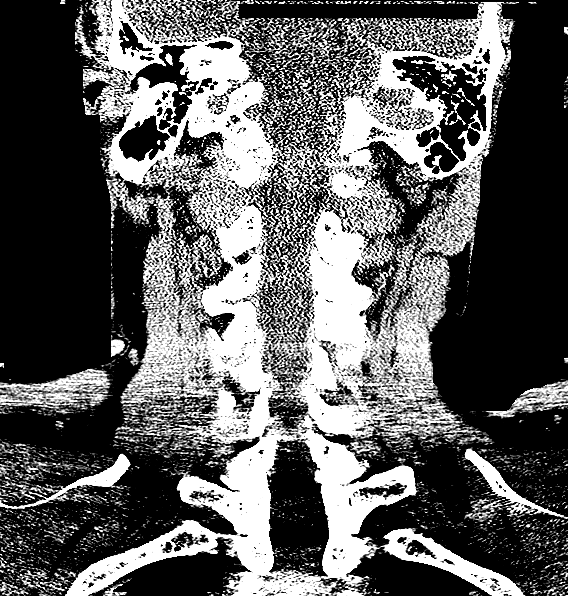

[Series 305: sagittal, idose (2) · sagittal · 0.34mm/px · 3 of 74 slices shown]
[im 25/74  brain]
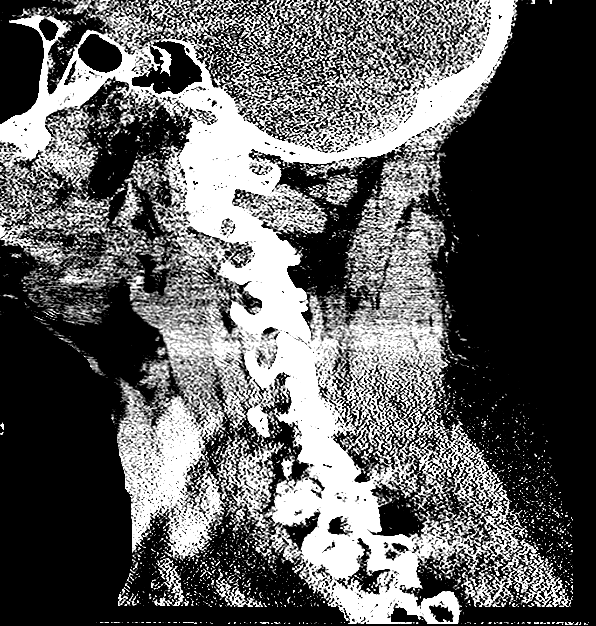
[im 37/74  brain]
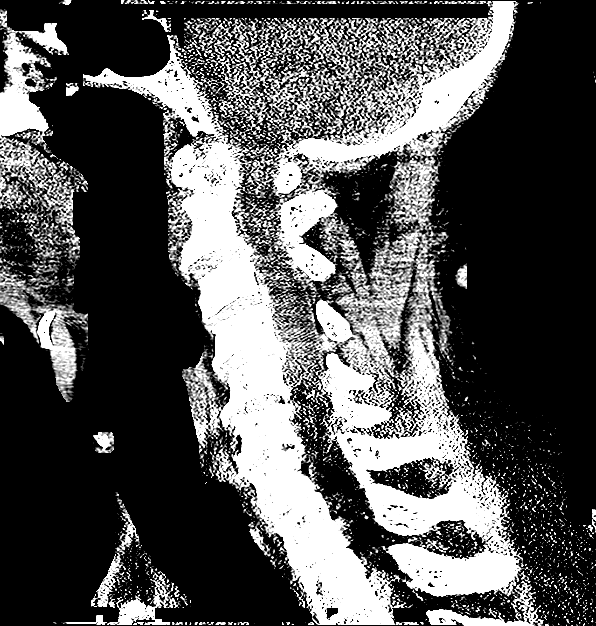
[im 49/74  brain]
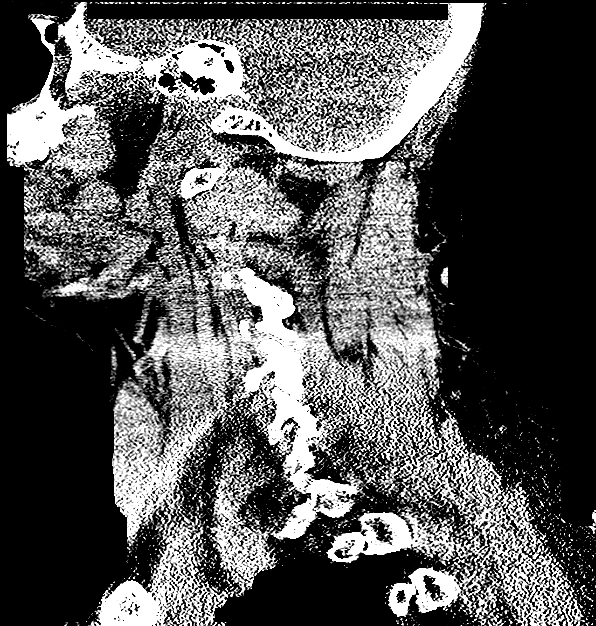

[14 of 47 positions shown; findings below may reference images not displayed]

FINDINGS: CT HEAD FINDINGS

Negative for acute intracranial hemorrhage, acute infarction, mass,
mass effect, hydrocephalus or midline shift. Gray-white
differentiation is preserved throughout. No focal soft tissue or
calvarial abnormality. Globes and orbits are symmetric and intact
bilaterally. Normal aeration of the mastoid air cells and paranasal
sinuses.

CT CERVICAL SPINE FINDINGS

No acute fracture, malalignment or prevertebral soft tissue
swelling. Degenerative change at the atlantodental interval with
partial ossification of the cruciate ligaments. Multilevel
degenerative disc disease with calcification of the anterior and
posterior annulus fibrosis at multiple levels. Mild degenerative
endplate spurring. Mild multilevel facet arthropathy. Unremarkable
CT appearance of the thyroid gland. No acute soft tissue
abnormality. Emphysematous change in the visualized upper lobes.
Atherosclerotic calcifications in both carotid bifurcations.
Degenerative change at the right sternoclavicular junction similar
compared to Wednesday July, 2013.
IMPRESSION: CT HEAD

1. Negative
CT CSPINE

1. No acute fracture or malalignment.
2. Multilevel cervical spondylosis and bilateral facet arthropathy.
3. Stable degenerative change at the right sternoclavicular joint.
